# Patient Record
Sex: Male | Born: 1965 | Race: White | Hispanic: No | Smoking: Never smoker
Health system: Southern US, Community
[De-identification: ages and names within clinical notes are randomized; demographics above are authoritative.]

## PROBLEM LIST (undated history)

## (undated) DIAGNOSIS — R112 Nausea with vomiting, unspecified: Secondary | ICD-10-CM

## (undated) DIAGNOSIS — J40 Bronchitis, not specified as acute or chronic: Secondary | ICD-10-CM

## (undated) DIAGNOSIS — G971 Other reaction to spinal and lumbar puncture: Secondary | ICD-10-CM

## (undated) DIAGNOSIS — R079 Chest pain, unspecified: Secondary | ICD-10-CM

## (undated) DIAGNOSIS — M1990 Unspecified osteoarthritis, unspecified site: Secondary | ICD-10-CM

## (undated) DIAGNOSIS — I10 Essential (primary) hypertension: Secondary | ICD-10-CM

## (undated) DIAGNOSIS — M009 Pyogenic arthritis, unspecified: Secondary | ICD-10-CM

## (undated) DIAGNOSIS — G709 Myoneural disorder, unspecified: Secondary | ICD-10-CM

## (undated) DIAGNOSIS — T4145XA Adverse effect of unspecified anesthetic, initial encounter: Secondary | ICD-10-CM

## (undated) DIAGNOSIS — F419 Anxiety disorder, unspecified: Secondary | ICD-10-CM

## (undated) DIAGNOSIS — T7840XA Allergy, unspecified, initial encounter: Secondary | ICD-10-CM

## (undated) DIAGNOSIS — T8859XA Other complications of anesthesia, initial encounter: Secondary | ICD-10-CM

## (undated) DIAGNOSIS — Z9889 Other specified postprocedural states: Secondary | ICD-10-CM

## (undated) HISTORY — DX: Chest pain, unspecified: R07.9

## (undated) HISTORY — DX: Pyogenic arthritis, unspecified: M00.9

## (undated) HISTORY — PX: ULNAR NERVE REPAIR: SHX2594

## (undated) HISTORY — DX: Allergy, unspecified, initial encounter: T78.40XA

## (undated) HISTORY — PX: APPENDECTOMY: SHX54

## (undated) HISTORY — DX: Unspecified osteoarthritis, unspecified site: M19.90

## (undated) HISTORY — PX: SHOULDER SURGERY: SHX246

## (undated) HISTORY — PX: KNEE SURGERY: SHX244

## (undated) HISTORY — PX: CHOLECYSTECTOMY: SHX55

## (undated) NOTE — *Deleted (*Deleted)
CRITICAL VALUE ALERT  Critical Value: Hgb 6.5   Date & Time Notified:  01/20/19 4:35  Provider Notified: Shauna Hugh, MD  Orders Received/Actions taken:

---

## 2007-11-30 ENCOUNTER — Ambulatory Visit (HOSPITAL_BASED_OUTPATIENT_CLINIC_OR_DEPARTMENT_OTHER): Admission: RE | Admit: 2007-11-30 | Discharge: 2007-12-01 | Payer: Self-pay | Admitting: Orthopaedic Surgery

## 2007-12-09 ENCOUNTER — Inpatient Hospital Stay (HOSPITAL_COMMUNITY): Admission: EM | Admit: 2007-12-09 | Discharge: 2007-12-14 | Payer: Self-pay | Admitting: Emergency Medicine

## 2007-12-21 ENCOUNTER — Ambulatory Visit: Payer: Self-pay | Admitting: Internal Medicine

## 2010-07-30 NOTE — Op Note (Signed)
NAMEAMEL, GIANINO                ACCOUNT NO.:  [id number]   MEDICAL RECORD NO.:  [id number]          PATIENT TYPE:  INP   LOCATION:  5007                         FACILITY:  [facility]   PHYSICIAN:  Lubertha Basque. Dalldorf, M.D.DATE OF BIRTH:  [birth year]   DATE OF PROCEDURE:  12/10/2007  DATE OF DISCHARGE:                               OPERATIVE REPORT   PREOPERATIVE DIAGNOSIS:  Left shoulder infection.   POSTOPERATIVE DIAGNOSIS:  Left shoulder infection.   PROCEDURE:  Left shoulder arthroscopic incision and drainage.   ANESTHESIA:  General.   ATTENDING SURGEON:  Lubertha Basque. Jerl Santos, MD   ASSISTANT:  Lindwood Qua, PA   INDICATIONS FOR PROCEDURE:  The patient is a 45 year old man over a week  from a shoulder arthroscopy.  He was seen in the office for a few days  with rash and malaise.  He has been afebrile in the office with rash on  some days and no rash other days.  He has always maintained a good  motion of the shoulder, which never really appeared infected.  He  experienced fever at home yesterday and came to the emergency room.  There was suspicion of an infection and [name] placed a needle from  the posterior aspect of the shoulder and aspirated some material.  This  was sent to the lab and has shown white cells, but no organisms.  It is  presumed that he may have an infection and he is offered an irrigation  and debridement through the scope in hopes of taking care of this  problem quickly.  Informed operative consent was obtained after  discussion of possible complications of reaction to anesthesia and  obviously continued infection.   DESCRIPTION OF PROCEDURE:  The patient was taken to the OR suite where  general anesthesia was applied without complication.  He was positioned  in beach chair position and prepped and draped in normal sterile  fashion.  We then performed arthroscopy through his three old portals.  I found some pus in the subcutaneous position of the  posterior portal,  but the glenohumeral joint and subacromial spaces really seemed free of  infection.  Nevertheless, I irrigated with 6 liters of sterile solution.  We placed drains both glenohumeral and subacromial and these were  sutured in place.  He was admitted for appropriate postop care to  include continued vancomycin, which was started last night.  We would  also get an Infectious Disease consult to help in his management.  We  will also obviously monitor his cultures, which were taken last night at  admission.  I contacted his girlfriend, Leta Jungling, after the case at [phone].  We will also contact his rehabilitation nurse that she is aware,  though she likely is already abreast of the situation.     Lubertha Basque Jerl Santos, M.D.  Electronically Signed    PGD/MEDQ  D:  12/10/2007  T:  12/11/2007  Job:  [id number]

## 2010-07-30 NOTE — Discharge Summary (Signed)
NAMESTEWART, SASAKI                ACCOUNT NO.:  [id number]   MEDICAL RECORD NO.:  [id number]          PATIENT TYPE:  INP   LOCATION:  5007                         FACILITY:  [facility]   PHYSICIAN:  Lubertha Basque. Dalldorf, M.D.DATE OF BIRTH:  Nov 01, 1965   DATE OF ADMISSION:  12/09/2007  DATE OF DISCHARGE:  12/14/2007                               DISCHARGE SUMMARY   ADMITTING DIAGNOSIS:  Left shoulder status post arthroscopy with  infection.   DISCHARGE DIAGNOSIS:  Left shoulder status post arthroscopy with  infection.   OPERATIONS:  Incision and drainage of left shoulder incisions.   BRIEF HISTORY:  Mr. Jason [name] is a 45 year old white male patient who we had  operated on his left shoulder with an acromioplasty.  About a week prior  to his admission to this hospital, he was having increasing discomfort,  redness, pain, and swelling and presented to the emergency room at which  time, there was some pus that was aspirated from his shoulder, and we  have admitted him to the hospital for IV antibiotics and I&D treatment  of his left shoulder wound.   PERTINENT LABORATORY AND X-RAY FINDINGS:  WBCs 12 down from 16.3,  hemoglobin 13, and platelets 200.  Sodium 137, potassium 3.7, BUN 14,  creatinine 1.96, and glucose 116.  He was admitted to the hospital and  the next day taken to the operating room for I&D.  Postoperatively, we  placed him on a variety of p.o. and IM analgesics for pain, IV  vancomycin, and additional appropriate medications for nausea and muscle  spasm were given to the patient.  He had 2 large bore Hemovac drains in  place, were left in for 3 days, changed his dressing, and pulled the  drains on the third day.  Wounds were noted to be benign.  No sign of  infection at that point or irritation.  Dr. Orvan Falconer from Infectious  Disease consulted on the patient and had suggested either p.o. Avelox or  Levaquin on discharge.   His condition on discharge is improved.   FOLLOWUP:   Given 2 prescriptions, one for Avelox 400 mg 30 pills 1 p.o.  daily, continue his Xanax which was 0.25 one p.r.n., and Vicodin #60 one  p.o. q.4-6 h. p.r.n. pain.  Return to our office in 7 days for a  recheck, keep this area clean and dry with new dressings.  Low-sodium,  heart-healthy diet and continue in a sling.  Remain out of work.      Lindwood Qua, P.A.      Lubertha Basque Jerl Santos, M.D.  Electronically Signed   MC/MEDQ  D:  12/14/2007  T:  12/14/2007  Job:  [id number]

## 2010-07-30 NOTE — Op Note (Signed)
NAMETERRELL, OSTRAND                ACCOUNT NO.:  [id number]   MEDICAL RECORD NO.:  [id number]          PATIENT TYPE:  AMB   LOCATION:  DSC                          FACILITY:  [facility]   PHYSICIAN:  Lubertha Basque. Dalldorf, M.D.DATE OF BIRTH:  Aug 17, 1965   DATE OF PROCEDURE:  11/30/2007  DATE OF DISCHARGE:                               OPERATIVE REPORT   PREOPERATIVE DIAGNOSES:  1. Left shoulder impingement.  2. Left shoulder possible labral tear.   POSTOPERATIVE DIAGNOSES:  1. Left shoulder impingement.  2. Left shoulder labral tear.   PROCEDURE:  1. Left shoulder arthroscopic acromioplasty.  2. Left shoulder arthroscopic debridement.   ANESTHESIA:  General and block.   ATTENDING SURGEON:  Lubertha Basque. Jerl Santos, MD   ASSISTANT:  Lindwood Qua, PA   INDICATIONS FOR PROCEDURE:  The patient is a 45 year old man with a  history of an injury to his shoulder several years back.  This responded  to arthroscopic surgery elsewhere.  Unfortunately, he was involved on  the job in a physical encounter with the patient, who twisted his arm.  He has felt terrible pain since that time despite immobilization and a  sling, oral anti-inflammatories, and an injection.  By MRI scan, things  looked relatively benign.  Nevertheless, he persists with terrible pain  which makes it impossible for him to rest or use his arm or work.  He is  offered arthroscopic evaluation and treatment.  Informed operative  consent was obtained after discussion of possible complications of  reaction to anesthesia and infection.   SUMMARY FINDINGS AND PROCEDURE:  Under general anesthesia and a block, a  left shoulder arthroscopy was performed.  Glenohumeral joint showed no  degenerative changes and the rotator cuff appeared benign from below.  The subscapularis appeared benign.  The labral structures anterior-  posterior appeared completely benign and were both probed.  His superior  labrum appeared to have had a repair  performed.  It looked as though he  had one anchor on the anterior aspect of the superior labrum.  This  appeared to be Ethibond and seemed to have the labrum secured well to  the top of the glenoid.  This did not displace down into the joint.  There was some mild fraying and I addressed this with a debridement.  In  the subacromial space, he had some mild inflammation of the rotator cuff  and some partial-thickness tear, I addressed with a thorough  debridement.  I performed a revision acromioplasty removing a slightly  greater portion of bone.   DESCRIPTION OF PROCEDURE:  The patient was taken to the operating suite  where general anesthetic was applied without difficulty.  He was also  given a block in the preanesthesia area.  He was positioned in beach-  chair position and prepped, draped in normal sterile fashion.  After the  administration of IV Kefzol, an arthroscopy of left shoulder was  performed through a total of three portals.  Findings were as noted  above.  Procedure consisted of a thorough evaluation of his superior  labrum with a debridement of some torn  tissues.  This certainly was not  unstable and no further repair was required.  The rotator cuff was  examined quite closely from above and below, and no significant tear was  seen.  A brief debridement was done on the bursal aspect followed by a  slight revision acromioplasty.  The acromioplasty was revised with a bur  in the lateral position followed by transfer of the bur to posterior  position.  The shoulder was thoroughly irrigated followed by anesthesia  records.   DISPOSITION:  The patient was extubated in the operating room and taken  to recovery room in stable addition.  He is to go home on same day and  followup with me in the office next week.  I will contact him by phone  tonight.   ADDENDUM: Tommy had difficulty with pain and nausea and respiration and  subsequently was admitted for overnight observation.   He remained stable  throughout and I discharged him home in the morning.      Lubertha Basque Jerl Santos, M.D.  Electronically Signed     PGD/MEDQ  D:  11/30/2007  T:  12/01/2007  Job:  [id number]

## 2010-12-16 LAB — POCT I-STAT, CHEM 8
BUN: 21
Chloride: 106
Creatinine, Ser: 1.1
Potassium: 4.3
Sodium: 137
TCO2: 24

## 2010-12-16 LAB — BASIC METABOLIC PANEL
Calcium: 9.6
GFR calc non Af Amer: >60
Glucose, Bld: 116 — ABNORMAL HIGH
Potassium: 3.7
Sodium: 137

## 2010-12-16 LAB — VANCOMYCIN, TROUGH: Vancomycin Tr: <5 — ABNORMAL LOW

## 2010-12-16 LAB — BODY FLUID CULTURE: Culture: NO GROWTH

## 2010-12-16 LAB — DIFFERENTIAL
Basophils Absolute: 0.1
Eosinophils Relative: 1
Lymphocytes Relative: 10 — ABNORMAL LOW
Lymphs Abs: 1.6
Neutro Abs: 13.7 — ABNORMAL HIGH

## 2010-12-16 LAB — CBC
HCT: 47.3
MCHC: 33.7
Platelets: 200
Platelets: 224
RDW: 14.7
WBC: 16.3 — ABNORMAL HIGH

## 2010-12-16 LAB — POCT HEMOGLOBIN-HEMACUE: Hemoglobin: 15.3

## 2010-12-16 LAB — HIV ANTIBODY (ROUTINE TESTING W REFLEX): HIV: NONREACTIVE

## 2011-07-14 ENCOUNTER — Other Ambulatory Visit (HOSPITAL_COMMUNITY): Payer: Self-pay | Admitting: Orthopaedic Surgery

## 2011-07-15 ENCOUNTER — Encounter (HOSPITAL_COMMUNITY): Payer: Self-pay | Admitting: Pharmacy Technician

## 2011-07-18 ENCOUNTER — Other Ambulatory Visit (HOSPITAL_COMMUNITY): Payer: Self-pay | Admitting: Orthopaedic Surgery

## 2011-07-21 ENCOUNTER — Inpatient Hospital Stay (HOSPITAL_COMMUNITY): Admission: RE | Admit: 2011-07-21 | Payer: Self-pay | Source: Ambulatory Visit

## 2011-07-23 ENCOUNTER — Encounter (HOSPITAL_COMMUNITY)
Admission: RE | Admit: 2011-07-23 | Discharge: 2011-07-23 | Disposition: A | Discharge status: Home / Self Care | Payer: Self-pay | Source: Ambulatory Visit | Attending: Orthopaedic Surgery | Admitting: Orthopaedic Surgery

## 2011-07-23 ENCOUNTER — Encounter (HOSPITAL_COMMUNITY): Payer: Self-pay

## 2011-07-23 HISTORY — DX: Myoneural disorder, unspecified: G70.9

## 2011-07-23 HISTORY — DX: Other specified postprocedural states: R11.2

## 2011-07-23 HISTORY — DX: Other complications of anesthesia, initial encounter: T88.59XA

## 2011-07-23 HISTORY — DX: Bronchitis, not specified as acute or chronic: J40

## 2011-07-23 HISTORY — DX: Anxiety disorder, unspecified: F41.9

## 2011-07-23 HISTORY — DX: Adverse effect of unspecified anesthetic, initial encounter: T41.45XA

## 2011-07-23 HISTORY — DX: Other specified postprocedural states: Z98.890

## 2011-07-23 LAB — CBC
HCT: 44.6 % (ref 39.0–52.0)
MCH: 30.4 pg (ref 26.0–34.0)
MCV: 89.9 fL (ref 78.0–100.0)
Platelets: 289 10*3/uL (ref 150–400)
RDW: 13.9 % (ref 11.5–15.5)
WBC: 8.7 10*3/uL (ref 4.0–10.5)

## 2011-07-23 NOTE — Progress Notes (Deleted)
Contacted Dr. Carolynne Edouard III office spoke with Britta Mccreedy, reported critical potassium of 6.9.  Notified Revonda Standard of potassium level also requested Revonda Standard to review EKG from 06/29/11.

## 2011-07-23 NOTE — Pre-Procedure Instructions (Signed)
20 Jason [name]  07/23/2011   Your procedure is scheduled on:  Tuesday Jul 29, 2011  Report to [facility] at 1:00PM.  Call this number if you have problems the morning of surgery: [phone]   Remember:   Do not eat food:After Midnight.  May have clear liquids: up to 4 Hours before arrival. ( up to 9:00am)  Clear liquids include soda, tea, black coffee, apple or grape juice, broth.  Take these medicines the morning of surgery with A SIP OF WATER: xanax   Do not wear jewelry, make-up or nail polish.  Do not wear lotions, powders, or perfumes. You may wear deodorant.  Do not shave 48 hours prior to surgery.  Do not bring valuables to the hospital.  Contacts, dentures or bridgework may not be worn into surgery.  Leave suitcase in the car. After surgery it may be brought to your room.  For patients admitted to the hospital, checkout time is 11:00 AM the day of discharge.   Patients discharged the day of surgery will not be allowed to drive home.  Name and phone number of your driver: Tivis Ringer [phone]  Special Instructions: CHG Shower Use Special Wash: 1/2 bottle night before surgery and 1/2 bottle morning of surgery.   Please read over the following fact sheets that you were given: Pain Booklet, Coughing and Deep Breathing, MRSA Information and Surgical Site Infection Prevention

## 2011-07-28 MED ORDER — CLINDAMYCIN PHOSPHATE 600 MG/50ML IV SOLN
600.0000 mg | INTRAVENOUS | Status: AC
  Administered 2011-07-29: 600 mg via INTRAVENOUS
  Filled 2011-07-28 (×3): qty 50

## 2011-07-29 ENCOUNTER — Encounter (HOSPITAL_COMMUNITY)
Admission: RE | Disposition: A | Discharge status: Home / Self Care | Payer: Self-pay | Source: Ambulatory Visit | Attending: Orthopaedic Surgery

## 2011-07-29 ENCOUNTER — Ambulatory Visit (HOSPITAL_COMMUNITY): Payer: Self-pay | Admitting: Anesthesiology

## 2011-07-29 ENCOUNTER — Encounter (HOSPITAL_COMMUNITY): Payer: Self-pay | Admitting: *Deleted

## 2011-07-29 ENCOUNTER — Ambulatory Visit (HOSPITAL_COMMUNITY)
Admission: RE | Admit: 2011-07-29 | Discharge: 2011-07-29 | Disposition: A | Discharge status: Home / Self Care | Payer: Self-pay | Source: Ambulatory Visit | Attending: Orthopaedic Surgery | Admitting: Orthopaedic Surgery

## 2011-07-29 ENCOUNTER — Encounter (HOSPITAL_COMMUNITY): Payer: Self-pay | Admitting: Anesthesiology

## 2011-07-29 DIAGNOSIS — M25519 Pain in unspecified shoulder: Secondary | ICD-10-CM | POA: Insufficient documentation

## 2011-07-29 DIAGNOSIS — M7542 Impingement syndrome of left shoulder: Secondary | ICD-10-CM

## 2011-07-29 DIAGNOSIS — M25539 Pain in unspecified wrist: Secondary | ICD-10-CM | POA: Insufficient documentation

## 2011-07-29 DIAGNOSIS — G8929 Other chronic pain: Secondary | ICD-10-CM | POA: Insufficient documentation

## 2011-07-29 DIAGNOSIS — F411 Generalized anxiety disorder: Secondary | ICD-10-CM | POA: Insufficient documentation

## 2011-07-29 DIAGNOSIS — Z01812 Encounter for preprocedural laboratory examination: Secondary | ICD-10-CM | POA: Insufficient documentation

## 2011-07-29 DIAGNOSIS — Z9181 History of falling: Secondary | ICD-10-CM | POA: Insufficient documentation

## 2011-07-29 HISTORY — PX: SHOULDER ARTHROSCOPY: SHX128

## 2011-07-29 SURGERY — ARTHROSCOPY, SHOULDER
Anesthesia: General | Site: Shoulder | Laterality: Left | Wound class: Clean

## 2011-07-29 MED ORDER — GLYCOPYRROLATE 0.2 MG/ML IJ SOLN
INTRAMUSCULAR | Status: DC | PRN
  Administered 2011-07-29: .7 mg via INTRAVENOUS

## 2011-07-29 MED ORDER — METHOCARBAMOL 500 MG PO TABS: 500.0000 mg | ORAL_TABLET | Freq: Four times a day (QID) | ORAL | Status: AC | PRN

## 2011-07-29 MED ORDER — PHENYLEPHRINE HCL 10 MG/ML IJ SOLN
10.0000 mg | INTRAVENOUS | Status: DC | PRN
  Administered 2011-07-29: 20 ug/min via INTRAVENOUS

## 2011-07-29 MED ORDER — NEOSTIGMINE METHYLSULFATE 1 MG/ML IJ SOLN
INTRAMUSCULAR | Status: DC | PRN
  Administered 2011-07-29: 5 mg via INTRAVENOUS

## 2011-07-29 MED ORDER — LORAZEPAM 2 MG/ML IJ SOLN: 1.0000 mg | Freq: Once | INTRAMUSCULAR | Status: DC | PRN

## 2011-07-29 MED ORDER — ACETAMINOPHEN 10 MG/ML IV SOLN
INTRAVENOUS | Status: AC
  Filled 2011-07-29: qty 100

## 2011-07-29 MED ORDER — HYDROMORPHONE HCL PF 1 MG/ML IJ SOLN
INTRAMUSCULAR | Status: AC
  Filled 2011-07-29: qty 1

## 2011-07-29 MED ORDER — ACETAMINOPHEN 10 MG/ML IV SOLN
INTRAVENOUS | Status: DC | PRN
  Administered 2011-07-29: 1000 mg via INTRAVENOUS

## 2011-07-29 MED ORDER — PROMETHAZINE HCL 25 MG/ML IJ SOLN
6.2500 mg | INTRAMUSCULAR | Status: DC | PRN
  Administered 2011-07-29: 6.25 mg via INTRAVENOUS

## 2011-07-29 MED ORDER — ROCURONIUM BROMIDE 100 MG/10ML IV SOLN
INTRAVENOUS | Status: DC | PRN
  Administered 2011-07-29: 50 mg via INTRAVENOUS

## 2011-07-29 MED ORDER — FENTANYL CITRATE 0.05 MG/ML IJ SOLN
INTRAMUSCULAR | Status: AC
  Filled 2011-07-29: qty 2

## 2011-07-29 MED ORDER — MIDAZOLAM HCL 5 MG/5ML IJ SOLN
INTRAMUSCULAR | Status: DC | PRN
  Administered 2011-07-29: 2 mg via INTRAVENOUS

## 2011-07-29 MED ORDER — PHENYLEPHRINE HCL 10 MG/ML IJ SOLN: 10.0000 mg | INTRAMUSCULAR | Status: DC | PRN

## 2011-07-29 MED ORDER — OXYCODONE-ACETAMINOPHEN 5-325 MG PO TABS: 1.0000 | ORAL_TABLET | ORAL | Status: AC | PRN

## 2011-07-29 MED ORDER — BUPIVACAINE-EPINEPHRINE PF 0.5-1:200000 % IJ SOLN
INTRAMUSCULAR | Status: DC | PRN
  Administered 2011-07-29: 25 mL

## 2011-07-29 MED ORDER — OXYCODONE-ACETAMINOPHEN 5-325 MG PO TABS
ORAL_TABLET | ORAL | Status: AC
  Administered 2011-07-29: 1 via ORAL
  Filled 2011-07-29: qty 2

## 2011-07-29 MED ORDER — PROMETHAZINE HCL 12.5 MG PO TABS: 25.0000 mg | ORAL_TABLET | Freq: Four times a day (QID) | ORAL | Status: DC | PRN

## 2011-07-29 MED ORDER — ALBUTEROL SULFATE HFA 108 (90 BASE) MCG/ACT IN AERS
INHALATION_SPRAY | RESPIRATORY_TRACT | Status: DC | PRN
  Administered 2011-07-29: 4 via RESPIRATORY_TRACT

## 2011-07-29 MED ORDER — FENTANYL CITRATE 0.05 MG/ML IJ SOLN
INTRAMUSCULAR | Status: DC | PRN
  Administered 2011-07-29 (×2): 100 ug via INTRAVENOUS

## 2011-07-29 MED ORDER — LACTATED RINGERS IV SOLN
INTRAVENOUS | Status: DC | PRN
  Administered 2011-07-29: 14:00:00 via INTRAVENOUS

## 2011-07-29 MED ORDER — PROMETHAZINE HCL 25 MG/ML IJ SOLN
INTRAMUSCULAR | Status: AC
  Filled 2011-07-29: qty 1

## 2011-07-29 MED ORDER — FENTANYL CITRATE 0.05 MG/ML IJ SOLN
50.0000 ug | INTRAMUSCULAR | Status: DC | PRN
  Administered 2011-07-29: 100 ug via INTRAVENOUS

## 2011-07-29 MED ORDER — 0.9 % SODIUM CHLORIDE (POUR BTL) OPTIME
TOPICAL | Status: DC | PRN
  Administered 2011-07-29: 1000 mL

## 2011-07-29 MED ORDER — PROMETHAZINE HCL 25 MG/ML IJ SOLN
6.2500 mg | Freq: Once | INTRAMUSCULAR | Status: AC
  Administered 2011-07-29: 6.25 mg via INTRAVENOUS

## 2011-07-29 MED ORDER — HYDROMORPHONE HCL PF 1 MG/ML IJ SOLN
0.2500 mg | INTRAMUSCULAR | Status: DC | PRN
  Administered 2011-07-29 (×4): 0.5 mg via INTRAVENOUS

## 2011-07-29 MED ORDER — PROPOFOL 10 MG/ML IV EMUL
INTRAVENOUS | Status: DC | PRN
  Administered 2011-07-29: 200 mg via INTRAVENOUS

## 2011-07-29 MED ORDER — SODIUM CHLORIDE 0.9 % IR SOLN
Status: DC | PRN
  Administered 2011-07-29: 3000 mL

## 2011-07-29 MED ORDER — MIDAZOLAM HCL 2 MG/2ML IJ SOLN
INTRAMUSCULAR | Status: AC
  Filled 2011-07-29: qty 2

## 2011-07-29 MED ORDER — METHYLPREDNISOLONE ACETATE 40 MG/ML IJ SUSP
INTRAMUSCULAR | Status: DC | PRN
  Administered 2011-07-29: 16:00:00 via INTRA_ARTICULAR

## 2011-07-29 MED ORDER — OXYCODONE-ACETAMINOPHEN 5-325 MG PO TABS
2.0000 | ORAL_TABLET | Freq: Once | ORAL | Status: AC
  Administered 2011-07-29: 1 via ORAL

## 2011-07-29 MED ORDER — LACTATED RINGERS IV SOLN
INTRAVENOUS | Status: DC
  Administered 2011-07-29: 14:00:00 via INTRAVENOUS

## 2011-07-29 MED ORDER — DEXAMETHASONE SODIUM PHOSPHATE 4 MG/ML IJ SOLN
INTRAMUSCULAR | Status: DC | PRN
  Administered 2011-07-29: 4 mg

## 2011-07-29 MED ORDER — PROMETHAZINE HCL 25 MG/ML IJ SOLN
12.5000 mg | Freq: Once | INTRAMUSCULAR | Status: AC
  Administered 2011-07-29: 6.25 mg via INTRAVENOUS

## 2011-07-29 MED ORDER — MIDAZOLAM HCL 2 MG/2ML IJ SOLN
1.0000 mg | INTRAMUSCULAR | Status: DC | PRN
  Administered 2011-07-29: 2 mg via INTRAVENOUS

## 2011-07-29 SURGICAL SUPPLY — 50 items
BLADE CUDA 4.2 (BLADE) ×2 IMPLANT
BLADE SURG 11 STRL SS (BLADE) ×2 IMPLANT
BLADE SURG ROTATE 9660 (MISCELLANEOUS) IMPLANT
BUR VERTEX HOODED 4.5 (BURR) IMPLANT
CANNULA 5.75X7 CRYSTAL CLEAR (CANNULA) IMPLANT
CANNULA SHOULDER 7CM (CANNULA) ×4 IMPLANT
CANNULA TWIST IN 8.25X7CM (CANNULA) IMPLANT
CLOTH BEACON ORANGE TIMEOUT ST (SAFETY) ×2 IMPLANT
COVER SURGICAL LIGHT HANDLE (MISCELLANEOUS) ×2 IMPLANT
DECANTER SPIKE VIAL GLASS SM (MISCELLANEOUS) IMPLANT
DRAPE INCISE IOBAN 66X45 STRL (DRAPES) IMPLANT
DRAPE SHOULDER BEACH CHAIR (DRAPES) ×2 IMPLANT
DRAPE SURG 17X23 STRL (DRAPES) ×2 IMPLANT
DRAPE U-SHAPE 47X51 STRL (DRAPES) ×2 IMPLANT
DRSG PAD ABDOMINAL 8X10 ST (GAUZE/BANDAGES/DRESSINGS) ×2 IMPLANT
DURAPREP 26ML APPLICATOR (WOUND CARE) ×2 IMPLANT
ELECT REM PT RETURN 9FT ADLT (ELECTROSURGICAL)
ELECTRODE REM PT RTRN 9FT ADLT (ELECTROSURGICAL) IMPLANT
GAUZE XEROFORM 1X8 LF (GAUZE/BANDAGES/DRESSINGS) ×2 IMPLANT
GLOVE BIOGEL PI IND STRL 8 (GLOVE) ×1 IMPLANT
GLOVE BIOGEL PI INDICATOR 8 (GLOVE) ×1
GLOVE ORTHO TXT STRL SZ7.5 (GLOVE) ×2 IMPLANT
GOWN PREVENTION PLUS LG XLONG (DISPOSABLE) IMPLANT
GOWN PREVENTION PLUS XLARGE (GOWN DISPOSABLE) ×2 IMPLANT
GOWN STRL NON-REIN LRG LVL3 (GOWN DISPOSABLE) ×4 IMPLANT
KIT BASIN OR (CUSTOM PROCEDURE TRAY) ×2 IMPLANT
KIT ROOM TURNOVER OR (KITS) ×2 IMPLANT
KIT SHOULDER TRACTION (DRAPES) ×2 IMPLANT
MANIFOLD NEPTUNE II (INSTRUMENTS) ×2 IMPLANT
NEEDLE 1/2 CIR CATGUT .05X1.09 (NEEDLE) IMPLANT
NEEDLE HYPO 25GX1X1/2 BEV (NEEDLE) IMPLANT
NEEDLE SCORPION (NEEDLE) IMPLANT
NEEDLE SPNL 18GX3.5 QUINCKE PK (NEEDLE) ×2 IMPLANT
NS IRRIG 1000ML POUR BTL (IV SOLUTION) ×2 IMPLANT
PACK SHOULDER (CUSTOM PROCEDURE TRAY) ×2 IMPLANT
PAD ARMBOARD 7.5X6 YLW CONV (MISCELLANEOUS) ×4 IMPLANT
SET ARTHROSCOPY TUBING (MISCELLANEOUS) ×1
SET ARTHROSCOPY TUBING LN (MISCELLANEOUS) ×1 IMPLANT
SLING ARM FOAM STRAP LRG (SOFTGOODS) ×2 IMPLANT
SPONGE GAUZE 4X4 12PLY (GAUZE/BANDAGES/DRESSINGS) ×2 IMPLANT
SPONGE LAP 4X18 X RAY DECT (DISPOSABLE) ×2 IMPLANT
STAPLER VISISTAT 35W (STAPLE) IMPLANT
STRIP CLOSURE SKIN 1/2X4 (GAUZE/BANDAGES/DRESSINGS) IMPLANT
SUT ETHILON 3 0 PS 1 (SUTURE) IMPLANT
SYR CONTROL 10ML LL (SYRINGE) IMPLANT
TAPE CLOTH SURG 4X10 WHT LF (GAUZE/BANDAGES/DRESSINGS) ×2 IMPLANT
TOWEL OR 17X24 6PK STRL BLUE (TOWEL DISPOSABLE) ×2 IMPLANT
TOWEL OR 17X26 10 PK STRL BLUE (TOWEL DISPOSABLE) ×2 IMPLANT
WAND 90 DEG TURBOVAC W/CORD (SURGICAL WAND) ×2 IMPLANT
WATER STERILE IRR 1000ML POUR (IV SOLUTION) ×2 IMPLANT

## 2011-07-29 NOTE — Op Note (Signed)
Jason [name], Jason [name]                ACCOUNT NO.:  [id number]  MEDICAL RECORD NO.:  [id number]  LOCATION:  [facility]                         FACILITY:  [facility]  PHYSICIAN:  Vanita Panda. Magnus Ivan, M.D.DATE OF BIRTH:  10-29-1965  DATE OF PROCEDURE:  07/29/2011 DATE OF DISCHARGE:  07/29/2011                              OPERATIVE REPORT   PREOPERATIVE DIAGNOSES: 1. Severe left shoulder pain. 2. Severe left wrist pain.  POSTOPERATIVE DIAGNOSES: 1. Severe left shoulder pain. 2. Severe left wrist pain.  PROCEDURES: 1. Left shoulder arthroscopy with minimal debridement. 2. Steroid injection into the left wrist joint.  GRAFT FINDINGS:  Minimal inflamed tissue in the left shoulder.  SURGEON:  Vanita Panda. Magnus Ivan, MD  ANESTHESIA: 1. Regional left shoulder block. 2. General.  BLOOD LOSS:  Minimal.  COMPLICATIONS:  None.  INDICATION:  Jason [name] is a 46 year old gentleman with chronic left shoulder pain and left wrist pain.  He has had multiple falls, injured these.  He has also had multiple surgeries on his left shoulder from original subacromial decompression and labral repair that got infected years ago and then had multiple debridements following this.  He comes then to the office with a very stiff shoulder with severe pain and limited mobility and limited use of the shoulder as well as the wrist. X-rays did not show any significant findings, being uninsured, he could not afford MRI or other things he has had is with general as well as pain control, he wished to go with an arthroscopic debridement.  I understand given multiple infections in the past, certainly he develop scar tissue, but also noted that this could be his pain syndrome and I explained this to him as well and he understood what the findings may be.  PROCEDURE DESCRIPTION:  After informed consent was obtained, appropriate left shoulder and left wrist were marked.  He was brought to the operating room after a  shoulder block was obtained by Anesthesia.  He was then placed supine on the operating table and general anesthesia was obtained.  Once that was obtained, a time-out was called to identify correct right wrist and correct left shoulder.  I then prepped the wrist with Betadine and alcohol prior to injection of 2 mL of 0.25% plain Marcaine with a 1 mL of Depo-Medrol.  His left shoulder was prepped and draped as well, and he was placed in a beach-chair position with appropriate positioning of the head and neck and padding of the operative right arm.  His left arm was placed in in-line skeletal traction with 10 pounds of traction, neutral rotation and 45 degrees of forward flexion.  I then made a posterior arthroscopy portal through previous incision, entered the glenohumeral joint easily.  There was no effusion encountered at all.  On review inside of the glenohumeral joint, I found minimal degenerative fraying of the superior labrum, but the sutures were intact from his previous labral repair and there was minimal inflamed tissue around the rotator cuff and that was definitely intact.  I made an anterior portal in the rotator interval and used a soft tissue ablation wand, I carried out a minimal debridement of inflamed tissue in the glenohumeral joint.  Next, I entered the subacromial space of the posterior portal and made a separate lateral portal.  I found minimal tendinosis of the rotator cuff and adequate subacromial space from previous surgeries.  The rotator cuff itself was intact.  There was minimal changes at the [facility] joint.  Using a soft tissue ablation wand, I carried out a minimal debridement in the subacromial space.  I then removed all instrumentation and closed the 3 portal sites plate, placed well-padded sterile dressing, and then placed him in a sling.  He was awakened, extubated, and taken to the recovery room in stable condition.  All final counts were correct.  There were  no complications noted.  Of note, prior to surgery, I did move the shoulder out completely and obtained full motion with no evidence of adhesions on clinical exam with him asleep and there was an adequate shoulder block. Postoperatively, we will have to discussed in length his pain control and rehabilitating his shoulder and go on from there.     Vanita Panda. Magnus Ivan, M.D.     CYB/MEDQ  D:  07/29/2011  T:  07/29/2011  Job:  [id number]

## 2011-07-29 NOTE — Brief Op Note (Signed)
07/29/2011  4:09 PM  PATIENT:  [place] C[place]  46 y.o. male  PRE-OPERATIVE DIAGNOSIS:  Severe left shoulder impingement and pain, left wrist pain  POST-OPERATIVE DIAGNOSIS:  Severe left shoulder pain, left wrist pain  PROCEDURE:  Procedure(s) (LRB): ARTHROSCOPY SHOULDER (Left)  SURGEON:  Surgeon(s) and Role:    * Kathryne Hitch, MD - Primary  PHYSICIAN ASSISTANT:   ASSISTANTS: none   ANESTHESIA:   regional and general  EBL:     BLOOD ADMINISTERED:none  DRAINS: none   LOCAL MEDICATIONS USED:  NONE  SPECIMEN:  No Specimen  DISPOSITION OF SPECIMEN:  N/A  COUNTS:  YES  TOURNIQUET:  * No tourniquets in log *  DICTATION: .Other Dictation: Dictation Number [id number]  PLAN OF CARE: Discharge to home after PACU  PATIENT DISPOSITION:  PACU - hemodynamically stable.   Delay start of Pharmacological VTE agent (>24hrs) due to surgical blood loss or risk of bleeding: not applicable

## 2011-07-29 NOTE — Anesthesia Procedure Notes (Addendum)
Anesthesia Regional Block:  Interscalene brachial plexus block  Pre-Anesthetic Checklist: ,, timeout performed, Correct Patient, Correct Site, Correct Laterality, Correct Procedure, Correct Position, site marked, Risks and benefits discussed,  Surgical consent,  Pre-op evaluation,  At surgeon's request and post-op pain management  Laterality: Left  Prep: chloraprep       Needles:   Needle Type: Stimulator Needle - 40      Needle Gauge: 22 and 22 G    Additional Needles:  Procedures: nerve stimulator Interscalene brachial plexus block  Nerve Stimulator or Paresthesia:  Response: 0.48 mA,   Additional Responses:   Narrative:  Start time: 07/29/2011 2:27 PM End time: 07/29/2011 2:40 PM Injection made incrementally with aspirations every 5 mL. Anesthesiologist: Dr Gypsy Balsam  Additional Notes: 1427-1440 L ISB POP CHG prep, sterile tech Heavy IV sedation #22 stim needle w/stim down to .48ma Multiple neg asp Marc .5% w/1:200000 epi total 25cc+ Decsdron 4mg  No compl-tolerated well Dr Gypsy Balsam   Procedure Name: Intubation Date/Time: 07/29/2011 3:00 PM Performed by: Ellin Goodie Pre-anesthesia Checklist: Patient identified, Emergency Drugs available, Suction available, Patient being monitored and Timeout performed Patient Re-evaluated:Patient Re-evaluated prior to inductionOxygen Delivery Method: Circle system utilized Preoxygenation: Pre-oxygenation with 100% oxygen Intubation Type: IV induction Ventilation: Mask ventilation without difficulty Laryngoscope Size: Mac and 4 Grade View: Grade II Tube type: Oral Number of attempts: 3 Airway Equipment and Method: Stylet and Video-laryngoscopy Placement Confirmation: ETT inserted through vocal cords under direct vision,  positive ETCO2 and breath sounds checked- equal and bilateral Secured at: 23 cm Tube secured with: Tape Dental Injury: Teeth and Oropharynx as per pre-operative assessment  Difficulty Due To: Difficulty was  anticipated Future Recommendations: Recommend- induction with short-acting agent, and alternative techniques readily available Comments: Attempted DL with MAC 4 blade x 2 Grade III view.  Esophageal intubation noted.  Glidescope available in room.  Grade I view with glidescope, passed easily through cords.  + ETCO2, BBSE.  Breathing treatment initiated.  Carlynn Herald, CRNA

## 2011-07-29 NOTE — Anesthesia Preprocedure Evaluation (Addendum)
Anesthesia Evaluation  Patient identified by MRN, date of birth, ID band Patient awake    Reviewed: Allergy & Precautions, H&P , NPO status , Patient's Chart, lab work & pertinent test results, reviewed documented beta blocker date and time   History of Anesthesia Complications (+) PONV  Airway  TM Distance: >3 FB Neck ROM: Full    Dental  (+) Teeth Intact   Pulmonary  breath sounds clear to auscultation  Pulmonary exam normal       Cardiovascular Exercise Tolerance: Good Rhythm:Regular Rate:Normal     Neuro/Psych  Neuromuscular disease    GI/Hepatic Pt nauseated preop before medications-states secondary to no po intake today   Endo/Other    Renal/GU      Musculoskeletal   Abdominal (+)  Abdomen: soft. Bowel sounds: normal.  Peds  Hematology   Anesthesia Other Findings   Reproductive/Obstetrics                        Anesthesia Physical Anesthesia Plan  ASA: II  Anesthesia Plan: General   Post-op Pain Management:    Induction: Intravenous  Airway Management Planned: Oral ETT  Additional Equipment:   Intra-op Plan:   Post-operative Plan: Extubation in OR  Informed Consent: I have reviewed the patients History and Physical, chart, labs and discussed the procedure including the risks, benefits and alternatives for the proposed anesthesia with the patient or authorized representative who has indicated his/her understanding and acceptance.     Plan Discussed with: CRNA and Surgeon  Anesthesia Plan Comments:         Anesthesia Quick Evaluation

## 2011-07-29 NOTE — Transfer of Care (Signed)
Immediate Anesthesia Transfer of Care Note  Patient: Jason [name]  Procedure(s) Performed: Procedure(s) (LRB): ARTHROSCOPY SHOULDER (Left)  Patient Location: PACU  Anesthesia Type: General  Level of Consciousness: awake and sedated  Airway & Oxygen Therapy: Patient Spontanous Breathing and Patient connected to face mask oxygen  Post-op Assessment: Report given to PACU RN, Post -op Vital signs reviewed and stable and Patient moving all extremities  Post vital signs: Reviewed and stable  Complications: No apparent anesthesia complications

## 2011-07-29 NOTE — H&P (Signed)
Jason [name] is an 46 y.o. male.   Chief Complaint:   1) severe left shoulder pain, 2) severe left wrist pain HPI:   46 yo male with chronic pain in his left shoulder and left wrist.  The left wrist has been injured before, and the left shoulder has had multiple surgeries remotely.  He has complications of infection involving his shoulder in the past.  With time, it has stiffened up quite a bit and has worsening pain.  His wrist has gotten worse as well.  He understands fully the risks and benefits of surgery.  Past Medical History  Diagnosis Date  . Complication of anesthesia   . PONV (postoperative nausea and vomiting)   . Bronchitis   . Neuromuscular disorder     carpal tunnel bilateral, ulner nerve surgery  . Anxiety     Past Surgical History  Procedure Date  . Knee surgery     7 knee surgeries on right, and 4 knee surgeries left  . Shoulder surgery     3 surgeries on right, 2 surgeries on left  . Ulnar nerve repair   . Appendectomy   . Cholecystectomy     History reviewed. No pertinent family history. Social History:  reports that he has never smoked. He does not have any smokeless tobacco history on file. He reports that he does not drink alcohol or use illicit drugs.  Allergies:  Allergies  Allergen Reactions  . Ivp Dye (Iodinated Diagnostic Agents) Anaphylaxis  . Reglan (Metoclopramide) Anaphylaxis  . Penicillins Rash  . Vancomycin Rash  . Zofran (Ondansetron Hcl) Rash    Medications Prior to Admission  Medication Sig Dispense Refill  . acetaminophen (TYLENOL) 500 MG tablet Take 500 mg by mouth every 6 (six) hours as needed.      . ALPRAZolam (XANAX) 1 MG tablet Take 1 mg by mouth 3 (three) times daily as needed. anxiety      . zolpidem (AMBIEN) 10 MG tablet Take 10 mg by mouth at bedtime as needed. sleep        No results found for this or any previous visit (from the past 48 hour(s)). No results found.  Review of Systems  All other systems reviewed and are  negative.    Blood pressure 153/99, pulse 95, temperature 98.4 F (36.9 C), temperature source Oral, resp. rate 20, SpO2 96.00%. Physical Exam  Constitutional: He is oriented to person, place, and time. He appears well-developed and well-nourished.  HENT:  Head: Normocephalic and atraumatic.  Eyes: EOM are normal. Pupils are equal, round, and reactive to light.  Neck: Normal range of motion. Neck supple.  Cardiovascular: Normal rate and regular rhythm.   Respiratory: Effort normal and breath sounds normal.  GI: Soft. Bowel sounds are normal.  Musculoskeletal:       Left shoulder: He exhibits decreased range of motion and tenderness.       Left wrist: He exhibits decreased range of motion and tenderness.  Neurological: He is alert and oriented to person, place, and time.  Skin: Skin is warm and dry.  Psychiatric: He has a normal mood and affect.     Assessment/Plan To the OR today for a left shoulder arthroscopy with debridement and a steroid injection into his left wrist.  Sandor Arboleda Y 07/29/2011, 2:15 PM

## 2011-07-29 NOTE — Progress Notes (Signed)
Per Delorise Shiner RN, pt wants to go home and his friend is here and in agreement.

## 2011-07-30 ENCOUNTER — Encounter (HOSPITAL_COMMUNITY): Payer: Self-pay | Admitting: Orthopaedic Surgery

## 2011-07-30 NOTE — Anesthesia Postprocedure Evaluation (Signed)
  Anesthesia Post-op Note  Patient: Jason [name]  Procedure(s) Performed: Procedure(s) (LRB): ARTHROSCOPY SHOULDER (Left)  Patient discharged prior to post-operative visit.  No apparent anesthetic complications.

## 2011-10-21 ENCOUNTER — Other Ambulatory Visit: Payer: Self-pay | Admitting: Orthopedic Surgery

## 2011-10-22 MED ORDER — SODIUM CHLORIDE 0.9 % IV SOLN
6.0000 mg/kg | Freq: Once | INTRAVENOUS | Status: DC
  Filled 2011-10-22: qty 12.71

## 2011-10-23 ENCOUNTER — Encounter (HOSPITAL_COMMUNITY): Payer: Self-pay | Admitting: *Deleted

## 2011-10-23 ENCOUNTER — Encounter (HOSPITAL_COMMUNITY): Payer: Self-pay | Admitting: Pharmacy Technician

## 2011-10-23 MED ORDER — CHLORHEXIDINE GLUCONATE 4 % EX LIQD: 60.0000 mL | Freq: Once | CUTANEOUS | Status: DC

## 2011-10-23 MED ORDER — DEXTROSE-NACL 5-0.45 % IV SOLN: INTRAVENOUS | Status: DC

## 2011-10-23 NOTE — H&P (Signed)
Jason [name] is an 46 y.o. male.   Chief Complaint: Right knee pain HPI: Patient presents today with chief complaint of right medial knee pain, which has been worsening over the past 2-3 weeks.  At this time the patient is having difficulty sleeping due to the pain.  Patient has had a total of 7 operations since his work related injury on 01/25/2010 with last surgery 02/21/2011.  The pain is worsened by walking, during which he gets a grinding sensation.  Patient reports some swelling of his knee at the end of the day.  Patient takes ibuprofen occasionally for the pain but states he does not gain full relief.  Patient denies buckling or locking.  He was given a cortisone shot on 09/11/11 that provided him with a few hours of good pain relief.  Past Medical History  Diagnosis Date  . Complication of anesthesia   . PONV (postoperative nausea and vomiting)   . Bronchitis   . Neuromuscular disorder     carpal tunnel bilateral, ulner nerve surgery  . Anxiety   . Spinal headache     "long time ago"    Past Surgical History  Procedure Date  . Knee surgery     7 knee surgeries on right, and 4 knee surgeries left  . Shoulder surgery     3 surgeries on right, 2 surgeries on left  . Ulnar nerve repair   . Appendectomy   . Cholecystectomy   . Shoulder arthroscopy 07/29/2011    Procedure: ARTHROSCOPY SHOULDER;  Surgeon: Kathryne Hitch, MD;  Location: [facility] OR;  Service: Orthopedics;  Laterality: Left;  Left shoulder arthroscopy with minimal debridement, left wrist steroid injection    History reviewed. No pertinent family history. Social History:  reports that he has never smoked. He does not have any smokeless tobacco history on file. He reports that he does not drink alcohol or use illicit drugs.  Allergies:  Allergies  Allergen Reactions  . Ivp Dye (Iodinated Diagnostic Agents) Anaphylaxis  . Reglan (Metoclopramide) Anaphylaxis  . Codeine Hives  . Lidocaine Other (See Comments)   Burnt skin  Maybe?  . Penicillins Rash  . Zofran (Ondansetron Hcl) Rash    No prescriptions prior to admission    No results found for this or any previous visit (from the past 48 hour(s)). No results found.  Review of Systems  Constitutional: Negative.   HENT: Negative.   Eyes: Negative.   Respiratory: Negative.   Cardiovascular: Negative.   Gastrointestinal: Negative.   Musculoskeletal: Positive for joint pain.  Skin: Negative.   Neurological: Negative.   Endo/Heme/Allergies: Negative.   Psychiatric/Behavioral: Negative.     Height 5\' 10"  (1.778 m), weight 99.791 kg (220 lb). Physical Exam  Constitutional: He is oriented to person, place, and time. He appears well-developed and well-nourished. No distress.  HENT:  Head: Normocephalic.  Eyes: Pupils are equal, round, and reactive to light.  Neck: Neck supple.  Cardiovascular: Normal rate and regular rhythm.  Exam reveals no gallop and no friction rub.   No murmur heard. Respiratory: Breath sounds normal.  GI: Bowel sounds are normal.  Musculoskeletal: He exhibits tenderness. He exhibits no edema.  Neurological: He is alert and oriented to person, place, and time.  Skin: Skin is dry. He is not diaphoretic.  Psychiatric: He has a normal mood and affect.     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  The last one was done in December of 2012.  His opinion is that he is having the same problem he had back in December, which is possible, especially since he has recurrent grinding and catching.  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  Jason [name] M. 10/23/2011, 5:48 PM

## 2011-10-24 ENCOUNTER — Encounter (HOSPITAL_COMMUNITY): Payer: Self-pay | Admitting: Anesthesiology

## 2011-10-24 ENCOUNTER — Ambulatory Visit (HOSPITAL_COMMUNITY): Payer: Self-pay | Admitting: Anesthesiology

## 2011-10-24 ENCOUNTER — Encounter (HOSPITAL_COMMUNITY)
Admission: RE | Disposition: A | Discharge status: Home / Self Care | Payer: Self-pay | Source: Ambulatory Visit | Attending: Orthopedic Surgery

## 2011-10-24 ENCOUNTER — Ambulatory Visit (HOSPITAL_COMMUNITY)
Admission: RE | Admit: 2011-10-24 | Discharge: 2011-10-24 | Disposition: A | Discharge status: Home / Self Care | Payer: Self-pay | Source: Ambulatory Visit | Attending: Orthopedic Surgery | Admitting: Orthopedic Surgery

## 2011-10-24 ENCOUNTER — Encounter (HOSPITAL_COMMUNITY): Payer: Self-pay | Admitting: *Deleted

## 2011-10-24 DIAGNOSIS — F411 Generalized anxiety disorder: Secondary | ICD-10-CM | POA: Insufficient documentation

## 2011-10-24 DIAGNOSIS — M2240 Chondromalacia patellae, unspecified knee: Secondary | ICD-10-CM | POA: Insufficient documentation

## 2011-10-24 DIAGNOSIS — S83206A Unspecified tear of unspecified meniscus, current injury, right knee, initial encounter: Secondary | ICD-10-CM

## 2011-10-24 HISTORY — DX: Other reaction to spinal and lumbar puncture: G97.1

## 2011-10-24 HISTORY — PX: KNEE ARTHROSCOPY: SHX127

## 2011-10-24 LAB — CBC
Hemoglobin: 15.1 g/dL (ref 13.0–17.0)
Platelets: 252 10*3/uL (ref 150–400)
RBC: 4.97 MIL/uL (ref 4.22–5.81)
WBC: 7.8 10*3/uL (ref 4.0–10.5)

## 2011-10-24 SURGERY — ARTHROSCOPY, KNEE
Anesthesia: General | Site: Knee | Laterality: Right | Wound class: Clean

## 2011-10-24 MED ORDER — ONDANSETRON HCL 4 MG/2ML IJ SOLN
4.0000 mg | Freq: Three times a day (TID) | INTRAMUSCULAR | Status: AC | PRN
  Administered 2011-10-24: 4 mg via INTRAVENOUS

## 2011-10-24 MED ORDER — PROPOFOL 10 MG/ML IV EMUL
INTRAVENOUS | Status: DC | PRN
  Administered 2011-10-24: 200 mg via INTRAVENOUS

## 2011-10-24 MED ORDER — ONDANSETRON HCL 4 MG/2ML IJ SOLN
INTRAMUSCULAR | Status: AC
  Administered 2011-10-24: 4 mg via INTRAVENOUS
  Filled 2011-10-24: qty 2

## 2011-10-24 MED ORDER — SUCCINYLCHOLINE CHLORIDE 20 MG/ML IJ SOLN
INTRAMUSCULAR | Status: DC | PRN
  Administered 2011-10-24: 100 mg via INTRAVENOUS

## 2011-10-24 MED ORDER — SODIUM CHLORIDE 0.9 % IV SOLN
6.0000 mg/kg | Freq: Once | INTRAVENOUS | Status: AC
  Administered 2011-10-24: 635.5 mg via INTRAVENOUS
  Filled 2011-10-24: qty 12.71

## 2011-10-24 MED ORDER — LIDOCAINE HCL (CARDIAC) 20 MG/ML IV SOLN
INTRAVENOUS | Status: DC | PRN
  Administered 2011-10-24: 75 mg via INTRAVENOUS

## 2011-10-24 MED ORDER — SCOPOLAMINE 1 MG/3DAYS TD PT72
1.0000 | MEDICATED_PATCH | TRANSDERMAL | Status: DC
  Administered 2011-10-24: 1.5 mg via TRANSDERMAL

## 2011-10-24 MED ORDER — MIDAZOLAM HCL 5 MG/5ML IJ SOLN
INTRAMUSCULAR | Status: DC | PRN
  Administered 2011-10-24: 150 mg via INTRAVENOUS

## 2011-10-24 MED ORDER — ACETAMINOPHEN 10 MG/ML IV SOLN
1000.0000 mg | Freq: Once | INTRAVENOUS | Status: AC
  Administered 2011-10-24: 1000 mg via INTRAVENOUS

## 2011-10-24 MED ORDER — PROMETHAZINE HCL 25 MG/ML IJ SOLN
INTRAMUSCULAR | Status: AC
  Administered 2011-10-24: 6.25 mg via INTRAVENOUS
  Filled 2011-10-24: qty 1

## 2011-10-24 MED ORDER — KETOROLAC TROMETHAMINE 30 MG/ML IJ SOLN
30.0000 mg | Freq: Once | INTRAMUSCULAR | Status: AC
  Administered 2011-10-24: 30 mg via INTRAVENOUS

## 2011-10-24 MED ORDER — HYDROMORPHONE HCL PF 1 MG/ML IJ SOLN
INTRAMUSCULAR | Status: AC
  Administered 2011-10-24: 0.5 mg via INTRAVENOUS
  Filled 2011-10-24: qty 1

## 2011-10-24 MED ORDER — SODIUM CHLORIDE 0.9 % IR SOLN
Status: DC | PRN
  Administered 2011-10-24: 6000 mL

## 2011-10-24 MED ORDER — LACTATED RINGERS IV SOLN
INTRAVENOUS | Status: DC
  Administered 2011-10-24: 09:00:00 via INTRAVENOUS

## 2011-10-24 MED ORDER — PROMETHAZINE HCL 25 MG/ML IJ SOLN
6.2500 mg | INTRAMUSCULAR | Status: AC | PRN
  Administered 2011-10-24 (×2): 6.25 mg via INTRAVENOUS

## 2011-10-24 MED ORDER — MIDAZOLAM HCL 2 MG/2ML IJ SOLN
INTRAMUSCULAR | Status: AC
  Filled 2011-10-24: qty 2

## 2011-10-24 MED ORDER — MIDAZOLAM HCL 2 MG/2ML IJ SOLN
1.0000 mg | INTRAMUSCULAR | Status: DC | PRN
  Administered 2011-10-24: 2 mg via INTRAVENOUS

## 2011-10-24 MED ORDER — MUPIROCIN 2 % EX OINT
TOPICAL_OINTMENT | CUTANEOUS | Status: AC
  Administered 2011-10-24: 1 via NASAL
  Filled 2011-10-24: qty 22

## 2011-10-24 MED ORDER — MEPERIDINE HCL 25 MG/ML IJ SOLN: 6.2500 mg | INTRAMUSCULAR | Status: DC | PRN

## 2011-10-24 MED ORDER — HYDROCODONE-ACETAMINOPHEN 5-325 MG PO TABS: 1.0000 | ORAL_TABLET | Freq: Four times a day (QID) | ORAL | Status: AC | PRN

## 2011-10-24 MED ORDER — DOXYCYCLINE HYCLATE 100 MG PO TABS: 100.0000 mg | ORAL_TABLET | Freq: Two times a day (BID) | ORAL | Status: AC

## 2011-10-24 MED ORDER — LACTATED RINGERS IV SOLN
INTRAVENOUS | Status: DC | PRN
  Administered 2011-10-24 (×2): via INTRAVENOUS

## 2011-10-24 MED ORDER — KETOROLAC TROMETHAMINE 30 MG/ML IJ SOLN
INTRAMUSCULAR | Status: AC
  Administered 2011-10-24: 30 mg via INTRAVENOUS
  Filled 2011-10-24: qty 1

## 2011-10-24 MED ORDER — PROMETHAZINE HCL 25 MG/ML IJ SOLN
12.5000 mg | Freq: Once | INTRAMUSCULAR | Status: AC
  Administered 2011-10-24: 12.5 mg via INTRAVENOUS

## 2011-10-24 MED ORDER — HYDROMORPHONE HCL PF 1 MG/ML IJ SOLN
0.2500 mg | INTRAMUSCULAR | Status: DC | PRN
  Administered 2011-10-24 (×4): 0.5 mg via INTRAVENOUS

## 2011-10-24 MED ORDER — MIDAZOLAM HCL 2 MG/2ML IJ SOLN: 0.5000 mg | Freq: Once | INTRAMUSCULAR | Status: DC | PRN

## 2011-10-24 MED ORDER — ACETAMINOPHEN 10 MG/ML IV SOLN
INTRAVENOUS | Status: AC
  Administered 2011-10-24: 1000 mg via INTRAVENOUS
  Filled 2011-10-24: qty 100

## 2011-10-24 MED ORDER — MUPIROCIN 2 % EX OINT
TOPICAL_OINTMENT | Freq: Once | CUTANEOUS | Status: AC
  Administered 2011-10-24: 1 via NASAL

## 2011-10-24 MED ORDER — ONDANSETRON HCL 4 MG/2ML IJ SOLN
INTRAMUSCULAR | Status: DC | PRN
  Administered 2011-10-24: 4 mg via INTRAVENOUS

## 2011-10-24 SURGICAL SUPPLY — 27 items
BANDAGE ELASTIC 6 VELCRO ST LF (GAUZE/BANDAGES/DRESSINGS) ×2 IMPLANT
BLADE CUDA 5.5 (BLADE) IMPLANT
BLADE CUTTER GATOR 3.5 (BLADE) ×2 IMPLANT
BLADE GREAT WHITE 4.2 (BLADE) IMPLANT
BUR OVAL 6.0 (BURR) IMPLANT
CLOTH BEACON ORANGE TIMEOUT ST (SAFETY) ×2 IMPLANT
DRAPE ARTHROSCOPY W/POUCH 114 (DRAPES) ×2 IMPLANT
DURAPREP 26ML APPLICATOR (WOUND CARE) ×2 IMPLANT
GAUZE XEROFORM 1X8 LF (GAUZE/BANDAGES/DRESSINGS) ×2 IMPLANT
GLOVE BIO SURGEON STRL SZ7 (GLOVE) ×2 IMPLANT
GLOVE BIO SURGEON STRL SZ7.5 (GLOVE) ×2 IMPLANT
GLOVE BIOGEL PI IND STRL 7.0 (GLOVE) ×1 IMPLANT
GLOVE BIOGEL PI IND STRL 8 (GLOVE) ×1 IMPLANT
GLOVE BIOGEL PI INDICATOR 7.0 (GLOVE) ×1
GLOVE BIOGEL PI INDICATOR 8 (GLOVE) ×1
GOWN STRL NON-REIN LRG LVL3 (GOWN DISPOSABLE) ×6 IMPLANT
KIT BASIN OR (CUSTOM PROCEDURE TRAY) ×2 IMPLANT
KIT ROOM TURNOVER OR (KITS) ×2 IMPLANT
MANIFOLD NEPTUNE II (INSTRUMENTS) ×2 IMPLANT
PACK ARTHROSCOPY DSU (CUSTOM PROCEDURE TRAY) ×2 IMPLANT
PAD ARMBOARD 7.5X6 YLW CONV (MISCELLANEOUS) ×4 IMPLANT
SET ARTHROSCOPY TUBING (MISCELLANEOUS) ×1
SET ARTHROSCOPY TUBING LN (MISCELLANEOUS) ×1 IMPLANT
SPONGE GAUZE 4X4 12PLY (GAUZE/BANDAGES/DRESSINGS) ×2 IMPLANT
SPONGE LAP 4X18 X RAY DECT (DISPOSABLE) ×2 IMPLANT
TOWEL OR 17X24 6PK STRL BLUE (TOWEL DISPOSABLE) ×4 IMPLANT
WATER STERILE IRR 1000ML POUR (IV SOLUTION) ×2 IMPLANT

## 2011-10-24 NOTE — Anesthesia Preprocedure Evaluation (Signed)
Anesthesia Evaluation  Patient identified by MRN, date of birth, ID band Patient awake    Reviewed: Allergy & Precautions, H&P , NPO status , Patient's Chart, lab work & pertinent test results  History of Anesthesia Complications (+) PONV  Airway Mallampati: II TM Distance: >3 FB Neck ROM: Full    Dental No notable dental hx. (+) Teeth Intact and Dental Advisory Given   Pulmonary neg pulmonary ROS,  breath sounds clear to auscultation  Pulmonary exam normal       Cardiovascular negative cardio ROS  Rhythm:Regular Rate:Normal     Neuro/Psych Anxiety negative neurological ROS     GI/Hepatic negative GI ROS, Neg liver ROS,   Endo/Other  Morbid obesity  Renal/GU negative Renal ROS     Musculoskeletal   Abdominal (+) + obese,   Peds  Hematology   Anesthesia Other Findings   Reproductive/Obstetrics                           Anesthesia Physical Anesthesia Plan  ASA: I  Anesthesia Plan: General   Post-op Pain Management:    Induction: Intravenous  Airway Management Planned: Oral ETT  Additional Equipment:   Intra-op Plan:   Post-operative Plan: Extubation in OR  Informed Consent: I have reviewed the patients History and Physical, chart, labs and discussed the procedure including the risks, benefits and alternatives for the proposed anesthesia with the patient or authorized representative who has indicated his/her understanding and acceptance.   Dental advisory given  Plan Discussed with: CRNA and Surgeon  Anesthesia Plan Comments: (Plan routine monitors, GETA)        Anesthesia Quick Evaluation

## 2011-10-24 NOTE — Transfer of Care (Signed)
Immediate Anesthesia Transfer of Care Note  Patient: Jason [name]  Procedure(s) Performed: Procedure(s) (LRB): ARTHROSCOPY KNEE (Right)  Patient Location: PACU  Anesthesia Type: General  Level of Consciousness: sedated  Airway & Oxygen Therapy: Patient Spontanous Breathing and Patient connected to nasal cannula oxygen  Post-op Assessment: Report given to PACU RN and Post -op Vital signs reviewed and stable  Post vital signs: Reviewed and stable  Complications: No apparent anesthesia complications

## 2011-10-24 NOTE — Anesthesia Procedure Notes (Signed)
Procedure Name: Intubation Date/Time: 10/24/2011 10:13 AM Performed by: Gwenyth Allegra Pre-anesthesia Checklist: Emergency Drugs available, Timeout performed, Patient identified, Suction available and Patient being monitored Patient Re-evaluated:Patient Re-evaluated prior to inductionOxygen Delivery Method: Circle system utilized Preoxygenation: Pre-oxygenation with 100% oxygen Intubation Type: IV induction Laryngoscope Size: Mac and 3 Grade View: Grade II Tube size: 8.0 mm Airway Equipment and Method: Stylet and Patient positioned with wedge pillow Placement Confirmation: ETT inserted through vocal cords under direct vision,  breath sounds checked- equal and bilateral and positive ETCO2 Secured at: 22 cm Tube secured with: Tape Dental Injury: Teeth and Oropharynx as per pre-operative assessment

## 2011-10-24 NOTE — Interval H&P Note (Signed)
History and Physical Interval Note:  10/24/2011 9:42 AM  Jason [name]  has presented today for surgery, with the diagnosis of RIGHT KNEE MEDIAL MENISCAL TEAR VS CHONDROMALACIA  The various methods of treatment have been discussed with the patient and family. After consideration of risks, benefits and other options for treatment, the patient has consented to  Procedure(s) (LRB): ARTHROSCOPY KNEE (Right) as a surgical intervention .  The patient's history has been reviewed, patient examined, no change in status, stable for surgery.  I have reviewed the patient's chart and labs.  Questions were answered to the patient's satisfaction.     Nestor Lewandowsky

## 2011-10-24 NOTE — Op Note (Signed)
Pre-Op Dx: Chondromalacia patella right knee  Postop Dx: Same   Procedure: Debridement chondromalacia, medial facet right knee grade 3 with flap tear, medial tibial plateau grade 2-3.  Surgeon: Feliberto Gottron. Turner Daniels M.D.  Assist: Shirl Harris PA-C  Anes: General LMA  EBL: Minimal  Fluids: 800 cc   Indications: Patient reports catching popping and pain in his right knee which is undergone 3 prior arthroscopies 2 by another physician and one by me. His last knee scope was year ago initially he did well but he has had recurrence of catching popping and pain in his right knee in the peripatellar region. Pt has failed conservative treatment with anti-inflammatory medicines, physical therapy, and modified activites but did get good temporarily from an intra-articular cortisone injection. Pain has recurred and patient desires elective arthroscopic evaluation and treatment of knee. Risks and benefits of surgery have been discussed and questions answered.  Procedure: Patient identified by arm band and taken to the operating room at the day surgery Center. The appropriate anesthetic monitors were attached, and General LMA anesthesia was induced without difficulty. Lateral post was applied to the table and the lower extremity was prepped and draped in usual sterile fashion from the ankle to the midthigh. Time out procedure was performed. We began the operation by making standard inferior lateral and inferior medial peripatellar portals with a #11 blade allowing introduction of the arthroscope through the inferior lateral portal and the out flow to the inferior medial portal. Pump pressure was set at 100 mmHg and diagnostic arthroscopy  revealed grade 3 chondromalacia medial facet of the patella this is really back to a stable margin with a 3.5 mm Gator sucker shaver. The trochlea also had some chondromalacia unchanged from his prior arthroscopy there is a small tear the anterior horn of the medial meniscus was  debrided as well as some incidental debridement of the medial tibial plateau grade 2 chondromalacia. The anterior cruciate ligament and PCL are intact. The lateral compartment was pristine. The posterior horns of both menisci were thoroughly probed. The knee was irrigated out normal saline solution. A dressing of xerofoam 4 x 4 dressing sponges, web roll and an Ace wrap was applied. The patient was awakened extubated and taken to the recovery without difficulty.    Signed: Nestor Lewandowsky, MD

## 2011-10-24 NOTE — Anesthesia Postprocedure Evaluation (Signed)
  Anesthesia Post-op Note  Patient: Jason [name]  Procedure(s) Performed: Procedure(s) (LRB): ARTHROSCOPY KNEE (Right)  Patient Location: PACU  Anesthesia Type: General  Level of Consciousness: awake, alert  and oriented  Airway and Oxygen Therapy: Patient Spontanous Breathing  Post-op Pain: mild  Post-op Assessment: Post-op Vital signs reviewed, Patient's Cardiovascular Status Stable, Respiratory Function Stable, Patent Airway, No signs of Nausea or vomiting and Pain level controlled  Post-op Vital Signs: Reviewed and stable  Complications: No apparent anesthesia complications

## 2011-10-27 ENCOUNTER — Encounter (HOSPITAL_COMMUNITY): Payer: Self-pay | Admitting: Orthopedic Surgery

## 2011-11-25 ENCOUNTER — Encounter (HOSPITAL_COMMUNITY): Payer: Self-pay | Admitting: General Practice

## 2011-11-25 ENCOUNTER — Other Ambulatory Visit: Payer: Self-pay | Admitting: Orthopedic Surgery

## 2011-11-25 ENCOUNTER — Inpatient Hospital Stay (HOSPITAL_COMMUNITY)
Admission: AD | Admit: 2011-11-25 | Discharge: 2011-12-01 | DRG: 486 | Disposition: A | Discharge status: Home / Self Care | Payer: Self-pay | Source: Ambulatory Visit | Attending: Orthopedic Surgery | Admitting: Orthopedic Surgery

## 2011-11-25 DIAGNOSIS — Y998 Other external cause status: Secondary | ICD-10-CM

## 2011-11-25 DIAGNOSIS — L0293 Carbuncle, unspecified: Secondary | ICD-10-CM | POA: Diagnosis present

## 2011-11-25 DIAGNOSIS — M1990 Unspecified osteoarthritis, unspecified site: Secondary | ICD-10-CM | POA: Diagnosis present

## 2011-11-25 DIAGNOSIS — Y92009 Unspecified place in unspecified non-institutional (private) residence as the place of occurrence of the external cause: Secondary | ICD-10-CM

## 2011-11-25 DIAGNOSIS — L03119 Cellulitis of unspecified part of limb: Secondary | ICD-10-CM | POA: Diagnosis not present

## 2011-11-25 DIAGNOSIS — X58XXXA Exposure to other specified factors, initial encounter: Secondary | ICD-10-CM | POA: Diagnosis present

## 2011-11-25 DIAGNOSIS — M009 Pyogenic arthritis, unspecified: Principal | ICD-10-CM | POA: Diagnosis present

## 2011-11-25 DIAGNOSIS — F411 Generalized anxiety disorder: Secondary | ICD-10-CM | POA: Diagnosis present

## 2011-11-25 DIAGNOSIS — L02419 Cutaneous abscess of limb, unspecified: Secondary | ICD-10-CM | POA: Diagnosis not present

## 2011-11-25 DIAGNOSIS — S83289A Other tear of lateral meniscus, current injury, unspecified knee, initial encounter: Secondary | ICD-10-CM | POA: Diagnosis present

## 2011-11-25 DIAGNOSIS — L0292 Furuncle, unspecified: Secondary | ICD-10-CM | POA: Diagnosis not present

## 2011-11-25 DIAGNOSIS — Z880 Allergy status to penicillin: Secondary | ICD-10-CM

## 2011-11-25 LAB — CBC
Hemoglobin: 14.4 g/dL (ref 13.0–17.0)
MCHC: 34.2 g/dL (ref 30.0–36.0)
RBC: 4.73 MIL/uL (ref 4.22–5.81)
WBC: 10.7 10*3/uL — ABNORMAL HIGH (ref 4.0–10.5)

## 2011-11-25 LAB — PROTIME-INR
INR: 1.05 (ref 0.00–1.49)
Prothrombin Time: 13.9 seconds (ref 11.6–15.2)

## 2011-11-25 LAB — BASIC METABOLIC PANEL
BUN: 14 mg/dL (ref 6–23)
Chloride: 103 mEq/L (ref 96–112)
GFR calc Af Amer: >90 mL/min (ref >90)
GFR calc non Af Amer: >90 mL/min (ref >90)
Potassium: 3.8 mEq/L (ref 3.5–5.1)
Sodium: 138 mEq/L (ref 135–145)

## 2011-11-25 LAB — URINALYSIS, ROUTINE W REFLEX MICROSCOPIC
Bilirubin Urine: NEGATIVE
Leukocytes, UA: NEGATIVE
Nitrite: NEGATIVE
Specific Gravity, Urine: 1.019 (ref 1.005–1.030)
Urobilinogen, UA: 0.2 mg/dL (ref 0.0–1.0)

## 2011-11-25 MED ORDER — DIPHENHYDRAMINE HCL 12.5 MG/5ML PO ELIX
12.5000 mg | ORAL_SOLUTION | Freq: Four times a day (QID) | ORAL | Status: DC | PRN
  Administered 2011-11-25: 25 mg via ORAL
  Filled 2011-11-25: qty 5

## 2011-11-25 MED ORDER — VANCOMYCIN HCL IN DEXTROSE 1-5 GM/200ML-% IV SOLN
1000.0000 mg | Freq: Once | INTRAVENOUS | Status: AC
  Administered 2011-11-25: 1000 mg via INTRAVENOUS
  Filled 2011-11-25: qty 200

## 2011-11-25 MED ORDER — OXYCODONE HCL 5 MG PO TABS
5.0000 mg | ORAL_TABLET | ORAL | Status: DC | PRN
  Administered 2011-11-25: 5 mg via ORAL
  Filled 2011-11-25: qty 1

## 2011-11-25 MED ORDER — OXYCODONE HCL 5 MG PO TABS
10.0000 mg | ORAL_TABLET | ORAL | Status: DC | PRN
  Administered 2011-11-25: 5 mg via ORAL
  Administered 2011-11-26 – 2011-11-28 (×4): 10 mg via ORAL
  Filled 2011-11-25 (×4): qty 2
  Filled 2011-11-25: qty 1
  Filled 2011-11-25: qty 2

## 2011-11-25 MED ORDER — METHOCARBAMOL 500 MG PO TABS
500.0000 mg | ORAL_TABLET | Freq: Four times a day (QID) | ORAL | Status: DC | PRN
  Administered 2011-11-25 – 2011-12-01 (×6): 500 mg via ORAL
  Filled 2011-11-25 (×7): qty 1

## 2011-11-25 MED ORDER — KCL IN DEXTROSE-NACL 20-5-0.45 MEQ/L-%-% IV SOLN
INTRAVENOUS | Status: DC
  Administered 2011-11-25 – 2011-11-27 (×2): 20 mL/h via INTRAVENOUS
  Administered 2011-11-30: 1000 mL via INTRAVENOUS
  Administered 2011-11-30: 20 mL/h via INTRAVENOUS
  Filled 2011-11-25 (×5): qty 1000

## 2011-11-25 MED ORDER — SODIUM CHLORIDE 0.9 % IJ SOLN: 10.0000 mL | INTRAMUSCULAR | Status: DC | PRN

## 2011-11-25 MED ORDER — ACETAMINOPHEN 650 MG RE SUPP: 650.0000 mg | Freq: Four times a day (QID) | RECTAL | Status: DC | PRN

## 2011-11-25 MED ORDER — ONDANSETRON HCL 4 MG/2ML IJ SOLN
4.0000 mg | Freq: Four times a day (QID) | INTRAMUSCULAR | Status: DC | PRN
  Administered 2011-11-25 – 2011-11-28 (×7): 4 mg via INTRAVENOUS
  Filled 2011-11-25 (×7): qty 2

## 2011-11-25 MED ORDER — HYDROMORPHONE HCL PF 1 MG/ML IJ SOLN
1.0000 mg | INTRAMUSCULAR | Status: DC | PRN
  Administered 2011-11-25 – 2011-11-27 (×17): 2 mg via INTRAVENOUS
  Administered 2011-11-27 (×4): 1 mg via INTRAVENOUS
  Administered 2011-11-27 – 2011-11-28 (×6): 2 mg via INTRAVENOUS
  Filled 2011-11-25 (×5): qty 2
  Filled 2011-11-25: qty 1
  Filled 2011-11-25 (×2): qty 2
  Filled 2011-11-25: qty 1
  Filled 2011-11-25 (×4): qty 2
  Filled 2011-11-25: qty 1
  Filled 2011-11-25 (×13): qty 2

## 2011-11-25 MED ORDER — LORATADINE 10 MG PO TABS
10.0000 mg | ORAL_TABLET | Freq: Every day | ORAL | Status: DC | PRN
  Filled 2011-11-25: qty 1

## 2011-11-25 MED ORDER — ACETAMINOPHEN 325 MG PO TABS
650.0000 mg | ORAL_TABLET | Freq: Four times a day (QID) | ORAL | Status: DC | PRN
  Administered 2011-11-26 – 2011-11-27 (×4): 650 mg via ORAL
  Filled 2011-11-25 (×5): qty 2

## 2011-11-25 MED ORDER — ZOLPIDEM TARTRATE 10 MG PO TABS
10.0000 mg | ORAL_TABLET | Freq: Every day | ORAL | Status: DC
  Administered 2011-11-25 – 2011-11-30 (×6): 10 mg via ORAL
  Filled 2011-11-25 (×6): qty 1

## 2011-11-25 MED ORDER — VANCOMYCIN HCL IN DEXTROSE 1-5 GM/200ML-% IV SOLN
1000.0000 mg | Freq: Three times a day (TID) | INTRAVENOUS | Status: DC
  Administered 2011-11-25 – 2011-12-01 (×18): 1000 mg via INTRAVENOUS
  Filled 2011-11-25 (×21): qty 200

## 2011-11-25 MED ORDER — DIPHENHYDRAMINE HCL 50 MG/ML IJ SOLN
12.5000 mg | Freq: Four times a day (QID) | INTRAMUSCULAR | Status: DC | PRN
  Administered 2011-11-26 – 2011-11-27 (×2): 25 mg via INTRAVENOUS
  Filled 2011-11-25 (×2): qty 1

## 2011-11-25 MED ORDER — HYDROMORPHONE HCL PF 1 MG/ML IJ SOLN
1.0000 mg | INTRAMUSCULAR | Status: DC | PRN
  Filled 2011-11-25: qty 1

## 2011-11-25 MED ORDER — ALPRAZOLAM 0.5 MG PO TABS
3.0000 mg | ORAL_TABLET | Freq: Three times a day (TID) | ORAL | Status: DC | PRN
  Administered 2011-11-25: 3 mg via ORAL
  Administered 2011-11-26: 1 mg via ORAL
  Filled 2011-11-25 (×2): qty 6

## 2011-11-25 NOTE — Progress Notes (Signed)
Chief complaint:.  Swollen painful right knee for the last 2-3 days with fevers up to 102.6.  History of present illness: Patient had arthroscopic removal of chondromalacia patella with flap tears and debridement of degenerative tearing of medial meniscus 10/24/2011.  He did well until about 9 days ago when he slipped and fell and the knee popped.  He developed and not with a bruise along the posterior medial aspect of the knee.  He also had the onset of pain.  3 days ago he developed swelling in the knee and over the weekend had fevers up to 102.6.  Previously has had a MRSA infection in the knee prior to when I met him in March of 2012.  He also had an infection in the shoulder after surgery in 2009.  At his last surgery.  We did prophylax him with vancomycin.  And he initially did well.  He has had 3 or 4 arthroscopic procedures in the right knee, the first few were done in [place] with Dr. Marlyne Beards under Worker's Compensation.  His comp case is now settled.  Review of systems: Denies any shortness of breath, chest pains, or systemic illness he is able to ambulate on his own.  Past medical history: Unchanged from last surgery.  PHYSICAL EXAM: Well-developed, well-nourished.  Awake, alert, and oriented x3.  Extraocular motion is intact.  No use of accessory respiratory muscles for breathing.   Cardiovascular exam reveals a regular rhythm.  Skin is intact without cuts, scrapes, or abrasions. 46 year old man seated on the examination table in no obvious distress.  The right knee has a 2+ effusion.  Range of motion is from 15-90.  It is warm to touch.  After discussing options with the patient.  The right knee was sterilely prepped with alcohol, anesthetized with ethyl chloride and I aspirated out 20 cc of purulent appearing material.  A Band-Aid was applied.  He tolerated the procedure well.  He is neurovascular intact and can move his feet up and down without any trouble at all.  He does ambulate  with a limp.  He has no positive lymph nodes in the groin.  Assessment: Probable infected right knee with date of surgery being date 12/05/11, was doing well until he slipped and fell 9 days ago landing on his knee with a pop and a medial bruise.  No real fluid aspirated today.  Plan: He will be admitted to the hospital for IV vancomycin, we'll await the results of the Gram stain, cell count and cultures of the right knee.  If this is indeed infected.  He's a candidate for arthroscopic washout of the knee and placement of deep drains.  Risks and benefits of the surgery discussed at length with the patient.

## 2011-11-25 NOTE — Progress Notes (Signed)
ANTIBIOTIC CONSULT NOTE - INITIAL  Pharmacy Consult for Vancomycin Indication: suspected knee infection  Allergies  Allergen Reactions  . Ivp Dye (Iodinated Diagnostic Agents) Anaphylaxis  . Reglan (Metoclopramide) Anaphylaxis  . Codeine Hives  . Lidocaine Other (See Comments)    Burnt skin  Maybe?  . Penicillins Rash  . Zofran (Ondansetron Hcl) Rash    Patient Measurements: Height: 5\' 10"  (177.8 cm) Weight: 215 lb (97.523 kg) IBW/kg (Calculated) : 73   Vital Signs: Temp: 98.5 F (36.9 C) (09/10 1157) BP: 135/87 mmHg (09/10 1157) Pulse Rate: 107  (09/10 1157) Intake/Output from previous day:   Intake/Output from this shift:    Labs:  Basename 11/25/11 1145  WBC 10.7*  HGB 14.4  PLT 258  LABCREA --  CREATININE 0.92   Estimated Creatinine Clearance: 121.3 ml/min (by C-G formula based on Cr of 0.92). No results found for this basename: VANCOTROUGH:2,VANCOPEAK:2,VANCORANDOM:2,GENTTROUGH:2,GENTPEAK:2,GENTRANDOM:2,TOBRATROUGH:2,TOBRAPEAK:2,TOBRARND:2,AMIKACINPEAK:2,AMIKACINTROU:2,AMIKACIN:2, in the last 72 hours   Microbiology: No results found for this or any previous visit (from the past 720 hour(s)).  Medical History: Past Medical History  Diagnosis Date  . Complication of anesthesia   . PONV (postoperative nausea and vomiting)   . Bronchitis   . Neuromuscular disorder     carpal tunnel bilateral, ulner nerve surgery  . Anxiety   . Spinal headache     "long time ago"   Assessment: 45 YOM with previous hx of MRSA knee infection (March 2012), s/p right knee arthroscopy on 8/9, fell 9 days ago and developed swelling and fever. Pt. Is admitted for possible washout. Pharmacy is consulted to start vancomycin. Scr 0.92, est. crcl > 100  Goal of Therapy:  Vancomycin trough level 15-20 mcg/ml  Plan:  - Vancomycin 1g IV Q 8hrs - f/u renal function and cultures if available - Vancomycin trough at steady state.  Bayard Hugger, PharmD, BCPS  Clinical Pharmacist    Pager: [phone]  11/25/2011,1:35 PM  FYI: Pt. With documented allergy to codeine (hives), and zofran (rash). Oxycodone and zofran was ordered for the patient. I spoke with him. He said he tolerated both oxycodone and zofran before. Orders were verified, zofran is removed from drug allergy list.

## 2011-11-25 NOTE — Progress Notes (Signed)
Peripherally Inserted Central Catheter/Midline Placement  The IV Nurse has discussed with the patient and/or persons authorized to consent for the patient, the purpose of this procedure and the potential benefits and risks involved with this procedure.  The benefits include less needle sticks, lab draws from the catheter and patient may be discharged home with the catheter.  Risks include, but not limited to, infection, bleeding, blood clot (thrombus formation), and puncture of an artery; nerve damage and irregular heat beat.  Alternatives to this procedure were also discussed.  PICC/Midline Placement Documentation        Jason [name] 11/25/2011, 6:19 PM

## 2011-11-25 NOTE — H&P (Signed)
Jason [name] is an 46 y.o. male.   Chief Complaint: swollen painful knee HPI: Patient had arthroscopic removal of chondromalacia patella with flap tears and debridement of degenerative tearing of medial meniscus 10/24/2011.  He did well until about 9 days ago when he slipped and fell and the knee popped.  He developed and not with a bruise along the posterior medial aspect of the knee.  He also had the onset of pain.  3 days ago he developed swelling in the knee and over the weekend had fevers up to 102.6.  Previously has had a MRSA infection in the knee prior to when I met him in March of 2012.  He also had an infection in the shoulder after surgery in 2009.  At his last surgery.  We did prophylax him with vancomycin.  And he initially did well.  He has had 3 or 4 arthroscopic procedures in the right knee, the first few were done in [place] with Dr. Marlyne Beards under Worker's Compensation.  His comp case is now settled.  Past Medical History  Diagnosis Date  . Complication of anesthesia   . PONV (postoperative nausea and vomiting)   . Bronchitis   . Neuromuscular disorder     carpal tunnel bilateral, ulner nerve surgery  . Anxiety   . Spinal headache     "long time ago"    Past Surgical History  Procedure Date  . Knee surgery     7 knee surgeries on right, and 4 knee surgeries left  . Shoulder surgery     3 surgeries on right, 2 surgeries on left  . Ulnar nerve repair   . Appendectomy   . Cholecystectomy   . Shoulder arthroscopy 07/29/2011    Procedure: ARTHROSCOPY SHOULDER;  Surgeon: Kathryne Hitch, MD;  Location: [facility] OR;  Service: Orthopedics;  Laterality: Left;  Left shoulder arthroscopy with minimal debridement, left wrist steroid injection  . Knee arthroscopy 10/24/2011    Procedure: ARTHROSCOPY KNEE;  Surgeon: Nestor Lewandowsky, MD;  Location: [facility] OR;  Service: Orthopedics;  Laterality: Right;    No family history on file. Social History:  reports that he has never smoked. He  has never used smokeless tobacco. He reports that he does not drink alcohol or use illicit drugs.  Allergies:  Allergies  Allergen Reactions  . Ivp Dye (Iodinated Diagnostic Agents) Anaphylaxis  . Reglan (Metoclopramide) Anaphylaxis  . Codeine Hives  . Lidocaine Other (See Comments)    Burnt skin  Maybe?  . Penicillins Rash  . Zofran (Ondansetron Hcl) Rash    Medications Prior to Admission  Medication Sig Dispense Refill  . ALPRAZolam (XANAX) 1 MG tablet Take 0.5-1.5 mg by mouth 3 (three) times daily as needed. For anxiety      . Multiple Vitamin (MULTIVITAMIN WITH MINERALS) TABS Take 2 tablets by mouth daily.      Marland Kitchen zolpidem (AMBIEN) 10 MG tablet Take 10 mg by mouth at bedtime as needed. To help sleep        Results for orders placed during the hospital encounter of 11/25/11 (from the past 48 hour(s))  CBC     Status: Abnormal   Collection Time   11/25/11 11:45 AM      Component Value Range Comment   WBC 10.7 (*) 4.0 - 10.5 K/uL    RBC 4.73  4.22 - 5.81 MIL/uL    Hemoglobin 14.4  13.0 - 17.0 g/dL    HCT 16.1  09.6 - 04.5 %  MCV 89.0  78.0 - 100.0 fL    MCH 30.4  26.0 - 34.0 pg    MCHC 34.2  30.0 - 36.0 g/dL    RDW 40.9  81.1 - 91.4 %    Platelets 258  150 - 400 K/uL   PROTIME-INR     Status: Normal   Collection Time   11/25/11 11:45 AM      Component Value Range Comment   Prothrombin Time 13.9  11.6 - 15.2 seconds    INR 1.05  0.00 - 1.49    No results found.  Review of Systems  Constitutional: Positive for fever and malaise/fatigue.  HENT: Negative.   Eyes: Negative.   Respiratory: Negative.   Cardiovascular: Negative.   Gastrointestinal: Negative.   Genitourinary: Negative.   Musculoskeletal: Positive for joint pain.  Skin: Negative.   Neurological: Negative.   Endo/Heme/Allergies: Negative.   Psychiatric/Behavioral: Negative.     There were no vitals taken for this visit. Physical Exam  Constitutional: He is oriented to person, place, and time. He  appears well-developed and well-nourished.  Eyes: Pupils are equal, round, and reactive to light.  Cardiovascular: Normal heart sounds.   Respiratory: Breath sounds normal.  Neurological: He is alert and oriented to person, place, and time.    The right knee has a 2+ effusion.  Range of motion is from 15-90.  It is warm to touch.  After discussing options with the patient.  The right knee was sterilely prepped with alcohol, anesthetized with ethyl chloride and I aspirated out 20 cc of purulent appearing material.  A Band-Aid was applied.  He tolerated the procedure well.  He is neurovascular intact and can move his feet up and down without any trouble at all.  He does ambulate with a limp.  He has no positive lymph nodes in the groin.  Assessment/Plan Assessment: Probable infected right knee with date of surgery being date 12/05/11, was doing well until he slipped and fell 9 days ago landing on his knee with a pop and a medial bruise.  No real fluid aspirated today.  Plan: He will be admitted to the hospital for IV vancomycin, we'll await the results of the Gram stain, cell count and cultures of the right knee.  If this is indeed infected.  He's a candidate for arthroscopic washout of the knee and placement of deep drains.  Risks and benefits of the surgery discussed at length with the patient.   Geraldyn Shain M. 11/25/2011, 1:09 PM

## 2011-11-26 ENCOUNTER — Inpatient Hospital Stay (HOSPITAL_COMMUNITY): Admission: RE | Admit: 2011-11-26 | Payer: Self-pay | Source: Ambulatory Visit | Admitting: Orthopedic Surgery

## 2011-11-26 ENCOUNTER — Inpatient Hospital Stay (HOSPITAL_COMMUNITY): Payer: Self-pay

## 2011-11-26 ENCOUNTER — Encounter (HOSPITAL_COMMUNITY): Payer: Self-pay | Admitting: Certified Registered"

## 2011-11-26 ENCOUNTER — Encounter (HOSPITAL_COMMUNITY)
Admission: AD | Disposition: A | Discharge status: Home / Self Care | Payer: Self-pay | Source: Ambulatory Visit | Attending: Orthopedic Surgery

## 2011-11-26 ENCOUNTER — Inpatient Hospital Stay (HOSPITAL_COMMUNITY): Payer: MEDICAID | Admitting: Certified Registered"

## 2011-11-26 HISTORY — PX: IRRIGATION AND DEBRIDEMENT KNEE: SHX5185

## 2011-11-26 LAB — C-REACTIVE PROTEIN: CRP: 6.8 mg/dL — ABNORMAL HIGH (ref <0.60)

## 2011-11-26 LAB — GRAM STAIN

## 2011-11-26 SURGERY — IRRIGATION AND DEBRIDEMENT KNEE
Anesthesia: General | Site: Knee | Laterality: Right | Wound class: Dirty or Infected

## 2011-11-26 MED ORDER — LACTATED RINGERS IV SOLN
INTRAVENOUS | Status: DC | PRN
  Administered 2011-11-26: 14:00:00 via INTRAVENOUS

## 2011-11-26 MED ORDER — PROPOFOL 10 MG/ML IV BOLUS
INTRAVENOUS | Status: DC | PRN
  Administered 2011-11-26: 200 mg via INTRAVENOUS

## 2011-11-26 MED ORDER — HYDROMORPHONE HCL PF 1 MG/ML IJ SOLN: 0.2500 mg | INTRAMUSCULAR | Status: DC | PRN

## 2011-11-26 MED ORDER — PROMETHAZINE HCL 25 MG/ML IJ SOLN
25.0000 mg | INTRAMUSCULAR | Status: AC
  Administered 2011-11-26: 25 mg via INTRAVENOUS

## 2011-11-26 MED ORDER — ALPRAZOLAM 0.5 MG PO TABS: 1.0000 mg | ORAL_TABLET | Freq: Three times a day (TID) | ORAL | Status: DC | PRN

## 2011-11-26 MED ORDER — ALPRAZOLAM 0.5 MG PO TABS
1.0000 mg | ORAL_TABLET | Freq: Three times a day (TID) | ORAL | Status: DC | PRN
  Administered 2011-11-27 – 2011-12-01 (×10): 1 mg via ORAL
  Filled 2011-11-26 (×4): qty 2
  Filled 2011-11-26: qty 1
  Filled 2011-11-26 (×5): qty 2
  Filled 2011-11-26: qty 1

## 2011-11-26 MED ORDER — SUFENTANIL CITRATE 50 MCG/ML IV SOLN
INTRAVENOUS | Status: DC | PRN
  Administered 2011-11-26: 20 ug via INTRAVENOUS

## 2011-11-26 MED ORDER — DROPERIDOL 2.5 MG/ML IJ SOLN: 0.6250 mg | INTRAMUSCULAR | Status: DC | PRN

## 2011-11-26 MED ORDER — SODIUM CHLORIDE 0.9 % IR SOLN
Status: DC | PRN
  Administered 2011-11-26: 6000 mL

## 2011-11-26 SURGICAL SUPPLY — 41 items
BANDAGE ELASTIC 6 VELCRO ST LF (GAUZE/BANDAGES/DRESSINGS) ×2 IMPLANT
CLOTH BEACON ORANGE TIMEOUT ST (SAFETY) ×2 IMPLANT
CONT SPEC 4OZ CLIKSEAL STRL BL (MISCELLANEOUS) ×4 IMPLANT
COVER SURGICAL LIGHT HANDLE (MISCELLANEOUS) ×2 IMPLANT
CUFF TOURNIQUET SINGLE 34IN LL (TOURNIQUET CUFF) IMPLANT
DRAPE U-SHAPE 47X51 STRL (DRAPES) ×2 IMPLANT
DRSG ADAPTIC 3X8 NADH LF (GAUZE/BANDAGES/DRESSINGS) ×2 IMPLANT
DRSG PAD ABDOMINAL 8X10 ST (GAUZE/BANDAGES/DRESSINGS) ×2 IMPLANT
DURAPREP 26ML APPLICATOR (WOUND CARE) ×4 IMPLANT
ELECT REM PT RETURN 9FT ADLT (ELECTROSURGICAL) ×2
ELECTRODE REM PT RTRN 9FT ADLT (ELECTROSURGICAL) ×1 IMPLANT
GLOVE BIO SURGEON STRL SZ7 (GLOVE) ×2 IMPLANT
GLOVE BIO SURGEON STRL SZ7.5 (GLOVE) ×2 IMPLANT
GLOVE BIOGEL PI IND STRL 8 (GLOVE) ×1 IMPLANT
GLOVE BIOGEL PI INDICATOR 8 (GLOVE) ×1
GOWN PREVENTION PLUS XLARGE (GOWN DISPOSABLE) ×4 IMPLANT
GOWN STRL NON-REIN LRG LVL3 (GOWN DISPOSABLE) ×4 IMPLANT
HANDPIECE INTERPULSE COAX TIP (DISPOSABLE) ×1
KIT BASIN OR (CUSTOM PROCEDURE TRAY) ×2 IMPLANT
KIT ROOM TURNOVER OR (KITS) ×2 IMPLANT
MANIFOLD NEPTUNE II (INSTRUMENTS) ×2 IMPLANT
NS IRRIG 1000ML POUR BTL (IV SOLUTION) ×2 IMPLANT
PACK ARTHROSCOPY DSU (CUSTOM PROCEDURE TRAY) ×2 IMPLANT
PAD ARMBOARD 7.5X6 YLW CONV (MISCELLANEOUS) ×4 IMPLANT
PAD CAST 4YDX4 CTTN HI CHSV (CAST SUPPLIES) ×1 IMPLANT
PADDING CAST COTTON 4X4 STRL (CAST SUPPLIES) ×1
PADDING CAST COTTON 6X4 STRL (CAST SUPPLIES) ×2 IMPLANT
SET HNDPC FAN SPRY TIP SCT (DISPOSABLE) ×1 IMPLANT
SPONGE GAUZE 4X4 12PLY (GAUZE/BANDAGES/DRESSINGS) ×2 IMPLANT
STAPLER VISISTAT 35W (STAPLE) ×2 IMPLANT
SUT VIC AB 0 CT1 27 (SUTURE) ×1
SUT VIC AB 0 CT1 27XBRD ANBCTR (SUTURE) ×1 IMPLANT
SUT VIC AB 1 CTX 36 (SUTURE) ×1
SUT VIC AB 1 CTX36XBRD ANBCTR (SUTURE) ×1 IMPLANT
SUT VIC AB 2-0 CT1 27 (SUTURE) ×1
SUT VIC AB 2-0 CT1 TAPERPNT 27 (SUTURE) ×1 IMPLANT
SYR 20CC LL (SYRINGE) ×4 IMPLANT
TOWEL OR 17X24 6PK STRL BLUE (TOWEL DISPOSABLE) ×2 IMPLANT
TOWEL OR 17X26 10 PK STRL BLUE (TOWEL DISPOSABLE) ×2 IMPLANT
TUBE ANAEROBIC SPECIMEN COL (MISCELLANEOUS) ×2 IMPLANT
WATER STERILE IRR 1000ML POUR (IV SOLUTION) ×6 IMPLANT

## 2011-11-26 NOTE — Transfer of Care (Signed)
Immediate Anesthesia Transfer of Care Note  Patient: Jason [name]  Procedure(s) Performed: Procedure(s) (LRB) with comments: IRRIGATION AND DEBRIDEMENT KNEE (Right)  Patient Location: PACU  Anesthesia Type: General  Level of Consciousness: sedated and patient cooperative  Airway & Oxygen Therapy: Patient Spontanous Breathing and Patient connected to nasal cannula oxygen  Post-op Assessment: Report given to PACU RN, Post -op Vital signs reviewed and stable and Patient moving all extremities X 4  Post vital signs: Reviewed and stable  Complications: No apparent anesthesia complications

## 2011-11-26 NOTE — Anesthesia Postprocedure Evaluation (Signed)
Anesthesia Post Note  Patient: Jason [name]  Procedure(s) Performed: Procedure(s) (LRB): IRRIGATION AND DEBRIDEMENT KNEE (Right)  Anesthesia type: general  Patient location: PACU  Post pain: Pain level controlled  Post assessment: Patient's Cardiovascular Status Stable  Last Vitals:  Filed Vitals:   11/26/11 1545  BP: 160/81  Pulse: 118  Temp: 37.8 C  Resp: 25    Post vital signs: Reviewed and stable  Level of consciousness: sedated  Complications: No apparent anesthesia complications

## 2011-11-26 NOTE — Anesthesia Procedure Notes (Signed)
Procedure Name: LMA Insertion Date/Time: 11/26/2011 2:44 PM Performed by: Patrick North Pre-anesthesia Checklist: Patient identified, Emergency Drugs available, Suction available and Patient being monitored Patient Re-evaluated:Patient Re-evaluated prior to inductionOxygen Delivery Method: Circle system utilized Preoxygenation: Pre-oxygenation with 100% oxygen Intubation Type: IV induction LMA: LMA with gastric port inserted LMA Size: 5.0 Number of attempts: 1 Placement Confirmation: positive ETCO2 and breath sounds checked- equal and bilateral Tube secured with: Tape Dental Injury: Teeth and Oropharynx as per pre-operative assessment

## 2011-11-26 NOTE — Progress Notes (Signed)
PATIENT ID: EILERT OCHELTREE  MRN: [id number]  DOB/AGE:  06-11-1965 / 46 y.o.         PROGRESS NOTE Subjective: Patient is alert, oriented, no Nausea, no Vomiting, yes passing gas, no Bowel Movement. NPO today. Denies SOB, Chest or Calf Pain. Using Incentive Spirometer, PAS in place. Patient reports pain as moderate  .    Objective: Vital signs in last 24 hours: Filed Vitals:   11/25/11 1157 11/25/11 1454 11/25/11 2200 11/26/11 0350  BP: 135/87 134/72 141/84 132/76  Pulse: 107 106 104 121  Temp: 98.5 F (36.9 C) 98.9 F (37.2 C) 98 F (36.7 C) 100.6 F (38.1 C)  Resp: 20 20 18 18   Height:      Weight:      SpO2: 98% 97% 97% 96%      Intake/Output from previous day: I/O last 3 completed shifts: In: 387 [P.O.:320; I.V.:67] Out: 1900 [Urine:1900]   Intake/Output this shift:     LABORATORY DATA:  Basename 11/25/11 1145  WBC 10.7*  HGB 14.4  HCT 42.1  PLT 258  NA 138  K 3.8  CL 103  CO2 22  BUN 14  CREATININE 0.92  GLUCOSE 112*  GLUCAP --  INR 1.05  CALCIUM 9.4    Examination: Neurologically intact ABD soft Neurovascular intact Sensation intact distally Intact pulses distally}  Assessment:       ADDITIONAL DIAGNOSIS:  none  Plan: Arthroscopic washout of knee today.  Continue IV vanc.  Awaiting culture results. DVT Prophylaxis:  SCDx72hrs DISCHARGE PLAN: Home DISCHARGE NEEDS: HHRN and IV Antibiotics     Paizleigh Wilds M. 11/26/2011, 8:01 AM

## 2011-11-26 NOTE — Op Note (Signed)
Pre-Op Dx: Pyarthrosis of right knee   Postop Dx: Same, with lateral meniscal tear posterior lateral corner   Procedure: Arthroscopic irrigation and debridement of pyarthrosis right knee, partial lateral meniscectomy arthroscopic.  Surgeon: Feliberto Gottron. Turner Daniels M.D.  Assist: Shirl Harris PA-C  Anes: General LMA  EBL: Minimal  Fluids: 800 cc   Indications: Status post arthroscopic rim chondromalacia and meniscectomy 5 weeks ago. Did well for one month. 7 or 8 days ago he fell onto the right knee with a popping and some bruising medially. A few days later he developed an effusion with increasing pain and fever so 102.6. He was seen in the office yesterday where we aspirated 20 cc of yellow pus. 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 He is taken for urgent irrigation and debridement of pyarthrosis right knee.. Risks and benefits of surgery have been discussed and questions answered.  Procedure: Patient identified by arm band and taken to the operating room at the day surgery Center. The appropriate anesthetic monitors were attached, and General LMA anesthesia was induced without difficulty. Lateral post was applied to the table and the lower extremity was prepped and draped in usual sterile fashion from the ankle to the midthigh. Time out procedure was performed. Using a 22 by one half needle a 20 cc syringe we then aspirated another 20 cc of apricot colored pus from the knee and sent this for Gram stain cell count culture and crystals. We began the operation by making standard inferior lateral and inferior medial peripatellar portals with a #11 blade allowing introduction of the arthroscope through the inferior lateral portal and the out flow to the inferior medial portal. Pump pressure was set at 100 mmHg and diagnostic arthroscopy  revealed moderately inflamed synovium the of the knee with no actual  loculations. Along the lateral gutter the synovium was somewhat cauliflower in appearance and this is debrider back to normal-appearing synovium. In the posterior lateral corner of the knee we found a small tear the lateral meniscus and debrided this back to a stable margin moving maybe 2 or 3% of the fibers. All compartments were thoroughly irrigated with normal saline solution for a total of 6 L. At the end of the irrigation all synovial fluid or joint fluid had a clear appearance. We then placed 2 large bore Hemovac drains in the knee using the scope portals and September suction with a Hemovac. A dressing of 4 x 4 dressing sponges, web roll and an Ace wrap was applied. The patient was awakened extubated and taken to the recovery without difficulty.    Signed: Nestor Lewandowsky, MD

## 2011-11-26 NOTE — H&P (View-Only) (Signed)
PATIENT ID: Jason [name]  MRN: [id number]  DOB/AGE:  11/29/1965 / 45 y.o.         PROGRESS NOTE Subjective: Patient is alert, oriented, no Nausea, no Vomiting, yes passing gas, no Bowel Movement. NPO today. Denies SOB, Chest or Calf Pain. Using Incentive Spirometer, PAS in place. Patient reports pain as moderate  .    Objective: Vital signs in last 24 hours: Filed Vitals:   11/25/11 1157 11/25/11 1454 11/25/11 2200 11/26/11 0350  BP: 135/87 134/72 141/84 132/76  Pulse: 107 106 104 121  Temp: 98.5 F (36.9 C) 98.9 F (37.2 C) 98 F (36.7 C) 100.6 F (38.1 C)  Resp: 20 20 18 18  Height:      Weight:      SpO2: 98% 97% 97% 96%      Intake/Output from previous day: I/O last 3 completed shifts: In: 387 [P.O.:320; I.V.:67] Out: 1900 [Urine:1900]   Intake/Output this shift:     LABORATORY DATA:  Basename 11/25/11 1145  WBC 10.7*  HGB 14.4  HCT 42.1  PLT 258  NA 138  K 3.8  CL 103  CO2 22  BUN 14  CREATININE 0.92  GLUCOSE 112*  GLUCAP --  INR 1.05  CALCIUM 9.4    Examination: Neurologically intact ABD soft Neurovascular intact Sensation intact distally Intact pulses distally}  Assessment:       ADDITIONAL DIAGNOSIS:  none  Plan: Arthroscopic washout of knee today.  Continue IV vanc.  Awaiting culture results. DVT Prophylaxis:  SCDx72hrs DISCHARGE PLAN: Home DISCHARGE NEEDS: HHRN and IV Antibiotics     Raena Pau M. 11/26/2011, 8:01 AM    

## 2011-11-26 NOTE — Interval H&P Note (Signed)
History and Physical Interval Note:  11/26/2011 2:00 PM  Jason [name]  has presented today for surgery, with the diagnosis of INFECTED RIGHT KNEE  The various methods of treatment have been discussed with the patient and family. After consideration of risks, benefits and other options for treatment, the patient has consented to  Procedure(s) (LRB) with comments: IRRIGATION AND DEBRIDEMENT KNEE (Right) as a surgical intervention .  The patient's history has been reviewed, patient examined, no change in status, stable for surgery.  I have reviewed the patient's chart and labs.  Questions were answered to the patient's satisfaction.     Nestor Lewandowsky

## 2011-11-26 NOTE — Anesthesia Preprocedure Evaluation (Addendum)
Anesthesia Evaluation  Patient identified by MRN, date of birth, ID band Patient awake    Reviewed: Allergy & Precautions, H&P , NPO status , Patient's Chart, lab work & pertinent test results  History of Anesthesia Complications (+) PONV and POST - OP SPINAL HEADACHE  Airway Mallampati: II TM Distance: >3 FB Neck ROM: Full    Dental  (+) Teeth Intact and Dental Advisory Given   Pulmonary neg pulmonary ROS,  breath sounds clear to auscultation  Pulmonary exam normal       Cardiovascular negative cardio ROS  Rhythm:Regular     Neuro/Psych Anxiety    GI/Hepatic negative GI ROS, Neg liver ROS,   Endo/Other  negative endocrine ROS  Renal/GU negative Renal ROS     Musculoskeletal   Abdominal   Peds  Hematology   Anesthesia Other Findings   Reproductive/Obstetrics                          Anesthesia Physical Anesthesia Plan  ASA: II  Anesthesia Plan: General   Post-op Pain Management:    Induction: Intravenous  Airway Management Planned: LMA  Additional Equipment:   Intra-op Plan:   Post-operative Plan: Extubation in OR  Informed Consent: I have reviewed the patients History and Physical, chart, labs and discussed the procedure including the risks, benefits and alternatives for the proposed anesthesia with the patient or authorized representative who has indicated his/her understanding and acceptance.   Dental advisory given  Plan Discussed with: CRNA, Anesthesiologist and Surgeon  Anesthesia Plan Comments:        Anesthesia Quick Evaluation

## 2011-11-26 NOTE — Progress Notes (Signed)
Utilization review completed. Shawnn Bouillon, RN, BSN. 

## 2011-11-27 ENCOUNTER — Encounter (HOSPITAL_COMMUNITY): Payer: Self-pay | Admitting: Orthopedic Surgery

## 2011-11-27 NOTE — Progress Notes (Addendum)
Patient ID: Jason [name], male   DOB: 17-Apr-1965, 46 y.o.   MRN: [id number] PATIENT ID: Jason [name]  MRN: [id number]  DOB/AGE:  [birth year] / 46 y.o.  1 Day Post-Op Procedure(s) (LRB): IRRIGATION AND DEBRIDEMENT KNEE (Right)    PROGRESS NOTE Subjective:   Patient is alert, oriented, no Nausea, no Vomiting, yes passing gas, no Bowel Movement. Taking PO well. Denies SOB, Chest or Calf Pain. Using Incentive Spirometer, PAS in place. Ambulate weight bearing as tolerated with knee immobilizer, Patient reports pain as 8 on 0-10 scale  The patient had sensation of a skipped beat and his heart last night, EKG set sinus tachycardia with no other changes. He's not had any cardiac issues in the past but does report the generally has a rapid heart rate. He denies any shortness of breath or chest pain.   Objective: Vital signs in last 24 hours: Temp:  [98.4 F (36.9 C)-101.8 F (38.8 C)] 100.1 F (37.8 C) (09/12 0614) Pulse Rate:  [111-123] 120  (09/12 0614) Resp:  [12-25] 18  (09/12 0614) BP: (120-160)/(58-93) 137/76 mmHg (09/12 0614) SpO2:  [94 %-98 %] 97 % (09/12 0614)    Intake/Output from previous day: I/O last 3 completed shifts: In: 950 [P.O.:250; I.V.:700] Out: 2150 [Urine:2150]   Intake/Output this shift:     LABORATORY DATA:  Basename 11/25/11 1145  WBC 10.7*  HGB 14.4  HCT 42.1  PLT 258  NA 138  K 3.8  CL 103  CO2 22  BUN 14  CREATININE 0.92  GLUCOSE 112*  GLUCAP --  INR 1.05  CALCIUM 9.4   Gram stain showed abundant white cells polys and monos with no organisms. All cultures remained negative at 48 hours from the office and 24 hour from surgery yesterday.   Examination: Neurologically intact ABD soft Neurovascular intact Sensation intact distally Intact pulses distally Dorsiflexion/Plantar flexion intact Incision: no drainage No cellulitis present Compartment soft} Drain tubes and right knee have separated plasma and has been scant drainage over the last  shift. 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 Sinus tachycardia  Plan:  Weight Bearing as Tolerated (WBAT), with knee immobilizer, if he has little drainage tomorrow we will remove the drains, we will then watch him for 24 hours if the effusion has not reaccumulated he should be able to be discharged home on IV antibiotics at that time.  DVT Prophylaxis:  SCDs  DISCHARGE PLAN: Home, the patient has no health insurance but he does have some family help. He will need at least 4 weeks of IV vancomycin and and oral antibiotics for another month after that.  DISCHARGE NEEDS: Walker and IV Antibiotics, vancomycin for at least 4 weeks by vein and then oral doxycycline.     Nestor Lewandowsky 11/27/2011, 8:47 AM

## 2011-11-27 NOTE — Progress Notes (Signed)
At 0300, patient complained of feeling as if his heart had skipped a few beats, and occasional jolts of nerve pain running down his left arm, which he claims he has had for a week prior to surgery.  Patient has had tachycardia throughout this hospital admission, and claims he has a slightly elevated heart rate normally.  Denies shortness of breath, BP is stable.   EKG performed showing sinus tachycardia. [name], PA aware.  No further orders at this time.  No further complaints from patient this shift.

## 2011-11-27 NOTE — Progress Notes (Signed)
ANTIBIOTIC CONSULT NOTE - FOLLOW UP  Pharmacy Consult for vancomycin Indication: suspected knee infection  Allergies  Allergen Reactions  . Ivp Dye (Iodinated Diagnostic Agents) Anaphylaxis  . Reglan (Metoclopramide) Anaphylaxis  . Codeine Hives  . Lidocaine Other (See Comments)    Pt not sure if this is an actual allergy - might have been a one time incident  . Penicillins Rash    Patient Measurements: Height: 5\' 10"  (177.8 cm) Weight: 215 lb (97.523 kg) IBW/kg (Calculated) : 73   Vital Signs: Temp: 99.2 F (37.3 C) (09/12 1533) Temp src: Oral (09/12 1533) BP: 140/73 mmHg (09/12 1407) Pulse Rate: 125  (09/12 1407) Intake/Output from previous day: 09/11 0701 - 09/12 0700 In: 950 [P.O.:250; I.V.:700] Out: 1450 [Urine:1450] Intake/Output from this shift:    Labs:  Basename 11/25/11 1145  WBC 10.7*  HGB 14.4  PLT 258  LABCREA --  CREATININE 0.92   Estimated Creatinine Clearance: 121.3 ml/min (by C-G formula based on Cr of 0.92).  Basename 11/27/11 1040  VANCOTROUGH 14.0  VANCOPEAK --  VANCORANDOM --  GENTTROUGH --  GENTPEAK --  GENTRANDOM --  TOBRATROUGH --  TOBRAPEAK --  TOBRARND --  AMIKACINPEAK --  AMIKACINTROU --  AMIKACIN --      Assessment: 46 yo male with R knee infection on vancomycin and trough today (14) is at goal.  9/11 knee fluid- ngtd  Goal of Therapy:  Vancomycin trough level 10-15 mcg/ml  Plan:  -No vancomycin changes -Noted for 4 weeks of IV therapy  Harland German, Pharm D 11/27/2011 3:57 PM

## 2011-11-28 ENCOUNTER — Inpatient Hospital Stay (HOSPITAL_COMMUNITY): Payer: MEDICAID

## 2011-11-28 MED ORDER — HYDROMORPHONE HCL 2 MG PO TABS
4.0000 mg | ORAL_TABLET | ORAL | Status: DC | PRN
  Administered 2011-11-28 – 2011-11-29 (×8): 4 mg via ORAL
  Administered 2011-11-30: 2 mg via ORAL
  Administered 2011-11-30 (×3): 4 mg via ORAL
  Administered 2011-11-30: 2 mg via ORAL
  Administered 2011-12-01 (×3): 4 mg via ORAL
  Filled 2011-11-28 (×2): qty 2
  Filled 2011-11-28: qty 1
  Filled 2011-11-28 (×5): qty 2
  Filled 2011-11-28: qty 1
  Filled 2011-11-28 (×7): qty 2

## 2011-11-28 MED ORDER — GADOBENATE DIMEGLUMINE 529 MG/ML IV SOLN
20.0000 mL | Freq: Once | INTRAVENOUS | Status: AC | PRN
  Administered 2011-11-28: 20 mL via INTRAVENOUS

## 2011-11-28 NOTE — Progress Notes (Signed)
PATIENT ID: ABEM SHADDIX  MRN: [id number]  DOB/AGE:  January 27, 1966 / 46 y.o.  2 Days Post-Op Procedure(s) (LRB): IRRIGATION AND DEBRIDEMENT KNEE (Right)    PROGRESS NOTE Subjective: Patient is alert, oriented, no Nausea, no Vomiting, yes passing gas, no Bowel Movement. Taking PO well. Denies SOB, Chest or Calf Pain. Using Incentive Spirometer, PAS in place. Patient reports pain as moderate  .    Objective: Vital signs in last 24 hours: Filed Vitals:   11/27/11 1533 11/27/11 2257 11/28/11 0213 11/28/11 0610  BP:  148/86  134/80  Pulse:  121  126  Temp: 99.2 F (37.3 C) 100.8 F (38.2 C) 100.7 F (38.2 C) 99.5 F (37.5 C)  TempSrc: Oral  Oral   Resp:  18  18  Height:      Weight:      SpO2:  92%  90%      Intake/Output from previous day: I/O last 3 completed shifts: In: -  Out: 1505 [Urine:1475; Drains:30]   Intake/Output this shift: Total I/O In: 240 [P.O.:240] Out: -    LABORATORY DATA:  Basename 11/25/11 1145  WBC 10.7*  HGB 14.4  HCT 42.1  PLT 258  NA 138  K 3.8  CL 103  CO2 22  BUN 14  CREATININE 0.92  GLUCOSE 112*  GLUCAP --  INR 1.05  CALCIUM 9.4    Examination: Neurologically intact ABD soft Neurovascular intact Sensation intact distally Intact pulses distally Dorsiflexion/Plantar flexion intact Incision: dressing C/D/I}  Drains pulled - minimal output Cultures show no growth  Assessment:   2 Days Post-Op Procedure(s) (LRB): IRRIGATION AND DEBRIDEMENT KNEE (Right) ADDITIONAL DIAGNOSIS:  none  Plan: DVT Prophylaxis:  SCDx72hrs, ASA 325 mg BID x 2 weeks DISCHARGE PLAN: Home Saturday DISCHARGE NEEDS: HHRN and IV Antibiotics     Boyd Litaker M. 11/28/2011, 10:39 AM

## 2011-11-28 NOTE — Clinical Social Work Psychosocial (Signed)
     Clinical Social Work Department BRIEF PSYCHOSOCIAL ASSESSMENT 11/28/2011  Patient:  Jason [name], Jason [name]     Account Number:  [id number]     Admit date:  11/25/2011  Clinical Social Worker:  Burnard Hawthorne  Date/Time:  11/28/2011 10:00 AM  Referred by:  Physician  Date Referred:  11/27/2011 Referred for  Psychosocial assessment   Other Referral:   Interview type:  Patient Other interview type:    PSYCHOSOCIAL DATA Living Status:  ALONE Admitted from facility:   Level of care:   Primary support name:   Primary support relationship to patient:   Degree of support available:    CURRENT CONCERNS Current Concerns  Other - See comment   Other Concerns:   Needs 4 weeks of IV antibiotics; no money- no insurance    SOCIAL WORK ASSESSMENT / PLAN Met with pateint today to discuss needs. Received call from Jason [name], [place] for Dr. Turner [name]. Their office had received a call from "a family member" expressing concern of patient going home with a PICC line stating that they were afraid he would use PICC for illegal IV usage.  Thus- MD was wanted to determine if short term SNF woudl be feasible for patient. Met with patient to discuss-- he adamantly refuses SNF stating that he is the caregiver for his son and his "ex" will use my abscence as an excuse to take custody of my son.  He relates that he will return home and he states that he and his girlfriend will be able to manage his IV antibiotics with help of HH.  CSW spoke with patient regarding use of any drugs at home-- patient stated adamantly that he does not drink or do drugs and that he is a "parent to his son." There is no indication per medical record of any drug use. Patient was not told of family member's accusations but based on above information-- CSW questions if the drug report has anything to do with custody of the patient's son. Patient remains adamant that he does not do drugs.  Discussed with [name]. Will proceed with dc  plan for home with [facility]- for IV antibiotics. Patient does not have any insurance- CSW will notify financial counselor of need to talk to patient.   Assessment/plan status:  No Further Intervention Required Other assessment/ plan:   Information/referral to community resources:   Refered patient to financial counselor-  Jason [name]    PATIENTS/FAMILYS RESPONSE TO PLAN OF CARE: Patient is alert, oriented and very pleasant. States that he need to return home rather than seek SNF placement. He feels that his girfriend- whom he lives with- will be able to assist with this IV meds.  Patient denied any further needs. CSW signing off.

## 2011-11-29 DIAGNOSIS — M1990 Unspecified osteoarthritis, unspecified site: Secondary | ICD-10-CM

## 2011-11-29 DIAGNOSIS — M009 Pyogenic arthritis, unspecified: Secondary | ICD-10-CM

## 2011-11-29 DIAGNOSIS — L0292 Furuncle, unspecified: Secondary | ICD-10-CM | POA: Diagnosis not present

## 2011-11-29 HISTORY — DX: Unspecified osteoarthritis, unspecified site: M19.90

## 2011-11-29 HISTORY — DX: Pyogenic arthritis, unspecified: M00.9

## 2011-11-29 LAB — C-REACTIVE PROTEIN: CRP: 19.5 mg/dL — ABNORMAL HIGH (ref <0.60)

## 2011-11-29 LAB — SEDIMENTATION RATE: Sed Rate: 36 mm/hr — ABNORMAL HIGH (ref 0–16)

## 2011-11-29 MED ORDER — CIPROFLOXACIN HCL 750 MG PO TABS
750.0000 mg | ORAL_TABLET | Freq: Two times a day (BID) | ORAL | Status: DC
  Administered 2011-11-29 – 2011-12-01 (×5): 750 mg via ORAL
  Filled 2011-11-29 (×7): qty 1

## 2011-11-29 NOTE — Progress Notes (Signed)
Pt. complains of "tightness" in his right leg. States the ACE bandage around his knee "feels tighter today".  Denies any numbness, but does feel slight "tingling" in his right toes. Able to move both feet and all toes upon request with full sensation bilaterally. Notified provider on call and placed order for dopplers of the RLE to r/o DVT. Will continue to monitor.   Pershing Proud, RN 4:50 PM

## 2011-11-29 NOTE — Progress Notes (Signed)
PATIENT ID: Jason [name]  MRN: [id number]  DOB/AGE:  April 28, 1965 / 46 y.o.  3 Days Post-Op Procedure(s) (LRB): IRRIGATION AND DEBRIDEMENT KNEE (Right)    PROGRESS NOTE Subjective: Patient is alert, oriented, no Nausea, no Vomiting, yes passing gas. Taking PO well. Denies SOB, Chest or Calf Pain. Using Incentive Spirometer, PAS in place. Patient reports pain as moderate  .    Objective: Vital signs in last 24 hours: Filed Vitals:   11/28/11 0610 11/28/11 1409 11/28/11 2112 11/29/11 0558  BP: 134/80 149/82 137/92 111/65  Pulse: 126 116 113 116  Temp: 99.5 F (37.5 C) 99.1 F (37.3 C) 100.5 F (38.1 C) 100.8 F (38.2 C)  TempSrc:      Resp: 18 18 20 20   Height:      Weight:      SpO2: 90% 99% 97% 94%      Intake/Output from previous day: I/O last 3 completed shifts: In: 240 [P.O.:240] Out: 1450 [Urine:1450]   Intake/Output this shift:     LABORATORY DATA: No results found for this basename: WBC:2,HGB:2,HCT:2,PLT:2,NA:2,K:2,CL:2,CO2:2,BUN:2,CREATININE:2,GLUCOSE:2,GLUCAP:3,PT:2,INR:2,CALCIUM,2 in the last 72 hours  Examination: Neurologically intact ABD soft Neurovascular intact Sensation intact distally Intact pulses distally Dorsiflexion/Plantar flexion intact Incision: dressing C/D/I}  Assessment:   3 Days Post-Op Procedure(s) (LRB): IRRIGATION AND DEBRIDEMENT KNEE (Right) ADDITIONAL DIAGNOSIS:  none  Plan: WBAT. Dr. Orvan Falconer with I.D. Has been consulted and will evaluate patient today.  He has 3 sores that were documented by nursing yesterday.  Chest x-ray and urine culture were negative.  MRI of right knee showed synovitis, no evidence of osteomyelitis.    DVT Prophylaxis:  SCDx72hrs, ASA 325 mg BID x 2 weeks DISCHARGE PLAN: Home DISCHARGE NEEDS: HHRN and IV Antibiotics     Jason [name] M. 11/29/2011, 8:10 AM

## 2011-11-29 NOTE — Consult Note (Signed)
[facility]  Recent outpatient the doxycycline therapy        Day 5 vancomycin       Reason for Consult: Septic arthritis, new skin lesions and persistent fever    Referring Physician: Dr. Gean Birchwood  Principal Problem:  *Septic arthritis of knee, right Active Problems:  Boils  DJD (degenerative joint disease)      . vancomycin  1,000 mg Intravenous Q8H  . zolpidem  10 mg Oral QHS    Recommendations: 1. Continue vancomycin 2. Start oral ciprofloxacin   Assessment: Staph infection is certainly the most likely culprit given his history and new skin lesions. However it is unclear why he would not respond to doxycycline, arthroscopic washout and vancomycin. Therefore, I will broaden his antibiotic coverage for gram-negative rods. I will follow with you this weekend.    HPI: Jason [name] is a 46 y.o. male with degenerative joint disease who has had multiple orthopedic procedures. He has a history of having had staph infections following shoulder surgery and right knee surgeries. He underwent arthroscopy on August 9. He did well for the first 2 weeks and then began to note some swelling and pain. He recalls taking some old the doxycycline leftover from a prescription that he had last year. He believes he took it for about 10 days but did not notice any improvement. He had fluid aspirated the joint on September 10. Gram stain and culture was negative and he was admitted on September 11 for arthroscopic washout of. Pus was noted in the joint at the time of surgery. Operative Gram stain and cultures were negative. He has had persistent fevers to 101 despite vancomycin therapy. He states that his feeling miserable and continues to be bothered by sweats. He is also developed 3 new skin lesions, one on his right upper arm, posterior neck and left buttock. He states they began as small pimples and an enlarged. He was able to express some pus from the one on his neck.  He has not had any diarrhea.  Review of Systems: Pertinent items are noted in HPI.  Past Medical History  Diagnosis Date  . Complication of anesthesia   . PONV (postoperative nausea and vomiting)   . Bronchitis   . Neuromuscular disorder     carpal tunnel bilateral, ulner nerve surgery  . Anxiety   . Spinal headache     "long time ago"    History  Substance Use Topics  . Smoking status: Never Smoker   . Smokeless tobacco: Never Used  . Alcohol Use: No    History reviewed. No pertinent family history. Allergies  Allergen Reactions  . Ivp Dye (Iodinated Diagnostic Agents) Anaphylaxis  . Reglan (Metoclopramide) Anaphylaxis  . Codeine Hives  . Lidocaine Other (See Comments)    Pt not sure if this is an actual allergy - might have been a one time incident  . Penicillins Rash    OBJECTIVE: Blood pressure 111/65, pulse 116, temperature 100.8 F (38.2 C), temperature source Oral, resp. rate 20, height 5\' 10"  (1.778 m), weight 97.523 kg (215 lb), SpO2 94.00%. General:  he appears uncomfortable and he is sweating profusely. Eyes: He is crusting at the corner. No conjunctival injection or other lesions Skin:  He does have 3 bandaged lesions. Each has some superficial central ulceration surrounded by erythema that matches the shape of the bandage. There is some yellow-brown drainage on the bandages. Lungs:  clear Cor:  regular S1  and S2 no murmurs Abdomen:  soft and nontender Right knee incisions look good  Microbiology: Recent Results (from the past 240 hour(s))  SURGICAL PCR SCREEN     Status: Normal   Collection Time   11/25/11 10:06 PM      Component Value Range Status Comment   MRSA, PCR NEGATIVE  NEGATIVE Final    Staphylococcus aureus NEGATIVE  NEGATIVE Final   BODY FLUID CULTURE     Status: Normal (Preliminary result)   Collection Time   11/26/11  2:56 PM      Component Value Range Status Comment   Specimen Description FLUID SYNOVIAL KNEE RIGHT   Final    Special  Requests PATIENT ON FOLLOWING VANCOMYCIN   Final    Gram Stain     Final    Value: ABUNDANT WBC PRESENT,BOTH PMN AND MONONUCLEAR     NO ORGANISMS SEEN     Performed at [facility]   Culture NO GROWTH 1 DAY   Final    Report Status PENDING   Incomplete   ANAEROBIC CULTURE     Status: Normal (Preliminary result)   Collection Time   11/26/11  2:56 PM      Component Value Range Status Comment   Specimen Description FLUID SYNOVIAL KNEE RIGHT   Final    Special Requests PATIENT ON FOLLOWING VANCOMYCIN   Final    Gram Stain     Final    Value: ABUNDANT WBC PRESENT,BOTH PMN AND MONONUCLEAR     NO ORGANISMS SEEN     Performed at [facility]   Culture     Final    Value: NO ANAEROBES ISOLATED; CULTURE IN PROGRESS FOR 5 DAYS   Report Status PENDING   Incomplete   GRAM STAIN     Status: Normal   Collection Time   11/26/11  2:56 PM      Component Value Range Status Comment   Specimen Description FLUID SYNOVIAL KNEE RIGHT   Final    Special Requests PATIENT ON FOLLOWING VANCOMYCIN   Final    Gram Stain     Final    Value: ABUNDANT WBC PRESENT,BOTH PMN AND MONONUCLEAR     NO ORGANISMS SEEN   Report Status 11/26/2011 FINAL   Final   WOUND CULTURE     Status: Normal (Preliminary result)   Collection Time   11/28/11  7:10 PM      Component Value Range Status Comment   Specimen Description WOUND AXILLA RIGHT   Final    Special Requests Normal   Final    Gram Stain PENDING   Incomplete    Culture NO GROWTH   Final    Report Status PENDING   Incomplete     Cliffton Asters, MD [facility] [phone] pager   [phone] cell 11/29/2011, 10:53 AM

## 2011-11-30 LAB — BODY FLUID CULTURE: Culture: NO GROWTH

## 2011-11-30 LAB — URINE CULTURE: Special Requests: NORMAL

## 2011-11-30 NOTE — Progress Notes (Signed)
11/30/2011 1833 Pt is being followed by [facility] for IV abx. Will need HH orders, IV med dosing and # doses at time of d/c. Tisha Cline RN CCM  Case Mgmt phone [phone] 

## 2011-11-30 NOTE — Progress Notes (Signed)
Pt. states he "doesn't feel normal" after receiving his scheduled dose of vancomycin. MD aware per progress note. Advised pt. MD will assess vanc. treatment tomorrow and reevaluate current antibiotic regimen. Will continue to monitor.

## 2011-11-30 NOTE — Progress Notes (Signed)
ANTIBIOTIC CONSULT NOTE - FOLLOW UP  Pharmacy Consult for vancomycin Indication: suspected knee infection  Allergies  Allergen Reactions  . Ivp Dye (Iodinated Diagnostic Agents) Anaphylaxis  . Reglan (Metoclopramide) Anaphylaxis  . Codeine Hives  . Lidocaine Other (See Comments)    Pt not sure if this is an actual allergy - might have been a one time incident  . Penicillins Rash    Patient Measurements: Height: 5\' 10"  (177.8 cm) Weight: 215 lb (97.523 kg) IBW/kg (Calculated) : 73   Vital Signs: Temp: 98.8 F (37.1 C) (09/15 0616) Temp src: Oral (09/15 0616) BP: 142/60 mmHg (09/15 0616) Pulse Rate: 98  (09/15 0616) Intake/Output from previous day: 09/14 0701 - 09/15 0700 In: 720 [P.O.:720] Out: 2250 [Urine:2250]  Labs: Estimated Creatinine Clearance: 121.3 ml/min (by C-G formula based on Cr of 0.92).  Assessment: 46 yo male with R knee infection on vancomycin. Trough was 14 on 9/12 which is in goal range. ID is following and added ciprofloxacin yesterday for broader coverage due to patient developing new skin lesions.  9/11 knee fluid- ngtd  Goal of Therapy:  Vancomycin trough level 10-15 mcg/ml  Plan:  -Continue vancomycin 1000mg  IV Q8h -Noted for 4 weeks of IV therapy -Will order BMET for tomorrow morning to ensure there has been no drastic change in SCr -May need to draw another trough if suspect accumulation after BMET  Soliyana Mcchristian D. Deovion Batrez, PharmD Clinical Pharmacist Pager: [phone] Phone: [phone] 11/30/2011 1:26 PM

## 2011-11-30 NOTE — Progress Notes (Signed)
PATIENT ID: DE JAWORSKI  MRN: [id number]  DOB/AGE:  1965-05-17 / 46 y.o.  4 Days Post-Op Procedure(s) (LRB): IRRIGATION AND DEBRIDEMENT KNEE (Right)    PROGRESS NOTE Subjective: Patient is alert, oriented, no Nausea, no Vomiting, yes passing gas. Taking PO well. Denies SOB, Chest or Calf Pain. Complains of calf tightness, but reports that it has improved since yesterday. Using [employer]. Patient reports pain as moderate  .    Objective: Vital signs in last 24 hours: Filed Vitals:   11/29/11 0558 11/29/11 1540 11/29/11 2218 11/30/11 0616  BP: 111/65 140/78 138/70 142/60  Pulse: 116 114 100 98  Temp: 100.8 F (38.2 C) 99 F (37.2 C) 99 F (37.2 C) 98.8 F (37.1 C)  TempSrc:   Oral Oral  Resp: 20 18 18 16   Height:      Weight:      SpO2: 94% 95% 95% 97%      Intake/Output from previous day: I/O last 3 completed shifts: In: 720 [P.O.:720] Out: 2850 [Urine:2850]   Intake/Output this shift:     LABORATORY DATA: No results found for this basename: WBC:2,HGB:2,HCT:2,PLT:2,NA:2,K:2,CL:2,CO2:2,BUN:2,CREATININE:2,GLUCOSE:2,GLUCAP:3,PT:2,INR:2,CALCIUM,2 in the last 72 hours  Examination: Neurologically intact ABD soft Neurovascular intact Sensation intact distally Intact pulses distally Dorsiflexion/Plantar flexion intact Incision: dressing C/D/I} - surgical portals benign, trace effusion, dressing  Changed.  Assessment:   4 Days Post-Op Procedure(s) (LRB): IRRIGATION AND DEBRIDEMENT KNEE (Right) ADDITIONAL DIAGNOSIS:  none  Plan: DVT Prophylaxis:  SCDx72hrs, ASA 325 mg BID x 2 weeks Dr. Orvan Falconer with I.D. Was consulted yesterday to manage antibiotics. DISCHARGE PLAN: Home when stable, possibly Monday. DISCHARGE NEEDS: HHRN and IV Antibiotics     Aizlyn Schifano M. 11/30/2011, 10:16 AM

## 2011-11-30 NOTE — Progress Notes (Signed)
Patient ID: Jason [name], male   DOB: August 26, 1965, 46 y.o.   MRN: [id number]    [facility]    Date of Admission:  11/25/2011           Day 6 vancomycin        Day 2 ciprofloxacin Principal Problem:  *Septic arthritis of knee, right Active Problems:  Boils  DJD (degenerative joint disease)      . ciprofloxacin  750 mg Oral BID  . vancomycin  1,000 mg Intravenous Q8H  . zolpidem  10 mg Oral QHS    Subjective: He states that he's feeling 10 times better than yesterday. He states he is having decreased drainage from his 3 skin lesions and does not feel as exhausted and worn out. He tells me though that he does note some flushing right after he receives his vancomycin. He states that he is very worried about going home with a PICC line and IV antibiotics. He states that his done that many times in the past and has had problems with infections related to the PICC line. He is also worried about the cost of home health since he does not have any insurance. He is requesting to go home on oral antibiotics alone.  Objective: Temp:  [98.8 F (37.1 C)-99 F (37.2 C)] 98.8 F (37.1 C) (09/15 0616) Pulse Rate:  [98-100] 98  (09/15 0616) Resp:  [16-18] 16  (09/15 0616) BP: (138-142)/(60-70) 142/60 mmHg (09/15 0616) SpO2:  [95 %-97 %] 97 % (09/15 0616)  General: He looks much better than yesterday. He is alert and comfortable. He continues to have some puffiness around his right eye but this looks better.  Skin: The skin lesions on his right upper arm, posterior neck and left buttock are improving. Lungs: Clear Cor: Regular S1 and S2 no murmurs He has less pain with movement of his knee  Lab Results Lab Results  Component Value Date   WBC 10.7* 11/25/2011   HGB 14.4 11/25/2011   HCT 42.1 11/25/2011   MCV 89.0 11/25/2011   PLT 258 11/25/2011    Lab Results  Component Value Date   CREATININE 0.92 11/25/2011   BUN 14 11/25/2011   NA 138 11/25/2011   K 3.8 11/25/2011    CL 103 11/25/2011   CO2 22 11/25/2011    No results found for this basename: ALT, AST, GGT, ALKPHOS, BILITOT      Microbiology: Recent Results (from the past 240 hour(s))  SURGICAL PCR SCREEN     Status: Normal   Collection Time   11/25/11 10:06 PM      Component Value Range Status Comment   MRSA, PCR NEGATIVE  NEGATIVE Final    Staphylococcus aureus NEGATIVE  NEGATIVE Final   BODY FLUID CULTURE     Status: Normal   Collection Time   11/26/11  2:56 PM      Component Value Range Status Comment   Specimen Description FLUID SYNOVIAL KNEE RIGHT   Final    Special Requests PATIENT ON FOLLOWING VANCOMYCIN   Final    Gram Stain     Final    Value: ABUNDANT WBC PRESENT,BOTH PMN AND MONONUCLEAR     NO ORGANISMS SEEN     Performed at [facility]   Culture NO GROWTH 3 DAYS   Final    Report Status 11/30/2011 FINAL   Final   ANAEROBIC CULTURE     Status: Normal (Preliminary result)   Collection Time  11/26/11  2:56 PM      Component Value Range Status Comment   Specimen Description FLUID SYNOVIAL KNEE RIGHT   Final    Special Requests PATIENT ON FOLLOWING VANCOMYCIN   Final    Gram Stain     Final    Value: ABUNDANT WBC PRESENT,BOTH PMN AND MONONUCLEAR     NO ORGANISMS SEEN     Performed at [facility]   Culture     Final    Value: NO ANAEROBES ISOLATED; CULTURE IN PROGRESS FOR 5 DAYS   Report Status PENDING   Incomplete   GRAM STAIN     Status: Normal   Collection Time   11/26/11  2:56 PM      Component Value Range Status Comment   Specimen Description FLUID SYNOVIAL KNEE RIGHT   Final    Special Requests PATIENT ON FOLLOWING VANCOMYCIN   Final    Gram Stain     Final    Value: ABUNDANT WBC PRESENT,BOTH PMN AND MONONUCLEAR     NO ORGANISMS SEEN   Report Status 11/26/2011 FINAL   Final   WOUND CULTURE     Status: Normal (Preliminary result)   Collection Time   11/28/11  7:10 PM      Component Value Range Status Comment   Specimen Description WOUND AXILLA RIGHT    Final    Special Requests Normal   Final    Gram Stain     Final    Value: NO WBC SEEN     RARE SQUAMOUS EPITHELIAL CELLS PRESENT     NO ORGANISMS SEEN   Culture NO GROWTH 1 DAY   Final    Report Status PENDING   Incomplete   URINE CULTURE     Status: Normal   Collection Time   11/28/11  8:50 PM      Component Value Range Status Comment   Specimen Description URINE, CLEAN CATCH   Final    Special Requests vancomycin Normal   Final    Culture  Setup Time 11/28/2011 21:41   Final    Colony Count NO GROWTH   Final    Culture NO GROWTH   Final    Report Status 11/30/2011 FINAL   Final     Studies/Results: Dg Chest Port 1 View  11/28/2011  *RADIOLOGY REPORT*  Clinical Data: Fever.  Cough.  PORTABLE CHEST - 1 VIEW 11/28/2011 2000 hours:  Comparison: Two-view chest x-ray 11/26/2011.  Findings: Cardiomediastinal silhouette unremarkable.  Lungs clear. Right arm PICC tip has flipped upward, now overlying the junction of the right and left innominate veins.  IMPRESSION:  1.  No acute cardiopulmonary disease. 2.  The right arm PICC tip has flipped upward and is now overlying the expected location of the junction of the innominate veins. This is still a central location.   Original Report Authenticated By: Arnell Sieving, M.D.     Assessment: He looks much better today and has defervesced after addition of ciprofloxacin to his regimen. All cultures remain negative but he had been on doxycycline prior to surgery. He may be a candidate for early conversion to a fully oral antibiotic regimen if he continues to improve rapidly. We should consider changing the IV vancomycin to high-dose oral trimethoprim-sulfamethoxazole for some MRSA coverage and continue oral ciprofloxacin.  Plan: 1. Continue vancomycin and ciprofloxacin for now 2. Dr. Ninetta Lights will followup tomorrow  Cliffton Asters, MD [facility] [phone] pager  [phone] cell 11/30/2011,  4:39 PM

## 2011-12-01 DIAGNOSIS — Z9889 Other specified postprocedural states: Secondary | ICD-10-CM

## 2011-12-01 LAB — ANAEROBIC CULTURE

## 2011-12-01 LAB — WOUND CULTURE
Culture: NO GROWTH
Gram Stain: NONE SEEN
Special Requests: NORMAL

## 2011-12-01 MED ORDER — HYDROCODONE-ACETAMINOPHEN 5-325 MG PO TABS: 1.0000 | ORAL_TABLET | ORAL | Status: DC | PRN

## 2011-12-01 MED ORDER — SULFAMETHOXAZOLE-TRIMETHOPRIM 400-80 MG PO TABS: 1.0000 | ORAL_TABLET | Freq: Two times a day (BID) | ORAL | Status: DC

## 2011-12-01 MED ORDER — CIPROFLOXACIN HCL 750 MG PO TABS: 750.0000 mg | ORAL_TABLET | Freq: Two times a day (BID) | ORAL | Status: DC

## 2011-12-01 NOTE — Progress Notes (Signed)
PATIENT ID: COLIN ELLERS  MRN: [id number]  DOB/AGE:  04/04/1965 / 46 y.o.  5 Days Post-Op Procedure(s) (LRB): IRRIGATION AND DEBRIDEMENT KNEE (Right)    PROGRESS NOTE Subjective: Patient is alert, oriented, no Nausea, no Vomiting, yes passing gas, Taking PO well.  Reports that he feels much better today. Denies SOB, Chest or Calf Pain. Using Incentive Spirometer, PAS in place. Patient reports pain as moderate  .    Objective: Vital signs in last 24 hours: Filed Vitals:   11/30/11 0616 11/30/11 1400 11/30/11 2051 12/01/11 0617  BP: 142/60 135/88 130/85 123/71  Pulse: 98 85 118 92  Temp: 98.8 F (37.1 C) 98.8 F (37.1 C) 100.9 F (38.3 C) 98.7 F (37.1 C)  TempSrc: Oral Oral Oral Oral  Resp: 16 18 18 16   Height:      Weight:      SpO2: 97% 96% 95% 95%      Intake/Output from previous day: I/O last 3 completed shifts: In: 1280 [P.O.:720; I.V.:160; IV Piggyback:400] Out: 2625 [Urine:2625]   Intake/Output this shift:     LABORATORY DATA: No results found for this basename: WBC:2,HGB:2,HCT:2,PLT:2,NA:2,K:2,CL:2,CO2:2,BUN:2,CREATININE:2,GLUCOSE:2,GLUCAP:3,PT:2,INR:2,CALCIUM,2 in the last 72 hours  Examination: Neurologically intact ABD soft Neurovascular intact Sensation intact distally Intact pulses distally Dorsiflexion/Plantar flexion intact Incision: dressing C/D/I}  Assessment:   5 Days Post-Op Procedure(s) (LRB): IRRIGATION AND DEBRIDEMENT KNEE (Right) ADDITIONAL DIAGNOSIS:  none  Plan: DVT Prophylaxis:  SCDx72hrs, ASA 325 mg BID x 2 weeks Continue IV antibiotics per ID.  D/C home when stable per ID. DISCHARGE PLAN: Home DISCHARGE NEEDS: HHRN and IV Antibiotics     Lessie Manigo M. 12/01/2011, 7:36 AM

## 2011-12-01 NOTE — Progress Notes (Signed)
Per MD order, PICC line removed. Cath intact at 44cm. Vaseline pressure gauze to site, pressure held x 5min. No bleeding to site. Pt instructed to keep dressing CDI x 24 hours. Avoid heavy lifting, pushing or pulling x 24 hours,  If bleeding occurs hold pressure, if bleeding does not stop contact MD or go to the ED. Pt does not have any questions. Lindsi Bayliss M 

## 2011-12-01 NOTE — Discharge Summary (Signed)
Patient ID: Jason [name] MRN: [id number] DOB/AGE: November 14, 1965 46 y.o.  Admit date: 11/25/2011 Discharge date: 12/01/2011  Admission Diagnoses:  Principal Problem:  *Septic arthritis of knee, right Active Problems:  DJD (degenerative joint disease)  Boils   Discharge Diagnoses:  Same  Past Medical History  Diagnosis Date  . Complication of anesthesia   . PONV (postoperative nausea and vomiting)   . Bronchitis   . Neuromuscular disorder     carpal tunnel bilateral, ulner nerve surgery  . Anxiety   . Spinal headache     "long time ago"    Surgeries: Procedure(s): IRRIGATION AND DEBRIDEMENT KNEE on 11/25/2011 - 11/26/2011   Consultants:    Discharged Condition: Improved  Hospital Course: Jason [name] is an 46 y.o. male who was admitted 11/25/2011 for operative treatment ofSeptic arthritis of knee, right. Patient has severe unremitting pain that affects sleep, daily activities, and work/hobbies. After pre-op clearance the patient was taken to the operating room on 11/25/2011 - 11/26/2011 and underwent  Procedure(s): IRRIGATION AND DEBRIDEMENT KNEE.    Patient was given perioperative antibiotics: Anti-infectives     Start     Dose/Rate Route Frequency Ordered Stop   12/01/11 0000   ciprofloxacin (CIPRO) 750 MG tablet        750 mg Oral 2 times daily 12/01/11 1258     12/01/11 0000  sulfamethoxazole-trimethoprim (BACTRIM) 400-80 MG per tablet       1 tablet Oral 2 times daily 12/01/11 1259     11/29/11 1200   ciprofloxacin (CIPRO) tablet 750 mg        750 mg Oral 2 times daily 11/29/11 1100     11/25/11 2000   vancomycin (VANCOCIN) IVPB 1000 mg/200 mL premix        1,000 mg 200 mL/hr over 60 Minutes Intravenous Every 8 hours 11/25/11 1335     11/25/11 1200   vancomycin (VANCOCIN) IVPB 1000 mg/200 mL premix        1,000 mg 200 mL/hr over 60 Minutes Intravenous  Once 11/25/11 1149 11/25/11 1444           Patient was given sequential compression devices, early  ambulation, and chemoprophylaxis to prevent DVT.  Patient benefited maximally from hospital stay and there were no complications.    Recent vital signs: Patient Vitals for the past 24 hrs:  BP Temp Temp src Pulse Resp SpO2  12/01/11 0617 123/71 mmHg 98.7 F (37.1 C) Oral 92  16  95 %  11/30/11 2051 130/85 mmHg 100.9 F (38.3 C) Oral 118  18  95 %  11/30/11 1400 135/88 mmHg 98.8 F (37.1 C) Oral 85  18  96 %     Recent laboratory studies: No results found for this basename: WBC:2,HGB:2,HCT:2,PLT:2,NA:2,K:2,CL:2,CO2:2,BUN:2,CREATININE:2,GLUCOSE:2,PT:2,INR:2,CALCIUM,2: in the last 72 hours   Discharge Medications:     Medication List     As of 12/01/2011  1:00 PM    TAKE these medications         ALPRAZolam 1 MG tablet   Commonly known as: XANAX   Take 1 mg by mouth 3 (three) times daily as needed.      AMBIEN 10 MG tablet   Generic drug: zolpidem   Take 10 mg by mouth at bedtime. To help sleep      ciprofloxacin 750 MG tablet   Commonly known as: CIPRO   Take 1 tablet (750 mg total) by mouth 2 (two) times daily.      multivitamin with minerals Tabs  Take 2 tablets by mouth daily. gnc mega men      sulfamethoxazole-trimethoprim 400-80 MG per tablet   Commonly known as: BACTRIM,SEPTRA   Take 1 tablet by mouth 2 (two) times daily.        Diagnostic Studies: Dg Chest 2 View  11/26/2011  *RADIOLOGY REPORT*  Clinical Data: Diaphoresis. Knee infection.  CHEST - 2 VIEW  Comparison: None.  Findings: Right PICC tip projects over the SVC.  Trachea is midline.  Heart size is accentuated by low lung volumes.  Lungs are clear.  No pleural fluid.  IMPRESSION: Low lung volumes.  No acute findings.   Original Report Authenticated By: Reyes Ivan, M.D.    Mr Knee Right W Wo Contrast  11/28/2011  *RADIOLOGY REPORT*  Clinical Data: Knee pain and swelling.  History of recent arthroscopic surgery.  MRI OF THE RIGHT KNEE WITHOUT AND WITH CONTRAST  Technique:  Multiplanar,  multisequence MR imaging was performed both before and after administration of intravenous contrast.  Contrast: 20mL MULTIHANCE GADOBENATE DIMEGLUMINE 529 MG/ML IV SOLN  Comparison: None  Findings: There is a small joint effusion with fairly marked and irregular synovial enhancement.  Findings could be secondary to severe synovitis but cannot exclude septic arthritis.  Joint aspiration is recommended.  The articular cartilage is intact.  Mild tricompartmental degenerative chondrosis.  No osteochondral lesion.  Probable postsurgical changes involving the medial meniscus with partial meniscectomy.  The cruciate and collateral ligaments are intact.  No marrow edema or abnormal marrow enhancement suggest osteomyelitis.  IMPRESSION:  1.  Severe synovitis and small joint effusion.  Cannot exclude septic arthritis. 2.  Tricompartmental degenerative changes. 3.  Probable postoperative changes involving the medial meniscus.   Original Report Authenticated By: P. Loralie Champagne, M.D.    Dg Chest Port 1 View  11/28/2011  *RADIOLOGY REPORT*  Clinical Data: Fever.  Cough.  PORTABLE CHEST - 1 VIEW 11/28/2011 2000 hours:  Comparison: Two-view chest x-ray 11/26/2011.  Findings: Cardiomediastinal silhouette unremarkable.  Lungs clear. Right arm PICC tip has flipped upward, now overlying the junction of the right and left innominate veins.  IMPRESSION:  1.  No acute cardiopulmonary disease. 2.  The right arm PICC tip has flipped upward and is now overlying the expected location of the junction of the innominate veins. This is still a central location.   Original Report Authenticated By: Arnell Sieving, M.D.     Disposition: 01-Home or Self Care      Discharge Orders    Future Orders Please Complete By Expires   Increase activity slowly      Walker       May shower / Bathe      Change dressing (specify)      Comments:   Dressing change as needed.   Call MD for:  temperature >100.4      Call MD for:  severe  uncontrolled pain      Call MD for:  redness, tenderness, or signs of infection (pain, swelling, redness, odor or green/yellow discharge around incision site)      Discharge instructions      Comments:   F/U with [name] in 1 week.         SignedHazle Nordmann. 12/01/2011, 1:00 PM

## 2011-12-01 NOTE — Progress Notes (Signed)
INFECTIOUS DISEASE PROGRESS NOTE  ID: Jason [name] is a 46 y.o. male with   Principal Problem:  *Septic arthritis of knee, right Active Problems:  DJD (degenerative joint disease)  Boils  Subjective: Without complaints  Abtx:  Anti-infectives     Start     Dose/Rate Route Frequency Ordered Stop   11/29/11 1200   ciprofloxacin (CIPRO) tablet 750 mg        750 mg Oral 2 times daily 11/29/11 1100     11/25/11 2000   vancomycin (VANCOCIN) IVPB 1000 mg/200 mL premix        1,000 mg 200 mL/hr over 60 Minutes Intravenous Every 8 hours 11/25/11 1335     11/25/11 1200   vancomycin (VANCOCIN) IVPB 1000 mg/200 mL premix        1,000 mg 200 mL/hr over 60 Minutes Intravenous  Once 11/25/11 1149 11/25/11 1444          Medications:  Scheduled:   . ciprofloxacin  750 mg Oral BID  . vancomycin  1,000 mg Intravenous Q8H  . zolpidem  10 mg Oral QHS    Objective: Vital signs in last 24 hours: Temp:  [98.7 F (37.1 C)-100.9 F (38.3 C)] 98.7 F (37.1 C) (09/16 0617) Pulse Rate:  [85-118] 92  (09/16 0617) Resp:  [16-18] 16  (09/16 0617) BP: (123-135)/(71-88) 123/71 mmHg (09/16 0617) SpO2:  [95 %-96 %] 95 % (09/16 0617)   General appearance: alert, cooperative and no distress Extremities: R shoulder wound- clean. R knee- wound is clean, wam, mod swelling. R calf wound- mild bleeding, no d/c.    Lab Results No results found for this basename: WBC:2,HGB:2,HCT:2,PLATELETS:2,NA:2,K:2,CL:2,CO2:2,BUN:2,CREATININE:2,GLU:2 in the last 72 hours Liver Panel No results found for this basename: PROT:2,ALBUMIN:2,AST:2,ALT:2,ALKPHOS:2,BILITOT:2,BILIDIR:2,IBILI:2 in the last 72 hours Sedimentation Rate  Basename 11/29/11 0520  ESRSEDRATE 36*   C-Reactive Protein  Basename 11/29/11 0520  CRP 19.5*    Microbiology: Recent Results (from the past 240 hour(s))  SURGICAL PCR SCREEN     Status: Normal   Collection Time   11/25/11 10:06 PM      Component Value Range Status Comment   MRSA, PCR NEGATIVE  NEGATIVE Final    Staphylococcus aureus NEGATIVE  NEGATIVE Final   BODY FLUID CULTURE     Status: Normal   Collection Time   11/26/11  2:56 PM      Component Value Range Status Comment   Specimen Description FLUID SYNOVIAL KNEE RIGHT   Final    Special Requests PATIENT ON FOLLOWING VANCOMYCIN   Final    Gram Stain     Final    Value: ABUNDANT WBC PRESENT,BOTH PMN AND MONONUCLEAR     NO ORGANISMS SEEN     Performed at [facility]   Culture NO GROWTH 3 DAYS   Final    Report Status 11/30/2011 FINAL   Final   ANAEROBIC CULTURE     Status: Normal   Collection Time   11/26/11  2:56 PM      Component Value Range Status Comment   Specimen Description FLUID SYNOVIAL KNEE RIGHT   Final    Special Requests PATIENT ON FOLLOWING VANCOMYCIN   Final    Gram Stain     Final    Value: ABUNDANT WBC PRESENT,BOTH PMN AND MONONUCLEAR     NO ORGANISMS SEEN     Performed at [facility]   Culture NO ANAEROBES ISOLATED   Final    Report Status 12/01/2011 FINAL  Final   GRAM STAIN     Status: Normal   Collection Time   11/26/11  2:56 PM      Component Value Range Status Comment   Specimen Description FLUID SYNOVIAL KNEE RIGHT   Final    Special Requests PATIENT ON FOLLOWING VANCOMYCIN   Final    Gram Stain     Final    Value: ABUNDANT WBC PRESENT,BOTH PMN AND MONONUCLEAR     NO ORGANISMS SEEN   Report Status 11/26/2011 FINAL   Final   WOUND CULTURE     Status: Normal   Collection Time   11/28/11  7:10 PM      Component Value Range Status Comment   Specimen Description WOUND AXILLA RIGHT   Final    Special Requests Normal   Final    Gram Stain     Final    Value: NO WBC SEEN     RARE SQUAMOUS EPITHELIAL CELLS PRESENT     NO ORGANISMS SEEN   Culture NO GROWTH 2 DAYS   Final    Report Status 12/01/2011 FINAL   Final   URINE CULTURE     Status: Normal   Collection Time   11/28/11  8:50 PM      Component Value Range Status Comment   Specimen Description URINE,  CLEAN CATCH   Final    Special Requests vancomycin Normal   Final    Culture  Setup Time 11/28/2011 21:41   Final    Colony Count NO GROWTH   Final    Culture NO GROWTH   Final    Report Status 11/30/2011 FINAL   Final     Studies/Results: No results found.   Assessment/Plan: Septic Arthritis Fever  Day 3 cipro  Day 7 vancomycin Cellulitis, skin wounds Would- pull his PIC He does not want to go home with IV anbx Would send him home with bactrim and cipro when afebrile. Routine HIV screening Have him f/u in ID clinic   Jason [name] Infectious Diseases [phone] 12/01/2011, 12:09 PM   LOS: 6 days

## 2011-12-01 NOTE — Progress Notes (Signed)
Right:  No evidence of DVT, superficial thrombosis, or Baker's cyst.  Left:  Negative for DVT in the common femoral vein.  

## 2011-12-01 NOTE — Progress Notes (Signed)
CARE MANAGEMENT NOTE 12/01/2011  Patient:  Jason [name], Jason [name]   Account Number:  [id number]  Date Initiated:  11/30/2011  Documentation initiated by:  [facility]  Subjective/Objective Assessment:   sepsis     Action/Plan:   No HH needs identified. PICC line being removed, patient has roling walker and a cane at home.   Anticipated DC Date:  11/24/2011   Anticipated DC Plan:  HOME/SELF CARE      DC Planning Services  CM consult      Choice offered to / List presented to:             [facility] agency  [facility]   Status of service:  Completed, signed off Medicare Important Message given?   (If response is "NO", the following Medicare IM given date fields will be blank) Date Medicare IM given:   Date Additional Medicare IM given:    Discharge Disposition:  HOME/SELF CARE

## 2011-12-01 NOTE — Progress Notes (Signed)
New order for CSW received over the weekend.  Patient has been assessed last week by CSW and appropriate referrals made. Patient will return home with [facility] and RNCM is aware and is following.  No further CSW needs are identified at this time. CSW will sign off again but will be available if needed   Lupita Leash T. [place] Pugh  [phone]

## 2012-01-07 ENCOUNTER — Encounter: Payer: Self-pay | Admitting: Infectious Diseases

## 2012-01-07 ENCOUNTER — Telehealth: Payer: Self-pay | Admitting: *Deleted

## 2012-01-07 NOTE — Telephone Encounter (Signed)
Called patient to reschedule appt, he no showed today.  He does not have IV antibiotics and he is awaiting his Medicaid, said he would call when that comes through. Wendall Mola CMA

## 2012-04-27 NOTE — Progress Notes (Signed)
This encounter was created in error - please disregard.

## 2014-05-26 HISTORY — PX: ABOVE KNEE LEG AMPUTATION: SUR20

## 2016-05-25 DIAGNOSIS — T50904A Poisoning by unspecified drugs, medicaments and biological substances, undetermined, initial encounter: Secondary | ICD-10-CM | POA: Diagnosis not present

## 2016-05-25 DIAGNOSIS — R000 Tachycardia, unspecified: Secondary | ICD-10-CM | POA: Diagnosis not present

## 2016-05-25 DIAGNOSIS — F332 Major depressive disorder, recurrent severe without psychotic features: Secondary | ICD-10-CM | POA: Diagnosis not present

## 2016-05-25 DIAGNOSIS — F419 Anxiety disorder, unspecified: Secondary | ICD-10-CM | POA: Diagnosis not present

## 2016-05-25 DIAGNOSIS — Z136 Encounter for screening for cardiovascular disorders: Secondary | ICD-10-CM | POA: Diagnosis not present

## 2016-05-25 DIAGNOSIS — G9389 Other specified disorders of brain: Secondary | ICD-10-CM | POA: Diagnosis not present

## 2016-05-25 DIAGNOSIS — R51 Headache: Secondary | ICD-10-CM | POA: Diagnosis not present

## 2016-05-26 DIAGNOSIS — G9389 Other specified disorders of brain: Secondary | ICD-10-CM | POA: Diagnosis not present

## 2016-05-26 DIAGNOSIS — F332 Major depressive disorder, recurrent severe without psychotic features: Secondary | ICD-10-CM | POA: Diagnosis not present

## 2016-05-26 DIAGNOSIS — F419 Anxiety disorder, unspecified: Secondary | ICD-10-CM | POA: Diagnosis not present

## 2016-05-27 DIAGNOSIS — F419 Anxiety disorder, unspecified: Secondary | ICD-10-CM | POA: Diagnosis not present

## 2016-05-27 DIAGNOSIS — F332 Major depressive disorder, recurrent severe without psychotic features: Secondary | ICD-10-CM | POA: Diagnosis not present

## 2016-06-09 DIAGNOSIS — I10 Essential (primary) hypertension: Secondary | ICD-10-CM | POA: Diagnosis not present

## 2016-06-09 DIAGNOSIS — F5101 Primary insomnia: Secondary | ICD-10-CM | POA: Diagnosis not present

## 2016-06-09 DIAGNOSIS — Z89611 Acquired absence of right leg above knee: Secondary | ICD-10-CM | POA: Diagnosis not present

## 2016-07-07 DIAGNOSIS — G8929 Other chronic pain: Secondary | ICD-10-CM | POA: Diagnosis not present

## 2016-07-07 DIAGNOSIS — F5101 Primary insomnia: Secondary | ICD-10-CM | POA: Diagnosis not present

## 2016-07-07 DIAGNOSIS — M79604 Pain in right leg: Secondary | ICD-10-CM | POA: Diagnosis not present

## 2016-07-07 DIAGNOSIS — F419 Anxiety disorder, unspecified: Secondary | ICD-10-CM | POA: Diagnosis not present

## 2016-07-07 DIAGNOSIS — I10 Essential (primary) hypertension: Secondary | ICD-10-CM | POA: Diagnosis not present

## 2016-07-13 ENCOUNTER — Inpatient Hospital Stay (HOSPITAL_COMMUNITY)
Admission: EM | Admit: 2016-07-13 | Discharge: 2016-07-16 | DRG: 287 | Disposition: A | Discharge status: Home / Self Care | Payer: Medicare Other | Attending: Internal Medicine | Admitting: Internal Medicine

## 2016-07-13 ENCOUNTER — Encounter (HOSPITAL_COMMUNITY): Payer: Self-pay

## 2016-07-13 ENCOUNTER — Emergency Department (HOSPITAL_COMMUNITY): Payer: Medicare Other

## 2016-07-13 DIAGNOSIS — I10 Essential (primary) hypertension: Secondary | ICD-10-CM | POA: Diagnosis not present

## 2016-07-13 DIAGNOSIS — Z89611 Acquired absence of right leg above knee: Secondary | ICD-10-CM

## 2016-07-13 DIAGNOSIS — Z91041 Radiographic dye allergy status: Secondary | ICD-10-CM

## 2016-07-13 DIAGNOSIS — F410 Panic disorder [episodic paroxysmal anxiety] without agoraphobia: Secondary | ICD-10-CM | POA: Diagnosis not present

## 2016-07-13 DIAGNOSIS — R079 Chest pain, unspecified: Secondary | ICD-10-CM | POA: Diagnosis present

## 2016-07-13 DIAGNOSIS — Z91048 Other nonmedicinal substance allergy status: Secondary | ICD-10-CM

## 2016-07-13 DIAGNOSIS — Z79899 Other long term (current) drug therapy: Secondary | ICD-10-CM

## 2016-07-13 DIAGNOSIS — F419 Anxiety disorder, unspecified: Secondary | ICD-10-CM

## 2016-07-13 DIAGNOSIS — Z880 Allergy status to penicillin: Secondary | ICD-10-CM

## 2016-07-13 DIAGNOSIS — K219 Gastro-esophageal reflux disease without esophagitis: Secondary | ICD-10-CM | POA: Diagnosis present

## 2016-07-13 DIAGNOSIS — Z881 Allergy status to other antibiotic agents status: Secondary | ICD-10-CM

## 2016-07-13 DIAGNOSIS — Z885 Allergy status to narcotic agent status: Secondary | ICD-10-CM

## 2016-07-13 DIAGNOSIS — R0789 Other chest pain: Principal | ICD-10-CM | POA: Diagnosis present

## 2016-07-13 DIAGNOSIS — Z9049 Acquired absence of other specified parts of digestive tract: Secondary | ICD-10-CM

## 2016-07-13 DIAGNOSIS — Z8249 Family history of ischemic heart disease and other diseases of the circulatory system: Secondary | ICD-10-CM

## 2016-07-13 DIAGNOSIS — E669 Obesity, unspecified: Secondary | ICD-10-CM | POA: Diagnosis not present

## 2016-07-13 DIAGNOSIS — Z765 Malingerer [conscious simulation]: Secondary | ICD-10-CM

## 2016-07-13 DIAGNOSIS — Z888 Allergy status to other drugs, medicaments and biological substances status: Secondary | ICD-10-CM

## 2016-07-13 HISTORY — DX: Essential (primary) hypertension: I10

## 2016-07-13 LAB — I-STAT TROPONIN, ED
TROPONIN I, POC: 0 ng/mL (ref 0.00–0.08)
TROPONIN I, POC: 0 ng/mL (ref 0.00–0.08)

## 2016-07-13 LAB — CBC
HCT: 47.1 % (ref 39.0–52.0)
HEMOGLOBIN: 15.7 g/dL (ref 13.0–17.0)
MCH: 30.3 pg (ref 26.0–34.0)
MCHC: 33.3 g/dL (ref 30.0–36.0)
MCV: 90.9 fL (ref 78.0–100.0)
PLATELETS: 301 10*3/uL (ref 150–400)
RBC: 5.18 MIL/uL (ref 4.22–5.81)
RDW: 14.6 % (ref 11.5–15.5)
WBC: 9.3 10*3/uL (ref 4.0–10.5)

## 2016-07-13 LAB — BASIC METABOLIC PANEL
ANION GAP: 10 (ref 5–15)
BUN: 12 mg/dL (ref 6–20)
CALCIUM: 9 mg/dL (ref 8.9–10.3)
CO2: 22 mmol/L (ref 22–32)
CREATININE: 1.1 mg/dL (ref 0.61–1.24)
Chloride: 105 mmol/L (ref 101–111)
GFR calc non Af Amer: >60 mL/min (ref >60)
Glucose, Bld: 92 mg/dL (ref 65–99)
Potassium: 3.9 mmol/L (ref 3.5–5.1)
SODIUM: 137 mmol/L (ref 135–145)

## 2016-07-13 LAB — D-DIMER, QUANTITATIVE (NOT AT ARMC)

## 2016-07-13 LAB — BRAIN NATRIURETIC PEPTIDE: B NATRIURETIC PEPTIDE 5: 8.3 pg/mL (ref 0.0–100.0)

## 2016-07-13 MED ORDER — SODIUM CHLORIDE 0.9 % IV SOLN
INTRAVENOUS | Status: DC
  Administered 2016-07-13: 20:00:00 via INTRAVENOUS

## 2016-07-13 MED ORDER — LORAZEPAM 2 MG/ML IJ SOLN
1.0000 mg | Freq: Once | INTRAMUSCULAR | Status: DC
  Filled 2016-07-13: qty 1

## 2016-07-13 MED ORDER — MORPHINE SULFATE (PF) 4 MG/ML IV SOLN
4.0000 mg | Freq: Once | INTRAVENOUS | Status: AC
  Administered 2016-07-13: 4 mg via INTRAVENOUS
  Filled 2016-07-13: qty 1

## 2016-07-13 MED ORDER — ONDANSETRON HCL 4 MG/2ML IJ SOLN
4.0000 mg | Freq: Once | INTRAMUSCULAR | Status: AC
  Administered 2016-07-13: 4 mg via INTRAVENOUS
  Filled 2016-07-13: qty 2

## 2016-07-13 MED ORDER — NITROGLYCERIN 2 % TD OINT
1.0000 [in_us] | TOPICAL_OINTMENT | Freq: Four times a day (QID) | TRANSDERMAL | Status: DC
  Administered 2016-07-13: 1 [in_us] via TOPICAL
  Filled 2016-07-13: qty 1

## 2016-07-13 MED ORDER — LORAZEPAM 2 MG/ML IJ SOLN
1.0000 mg | Freq: Once | INTRAMUSCULAR | Status: AC
  Administered 2016-07-13: 1 mg via INTRAVENOUS
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
## 2016-07-13 NOTE — ED Notes (Signed)
Pt called out to report increase in left sided chest and right arm pain, describes as "pressure", radiating through to back.  MD made aware, orders for repeat EKG.
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
## 2016-07-14 ENCOUNTER — Encounter (HOSPITAL_COMMUNITY)
Admission: EM | Disposition: A | Discharge status: Home / Self Care | Payer: Self-pay | Source: Home / Self Care | Attending: Internal Medicine

## 2016-07-14 ENCOUNTER — Encounter (HOSPITAL_COMMUNITY): Payer: Self-pay | Admitting: Family Medicine

## 2016-07-14 DIAGNOSIS — R072 Precordial pain: Secondary | ICD-10-CM | POA: Diagnosis not present

## 2016-07-14 DIAGNOSIS — R079 Chest pain, unspecified: Secondary | ICD-10-CM | POA: Diagnosis present

## 2016-07-14 DIAGNOSIS — I10 Essential (primary) hypertension: Secondary | ICD-10-CM | POA: Diagnosis not present

## 2016-07-14 DIAGNOSIS — Z89611 Acquired absence of right leg above knee: Secondary | ICD-10-CM

## 2016-07-14 HISTORY — DX: Chest pain, unspecified: R07.9

## 2016-07-14 HISTORY — PX: LEFT HEART CATH AND CORONARY ANGIOGRAPHY: CATH118249

## 2016-07-14 LAB — TROPONIN I
Troponin I: <0.03 ng/mL (ref <0.03)
Troponin I: <0.03 ng/mL (ref <0.03)

## 2016-07-14 LAB — RAPID URINE DRUG SCREEN, HOSP PERFORMED
Amphetamines: NOT DETECTED
BARBITURATES: NOT DETECTED
BENZODIAZEPINES: POSITIVE — AB
Cocaine: NOT DETECTED
Opiates: POSITIVE — AB
Tetrahydrocannabinol: NOT DETECTED

## 2016-07-14 LAB — GLUCOSE, CAPILLARY: GLUCOSE-CAPILLARY: 105 mg/dL — AB (ref 65–99)

## 2016-07-14 LAB — PROTIME-INR
INR: 0.99
PROTHROMBIN TIME: 13.1 s (ref 11.4–15.2)

## 2016-07-14 LAB — MRSA PCR SCREENING: MRSA BY PCR: NEGATIVE

## 2016-07-14 SURGERY — LEFT HEART CATH AND CORONARY ANGIOGRAPHY
Anesthesia: LOCAL

## 2016-07-14 MED ORDER — DIPHENHYDRAMINE HCL 50 MG/ML IJ SOLN
INTRAMUSCULAR | Status: DC | PRN
  Administered 2016-07-14: 25 mg via INTRAVENOUS

## 2016-07-14 MED ORDER — SODIUM CHLORIDE 0.9% FLUSH: 3.0000 mL | Freq: Two times a day (BID) | INTRAVENOUS | Status: DC

## 2016-07-14 MED ORDER — SODIUM CHLORIDE 0.9% FLUSH: 3.0000 mL | INTRAVENOUS | Status: DC | PRN

## 2016-07-14 MED ORDER — FENTANYL CITRATE (PF) 100 MCG/2ML IJ SOLN
INTRAMUSCULAR | Status: AC
  Filled 2016-07-14: qty 2

## 2016-07-14 MED ORDER — FENTANYL CITRATE (PF) 100 MCG/2ML IJ SOLN
INTRAMUSCULAR | Status: DC | PRN
  Administered 2016-07-14 (×2): 50 ug via INTRAVENOUS

## 2016-07-14 MED ORDER — ASPIRIN 81 MG PO CHEW: 81.0000 mg | CHEWABLE_TABLET | ORAL | Status: DC

## 2016-07-14 MED ORDER — MIDAZOLAM HCL 2 MG/2ML IJ SOLN
INTRAMUSCULAR | Status: DC | PRN
  Administered 2016-07-14 (×2): 1 mg via INTRAVENOUS

## 2016-07-14 MED ORDER — METHYLPREDNISOLONE SODIUM SUCC 125 MG IJ SOLR
INTRAMUSCULAR | Status: AC
  Filled 2016-07-14: qty 2

## 2016-07-14 MED ORDER — GI COCKTAIL ~~LOC~~
30.0000 mL | Freq: Four times a day (QID) | ORAL | Status: DC | PRN
  Administered 2016-07-14: 30 mL via ORAL
  Filled 2016-07-14: qty 30

## 2016-07-14 MED ORDER — ZOLPIDEM TARTRATE 5 MG PO TABS
5.0000 mg | ORAL_TABLET | Freq: Every evening | ORAL | Status: DC | PRN
  Administered 2016-07-14 – 2016-07-15 (×4): 5 mg via ORAL
  Filled 2016-07-14 (×4): qty 1

## 2016-07-14 MED ORDER — HEPARIN (PORCINE) IN NACL 2-0.9 UNIT/ML-% IJ SOLN
INTRAMUSCULAR | Status: AC
  Filled 2016-07-14: qty 1000

## 2016-07-14 MED ORDER — NITROGLYCERIN IN D5W 200-5 MCG/ML-% IV SOLN
0.0000 ug/min | INTRAVENOUS | Status: DC
Start: 2016-07-14 — End: 2016-07-14
  Administered 2016-07-14: 5 ug/min via INTRAVENOUS
  Filled 2016-07-14: qty 250

## 2016-07-14 MED ORDER — VERAPAMIL HCL 2.5 MG/ML IV SOLN
INTRAVENOUS | Status: AC
  Filled 2016-07-14: qty 2

## 2016-07-14 MED ORDER — ONDANSETRON HCL 4 MG/2ML IJ SOLN: 4.0000 mg | Freq: Four times a day (QID) | INTRAMUSCULAR | Status: DC | PRN

## 2016-07-14 MED ORDER — METHYLPREDNISOLONE SODIUM SUCC 125 MG IJ SOLR
INTRAMUSCULAR | Status: DC | PRN
  Administered 2016-07-14: 125 mg via INTRAVENOUS

## 2016-07-14 MED ORDER — MIDAZOLAM HCL 2 MG/2ML IJ SOLN
INTRAMUSCULAR | Status: AC
  Filled 2016-07-14: qty 2

## 2016-07-14 MED ORDER — PROMETHAZINE HCL 25 MG/ML IJ SOLN
12.5000 mg | Freq: Once | INTRAMUSCULAR | Status: AC
  Administered 2016-07-14: 12.5 mg via INTRAVENOUS
  Filled 2016-07-14: qty 1

## 2016-07-14 MED ORDER — DIPHENHYDRAMINE HCL 25 MG PO CAPS
25.0000 mg | ORAL_CAPSULE | Freq: Four times a day (QID) | ORAL | Status: DC | PRN
  Administered 2016-07-15: 25 mg via ORAL
  Filled 2016-07-14: qty 1

## 2016-07-14 MED ORDER — LIDOCAINE HCL (PF) 1 % IJ SOLN
INTRAMUSCULAR | Status: DC | PRN
  Administered 2016-07-14: 2 mL via SUBCUTANEOUS

## 2016-07-14 MED ORDER — KETOROLAC TROMETHAMINE 30 MG/ML IJ SOLN
30.0000 mg | Freq: Once | INTRAMUSCULAR | Status: AC
  Administered 2016-07-14: 30 mg via INTRAVENOUS
  Filled 2016-07-14: qty 1

## 2016-07-14 MED ORDER — SODIUM CHLORIDE 0.9 % IV SOLN: 250.0000 mL | INTRAVENOUS | Status: DC | PRN

## 2016-07-14 MED ORDER — IBUPROFEN 200 MG PO TABS: 400.0000 mg | ORAL_TABLET | Freq: Four times a day (QID) | ORAL | Status: DC | PRN

## 2016-07-14 MED ORDER — AMITRIPTYLINE HCL 25 MG PO TABS
50.0000 mg | ORAL_TABLET | Freq: Every evening | ORAL | Status: DC | PRN
  Administered 2016-07-14 – 2016-07-15 (×2): 50 mg via ORAL
  Filled 2016-07-14 (×2): qty 2

## 2016-07-14 MED ORDER — GABAPENTIN 800 MG PO TABS: 800.0000 mg | ORAL_TABLET | Freq: Three times a day (TID) | ORAL | Status: DC

## 2016-07-14 MED ORDER — SODIUM CHLORIDE 0.9 % WEIGHT BASED INFUSION: 1.0000 mL/kg/h | INTRAVENOUS | Status: DC

## 2016-07-14 MED ORDER — LORAZEPAM 1 MG PO TABS
1.0000 mg | ORAL_TABLET | Freq: Four times a day (QID) | ORAL | Status: DC | PRN
  Administered 2016-07-15: 1 mg via ORAL
  Filled 2016-07-14: qty 1

## 2016-07-14 MED ORDER — LIDOCAINE HCL (PF) 1 % IJ SOLN
INTRAMUSCULAR | Status: AC
  Filled 2016-07-14: qty 30

## 2016-07-14 MED ORDER — SODIUM CHLORIDE 0.9 % IV SOLN
250.0000 mL | INTRAVENOUS | Status: DC | PRN
Start: 2016-07-14 — End: 2016-07-16

## 2016-07-14 MED ORDER — ONDANSETRON HCL 4 MG/2ML IJ SOLN
4.0000 mg | Freq: Four times a day (QID) | INTRAMUSCULAR | Status: DC | PRN
  Administered 2016-07-14: 4 mg via INTRAVENOUS
  Filled 2016-07-14: qty 2

## 2016-07-14 MED ORDER — HEPARIN (PORCINE) IN NACL 2-0.9 UNIT/ML-% IJ SOLN
INTRAMUSCULAR | Status: DC | PRN
  Administered 2016-07-14: 1000 mL

## 2016-07-14 MED ORDER — GABAPENTIN 400 MG PO CAPS
800.0000 mg | ORAL_CAPSULE | Freq: Two times a day (BID) | ORAL | Status: DC
  Administered 2016-07-14 – 2016-07-16 (×6): 800 mg via ORAL
  Filled 2016-07-14 (×6): qty 2

## 2016-07-14 MED ORDER — SODIUM CHLORIDE 0.9 % WEIGHT BASED INFUSION
3.0000 mL/kg/h | INTRAVENOUS | Status: DC
  Administered 2016-07-14: 3 mL/kg/h via INTRAVENOUS

## 2016-07-14 MED ORDER — ASPIRIN 81 MG PO CHEW
324.0000 mg | CHEWABLE_TABLET | Freq: Once | ORAL | Status: AC
  Administered 2016-07-14: 324 mg via ORAL
  Filled 2016-07-14: qty 4

## 2016-07-14 MED ORDER — SODIUM CHLORIDE 0.9 % IV SOLN
INTRAVENOUS | Status: AC
  Administered 2016-07-14: 19:00:00 via INTRAVENOUS

## 2016-07-14 MED ORDER — VERAPAMIL HCL 2.5 MG/ML IV SOLN
INTRAVENOUS | Status: DC | PRN
  Administered 2016-07-14: 10 mL via INTRA_ARTERIAL

## 2016-07-14 MED ORDER — HEPARIN SODIUM (PORCINE) 1000 UNIT/ML IJ SOLN
INTRAMUSCULAR | Status: DC | PRN
  Administered 2016-07-14: 5000 [IU] via INTRAVENOUS

## 2016-07-14 MED ORDER — LORAZEPAM 0.5 MG PO TABS
0.5000 mg | ORAL_TABLET | Freq: Once | ORAL | Status: AC
Start: 2016-07-14 — End: 2016-07-14
  Administered 2016-07-14: 0.5 mg via ORAL
  Filled 2016-07-14: qty 1

## 2016-07-14 MED ORDER — LORAZEPAM 2 MG/ML IJ SOLN
1.0000 mg | Freq: Once | INTRAMUSCULAR | Status: AC | PRN
  Administered 2016-07-14: 2 mg via INTRAVENOUS
  Filled 2016-07-14: qty 1

## 2016-07-14 MED ORDER — FAMOTIDINE IN NACL 20-0.9 MG/50ML-% IV SOLN
INTRAVENOUS | Status: AC
  Filled 2016-07-14: qty 50

## 2016-07-14 MED ORDER — GABAPENTIN 400 MG PO CAPS
1600.0000 mg | ORAL_CAPSULE | Freq: Every day | ORAL | Status: DC
  Administered 2016-07-14 – 2016-07-15 (×2): 1600 mg via ORAL
  Filled 2016-07-14 (×3): qty 4

## 2016-07-14 MED ORDER — ENOXAPARIN SODIUM 40 MG/0.4ML ~~LOC~~ SOLN
40.0000 mg | Freq: Every day | SUBCUTANEOUS | Status: DC
  Administered 2016-07-14: 40 mg via SUBCUTANEOUS
  Filled 2016-07-14: qty 0.4

## 2016-07-14 MED ORDER — ACETAMINOPHEN 325 MG PO TABS
650.0000 mg | ORAL_TABLET | ORAL | Status: DC | PRN
  Administered 2016-07-14: 650 mg via ORAL
  Filled 2016-07-14: qty 2

## 2016-07-14 MED ORDER — IOPAMIDOL (ISOVUE-370) INJECTION 76%
INTRAVENOUS | Status: DC | PRN
  Administered 2016-07-14: 70 mL via INTRA_ARTERIAL

## 2016-07-14 MED ORDER — IOPAMIDOL (ISOVUE-370) INJECTION 76%
INTRAVENOUS | Status: AC
  Filled 2016-07-14: qty 100

## 2016-07-14 MED ORDER — SODIUM CHLORIDE 0.9% FLUSH
3.0000 mL | Freq: Two times a day (BID) | INTRAVENOUS | Status: DC
  Administered 2016-07-14 – 2016-07-16 (×4): 3 mL via INTRAVENOUS

## 2016-07-14 MED ORDER — ENOXAPARIN SODIUM 40 MG/0.4ML ~~LOC~~ SOLN
40.0000 mg | SUBCUTANEOUS | Status: DC
  Administered 2016-07-15 – 2016-07-16 (×2): 40 mg via SUBCUTANEOUS
  Filled 2016-07-14 (×2): qty 0.4

## 2016-07-14 MED ORDER — MORPHINE SULFATE (PF) 2 MG/ML IV SOLN
1.0000 mg | INTRAVENOUS | Status: DC | PRN
  Administered 2016-07-14 – 2016-07-15 (×7): 2 mg via INTRAVENOUS
  Filled 2016-07-14 (×8): qty 1

## 2016-07-14 MED ORDER — LORAZEPAM 0.5 MG PO TABS
0.5000 mg | ORAL_TABLET | Freq: Four times a day (QID) | ORAL | Status: DC | PRN
  Administered 2016-07-14: 0.5 mg via ORAL
  Filled 2016-07-14: qty 1

## 2016-07-14 MED ORDER — DIPHENHYDRAMINE HCL 50 MG/ML IJ SOLN
INTRAMUSCULAR | Status: AC
  Filled 2016-07-14: qty 1

## 2016-07-14 MED ORDER — ACETAMINOPHEN 325 MG PO TABS
650.0000 mg | ORAL_TABLET | ORAL | Status: DC | PRN
  Administered 2016-07-15: 650 mg via ORAL
  Filled 2016-07-14: qty 2

## 2016-07-14 MED ORDER — FAMOTIDINE IN NACL 20-0.9 MG/50ML-% IV SOLN
INTRAVENOUS | Status: DC | PRN
Start: 2016-07-14 — End: 2016-07-14
  Administered 2016-07-14: 20 mg via INTRAVENOUS

## 2016-07-14 SURGICAL SUPPLY — 10 items
CATH INFINITI 5 FR JL3.5 (CATHETERS) ×2 IMPLANT
CATH INFINITI JR4 5F (CATHETERS) ×2 IMPLANT
DEVICE RAD COMP TR BAND LRG (VASCULAR PRODUCTS) ×2 IMPLANT
GLIDESHEATH SLEND A-KIT 6F 22G (SHEATH) ×2 IMPLANT
GUIDEWIRE INQWIRE 1.5J.035X260 (WIRE) ×1 IMPLANT
INQWIRE 1.5J .035X260CM (WIRE) ×2
KIT HEART LEFT (KITS) ×2 IMPLANT
PACK CARDIAC CATHETERIZATION (CUSTOM PROCEDURE TRAY) ×2 IMPLANT
TRANSDUCER W/STOPCOCK (MISCELLANEOUS) ×2 IMPLANT
TUBING CIL FLEX 10 FLL-RA (TUBING) ×2 IMPLANT

## 2016-07-14 NOTE — H&P (View-Only) (Signed)
Cardiology Consult    Patient ID: Jason [name] MRN: [id number], DOB/AGE: January 18, 1966   Admit date: 07/13/2016 Date of Consult: 07/14/2016  Primary Physician: PROVIDER NOT [place] Reason for Consult: Chest pain Primary Cardiologist: New Requesting Provider: Dr. Loleta Books  History of Present Illness    Jason [name] is a 51 y.o. male who is being seen today for the evaluation of chest pain at the request of Dr. Loleta Books. The patient has a Past medical history significant for hypertension, obesity and right AKA.   The patient developed chest pressure at about 5 pm yesterday that radiates to the back, left jaw and left arm. He has had associated intermittent nausea, cold sweats, no shortness of breath. He has never had this discomfort before. He was given nitroglycerin by EMS which helped. He is currently on a nitroglycerin drip which he feels like it is helping although he still maintains this constant pressure 5-6/10. He has a past medical history of hypertension, although he was able to come off his antihypertensive medication after dietary changes and improvement in his blood pressures. He has no history of diabetes, stroke or MI although he does state that he had a cardiac catheterization at some point years ago in another state that he does not recall. He also thinks that he was diagnosed with some type of cardiac abnormality at the age of 89 although this has not been followed. He has had no recent apnea, palpitations, edema, or dyspnea on exertion. He was adopted but is aware of his natural family history which includes a father that died of MI at age 17, a sister with long QT syndrome and permanent pacemaker, and another sister with an MI at unknown age. The patient has never smoked and only drinks an occasional beer.  BNP 8.3,   d-dimer < 0.27,   troponins 0.00, 0.00,<0.03,  electrolytes and kidney function normal Chest x-ray shows no acute abnormalities   Past Medical History    Past Medical History:  Diagnosis Date  . Anxiety   . Bronchitis   . Complication of anesthesia   . Hypertension   . Neuromuscular disorder ([place])    carpal tunnel bilateral, ulner nerve surgery  . PONV (postoperative nausea and vomiting)   . Spinal headache    "long time ago"    Past Surgical History:  Procedure Laterality Date  . APPENDECTOMY    . CHOLECYSTECTOMY    . IRRIGATION AND DEBRIDEMENT KNEE  11/26/2011   Procedure: IRRIGATION AND DEBRIDEMENT KNEE;  Surgeon: Kerin Salen, MD;  Location: [facility];  Service: Orthopedics;  Laterality: Right;  . KNEE ARTHROSCOPY  10/24/2011   Procedure: ARTHROSCOPY KNEE;  Surgeon: Kerin Salen, MD;  Location: [facility];  Service: Orthopedics;  Laterality: Right;  . KNEE SURGERY     7 knee surgeries on right, and 4 knee surgeries left  . SHOULDER ARTHROSCOPY  07/29/2011   Procedure: ARTHROSCOPY SHOULDER;  Surgeon: Mcarthur Rossetti, MD;  Location: [facility];  Service: Orthopedics;  Laterality: Left;  Left shoulder arthroscopy with minimal debridement, left wrist steroid injection  . SHOULDER SURGERY     3 surgeries on right, 2 surgeries on left  . ULNAR NERVE REPAIR       Allergies  Allergies  Allergen Reactions  . Iodine Rash    Able to eat shellfish. Reports localized reaction at IV site. Able to tolerate with Benadryl.   Clementeen Hoof [Iodinated Diagnostic Agents] Anaphylaxis  . Metrizamide Anaphylaxis  .  Other Other (See Comments)    Under no circumstances will the patient agree to a PICC line  . Reglan [Metoclopramide] Anaphylaxis  . Lidocaine Other (See Comments) and Rash    Pt not sure if this is an actual allergy - might have been a one time incident  . Morphine Swelling and Rash    Local reaction to IV being pushed too fast  . Vancomycin Rash    Has had vancomycin since this reaction and had no reaction at all  . Codeine Hives  . Propoxyphene Hives  . Amoxicillin Rash    Patient denies true reaction  . Penicillins Rash    Has  patient had a PCN reaction causing immediate rash, facial/tongue/throat swelling, SOB or lightheadedness with hypotension: Yes Has patient had a PCN reaction causing severe rash involving mucus membranes or skin necrosis: No Has patient had a PCN reaction that required hospitalization No Has patient had a PCN reaction occurring within the last 10 years: No If all of the above answers are "NO", then may proceed with Cephalosporin use.   . Tape Rash    Inpatient Medications    . enoxaparin (LOVENOX) injection  40 mg Subcutaneous Daily  . gabapentin  800 mg Oral BID WC   And  . gabapentin  1,600 mg Oral QHS    Family History    Family History  Problem Relation Age of Onset  . Heart attack Father   . Heart attack Sister   . Arrhythmia Sister     Long QT syndrome, PPM     Social History    Social History   Social History  . Marital status: Divorced    Spouse name: N/A  . Number of children: N/A  . Years of education: N/A   Occupational History  . Not on file.   Social History Main Topics  . Smoking status: Never Smoker  . Smokeless tobacco: Never Used  . Alcohol use No  . Drug use: No  . Sexual activity: Yes   Other Topics Concern  . Not on file   Social History Narrative  . No narrative on file     Review of Systems    General:  No chills, fever, night sweats or weight changes.  Cardiovascular:  Positive for chest pain as above, no dyspnea on exertion, edema, orthopnea, palpitations, paroxysmal nocturnal dyspnea. Dermatological: No rash, lesions/masses Respiratory: No cough, dyspnea Urologic: No hematuria, dysuria Abdominal:  occasional nausea, no vomiting, diarrhea, bright red blood per rectum, melena, or hematemesis Neurologic:  No visual changes, wkns, changes in mental status. All other systems reviewed and are otherwise negative except as noted above.  Physical Exam   Blood pressure 114/66, pulse 97, temperature 97.8 F (36.6 C), resp. rate 13,  height 5\' 10"  (1.778 m), weight 228 lb 8 oz (103.6 kg), SpO2 95 %.  General: Pleasant, NAD Psych: Normal affect. Neuro: Alert and oriented X 3. Moves all extremities spontaneously. HEENT: Normal  Neck: Supple without bruits or JVD. Lungs:  Resp regular and unlabored, CTA. Heart: RRR no s3, s4, or murmurs. Abdomen: Soft, non-tender, non-distended, BS + x 4.  Extremities: No clubbing, cyanosis or edema. DP/PT/Radials 2+ and equal bilaterally.Right AKA  Labs    Troponin [facility] of Care Test)  Recent Labs  07/13/16 2204  TROPIPOC 0.00    Recent Labs  07/14/16 0324 07/14/16 0630  TROPONINI <0.03 <0.03   Lab Results  Component Value Date   WBC 9.3 07/13/2016   HGB 15.7  07/13/2016   HCT 47.1 07/13/2016   MCV 90.9 07/13/2016   PLT 301 07/13/2016     Recent Labs Lab 07/13/16 1838  NA 137  K 3.9  CL 105  CO2 22  BUN 12  CREATININE 1.10  CALCIUM 9.0  GLUCOSE 92   No results found for: CHOL, HDL, LDLCALC, TRIG Lab Results  Component Value Date   DDIMER <0.27 07/13/2016     Radiology Studies    Dg Chest Port 1 View  Result Date: 07/13/2016 CLINICAL DATA:  Chest pain EXAM: PORTABLE CHEST 1 VIEW COMPARISON:  11/28/2011 FINDINGS: Cardiac shadow is within normal limits. The lungs are hypoinflated with crowding of the vascular markings. No focal infiltrate or sizable effusion is seen. No bony abnormality is noted. IMPRESSION: Poor inspiratory effort.  No acute abnormality seen. Electronically Signed   By: Inez Catalina M.D.   On: 07/13/2016 18:46    EKG & Cardiac Imaging   EKG: Sinus tachycardia, 118 BPM, Left posterior fascicular block, QTC 447, follow-up EKG unchanged  Echocardiogram: None on file  Assessment & Plan    Chest pain -Unclear cardiac history, but no MI or diagnosed CAD -EKG without acute ischemic changes -Troponins negative 3 -D-dimer negative -Chest x-ray was without acute abnormalities -CVD risk factors include family history, obesity,  hypertension which is now well controlled without medication  Hypertension -Currently well controlled on no medications   Signed, Daune Perch, NP-C 07/14/2016, 7:58 AM Pager: [phone]  I have seen and examined the patient along with Daune Perch, NP-C.  I have reviewed the chart, notes and new data.  I agree with NP's note.  Key new complaints: ongoing chest pressure  Key examination changes: no overt CHF or arrhythmia, possible S4 Key new findings / data: normal biomarkers, nonspecific inferior repolarization changes on ECG. QTc 447 ms.  PLAN: NTG responsive chest pain with ongoing symptoms, but without high risk ECG or lab markers. Poor candidate for noninvasive testing (CTA or nuclear scan) due to body habitus. His BMI underestimates his degree of obesity due to AKA and he has a big chest. I am afraid noninvasive tests will be non-diagnostic. Recommend coronary angiography. This procedure has been fully reviewed with the patient and written informed consent has been obtained. Hard to corroborate or further investigate his other reports (he has two sisters with defibrillators for LQT and states that he was recommended to receive on preventively as well; reports cath at age 60 and recommendation for stent because one artery was bigger than the other - coronary stents were not available in the 1980s). Problems with anxiety overlap with his somatic complaints. It's best we settle the issue of CAD early.  Sanda Klein, MD, [place] [phone] 07/14/2016, 8:05 AM
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
## 2016-07-14 NOTE — Progress Notes (Addendum)
Patient admitted from ED with RN. Patient states he continues to have 5/10 chest pain that is not radiating. He is awake and oriented. Nitro IV infusing at 10 mcg. Vital signs WNL, will continue to monitor.

## 2016-07-14 NOTE — Consult Note (Signed)
Cardiology Consult    Patient ID: STEPFON [name] MRN: [id number], DOB/AGE: 03-24-1965   Admit date: 07/13/2016 Date of Consult: 07/14/2016  Primary Physician: PROVIDER NOT [place] Reason for Consult: Chest pain Primary Cardiologist: New Requesting Provider: Dr. Loleta Books  History of Present Illness    Jason [name] is a 51 y.o. male who is being seen today for the evaluation of chest pain at the request of Dr. Loleta Books. The patient has a Past medical history significant for hypertension, obesity and right AKA.   The patient developed chest pressure at about 5 pm yesterday that radiates to the back, left jaw and left arm. He has had associated intermittent nausea, cold sweats, no shortness of breath. He has never had this discomfort before. He was given nitroglycerin by EMS which helped. He is currently on a nitroglycerin drip which he feels like it is helping although he still maintains this constant pressure 5-6/10. He has a past medical history of hypertension, although he was able to come off his antihypertensive medication after dietary changes and improvement in his blood pressures. He has no history of diabetes, stroke or MI although he does state that he had a cardiac catheterization at some point years ago in another state that he does not recall. He also thinks that he was diagnosed with some type of cardiac abnormality at the age of 24 although this has not been followed. He has had no recent apnea, palpitations, edema, or dyspnea on exertion. He was adopted but is aware of his natural family history which includes a father that died of MI at age 14, a sister with long QT syndrome and permanent pacemaker, and another sister with an MI at unknown age. The patient has never smoked and only drinks an occasional beer.  BNP 8.3,   d-dimer < 0.27,   troponins 0.00, 0.00,<0.03,  electrolytes and kidney function normal Chest x-ray shows no acute abnormalities   Past Medical History    Past Medical History:  Diagnosis Date  . Anxiety   . Bronchitis   . Complication of anesthesia   . Hypertension   . Neuromuscular disorder ([place])    carpal tunnel bilateral, ulner nerve surgery  . PONV (postoperative nausea and vomiting)   . Spinal headache    "long time ago"    Past Surgical History:  Procedure Laterality Date  . APPENDECTOMY    . CHOLECYSTECTOMY    . IRRIGATION AND DEBRIDEMENT KNEE  11/26/2011   Procedure: IRRIGATION AND DEBRIDEMENT KNEE;  Surgeon: Kerin Salen, MD;  Location: [place];  Service: Orthopedics;  Laterality: Right;  . KNEE ARTHROSCOPY  10/24/2011   Procedure: ARTHROSCOPY KNEE;  Surgeon: Kerin Salen, MD;  Location: [place];  Service: Orthopedics;  Laterality: Right;  . KNEE SURGERY     7 knee surgeries on right, and 4 knee surgeries left  . SHOULDER ARTHROSCOPY  07/29/2011   Procedure: ARTHROSCOPY SHOULDER;  Surgeon: Mcarthur Rossetti, MD;  Location: [place];  Service: Orthopedics;  Laterality: Left;  Left shoulder arthroscopy with minimal debridement, left wrist steroid injection  . SHOULDER SURGERY     3 surgeries on right, 2 surgeries on left  . ULNAR NERVE REPAIR       Allergies  Allergies  Allergen Reactions  . Iodine Rash    Able to eat shellfish. Reports localized reaction at IV site. Able to tolerate with Benadryl.   Clementeen Hoof [Iodinated Diagnostic Agents] Anaphylaxis  . Metrizamide Anaphylaxis  .  Other Other (See Comments)    Under no circumstances will the patient agree to a PICC line  . Reglan [Metoclopramide] Anaphylaxis  . Lidocaine Other (See Comments) and Rash    Pt not sure if this is an actual allergy - might have been a one time incident  . Morphine Swelling and Rash    Local reaction to IV being pushed too fast  . Vancomycin Rash    Has had vancomycin since this reaction and had no reaction at all  . Codeine Hives  . Propoxyphene Hives  . Amoxicillin Rash    Patient denies true reaction  . Penicillins Rash    Has  patient had a PCN reaction causing immediate rash, facial/tongue/throat swelling, SOB or lightheadedness with hypotension: Yes Has patient had a PCN reaction causing severe rash involving mucus membranes or skin necrosis: No Has patient had a PCN reaction that required hospitalization No Has patient had a PCN reaction occurring within the last 10 years: No If all of the above answers are "NO", then may proceed with Cephalosporin use.   . Tape Rash    Inpatient Medications    . enoxaparin (LOVENOX) injection  40 mg Subcutaneous Daily  . gabapentin  800 mg Oral BID WC   And  . gabapentin  1,600 mg Oral QHS    Family History    Family History  Problem Relation Age of Onset  . Heart attack Father   . Heart attack Sister   . Arrhythmia Sister     Long QT syndrome, PPM     Social History    Social History   Social History  . Marital status: Divorced    Spouse name: N/A  . Number of children: N/A  . Years of education: N/A   Occupational History  . Not on file.   Social History Main Topics  . Smoking status: Never Smoker  . Smokeless tobacco: Never Used  . Alcohol use No  . Drug use: No  . Sexual activity: Yes   Other Topics Concern  . Not on file   Social History Narrative  . No narrative on file     Review of Systems    General:  No chills, fever, night sweats or weight changes.  Cardiovascular:  Positive for chest pain as above, no dyspnea on exertion, edema, orthopnea, palpitations, paroxysmal nocturnal dyspnea. Dermatological: No rash, lesions/masses Respiratory: No cough, dyspnea Urologic: No hematuria, dysuria Abdominal:  occasional nausea, no vomiting, diarrhea, bright red blood per rectum, melena, or hematemesis Neurologic:  No visual changes, wkns, changes in mental status. All other systems reviewed and are otherwise negative except as noted above.  Physical Exam   Blood pressure 114/66, pulse 97, temperature 97.8 F (36.6 C), resp. rate 13,  height 5\' 10"  (1.778 m), weight 228 lb 8 oz (103.6 kg), SpO2 95 %.  General: Pleasant, NAD Psych: Normal affect. Neuro: Alert and oriented X 3. Moves all extremities spontaneously. HEENT: Normal  Neck: Supple without bruits or JVD. Lungs:  Resp regular and unlabored, CTA. Heart: RRR no s3, s4, or murmurs. Abdomen: Soft, non-tender, non-distended, BS + x 4.  Extremities: No clubbing, cyanosis or edema. DP/PT/Radials 2+ and equal bilaterally.Right AKA  Labs    Troponin [facility] Test)  Recent Labs  07/13/16 2204  TROPIPOC 0.00    Recent Labs  07/14/16 0324 07/14/16 0630  TROPONINI <0.03 <0.03   Lab Results  Component Value Date   WBC 9.3 07/13/2016   HGB 15.7  07/13/2016   HCT 47.1 07/13/2016   MCV 90.9 07/13/2016   PLT 301 07/13/2016     Recent Labs Lab 07/13/16 1838  NA 137  K 3.9  CL 105  CO2 22  BUN 12  CREATININE 1.10  CALCIUM 9.0  GLUCOSE 92   No results found for: CHOL, HDL, LDLCALC, TRIG Lab Results  Component Value Date   DDIMER <0.27 07/13/2016     Radiology Studies    Dg Chest Port 1 View  Result Date: 07/13/2016 CLINICAL DATA:  Chest pain EXAM: PORTABLE CHEST 1 VIEW COMPARISON:  11/28/2011 FINDINGS: Cardiac shadow is within normal limits. The lungs are hypoinflated with crowding of the vascular markings. No focal infiltrate or sizable effusion is seen. No bony abnormality is noted. IMPRESSION: Poor inspiratory effort.  No acute abnormality seen. Electronically Signed   By: Inez Catalina M.D.   On: 07/13/2016 18:46    EKG & Cardiac Imaging   EKG: Sinus tachycardia, 118 BPM, Left posterior fascicular block, QTC 447, follow-up EKG unchanged  Echocardiogram: None on file  Assessment & Plan    Chest pain -Unclear cardiac history, but no MI or diagnosed CAD -EKG without acute ischemic changes -Troponins negative 3 -D-dimer negative -Chest x-ray was without acute abnormalities -CVD risk factors include family history, obesity,  hypertension which is now well controlled without medication  Hypertension -Currently well controlled on no medications   Signed, Daune Perch, NP-C 07/14/2016, 7:58 AM Pager: [phone]  I have seen and examined the patient along with Daune Perch, NP-C.  I have reviewed the chart, notes and new data.  I agree with NP's note.  Key new complaints: ongoing chest pressure  Key examination changes: no overt CHF or arrhythmia, possible S4 Key new findings / data: normal biomarkers, nonspecific inferior repolarization changes on ECG. QTc 447 ms.  PLAN: NTG responsive chest pain with ongoing symptoms, but without high risk ECG or lab markers. Poor candidate for noninvasive testing (CTA or nuclear scan) due to body habitus. His BMI underestimates his degree of obesity due to AKA and he has a big chest. I am afraid noninvasive tests will be non-diagnostic. Recommend coronary angiography. This procedure has been fully reviewed with the patient and written informed consent has been obtained. Hard to corroborate or further investigate his other reports (he has two sisters with defibrillators for LQT and states that he was recommended to receive on preventively as well; reports cath at age 50 and recommendation for stent because one artery was bigger than the other - coronary stents were not available in the 1980s). Problems with anxiety overlap with his somatic complaints. It's best we settle the issue of CAD early.  Sanda Klein, MD, [place] [phone] 07/14/2016, 8:05 AM

## 2016-07-14 NOTE — Interval H&P Note (Signed)
Cath Lab Visit (complete for each Cath Lab visit)  Clinical Evaluation Leading to the Procedure:   ACS: Yes.    Non-ACS:    Anginal Classification: CCS [name]  Anti-ischemic medical therapy: Minimal Therapy (1 class of medications)  Non-Invasive Test Results: No non-invasive testing performed  Prior CABG: No previous CABG      History and Physical Interval Note:  07/14/2016 5:16 PM  Jason [name]  has presented today for surgery, with the diagnosis of cp  The various methods of treatment have been discussed with the patient and family. After consideration of risks, benefits and other options for treatment, the patient has consented to  Procedure(s): Left Heart Cath and Coronary Angiography (N/A) as a surgical intervention .  The patient's history has been reviewed, patient examined, no change in status, stable for surgery.  I have reviewed the patient's chart and labs.  Questions were answered to the patient's satisfaction.     Jason [name]

## 2016-07-14 NOTE — Progress Notes (Signed)
PROGRESS NOTE    Jason [name]  WIO:[id number] DOB: 1965-11-17 DOA: 07/13/2016 PCP: PROVIDER NOT IN SYSTEM    Brief Narrative: XERXES AGRUSA is a 51 y.o. 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 Initial ECG showed new LP FB, otherwise sinus tachycardia and troponin was negative. Cardiac enzymes negative. CXR negative.  Cardiology consulted for recommendations and he is scheduled for cath this afternoon.   Assessment & Plan:   Principal Problem:   Chest pain Active Problems:   Essential hypertension   S/P AKA (above knee amputation) unilateral, right (HCC)   Chest pain.  Intermittent , ntg gtt is helping.  Cardiology consulted and recommendations for cardiac cath this afternoon.  Trend troponins.    Hypertension: controlled.      DVT prophylaxis: lovenox.  Code Status:  Full code.  Family Communication: none at bedside.  Disposition Plan: pending cardiac cath.   Consultants:  Cardiology.    Procedures: cath scheduled today.    Antimicrobials: none.    Subjective: Intermittent chest pain.   Objective: Vitals:   07/14/16 0500 07/14/16 0518 07/14/16 0617 07/14/16 0854  BP:  (!) 99/55 114/66 98/62  Pulse: (!) 105 (!) 102 97 97  Resp: 11 13 13  (!) 9  Temp:    98.3 F (36.8 C)  TempSrc:    Oral  SpO2: 93% (!) 80% 95% 92%  Weight:      Height:        Intake/Output Summary (Last 24 hours) at 07/14/16 0943 Last data filed at 07/14/16 0600  Gross per 24 hour  Intake            35.83 ml  Output              200 ml  Net          -164.17 ml   Filed Weights   07/14/16 0111 07/14/16 0436  Weight: 103.6 kg (228 lb 4.8 oz) 103.6 kg (228 lb 8 oz)    Examination:  General exam: Appears calm and comfortable  Respiratory system: Clear to auscultation. Respiratory effort normal. Cardiovascular system: S1 & S2 heard, RRR. No JVD, murmurs, rubs, gallops or clicks. No pedal  edema. Gastrointestinal system: Abdomen is nondistended, soft and nontender. No organomegaly or masses felt. Normal bowel sounds heard. Central nervous system: Alert and oriented. No focal neurological deficits. Extremities: Symmetric 5 x 5 power. Skin: No rashes, lesions or ulcers Psychiatry: Judgement and insight appear normal. Mood & affect appropriate.     Data Reviewed: I have personally reviewed following labs and imaging studies  CBC:  Recent Labs Lab 07/13/16 1838  WBC 9.3  HGB 15.7  HCT 47.1  MCV 90.9  PLT 426   Basic Metabolic Panel:  Recent Labs Lab 07/13/16 1838  NA 137  K 3.9  CL 105  CO2 22  GLUCOSE 92  BUN 12  CREATININE 1.10  CALCIUM 9.0   GFR: Estimated Creatinine Clearance: 99 mL/min (by C-G formula based on SCr of 1.1 mg/dL). Liver Function Tests: No results for input(s): AST, ALT, ALKPHOS, BILITOT, PROT, ALBUMIN in the last 168 hours. No results for input(s): LIPASE, AMYLASE in the last 168 hours. No results for input(s): AMMONIA in the last 168 hours. Coagulation Profile: No results for input(s): INR, PROTIME in the last 168 hours. Cardiac Enzymes:  Recent Labs Lab 07/14/16 0324 07/14/16 0630  TROPONINI <0.03 <0.03   BNP (last 3  results) No results for input(s): PROBNP in the last 8760 hours. HbA1C: No results for input(s): HGBA1C in the last 72 hours. CBG:  Recent Labs Lab 07/14/16 0447  GLUCAP 105*   Lipid Profile: No results for input(s): CHOL, HDL, LDLCALC, TRIG, CHOLHDL, LDLDIRECT in the last 72 hours. Thyroid Function Tests: No results for input(s): TSH, T4TOTAL, FREET4, T3FREE, THYROIDAB in the last 72 hours. Anemia Panel: No results for input(s): VITAMINB12, FOLATE, FERRITIN, TIBC, IRON, RETICCTPCT in the last 72 hours. Sepsis Labs: No results for input(s): PROCALCITON, LATICACIDVEN in the last 168 hours.  Recent Results (from the past 240 hour(s))  MRSA PCR Screening     Status: None   Collection Time: 07/14/16   1:05 AM  Result Value Ref Range Status   MRSA by PCR NEGATIVE NEGATIVE Final    Comment:        The GeneXpert MRSA Assay (FDA approved for NASAL specimens only), is one component of a comprehensive MRSA colonization surveillance program. It is not intended to diagnose MRSA infection nor to guide or monitor treatment for MRSA infections.          Radiology Studies: Dg Chest Port 1 View  Result Date: 07/13/2016 CLINICAL DATA:  Chest pain EXAM: PORTABLE CHEST 1 VIEW COMPARISON:  11/28/2011 FINDINGS: Cardiac shadow is within normal limits. The lungs are hypoinflated with crowding of the vascular markings. No focal infiltrate or sizable effusion is seen. No bony abnormality is noted. IMPRESSION: Poor inspiratory effort.  No acute abnormality seen. Electronically Signed   By: Inez Catalina M.D.   On: 07/13/2016 18:46        Scheduled Meds: . enoxaparin (LOVENOX) injection  40 mg Subcutaneous Daily  . gabapentin  800 mg Oral BID WC   And  . gabapentin  1,600 mg Oral QHS   Continuous Infusions: . nitroGLYCERIN 25 mcg/min (07/14/16 0139)     LOS: 0 days    Time spent: 30 minutes.     Hosie Poisson, MD [facility] Pager [phone]  If 7PM-7AM, please contact night-coverage [email or web address] Password [facility] 07/14/2016, 9:43 AM

## 2016-07-14 NOTE — Progress Notes (Signed)
Pt stated he was having sudden onset sharpe chest pain 10/10, that is radiating to his back and down his left arm. He stated that his left arm felt "tight". IV nitro titrated up from 20 mcg to 25 mcg. Vitals stable at this time. Will continue to monitor.   0215 chest pain "much better" rating 5/10. Pt resting in bed on cell phone. Will continue to monitor.

## 2016-07-14 NOTE — Progress Notes (Signed)
NT called RN to room. Pt stated he felt "nauseated, miserable, and was sweaty." He also stated that he had experienced low blood sugars in the past. CBG=105 at this time. Nausea medication not due at this time, NP Scorr paged. Will continue to monitor.

## 2016-07-15 ENCOUNTER — Observation Stay (HOSPITAL_COMMUNITY): Payer: Medicare Other

## 2016-07-15 ENCOUNTER — Encounter (HOSPITAL_COMMUNITY): Payer: Self-pay | Admitting: Interventional Cardiology

## 2016-07-15 ENCOUNTER — Other Ambulatory Visit: Payer: Self-pay | Admitting: Cardiology

## 2016-07-15 DIAGNOSIS — F419 Anxiety disorder, unspecified: Secondary | ICD-10-CM | POA: Diagnosis not present

## 2016-07-15 DIAGNOSIS — Z79899 Other long term (current) drug therapy: Secondary | ICD-10-CM | POA: Diagnosis not present

## 2016-07-15 DIAGNOSIS — R079 Chest pain, unspecified: Secondary | ICD-10-CM | POA: Diagnosis not present

## 2016-07-15 DIAGNOSIS — Z89611 Acquired absence of right leg above knee: Secondary | ICD-10-CM | POA: Diagnosis not present

## 2016-07-15 DIAGNOSIS — E669 Obesity, unspecified: Secondary | ICD-10-CM | POA: Diagnosis present

## 2016-07-15 DIAGNOSIS — Z888 Allergy status to other drugs, medicaments and biological substances status: Secondary | ICD-10-CM | POA: Diagnosis not present

## 2016-07-15 DIAGNOSIS — Z880 Allergy status to penicillin: Secondary | ICD-10-CM | POA: Diagnosis not present

## 2016-07-15 DIAGNOSIS — Z9049 Acquired absence of other specified parts of digestive tract: Secondary | ICD-10-CM | POA: Diagnosis not present

## 2016-07-15 DIAGNOSIS — Z91041 Radiographic dye allergy status: Secondary | ICD-10-CM | POA: Diagnosis not present

## 2016-07-15 DIAGNOSIS — Z765 Malingerer [conscious simulation]: Secondary | ICD-10-CM | POA: Diagnosis not present

## 2016-07-15 DIAGNOSIS — Z885 Allergy status to narcotic agent status: Secondary | ICD-10-CM | POA: Diagnosis not present

## 2016-07-15 DIAGNOSIS — I10 Essential (primary) hypertension: Secondary | ICD-10-CM | POA: Diagnosis not present

## 2016-07-15 DIAGNOSIS — I5022 Chronic systolic (congestive) heart failure: Secondary | ICD-10-CM

## 2016-07-15 DIAGNOSIS — Z91048 Other nonmedicinal substance allergy status: Secondary | ICD-10-CM | POA: Diagnosis not present

## 2016-07-15 DIAGNOSIS — R0789 Other chest pain: Secondary | ICD-10-CM | POA: Diagnosis present

## 2016-07-15 DIAGNOSIS — F410 Panic disorder [episodic paroxysmal anxiety] without agoraphobia: Secondary | ICD-10-CM | POA: Diagnosis present

## 2016-07-15 DIAGNOSIS — Z881 Allergy status to other antibiotic agents status: Secondary | ICD-10-CM | POA: Diagnosis not present

## 2016-07-15 DIAGNOSIS — K219 Gastro-esophageal reflux disease without esophagitis: Secondary | ICD-10-CM | POA: Diagnosis present

## 2016-07-15 DIAGNOSIS — Z8249 Family history of ischemic heart disease and other diseases of the circulatory system: Secondary | ICD-10-CM | POA: Diagnosis not present

## 2016-07-15 MED ORDER — HYDROMORPHONE HCL 1 MG/ML IJ SOLN
1.0000 mg | INTRAMUSCULAR | Status: DC | PRN
  Administered 2016-07-15 – 2016-07-16 (×4): 1 mg via INTRAVENOUS
  Filled 2016-07-15 (×4): qty 1

## 2016-07-15 MED ORDER — HYDROMORPHONE HCL 1 MG/ML IJ SOLN
1.0000 mg | INTRAMUSCULAR | Status: DC | PRN
  Administered 2016-07-15: 1 mg via INTRAVENOUS
  Filled 2016-07-15: qty 1

## 2016-07-15 MED ORDER — HYDROMORPHONE HCL 1 MG/ML IJ SOLN
0.5000 mg | INTRAMUSCULAR | Status: DC | PRN
Start: 2016-07-15 — End: 2016-07-15
  Administered 2016-07-15: 0.5 mg via INTRAVENOUS
  Filled 2016-07-15: qty 1

## 2016-07-15 MED ORDER — SERTRALINE HCL 50 MG PO TABS
25.0000 mg | ORAL_TABLET | Freq: Every day | ORAL | Status: DC
  Filled 2016-07-15: qty 1

## 2016-07-15 MED ORDER — IBUPROFEN 600 MG PO TABS
600.0000 mg | ORAL_TABLET | Freq: Four times a day (QID) | ORAL | Status: DC | PRN
  Administered 2016-07-15: 600 mg via ORAL
  Filled 2016-07-15: qty 1

## 2016-07-15 MED ORDER — ALPRAZOLAM 0.25 MG PO TABS: 0.2500 mg | ORAL_TABLET | Freq: Every day | ORAL | Status: DC | PRN

## 2016-07-15 NOTE — Progress Notes (Addendum)
   Pt with normal coronary arteries, EF 50% on cath. Chest pain is non cardiac and we will sign off. Will plan for follow up of LV function with outpatient echo and cardiology follow up in 3 months.  Daune Perch, AGNP-C 07/15/2016  8:53 AM Pager: [phone]

## 2016-07-15 NOTE — Care Management Note (Signed)
Case Management Note  Patient Details  Name: Jason [name] MRN: [id number] Date of Birth: Jun 11, 1965  Subjective/Objective:     Chest pain, HTN              Action/Plan: Discharge Planning: NCM spoke to pt. Has a 49 year old son in the home. Pt states he still drives but without car at this time. No problems with getting meds. Has four arm crutches, wheelchair and RW at home. Pt states he would like to have a electric wheelchair. Explained he would have to go through the PCP.  PCP Ardelle Anton MD   Expected Discharge Date:                  Expected Discharge Plan:  Home/Self Care  In-House Referral:  NA  Discharge planning Services  CM Consult  Post Acute Care Choice:  NA Choice offered to:  NA  DME Arranged:  N/A DME Agency:  NA  HH Arranged:  NA HH Agency:  NA  Status of Service:  In process, will continue to follow  If discussed at Long Length of Stay Meetings, dates discussed:    Additional Comments:  Erenest Rasher, RN 07/15/2016, 10:34 AM

## 2016-07-15 NOTE — Progress Notes (Signed)
PROGRESS NOTE    Jason [name]  FTD:[id number] DOB: 1966-02-12 DOA: 07/13/2016 PCP: Ardelle Anton, MD    Brief Narrative: Jason [name] is a 51 y.o. 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 Initial ECG showed new LP FB, otherwise sinus tachycardia and troponin was negative. Cardiac enzymes negative. CXR negative.  Cardiology consulted for recommendations and he is scheduled for cath this afternoon.   Assessment & Plan:   Principal Problem:   Chest pain Active Problems:   Essential hypertension   S/P AKA (above knee amputation) unilateral, right (HCC)   Chest pain.  Atypical  ACS ruled out. Cardiac cath shows normal coronaries and EF of 50%, rest of the work up for outpatient.  Intermittent chest pain today, pt reports now that morphine,  ativan and ibuprophen is not helping. He is restless reports pain of 8 to 9/10, sharp substernal. Ordered xanax, sertraline and a low dose of dilaudid.      Hypertension: controlled.      DVT prophylaxis: lovenox.  Code Status:  Full code.  Family Communication: none at bedside.  Disposition Plan: home when chest pain improves.    Consultants:  Cardiology.    Procedures: cath scheduled today.    Antimicrobials: none.    Subjective: Intermittent chest pain.   Objective: Vitals:   07/14/16 2348 07/15/16 0532 07/15/16 0550 07/15/16 0754  BP: 139/68 (!) 154/89  (!) 146/77  Pulse: (!) 119  (!) 119 (!) 119  Resp:    20  Temp: 98.8 F (37.1 C)  98.3 F (36.8 C) 98.4 F (36.9 C)  TempSrc: Oral  Oral Oral  SpO2: 94%  94% 95%  Weight:   111.3 kg (245 lb 6.4 oz)   Height:        Intake/Output Summary (Last 24 hours) at 07/15/16 1057 Last data filed at 07/15/16 0555  Gross per 24 hour  Intake            88.25 ml  Output             1450 ml  Net         -1361.75 ml   Filed Weights   07/14/16 0111 07/14/16 0436 07/15/16 0550  Weight:  103.6 kg (228 lb 4.8 oz) 103.6 kg (228 lb 8 oz) 111.3 kg (245 lb 6.4 oz)    Examination:  General exam: Appears anxious and restless.  Respiratory system: Clear to auscultation. Respiratory effort normal. Cardiovascular system: S1 & S2 heard, RRR. No JVD, murmurs, rubs, gallops or clicks. No pedal edema. Gastrointestinal system: Abdomen is nondistended, soft and nontender. No organomegaly or masses felt. Normal bowel sounds heard. Central nervous system: Alert and oriented. No focal neurological deficits. Extremities: Symmetric 5 x 5 power. Skin: No rashes, lesions or ulcers     Data Reviewed: I have personally reviewed following labs and imaging studies  CBC:  Recent Labs Lab 07/13/16 1838  WBC 9.3  HGB 15.7  HCT 47.1  MCV 90.9  PLT 062   Basic Metabolic Panel:  Recent Labs Lab 07/13/16 1838  NA 137  K 3.9  CL 105  CO2 22  GLUCOSE 92  BUN 12  CREATININE 1.10  CALCIUM 9.0   GFR: Estimated Creatinine Clearance: 102.6 mL/min (by C-G formula based on SCr of 1.1 mg/dL). Liver Function Tests: No results for input(s): AST, ALT, ALKPHOS, BILITOT, PROT, ALBUMIN in the last 168 hours. No results for input(s):  LIPASE, AMYLASE in the last 168 hours. No results for input(s): AMMONIA in the last 168 hours. Coagulation Profile:  Recent Labs Lab 07/14/16 1032  INR 0.99   Cardiac Enzymes:  Recent Labs Lab 07/14/16 0324 07/14/16 0630  TROPONINI <0.03 <0.03   BNP (last 3 results) No results for input(s): PROBNP in the last 8760 hours. HbA1C: No results for input(s): HGBA1C in the last 72 hours. CBG:  Recent Labs Lab 07/14/16 0447  GLUCAP 105*   Lipid Profile: No results for input(s): CHOL, HDL, LDLCALC, TRIG, CHOLHDL, LDLDIRECT in the last 72 hours. Thyroid Function Tests: No results for input(s): TSH, T4TOTAL, FREET4, T3FREE, THYROIDAB in the last 72 hours. Anemia Panel: No results for input(s): VITAMINB12, FOLATE, FERRITIN, TIBC, IRON, RETICCTPCT in the  last 72 hours. Sepsis Labs: No results for input(s): PROCALCITON, LATICACIDVEN in the last 168 hours.  Recent Results (from the past 240 hour(s))  MRSA PCR Screening     Status: None   Collection Time: 07/14/16  1:05 AM  Result Value Ref Range Status   MRSA by PCR NEGATIVE NEGATIVE Final    Comment:        The GeneXpert MRSA Assay (FDA approved for NASAL specimens only), is one component of a comprehensive MRSA colonization surveillance program. It is not intended to diagnose MRSA infection nor to guide or monitor treatment for MRSA infections.          Radiology Studies: Dg Chest Port 1 View  Result Date: 07/15/2016 CLINICAL DATA:  Left chest pain tonight. EXAM: PORTABLE CHEST 1 VIEW COMPARISON:  07/13/2016 FINDINGS: A single AP portable view of the chest demonstrates no focal airspace consolidation or alveolar edema. The lungs are grossly clear. There is no large effusion or pneumothorax. Cardiac and mediastinal contours appear unremarkable. IMPRESSION: No active disease. Electronically Signed   By: Andreas Newport M.D.   On: 07/15/2016 06:00   Dg Chest Port 1 View  Result Date: 07/13/2016 CLINICAL DATA:  Chest pain EXAM: PORTABLE CHEST 1 VIEW COMPARISON:  11/28/2011 FINDINGS: Cardiac shadow is within normal limits. The lungs are hypoinflated with crowding of the vascular markings. No focal infiltrate or sizable effusion is seen. No bony abnormality is noted. IMPRESSION: Poor inspiratory effort.  No acute abnormality seen. Electronically Signed   By: Inez Catalina M.D.   On: 07/13/2016 18:46        Scheduled Meds: . enoxaparin (LOVENOX) injection  40 mg Subcutaneous Q24H  . gabapentin  800 mg Oral BID WC   And  . gabapentin  1,600 mg Oral QHS  . sodium chloride flush  3 mL Intravenous Q12H   Continuous Infusions: . sodium chloride       LOS: 0 days    Time spent: 30 minutes.     Hosie Poisson, MD [facility] Pager [phone]  If 7PM-7AM, please  contact night-coverage [email or web address] Password [email or web address] 07/15/2016, 10:57 AM

## 2016-07-15 NOTE — Plan of Care (Signed)
Problem: Health Behavior/Discharge Planning: Goal: Ability to manage health-related needs will improve Outcome: Not Progressing Patient having constant complaints of discomfort throughout evening. RN consulted with Cardiology. Due to no significant results from cardiac cath, EKG ordered but discomfort most likely from anxiety. Patient continuously getting IVP Morphine, PO Ativan, IV Toradol (Benadryl for reaction), plus Elavil and Ambien for sleep. Patient continuously worrying about HR ranging from 100-120's. [facility] covering paged multiple times throughout evening with continued complaints. Patient consulted with a "friend" who is a PA and he stated she recommends "IV Ativan instead of PO". Patient also discussed with RN that he doesn't think he's getting enough Morphine and usually Dilaudid works better for him. Midlevel discussed with RN potential for psychiatric consult.   Problem: Pain Managment: Goal: General experience of comfort will improve Outcome: Not Progressing Pain not relieved throughout shift, patient has constant complaints.

## 2016-07-15 NOTE — Care Management Obs Status (Signed)
[place] NOTIFICATION   Patient Details  Name: Jason [name] MRN: [id number] Date of Birth: January 17, 1966   Medicare Observation Status Notification Given:  Yes    Erenest Rasher, RN 07/15/2016, 10:32 AM

## 2016-07-16 DIAGNOSIS — R079 Chest pain, unspecified: Secondary | ICD-10-CM

## 2016-07-16 LAB — COMPREHENSIVE METABOLIC PANEL
ALBUMIN: 3.4 g/dL — AB (ref 3.5–5.0)
ALT: 42 U/L (ref 17–63)
ANION GAP: 10 (ref 5–15)
AST: 34 U/L (ref 15–41)
Alkaline Phosphatase: 90 U/L (ref 38–126)
BILIRUBIN TOTAL: 0.3 mg/dL (ref 0.3–1.2)
BUN: 14 mg/dL (ref 6–20)
CO2: 24 mmol/L (ref 22–32)
Calcium: 8.7 mg/dL — ABNORMAL LOW (ref 8.9–10.3)
Chloride: 104 mmol/L (ref 101–111)
Creatinine, Ser: 1.06 mg/dL (ref 0.61–1.24)
GFR calc Af Amer: >60 mL/min (ref >60)
GFR calc non Af Amer: >60 mL/min (ref >60)
GLUCOSE: 123 mg/dL — AB (ref 65–99)
POTASSIUM: 3.9 mmol/L (ref 3.5–5.1)
Sodium: 138 mmol/L (ref 135–145)
TOTAL PROTEIN: 6.3 g/dL — AB (ref 6.5–8.1)

## 2016-07-16 LAB — CBC
HCT: 43.4 % (ref 39.0–52.0)
Hemoglobin: 13.9 g/dL (ref 13.0–17.0)
MCH: 29.6 pg (ref 26.0–34.0)
MCHC: 32 g/dL (ref 30.0–36.0)
MCV: 92.5 fL (ref 78.0–100.0)
Platelets: 227 10*3/uL (ref 150–400)
RBC: 4.69 MIL/uL (ref 4.22–5.81)
RDW: 14.7 % (ref 11.5–15.5)
WBC: 9.5 10*3/uL (ref 4.0–10.5)

## 2016-07-16 LAB — LIPASE, BLOOD: LIPASE: 17 U/L (ref 11–51)

## 2016-07-16 MED ORDER — PANTOPRAZOLE SODIUM 40 MG PO TBEC
40.0000 mg | DELAYED_RELEASE_TABLET | Freq: Every day | ORAL | Status: DC
  Administered 2016-07-16: 40 mg via ORAL
  Filled 2016-07-16: qty 1

## 2016-07-16 MED ORDER — OXYCODONE HCL 5 MG PO TABS: 5.0000 mg | ORAL_TABLET | Freq: Four times a day (QID) | ORAL | 0 refills | Status: DC | PRN

## 2016-07-16 MED ORDER — PANTOPRAZOLE SODIUM 40 MG PO TBEC: 40.0000 mg | DELAYED_RELEASE_TABLET | Freq: Every day | ORAL | 0 refills | Status: DC

## 2016-07-16 MED ORDER — GI COCKTAIL ~~LOC~~
30.0000 mL | Freq: Once | ORAL | Status: AC
  Administered 2016-07-16: 30 mL via ORAL
  Filled 2016-07-16: qty 30

## 2016-07-16 NOTE — Discharge Summary (Signed)
[facility]  Physician Discharge Summary   Patient ID: Jason [name] MRN: [id number] DOB/AGE: Oct 26, 1965 51 y.o.  Admit date: 07/13/2016 Discharge date: 07/16/2016  PCP: Ardelle Anton, MD  DISCHARGE DIAGNOSES:  Principal Problem:   Chest pain Active Problems:   Essential hypertension   S/P AKA (above knee amputation) unilateral, right (HCC)   RECOMMENDATIONS FOR OUTPATIENT FOLLOW UP: 1. Patient to follow-up with his primary care provider within one week   DISCHARGE CONDITION: fair  Diet recommendation: As before  [facility] Weights   07/14/16 0111 07/14/16 0436 07/15/16 0550  Weight: 103.6 kg (228 lb 4.8 oz) 103.6 kg (228 lb 8 oz) 111.3 kg (245 lb 6.4 oz)    INITIAL HISTORY: 50 y.o.malewith a past medical history significant for HTN and right AKA due to prosthesis infectionwho presents with chest pain.  Consultations:  Cardiology  Procedures: Cardiac catheterization Conclusion    Normal coronary arteries.  Low normal LV function. EF estimated to be 50%.  Continuous chest discomfort during the procedure.     HOSPITAL COURSE:   Chest pain Presentation was atypical. EKG with nonspecific changes. Troponins are normal. Seen by cardiology and patient underwent cardiac catheterization which showed clean coronaries. Repeat EKG showed stable findings. D-dimer was normal. Patient noted to be without any discomfort or distress but complaining of significant pain. Previous records reviewed. There is some previous history of drug-seeking behavior. Patient also mentions acid reflux. He will be given a prescription for PPI. He was asked to follow-up with his primary care provider for further management.  Other medical conditions have been stable. He may continue his home medication regimen.  Stable for discharge.  PERTINENT LABS:  The results of significant diagnostics from this hospitalization (including imaging, microbiology, ancillary and laboratory) are  listed below for reference.    Microbiology: Recent Results (from the past 240 hour(s))  MRSA PCR Screening     Status: None   Collection Time: 07/14/16  1:05 AM  Result Value Ref Range Status   MRSA by PCR NEGATIVE NEGATIVE Final    Comment:        The GeneXpert MRSA Assay (FDA approved for NASAL specimens only), is one component of a comprehensive MRSA colonization surveillance program. It is not intended to diagnose MRSA infection nor to guide or monitor treatment for MRSA infections.      Labs: Basic Metabolic Panel:  Recent Labs Lab 07/13/16 1838 07/16/16 1055  NA 137 138  K 3.9 3.9  CL 105 104  CO2 22 24  GLUCOSE 92 123*  BUN 12 14  CREATININE 1.10 1.06  CALCIUM 9.0 8.7*   Liver Function Tests:  Recent Labs Lab 07/16/16 1055  AST 34  ALT 42  ALKPHOS 90  BILITOT 0.3  PROT 6.3*  ALBUMIN 3.4*    Recent Labs Lab 07/16/16 1055  LIPASE 17   CBC:  Recent Labs Lab 07/13/16 1838 07/16/16 1055  WBC 9.3 9.5  HGB 15.7 13.9  HCT 47.1 43.4  MCV 90.9 92.5  PLT 301 227   Cardiac Enzymes:  Recent Labs Lab 07/14/16 0324 07/14/16 0630  TROPONINI <0.03 <0.03   BNP: BNP (last 3 results)  Recent Labs  07/13/16 1838  BNP 8.3    CBG:  Recent Labs Lab 07/14/16 0447  GLUCAP 105*     IMAGING STUDIES Dg Chest Port 1 View  Result Date: 07/15/2016 CLINICAL DATA:  Left chest pain tonight. EXAM: PORTABLE CHEST 1 VIEW COMPARISON:  07/13/2016 FINDINGS: A single AP portable view of the  chest demonstrates no focal airspace consolidation or alveolar edema. The lungs are grossly clear. There is no large effusion or pneumothorax. Cardiac and mediastinal contours appear unremarkable. IMPRESSION: No active disease. Electronically Signed   By: Andreas Newport M.D.   On: 07/15/2016 06:00   Dg Chest Port 1 View  Result Date: 07/13/2016 CLINICAL DATA:  Chest pain EXAM: PORTABLE CHEST 1 VIEW COMPARISON:  11/28/2011 FINDINGS: Cardiac shadow is within normal  limits. The lungs are hypoinflated with crowding of the vascular markings. No focal infiltrate or sizable effusion is seen. No bony abnormality is noted. IMPRESSION: Poor inspiratory effort.  No acute abnormality seen. Electronically Signed   By: Inez Catalina M.D.   On: 07/13/2016 18:46    DISCHARGE EXAMINATION: Vitals:   07/15/16 1127 07/15/16 2037 07/16/16 0018 07/16/16 0400  BP: (!) 158/81 (!) 159/90 (!) 149/92 (!) 133/91  Pulse: (!) 115 95 100 96  Resp: 20 19 18 18   Temp: 98.2 F (36.8 C) 97.9 F (36.6 C) 98.7 F (37.1 C) 98.4 F (36.9 C)  TempSrc: Oral  Oral Oral  SpO2: 96% 97% 96% 99%  Weight:      Height:       General appearance: alert, cooperative, appears stated age and no distress Resp: clear to auscultation bilaterally Cardio: regular rate and rhythm, S1, S2 normal, no murmur, click, rub or gallop GI: soft, non-tender; bowel sounds normal; no masses,  no organomegaly   DISPOSITION: Home  Discharge Instructions    Call MD for:  difficulty breathing, headache or visual disturbances    Complete by:  As directed    Call MD for:  extreme fatigue    Complete by:  As directed    Call MD for:  persistant dizziness or light-headedness    Complete by:  As directed    Call MD for:  persistant nausea and vomiting    Complete by:  As directed    Call MD for:  temperature >100.4    Complete by:  As directed    Discharge instructions    Complete by:  As directed    Please be sure to follow up with your primary care provider for further management of your symptoms. Take medications as prescribed.  You were cared for by a hospitalist during your hospital stay. If you have any questions about your discharge medications or the care you received while you were in the hospital after you are discharged, you can call the unit and asked to speak with the hospitalist on call if the hospitalist that took care of you is not available. Once you are discharged, your primary care physician  will handle any further medical issues. Please note that NO REFILLS for any discharge medications will be authorized once you are discharged, as it is imperative that you return to your primary care physician (or establish a relationship with a primary care physician if you do not have one) for your aftercare needs so that they can reassess your need for medications and monitor your lab values. If you do not have a primary care physician, you can call [phone] for a physician referral.   Increase activity slowly    Complete by:  As directed       ALLERGIES:  Allergies  Allergen Reactions  . Iodine Rash    Able to eat shellfish. Reports localized reaction at IV site. Able to tolerate with Benadryl.   Clementeen Hoof [Iodinated Diagnostic Agents] Anaphylaxis  . Metrizamide Anaphylaxis  . Other  Other (See Comments)    Under no circumstances will the patient agree to a PICC line  . Reglan [Metoclopramide] Anaphylaxis  . Toradol [Ketorolac Tromethamine] Itching    Patient having systemic itching after receiving IV dose  . Lidocaine Other (See Comments) and Rash    Pt not sure if this is an actual allergy - might have been a one time incident  . Morphine Swelling and Rash    Local reaction to IV being pushed too fast  . Vancomycin Rash    Has had vancomycin since this reaction and had no reaction at all  . Codeine Hives  . Propoxyphene Hives  . Amoxicillin Rash    Patient denies true reaction  . Penicillins Rash    Has patient had a PCN reaction causing immediate rash, facial/tongue/throat swelling, SOB or lightheadedness with hypotension: Yes Has patient had a PCN reaction causing severe rash involving mucus membranes or skin necrosis: No Has patient had a PCN reaction that required hospitalization No Has patient had a PCN reaction occurring within the last 10 years: No If all of the above answers are "NO", then may proceed with Cephalosporin use.   . Tape Rash     Discharge Medication  List as of 07/16/2016 12:41 PM    START taking these medications   Details  oxyCODONE (OXY IR/ROXICODONE) 5 MG immediate release tablet Take 1 tablet (5 mg total) by mouth every 6 (six) hours as needed for severe pain., Starting Wed 07/16/2016, Print    pantoprazole (PROTONIX) 40 MG tablet Take 1 tablet (40 mg total) by mouth daily at 12 noon., Starting Wed 07/16/2016, Print      CONTINUE these medications which have NOT CHANGED   Details  ALPRAZolam (XANAX) 1 MG tablet Take 1 mg by mouth 3 (three) times daily as needed (anxiety or agitation). , Historical Med    amitriptyline (ELAVIL) 50 MG tablet Take 50 mg by mouth at bedtime as needed for sleep., Starting Fri 06/20/2016, Historical Med    docusate sodium (COLACE) 100 MG capsule Take 100 mg by mouth 2 (two) times daily as needed for constipation., Starting Sat 12/16/2014, Historical Med    EPINEPHrine 0.3 mg/0.3 mL IJ SOAJ injection Inject 0.3 mg into the muscle once as needed for anaphylaxis., Starting Wed 05/28/2016, Historical Med    gabapentin (NEURONTIN) 800 MG tablet Take 800-1,600 mg by mouth See admin instructions. 800 mg in the morning then 800 mg at noon(time) and 1,600 mg at bedtime, Starting Wed 05/28/2016, Historical Med    Multiple Vitamin (MULTIVITAMIN WITH MINERALS) TABS Take 2 tablets by mouth daily. , Historical Med    ondansetron (ZOFRAN-ODT) 4 MG disintegrating tablet Take 4 mg by mouth every 8 (eight) hours as needed for nausea. , Starting Sun 01/06/2016, Historical Med    zolpidem (AMBIEN CR) 12.5 MG CR tablet Take 12.5 mg by mouth at bedtime as needed for sleep. , Starting Mon 07/07/2016, Until Tue 07/07/2017, Historical Med      STOP taking these medications     HYDROcodone-acetaminophen (NORCO) 5-325 MG per tablet          Follow-up Information    Sanda Klein, MD Follow up on 10/31/2016.   Specialty:  Cardiology Why:  at 10:00 for cardiology follow up.  Contact information: [address] [place] [address] [phone]        [place] Office Follow up on 10/21/2016.   Specialty:  Cardiology Why:  Echocardiogram at 10:30.  Please arrive at 10:00.  Contact information: [address], [place], [place]. Schedule an appointment as soon as possible for a visit in 1 week(s).   Specialty:  Family Medicine Contact information: [address] [place] [address] [phone]           TOTAL DISCHARGE TIME: 47 minutes  [facility] Pager [phone]  07/16/2016, 1:47 PM

## 2016-07-16 NOTE — Discharge Instructions (Signed)

## 2016-08-07 DIAGNOSIS — F419 Anxiety disorder, unspecified: Secondary | ICD-10-CM | POA: Diagnosis not present

## 2016-08-07 DIAGNOSIS — R262 Difficulty in walking, not elsewhere classified: Secondary | ICD-10-CM | POA: Diagnosis not present

## 2016-08-07 DIAGNOSIS — I10 Essential (primary) hypertension: Secondary | ICD-10-CM | POA: Diagnosis not present

## 2016-08-07 DIAGNOSIS — F4310 Post-traumatic stress disorder, unspecified: Secondary | ICD-10-CM | POA: Diagnosis not present

## 2016-08-07 DIAGNOSIS — F5101 Primary insomnia: Secondary | ICD-10-CM | POA: Diagnosis not present

## 2016-08-07 DIAGNOSIS — Z89611 Acquired absence of right leg above knee: Secondary | ICD-10-CM | POA: Diagnosis not present

## 2016-08-08 DIAGNOSIS — R079 Chest pain, unspecified: Secondary | ICD-10-CM | POA: Diagnosis not present

## 2016-08-09 DIAGNOSIS — R110 Nausea: Secondary | ICD-10-CM | POA: Diagnosis not present

## 2016-08-09 DIAGNOSIS — R072 Precordial pain: Secondary | ICD-10-CM | POA: Diagnosis present

## 2016-08-09 DIAGNOSIS — I358 Other nonrheumatic aortic valve disorders: Secondary | ICD-10-CM | POA: Diagnosis not present

## 2016-08-09 DIAGNOSIS — R918 Other nonspecific abnormal finding of lung field: Secondary | ICD-10-CM | POA: Diagnosis not present

## 2016-08-09 DIAGNOSIS — E669 Obesity, unspecified: Secondary | ICD-10-CM | POA: Diagnosis present

## 2016-08-09 DIAGNOSIS — E119 Type 2 diabetes mellitus without complications: Secondary | ICD-10-CM | POA: Diagnosis not present

## 2016-08-09 DIAGNOSIS — Z89611 Acquired absence of right leg above knee: Secondary | ICD-10-CM | POA: Diagnosis not present

## 2016-08-09 DIAGNOSIS — R0789 Other chest pain: Secondary | ICD-10-CM | POA: Diagnosis not present

## 2016-08-09 DIAGNOSIS — I10 Essential (primary) hypertension: Secondary | ICD-10-CM | POA: Diagnosis present

## 2016-08-09 DIAGNOSIS — Z6831 Body mass index (BMI) 31.0-31.9, adult: Secondary | ICD-10-CM | POA: Diagnosis not present

## 2016-08-09 DIAGNOSIS — R4182 Altered mental status, unspecified: Secondary | ICD-10-CM | POA: Diagnosis not present

## 2016-08-09 DIAGNOSIS — I319 Disease of pericardium, unspecified: Secondary | ICD-10-CM | POA: Diagnosis present

## 2016-08-09 DIAGNOSIS — K760 Fatty (change of) liver, not elsewhere classified: Secondary | ICD-10-CM | POA: Diagnosis not present

## 2016-08-09 DIAGNOSIS — R079 Chest pain, unspecified: Secondary | ICD-10-CM | POA: Diagnosis not present

## 2016-08-09 DIAGNOSIS — I517 Cardiomegaly: Secondary | ICD-10-CM | POA: Diagnosis not present

## 2016-08-09 DIAGNOSIS — K219 Gastro-esophageal reflux disease without esophagitis: Secondary | ICD-10-CM | POA: Diagnosis present

## 2016-08-09 DIAGNOSIS — R0602 Shortness of breath: Secondary | ICD-10-CM | POA: Diagnosis not present

## 2016-08-09 DIAGNOSIS — F419 Anxiety disorder, unspecified: Secondary | ICD-10-CM | POA: Diagnosis present

## 2016-08-09 DIAGNOSIS — M549 Dorsalgia, unspecified: Secondary | ICD-10-CM | POA: Diagnosis not present

## 2016-08-09 DIAGNOSIS — R0989 Other specified symptoms and signs involving the circulatory and respiratory systems: Secondary | ICD-10-CM | POA: Diagnosis not present

## 2016-08-09 DIAGNOSIS — Z79899 Other long term (current) drug therapy: Secondary | ICD-10-CM | POA: Diagnosis not present

## 2016-08-09 DIAGNOSIS — R002 Palpitations: Secondary | ICD-10-CM | POA: Diagnosis not present

## 2016-08-21 DIAGNOSIS — R918 Other nonspecific abnormal finding of lung field: Secondary | ICD-10-CM | POA: Diagnosis not present

## 2016-08-21 DIAGNOSIS — R509 Fever, unspecified: Secondary | ICD-10-CM | POA: Diagnosis not present

## 2016-09-10 DIAGNOSIS — I10 Essential (primary) hypertension: Secondary | ICD-10-CM | POA: Diagnosis not present

## 2016-09-10 DIAGNOSIS — G546 Phantom limb syndrome with pain: Secondary | ICD-10-CM | POA: Diagnosis not present

## 2016-09-10 DIAGNOSIS — G8929 Other chronic pain: Secondary | ICD-10-CM | POA: Diagnosis not present

## 2016-09-10 DIAGNOSIS — M79604 Pain in right leg: Secondary | ICD-10-CM | POA: Diagnosis not present

## 2016-09-10 DIAGNOSIS — M79606 Pain in leg, unspecified: Secondary | ICD-10-CM | POA: Diagnosis not present

## 2016-09-10 DIAGNOSIS — Z89611 Acquired absence of right leg above knee: Secondary | ICD-10-CM | POA: Diagnosis not present

## 2016-09-22 DIAGNOSIS — L03311 Cellulitis of abdominal wall: Secondary | ICD-10-CM | POA: Diagnosis not present

## 2016-09-22 DIAGNOSIS — M5430 Sciatica, unspecified side: Secondary | ICD-10-CM | POA: Diagnosis not present

## 2016-09-22 DIAGNOSIS — Z89611 Acquired absence of right leg above knee: Secondary | ICD-10-CM | POA: Diagnosis not present

## 2016-09-22 DIAGNOSIS — M47896 Other spondylosis, lumbar region: Secondary | ICD-10-CM | POA: Diagnosis not present

## 2016-09-22 DIAGNOSIS — G2581 Restless legs syndrome: Secondary | ICD-10-CM | POA: Diagnosis not present

## 2016-09-22 DIAGNOSIS — L02415 Cutaneous abscess of right lower limb: Secondary | ICD-10-CM | POA: Diagnosis not present

## 2016-09-22 DIAGNOSIS — F6810 Factitious disorder, unspecified: Secondary | ICD-10-CM | POA: Diagnosis not present

## 2016-09-22 DIAGNOSIS — L02211 Cutaneous abscess of abdominal wall: Secondary | ICD-10-CM | POA: Diagnosis not present

## 2016-09-22 DIAGNOSIS — K760 Fatty (change of) liver, not elsewhere classified: Secondary | ICD-10-CM | POA: Diagnosis not present

## 2016-09-22 DIAGNOSIS — M5441 Lumbago with sciatica, right side: Secondary | ICD-10-CM | POA: Diagnosis not present

## 2016-09-22 DIAGNOSIS — K5730 Diverticulosis of large intestine without perforation or abscess without bleeding: Secondary | ICD-10-CM | POA: Diagnosis not present

## 2016-09-22 DIAGNOSIS — R509 Fever, unspecified: Secondary | ICD-10-CM | POA: Diagnosis not present

## 2016-09-22 DIAGNOSIS — K439 Ventral hernia without obstruction or gangrene: Secondary | ICD-10-CM | POA: Diagnosis not present

## 2016-09-22 DIAGNOSIS — G546 Phantom limb syndrome with pain: Secondary | ICD-10-CM | POA: Diagnosis not present

## 2016-09-23 DIAGNOSIS — I10 Essential (primary) hypertension: Secondary | ICD-10-CM | POA: Diagnosis present

## 2016-09-23 DIAGNOSIS — E559 Vitamin D deficiency, unspecified: Secondary | ICD-10-CM | POA: Diagnosis present

## 2016-09-23 DIAGNOSIS — R918 Other nonspecific abnormal finding of lung field: Secondary | ICD-10-CM | POA: Diagnosis not present

## 2016-09-23 DIAGNOSIS — B999 Unspecified infectious disease: Secondary | ICD-10-CM | POA: Diagnosis not present

## 2016-09-23 DIAGNOSIS — G546 Phantom limb syndrome with pain: Secondary | ICD-10-CM | POA: Diagnosis present

## 2016-09-23 DIAGNOSIS — G8929 Other chronic pain: Secondary | ICD-10-CM | POA: Diagnosis not present

## 2016-09-23 DIAGNOSIS — T8789 Other complications of amputation stump: Secondary | ICD-10-CM | POA: Diagnosis not present

## 2016-09-23 DIAGNOSIS — Z9049 Acquired absence of other specified parts of digestive tract: Secondary | ICD-10-CM | POA: Diagnosis not present

## 2016-09-23 DIAGNOSIS — L02415 Cutaneous abscess of right lower limb: Secondary | ICD-10-CM | POA: Diagnosis not present

## 2016-09-23 DIAGNOSIS — Z881 Allergy status to other antibiotic agents status: Secondary | ICD-10-CM | POA: Diagnosis not present

## 2016-09-23 DIAGNOSIS — R188 Other ascites: Secondary | ICD-10-CM | POA: Diagnosis not present

## 2016-09-23 DIAGNOSIS — M79609 Pain in unspecified limb: Secondary | ICD-10-CM | POA: Diagnosis not present

## 2016-09-23 DIAGNOSIS — M79604 Pain in right leg: Secondary | ICD-10-CM | POA: Diagnosis not present

## 2016-09-23 DIAGNOSIS — Z89611 Acquired absence of right leg above knee: Secondary | ICD-10-CM | POA: Diagnosis not present

## 2016-09-23 DIAGNOSIS — R600 Localized edema: Secondary | ICD-10-CM | POA: Diagnosis not present

## 2016-09-23 DIAGNOSIS — T879 Unspecified complications of amputation stump: Secondary | ICD-10-CM | POA: Diagnosis not present

## 2016-09-23 DIAGNOSIS — K760 Fatty (change of) liver, not elsewhere classified: Secondary | ICD-10-CM | POA: Diagnosis not present

## 2016-09-23 DIAGNOSIS — M25561 Pain in right knee: Secondary | ICD-10-CM | POA: Diagnosis not present

## 2016-09-23 DIAGNOSIS — F419 Anxiety disorder, unspecified: Secondary | ICD-10-CM | POA: Diagnosis not present

## 2016-09-23 DIAGNOSIS — R5081 Fever presenting with conditions classified elsewhere: Secondary | ICD-10-CM | POA: Diagnosis not present

## 2016-09-23 DIAGNOSIS — F6810 Factitious disorder, unspecified: Secondary | ICD-10-CM | POA: Diagnosis present

## 2016-09-23 DIAGNOSIS — M25461 Effusion, right knee: Secondary | ICD-10-CM | POA: Diagnosis not present

## 2016-09-23 DIAGNOSIS — M5441 Lumbago with sciatica, right side: Secondary | ICD-10-CM | POA: Diagnosis not present

## 2016-09-23 DIAGNOSIS — F5101 Primary insomnia: Secondary | ICD-10-CM | POA: Diagnosis not present

## 2016-09-23 DIAGNOSIS — L02212 Cutaneous abscess of back [any part, except buttock]: Secondary | ICD-10-CM | POA: Diagnosis not present

## 2016-09-23 DIAGNOSIS — L02211 Cutaneous abscess of abdominal wall: Secondary | ICD-10-CM | POA: Diagnosis present

## 2016-09-23 DIAGNOSIS — G4453 Primary thunderclap headache: Secondary | ICD-10-CM | POA: Diagnosis not present

## 2016-09-23 DIAGNOSIS — K5730 Diverticulosis of large intestine without perforation or abscess without bleeding: Secondary | ICD-10-CM | POA: Diagnosis not present

## 2016-09-23 DIAGNOSIS — B954 Other streptococcus as the cause of diseases classified elsewhere: Secondary | ICD-10-CM | POA: Diagnosis present

## 2016-09-23 DIAGNOSIS — G2581 Restless legs syndrome: Secondary | ICD-10-CM | POA: Diagnosis present

## 2016-09-23 DIAGNOSIS — M25551 Pain in right hip: Secondary | ICD-10-CM | POA: Diagnosis not present

## 2016-09-23 DIAGNOSIS — L03311 Cellulitis of abdominal wall: Secondary | ICD-10-CM | POA: Diagnosis present

## 2016-09-23 DIAGNOSIS — K589 Irritable bowel syndrome without diarrhea: Secondary | ICD-10-CM | POA: Diagnosis present

## 2016-09-23 DIAGNOSIS — R509 Fever, unspecified: Secondary | ICD-10-CM | POA: Diagnosis present

## 2016-09-23 DIAGNOSIS — T814XXA Infection following a procedure, initial encounter: Secondary | ICD-10-CM | POA: Diagnosis not present

## 2016-09-23 DIAGNOSIS — K439 Ventral hernia without obstruction or gangrene: Secondary | ICD-10-CM | POA: Diagnosis not present

## 2016-10-21 ENCOUNTER — Other Ambulatory Visit (HOSPITAL_COMMUNITY): Payer: Medicare Other

## 2016-10-31 ENCOUNTER — Ambulatory Visit: Payer: Medicare Other | Admitting: Cardiovascular Disease

## 2016-11-10 ENCOUNTER — Encounter (HOSPITAL_COMMUNITY): Payer: Self-pay | Admitting: Radiology

## 2016-11-11 ENCOUNTER — Telehealth (HOSPITAL_COMMUNITY): Payer: Self-pay | Admitting: Cardiology

## 2016-11-11 DIAGNOSIS — G8929 Other chronic pain: Secondary | ICD-10-CM | POA: Diagnosis not present

## 2016-11-11 DIAGNOSIS — I10 Essential (primary) hypertension: Secondary | ICD-10-CM | POA: Diagnosis not present

## 2016-11-11 DIAGNOSIS — F4310 Post-traumatic stress disorder, unspecified: Secondary | ICD-10-CM | POA: Diagnosis not present

## 2016-11-11 DIAGNOSIS — Z89611 Acquired absence of right leg above knee: Secondary | ICD-10-CM | POA: Diagnosis not present

## 2016-11-11 DIAGNOSIS — M79604 Pain in right leg: Secondary | ICD-10-CM | POA: Diagnosis not present

## 2016-11-11 DIAGNOSIS — F5101 Primary insomnia: Secondary | ICD-10-CM | POA: Diagnosis not present

## 2016-11-11 NOTE — Telephone Encounter (Signed)
NOS 10/21/16 11/05/16 unable to leave message. Mailbox is full. EVD  11/06/16 called pt and VM was full..RG  11/10/16 unable to leave mess. VM starts then fast rings. Will mail letter EVD. sent inbasket to [facility]  11/11/16 Called pt and spoke with him and he voiced that he did not want to have the test done , he meant to call and let us know.Marland KitchenMarland KitchenRG

## 2016-12-01 DIAGNOSIS — J9811 Atelectasis: Secondary | ICD-10-CM | POA: Diagnosis not present

## 2016-12-01 DIAGNOSIS — M479 Spondylosis, unspecified: Secondary | ICD-10-CM | POA: Diagnosis not present

## 2016-12-01 DIAGNOSIS — N179 Acute kidney failure, unspecified: Secondary | ICD-10-CM | POA: Diagnosis not present

## 2016-12-01 DIAGNOSIS — I10 Essential (primary) hypertension: Secondary | ICD-10-CM | POA: Diagnosis present

## 2016-12-01 DIAGNOSIS — Z136 Encounter for screening for cardiovascular disorders: Secondary | ICD-10-CM | POA: Diagnosis not present

## 2016-12-01 DIAGNOSIS — J9601 Acute respiratory failure with hypoxia: Secondary | ICD-10-CM | POA: Diagnosis not present

## 2016-12-01 DIAGNOSIS — I517 Cardiomegaly: Secondary | ICD-10-CM | POA: Diagnosis not present

## 2016-12-01 DIAGNOSIS — R1084 Generalized abdominal pain: Secondary | ICD-10-CM | POA: Diagnosis not present

## 2016-12-01 DIAGNOSIS — Q670 Congenital facial asymmetry: Secondary | ICD-10-CM | POA: Diagnosis not present

## 2016-12-01 DIAGNOSIS — R9431 Abnormal electrocardiogram [ECG] [EKG]: Secondary | ICD-10-CM | POA: Diagnosis not present

## 2016-12-01 DIAGNOSIS — R2990 Unspecified symptoms and signs involving the nervous system: Secondary | ICD-10-CM | POA: Diagnosis not present

## 2016-12-01 DIAGNOSIS — E785 Hyperlipidemia, unspecified: Secondary | ICD-10-CM | POA: Diagnosis present

## 2016-12-01 DIAGNOSIS — G4700 Insomnia, unspecified: Secondary | ICD-10-CM | POA: Diagnosis not present

## 2016-12-01 DIAGNOSIS — H538 Other visual disturbances: Secondary | ICD-10-CM | POA: Diagnosis not present

## 2016-12-01 DIAGNOSIS — I2510 Atherosclerotic heart disease of native coronary artery without angina pectoris: Secondary | ICD-10-CM | POA: Diagnosis present

## 2016-12-01 DIAGNOSIS — Z9911 Dependence on respirator [ventilator] status: Secondary | ICD-10-CM | POA: Diagnosis not present

## 2016-12-01 DIAGNOSIS — M8580 Other specified disorders of bone density and structure, unspecified site: Secondary | ICD-10-CM | POA: Diagnosis not present

## 2016-12-01 DIAGNOSIS — R55 Syncope and collapse: Secondary | ICD-10-CM | POA: Diagnosis not present

## 2016-12-01 DIAGNOSIS — R269 Unspecified abnormalities of gait and mobility: Secondary | ICD-10-CM | POA: Diagnosis not present

## 2016-12-01 DIAGNOSIS — Z955 Presence of coronary angioplasty implant and graft: Secondary | ICD-10-CM | POA: Diagnosis not present

## 2016-12-01 DIAGNOSIS — M6289 Other specified disorders of muscle: Secondary | ICD-10-CM | POA: Diagnosis not present

## 2016-12-01 DIAGNOSIS — R0989 Other specified symptoms and signs involving the circulatory and respiratory systems: Secondary | ICD-10-CM | POA: Diagnosis not present

## 2016-12-01 DIAGNOSIS — I69923 Fluency disorder following unspecified cerebrovascular disease: Secondary | ICD-10-CM | POA: Diagnosis not present

## 2016-12-01 DIAGNOSIS — R531 Weakness: Secondary | ICD-10-CM | POA: Diagnosis not present

## 2016-12-01 DIAGNOSIS — T888XXA Other specified complications of surgical and medical care, not elsewhere classified, initial encounter: Secondary | ICD-10-CM | POA: Diagnosis not present

## 2016-12-01 DIAGNOSIS — Z9049 Acquired absence of other specified parts of digestive tract: Secondary | ICD-10-CM | POA: Diagnosis not present

## 2016-12-01 DIAGNOSIS — Z430 Encounter for attention to tracheostomy: Secondary | ICD-10-CM | POA: Diagnosis not present

## 2016-12-01 DIAGNOSIS — R0602 Shortness of breath: Secondary | ICD-10-CM | POA: Diagnosis not present

## 2016-12-01 DIAGNOSIS — R4789 Other speech disturbances: Secondary | ICD-10-CM | POA: Diagnosis not present

## 2016-12-01 DIAGNOSIS — I69954 Hemiplegia and hemiparesis following unspecified cerebrovascular disease affecting left non-dominant side: Secondary | ICD-10-CM | POA: Diagnosis not present

## 2016-12-01 DIAGNOSIS — N189 Chronic kidney disease, unspecified: Secondary | ICD-10-CM | POA: Diagnosis not present

## 2016-12-01 DIAGNOSIS — R931 Abnormal findings on diagnostic imaging of heart and coronary circulation: Secondary | ICD-10-CM | POA: Diagnosis not present

## 2016-12-01 DIAGNOSIS — G546 Phantom limb syndrome with pain: Secondary | ICD-10-CM | POA: Diagnosis not present

## 2016-12-01 DIAGNOSIS — R29707 NIHSS score 7: Secondary | ICD-10-CM | POA: Diagnosis present

## 2016-12-01 DIAGNOSIS — Z89611 Acquired absence of right leg above knee: Secondary | ICD-10-CM | POA: Diagnosis not present

## 2016-12-01 DIAGNOSIS — R918 Other nonspecific abnormal finding of lung field: Secondary | ICD-10-CM | POA: Diagnosis not present

## 2016-12-01 DIAGNOSIS — M1611 Unilateral primary osteoarthritis, right hip: Secondary | ICD-10-CM | POA: Diagnosis not present

## 2016-12-01 DIAGNOSIS — R404 Transient alteration of awareness: Secondary | ICD-10-CM | POA: Diagnosis not present

## 2016-12-01 DIAGNOSIS — F419 Anxiety disorder, unspecified: Secondary | ICD-10-CM | POA: Diagnosis present

## 2016-12-01 DIAGNOSIS — T45615A Adverse effect of thrombolytic drugs, initial encounter: Secondary | ICD-10-CM | POA: Diagnosis not present

## 2016-12-01 DIAGNOSIS — R200 Anesthesia of skin: Secondary | ICD-10-CM | POA: Diagnosis not present

## 2016-12-01 DIAGNOSIS — I639 Cerebral infarction, unspecified: Secondary | ICD-10-CM | POA: Diagnosis present

## 2016-12-01 DIAGNOSIS — R51 Headache: Secondary | ICD-10-CM | POA: Diagnosis not present

## 2016-12-01 DIAGNOSIS — F5101 Primary insomnia: Secondary | ICD-10-CM | POA: Diagnosis not present

## 2016-12-01 DIAGNOSIS — I7789 Other specified disorders of arteries and arterioles: Secondary | ICD-10-CM | POA: Diagnosis not present

## 2016-12-01 DIAGNOSIS — Z9282 Status post administration of tPA (rtPA) in a different facility within the last 24 hours prior to admission to current facility: Secondary | ICD-10-CM | POA: Diagnosis not present

## 2016-12-01 DIAGNOSIS — Z91041 Radiographic dye allergy status: Secondary | ICD-10-CM | POA: Diagnosis not present

## 2016-12-17 DIAGNOSIS — M79604 Pain in right leg: Secondary | ICD-10-CM | POA: Diagnosis not present

## 2016-12-17 DIAGNOSIS — I10 Essential (primary) hypertension: Secondary | ICD-10-CM | POA: Diagnosis not present

## 2016-12-17 DIAGNOSIS — R262 Difficulty in walking, not elsewhere classified: Secondary | ICD-10-CM | POA: Diagnosis not present

## 2016-12-17 DIAGNOSIS — F419 Anxiety disorder, unspecified: Secondary | ICD-10-CM | POA: Diagnosis not present

## 2016-12-17 DIAGNOSIS — Z89611 Acquired absence of right leg above knee: Secondary | ICD-10-CM | POA: Diagnosis not present

## 2016-12-17 DIAGNOSIS — G8929 Other chronic pain: Secondary | ICD-10-CM | POA: Diagnosis not present

## 2016-12-29 DIAGNOSIS — D225 Melanocytic nevi of trunk: Secondary | ICD-10-CM | POA: Diagnosis not present

## 2016-12-29 DIAGNOSIS — D485 Neoplasm of uncertain behavior of skin: Secondary | ICD-10-CM | POA: Diagnosis not present

## 2016-12-29 DIAGNOSIS — D1801 Hemangioma of skin and subcutaneous tissue: Secondary | ICD-10-CM | POA: Diagnosis not present

## 2016-12-31 DIAGNOSIS — D225 Melanocytic nevi of trunk: Secondary | ICD-10-CM | POA: Diagnosis not present

## 2017-01-23 DIAGNOSIS — S6992XA Unspecified injury of left wrist, hand and finger(s), initial encounter: Secondary | ICD-10-CM | POA: Diagnosis not present

## 2017-01-23 DIAGNOSIS — M25532 Pain in left wrist: Secondary | ICD-10-CM | POA: Diagnosis not present

## 2017-01-23 DIAGNOSIS — M19041 Primary osteoarthritis, right hand: Secondary | ICD-10-CM | POA: Diagnosis not present

## 2017-01-23 DIAGNOSIS — Z79899 Other long term (current) drug therapy: Secondary | ICD-10-CM | POA: Diagnosis not present

## 2017-01-23 DIAGNOSIS — M1812 Unilateral primary osteoarthritis of first carpometacarpal joint, left hand: Secondary | ICD-10-CM | POA: Diagnosis not present

## 2017-01-23 DIAGNOSIS — M19032 Primary osteoarthritis, left wrist: Secondary | ICD-10-CM | POA: Diagnosis not present

## 2017-01-23 DIAGNOSIS — W1839XA Other fall on same level, initial encounter: Secondary | ICD-10-CM | POA: Diagnosis not present

## 2017-01-23 DIAGNOSIS — Y999 Unspecified external cause status: Secondary | ICD-10-CM | POA: Diagnosis not present

## 2017-01-23 DIAGNOSIS — Z880 Allergy status to penicillin: Secondary | ICD-10-CM | POA: Diagnosis not present

## 2017-01-23 DIAGNOSIS — M25432 Effusion, left wrist: Secondary | ICD-10-CM | POA: Diagnosis not present

## 2017-01-23 DIAGNOSIS — S60222A Contusion of left hand, initial encounter: Secondary | ICD-10-CM | POA: Diagnosis not present

## 2017-01-23 DIAGNOSIS — W19XXXA Unspecified fall, initial encounter: Secondary | ICD-10-CM | POA: Diagnosis not present

## 2017-01-23 DIAGNOSIS — S5012XA Contusion of left forearm, initial encounter: Secondary | ICD-10-CM | POA: Diagnosis not present

## 2017-01-23 DIAGNOSIS — I10 Essential (primary) hypertension: Secondary | ICD-10-CM | POA: Diagnosis not present

## 2017-01-23 DIAGNOSIS — Z885 Allergy status to narcotic agent status: Secondary | ICD-10-CM | POA: Diagnosis not present

## 2017-01-23 DIAGNOSIS — Y9301 Activity, walking, marching and hiking: Secondary | ICD-10-CM | POA: Diagnosis not present

## 2017-01-23 DIAGNOSIS — Z888 Allergy status to other drugs, medicaments and biological substances status: Secondary | ICD-10-CM | POA: Diagnosis not present

## 2017-01-23 DIAGNOSIS — R2232 Localized swelling, mass and lump, left upper limb: Secondary | ICD-10-CM | POA: Diagnosis not present

## 2017-01-23 DIAGNOSIS — M1990 Unspecified osteoarthritis, unspecified site: Secondary | ICD-10-CM | POA: Diagnosis not present

## 2017-01-30 DIAGNOSIS — M25532 Pain in left wrist: Secondary | ICD-10-CM | POA: Diagnosis not present

## 2017-01-30 DIAGNOSIS — M79642 Pain in left hand: Secondary | ICD-10-CM | POA: Diagnosis not present

## 2017-01-30 DIAGNOSIS — M25512 Pain in left shoulder: Secondary | ICD-10-CM | POA: Diagnosis not present

## 2017-01-30 DIAGNOSIS — S62309A Unspecified fracture of unspecified metacarpal bone, initial encounter for closed fracture: Secondary | ICD-10-CM | POA: Diagnosis not present

## 2017-01-30 DIAGNOSIS — I10 Essential (primary) hypertension: Secondary | ICD-10-CM | POA: Diagnosis not present

## 2017-02-19 DIAGNOSIS — Z79899 Other long term (current) drug therapy: Secondary | ICD-10-CM | POA: Diagnosis not present

## 2017-02-19 DIAGNOSIS — F419 Anxiety disorder, unspecified: Secondary | ICD-10-CM | POA: Diagnosis not present

## 2017-02-19 DIAGNOSIS — I10 Essential (primary) hypertension: Secondary | ICD-10-CM | POA: Diagnosis not present

## 2017-02-19 DIAGNOSIS — G546 Phantom limb syndrome with pain: Secondary | ICD-10-CM | POA: Diagnosis not present

## 2017-02-19 DIAGNOSIS — F4310 Post-traumatic stress disorder, unspecified: Secondary | ICD-10-CM | POA: Diagnosis not present

## 2017-02-25 DIAGNOSIS — M25512 Pain in left shoulder: Secondary | ICD-10-CM | POA: Diagnosis not present

## 2017-02-25 DIAGNOSIS — S4292XD Fracture of left shoulder girdle, part unspecified, subsequent encounter for fracture with routine healing: Secondary | ICD-10-CM | POA: Diagnosis not present

## 2017-02-25 DIAGNOSIS — M75102 Unspecified rotator cuff tear or rupture of left shoulder, not specified as traumatic: Secondary | ICD-10-CM | POA: Diagnosis not present

## 2017-02-25 DIAGNOSIS — M898X8 Other specified disorders of bone, other site: Secondary | ICD-10-CM | POA: Diagnosis not present

## 2017-02-25 DIAGNOSIS — Z006 Encounter for examination for normal comparison and control in clinical research program: Secondary | ICD-10-CM | POA: Diagnosis not present

## 2017-02-25 DIAGNOSIS — M19012 Primary osteoarthritis, left shoulder: Secondary | ICD-10-CM | POA: Diagnosis not present

## 2017-02-25 DIAGNOSIS — I10 Essential (primary) hypertension: Secondary | ICD-10-CM | POA: Diagnosis not present

## 2017-02-25 DIAGNOSIS — M19032 Primary osteoarthritis, left wrist: Secondary | ICD-10-CM | POA: Diagnosis not present

## 2017-02-25 DIAGNOSIS — M75122 Complete rotator cuff tear or rupture of left shoulder, not specified as traumatic: Secondary | ICD-10-CM | POA: Diagnosis not present

## 2017-02-25 DIAGNOSIS — Z9889 Other specified postprocedural states: Secondary | ICD-10-CM | POA: Diagnosis not present

## 2017-03-05 DIAGNOSIS — M75122 Complete rotator cuff tear or rupture of left shoulder, not specified as traumatic: Secondary | ICD-10-CM | POA: Diagnosis not present

## 2017-03-24 DIAGNOSIS — I10 Essential (primary) hypertension: Secondary | ICD-10-CM | POA: Diagnosis not present

## 2017-03-24 DIAGNOSIS — F439 Reaction to severe stress, unspecified: Secondary | ICD-10-CM | POA: Diagnosis not present

## 2017-03-24 DIAGNOSIS — Z885 Allergy status to narcotic agent status: Secondary | ICD-10-CM | POA: Diagnosis not present

## 2017-03-24 DIAGNOSIS — Z89611 Acquired absence of right leg above knee: Secondary | ICD-10-CM | POA: Diagnosis not present

## 2017-03-24 DIAGNOSIS — J9801 Acute bronchospasm: Secondary | ICD-10-CM | POA: Diagnosis not present

## 2017-03-24 DIAGNOSIS — R000 Tachycardia, unspecified: Secondary | ICD-10-CM | POA: Diagnosis not present

## 2017-03-24 DIAGNOSIS — Z880 Allergy status to penicillin: Secondary | ICD-10-CM | POA: Diagnosis not present

## 2017-03-24 DIAGNOSIS — R0600 Dyspnea, unspecified: Secondary | ICD-10-CM | POA: Diagnosis not present

## 2017-03-24 DIAGNOSIS — Z79899 Other long term (current) drug therapy: Secondary | ICD-10-CM | POA: Diagnosis not present

## 2017-03-24 DIAGNOSIS — R079 Chest pain, unspecified: Secondary | ICD-10-CM | POA: Diagnosis not present

## 2017-03-24 DIAGNOSIS — Z888 Allergy status to other drugs, medicaments and biological substances status: Secondary | ICD-10-CM | POA: Diagnosis not present

## 2017-03-24 DIAGNOSIS — M1990 Unspecified osteoarthritis, unspecified site: Secondary | ICD-10-CM | POA: Diagnosis not present

## 2017-03-24 DIAGNOSIS — Z049 Encounter for examination and observation for unspecified reason: Secondary | ICD-10-CM | POA: Diagnosis not present

## 2017-03-24 DIAGNOSIS — R0602 Shortness of breath: Secondary | ICD-10-CM | POA: Diagnosis not present

## 2017-03-27 ENCOUNTER — Emergency Department (HOSPITAL_COMMUNITY): Payer: Medicare Other

## 2017-03-27 ENCOUNTER — Other Ambulatory Visit: Payer: Self-pay

## 2017-03-27 ENCOUNTER — Emergency Department (HOSPITAL_COMMUNITY)
Admission: EM | Admit: 2017-03-27 | Discharge: 2017-03-28 | Disposition: A | Discharge status: Home / Self Care | Payer: Medicare Other | Attending: Emergency Medicine | Admitting: Emergency Medicine

## 2017-03-27 ENCOUNTER — Encounter (HOSPITAL_COMMUNITY): Payer: Self-pay | Admitting: *Deleted

## 2017-03-27 DIAGNOSIS — R002 Palpitations: Secondary | ICD-10-CM | POA: Diagnosis not present

## 2017-03-27 DIAGNOSIS — R0789 Other chest pain: Secondary | ICD-10-CM | POA: Insufficient documentation

## 2017-03-27 DIAGNOSIS — I10 Essential (primary) hypertension: Secondary | ICD-10-CM | POA: Insufficient documentation

## 2017-03-27 DIAGNOSIS — S299XXA Unspecified injury of thorax, initial encounter: Secondary | ICD-10-CM | POA: Diagnosis not present

## 2017-03-27 DIAGNOSIS — S279XXA Injury of unspecified intrathoracic organ, initial encounter: Secondary | ICD-10-CM | POA: Diagnosis not present

## 2017-03-27 DIAGNOSIS — R079 Chest pain, unspecified: Secondary | ICD-10-CM

## 2017-03-27 DIAGNOSIS — Z79899 Other long term (current) drug therapy: Secondary | ICD-10-CM | POA: Diagnosis not present

## 2017-03-27 LAB — CBC
HCT: 44.7 % (ref 39.0–52.0)
Hemoglobin: 14.7 g/dL (ref 13.0–17.0)
MCH: 29.2 pg (ref 26.0–34.0)
MCHC: 32.9 g/dL (ref 30.0–36.0)
MCV: 88.9 fL (ref 78.0–100.0)
Platelets: 265 10*3/uL (ref 150–400)
RBC: 5.03 MIL/uL (ref 4.22–5.81)
RDW: 15.5 % (ref 11.5–15.5)
WBC: 8.9 10*3/uL (ref 4.0–10.5)

## 2017-03-27 LAB — BASIC METABOLIC PANEL
Anion gap: 10 (ref 5–15)
BUN: 20 mg/dL (ref 6–20)
CALCIUM: 8.8 mg/dL — AB (ref 8.9–10.3)
CHLORIDE: 107 mmol/L (ref 101–111)
CO2: 20 mmol/L — AB (ref 22–32)
Creatinine, Ser: 0.85 mg/dL (ref 0.61–1.24)
GFR calc non Af Amer: >60 mL/min (ref >60)
GLUCOSE: 87 mg/dL (ref 65–99)
Potassium: 4 mmol/L (ref 3.5–5.1)
Sodium: 137 mmol/L (ref 135–145)

## 2017-03-27 LAB — I-STAT TROPONIN, ED: Troponin i, poc: 0 ng/mL (ref 0.00–0.08)

## 2017-03-27 MED ORDER — FENTANYL CITRATE (PF) 100 MCG/2ML IJ SOLN
50.0000 ug | INTRAMUSCULAR | Status: DC | PRN
  Administered 2017-03-27: 50 ug via NASAL
  Filled 2017-03-27: qty 2

## 2017-03-27 MED ORDER — ONDANSETRON 4 MG PO TBDP
4.0000 mg | ORAL_TABLET | Freq: Once | ORAL | Status: AC
  Administered 2017-03-27: 4 mg via ORAL
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
## 2017-03-27 NOTE — ED Triage Notes (Signed)
Spoke with Wells Guiles AD who agreed pt is able to go to waiting area until a room is available.

## 2017-03-27 NOTE — ED Notes (Signed)
Pt son to triage station stating his dad has been here 5 hrs, and no doctor has seen him. Informed son that his dad has been here 4 hrs and he is waiting like the rest of the ppl in a waiting area to see a doctor.

## 2017-03-27 NOTE — ED Notes (Signed)
Rounding on patient - says his CP is getting worse, radiating to left side of neck. Repeat EKG pending.

## 2017-03-28 DIAGNOSIS — R0789 Other chest pain: Secondary | ICD-10-CM | POA: Diagnosis not present

## 2017-03-28 LAB — I-STAT TROPONIN, ED: TROPONIN I, POC: 0.01 ng/mL (ref 0.00–0.08)

## 2017-03-28 MED ORDER — PROMETHAZINE HCL 25 MG/ML IJ SOLN
25.0000 mg | Freq: Once | INTRAMUSCULAR | Status: AC
  Administered 2017-03-28: 25 mg via INTRAMUSCULAR
  Filled 2017-03-28: qty 1

## 2017-03-28 MED ORDER — HYDROCODONE-ACETAMINOPHEN 5-325 MG PO TABS
2.0000 | ORAL_TABLET | Freq: Once | ORAL | Status: AC
  Administered 2017-03-28: 2 via ORAL
  Filled 2017-03-28: qty 2

## 2017-03-28 NOTE — Discharge Instructions (Signed)
Continue your medications as previously prescribed.  Follow-up with your primary doctor this week for a recheck.

## 2017-03-28 NOTE — ED Provider Notes (Signed)
[facility]ER[facility]ENC[facility] DEPARTMENT Provider Note   CSN: [id number] Arrival date & time: 03/27/17  1633     History   Chief Complaint Chief Complaint  Patient presents with  . Chest Pain  . Palpitations    HPI Jason [name] is a 52 y.o. male.  Patient is a 52 year old male with past medical history of right AKA secondary to infection from prior orthopedic surgery.  He presents today for evaluation of chest pain.  He describes pressure in his left upper chest radiating into his neck and arm that has been ongoing since this morning.  He denies any fevers or chills.  He denies any cough.  He does report feeling nauseated and short of breath.  He has no prior cardiac history and had a normal cardiac cath in April 2018.  Also of note is that the patient was seen at [facility] 2 days ago and had extensive workup.  He had negative troponins and a negative VQ scan.   The history is provided by the patient.  Chest Pain   This is a recurrent problem. Episode onset: This morning. The problem occurs constantly. The problem has been gradually worsening. The pain is moderate. The quality of the pain is described as pressure-like. The pain radiates to the left neck and left shoulder. Associated symptoms include nausea, palpitations and shortness of breath. He has tried nothing for the symptoms. The treatment provided no relief. There are no known risk factors.  Palpitations   Associated symptoms include chest pain, nausea and shortness of breath.    Past Medical History:  Diagnosis Date  . Anxiety   . Bronchitis   . Complication of anesthesia   . Hypertension   . Neuromuscular disorder ([facility])    carpal tunnel bilateral, ulner nerve surgery  . PONV (postoperative nausea and vomiting)   . Spinal headache    "long time ago"    Patient Active Problem List   Diagnosis Date Noted  . Chest pain 07/14/2016  . Essential hypertension 07/14/2016  . S/P AKA (above knee  amputation) unilateral, right ([facility]) 07/14/2016  . DJD (degenerative joint disease) 11/29/2011  . Septic arthritis of knee, right ([facility]) 11/29/2011  . Boils 11/29/2011  . Shoulder impingement syndrome, left 07/29/2011    Past Surgical History:  Procedure Laterality Date  . APPENDECTOMY    . CHOLECYSTECTOMY    . IRRIGATION AND DEBRIDEMENT KNEE  11/26/2011   Procedure: IRRIGATION AND DEBRIDEMENT KNEE;  Surgeon: Kerin Salen, MD;  Location: [facility];  Service: Orthopedics;  Laterality: Right;  . KNEE ARTHROSCOPY  10/24/2011   Procedure: ARTHROSCOPY KNEE;  Surgeon: Kerin Salen, MD;  Location: [facility];  Service: Orthopedics;  Laterality: Right;  . KNEE SURGERY     7 knee surgeries on right, and 4 knee surgeries left  . LEFT HEART CATH AND CORONARY ANGIOGRAPHY N/A 07/14/2016   Procedure: Left Heart Cath and Coronary Angiography;  Surgeon: Belva Crome, MD;  Location: [facility];  Service: Cardiovascular;  Laterality: N/A;  . SHOULDER ARTHROSCOPY  07/29/2011   Procedure: ARTHROSCOPY SHOULDER;  Surgeon: Mcarthur Rossetti, MD;  Location: [facility];  Service: Orthopedics;  Laterality: Left;  Left shoulder arthroscopy with minimal debridement, left wrist steroid injection  . SHOULDER SURGERY     3 surgeries on right, 2 surgeries on left  . ULNAR NERVE REPAIR         Home Medications    Prior to Admission medications   Medication  Sig Start Date End Date Taking? Authorizing Provider  acetaminophen (TYLENOL) 325 MG tablet Take 325-650 mg by mouth every 6 (six) hours as needed (for pain).    Yes [provider]  ALPRAZolam Duanne Moron) 1 MG tablet Take 1 mg by mouth 3 (three) times daily as needed (anxiety or agitation).    Yes [provider]  EPINEPHrine 0.3 mg/0.3 mL IJ SOAJ injection Inject 0.3 mg into the muscle once as needed for anaphylaxis. 05/28/16  Yes [provider]  gabapentin (NEURONTIN) 800 MG tablet Take 800-1,600 mg by mouth See admin instructions. 800 mg  in the morning then 800 mg at noon(time) then 1,600 mg at bedtime 05/28/16  Yes [provider]  ibuprofen (ADVIL,MOTRIN) 200 MG tablet Take 200-400 mg by mouth every 6 (six) hours as needed (for pain).   Yes [provider]  ondansetron (ZOFRAN-ODT) 4 MG disintegrating tablet Take 4 mg by mouth every 8 (eight) hours as needed for nausea.  01/06/16  Yes [provider]  oxyCODONE (ROXICODONE) 15 MG immediate release tablet Take 15 mg by mouth every 6 (six) hours as needed for pain.  03/27/17  Yes [provider]  zolpidem (AMBIEN CR) 12.5 MG CR tablet Take 12.5 mg by mouth at bedtime.  07/07/16 07/07/17 Yes [provider]  zolpidem (AMBIEN) 10 MG tablet Take 10 mg by mouth at bedtime. 03/18/17  Yes [provider]  oxyCODONE (OXY IR/ROXICODONE) 5 MG immediate release tablet Take 1 tablet (5 mg total) by mouth every 6 (six) hours as needed for severe pain. Patient not taking: Reported on 03/27/2017 07/16/16   Bonnielee Haff, MD  pantoprazole (PROTONIX) 40 MG tablet Take 1 tablet (40 mg total) by mouth daily at 12 noon. Patient not taking: Reported on 03/27/2017 07/16/16   Bonnielee Haff, MD    Family History Family History  Problem Relation Age of Onset  . Heart attack Father   . Heart attack Sister   . Arrhythmia Sister        Long QT syndrome, PPM    Social History Social History   Tobacco Use  . Smoking status: Never Smoker  . Smokeless tobacco: Never Used  Substance Use Topics  . Alcohol use: No  . Drug use: No     Allergies   Iodine; Ivp dye [iodinated diagnostic agents]; Metrizamide; Other; Reglan [metoclopramide]; Shellfish-derived products; Toradol [ketorolac tromethamine]; Lidocaine; Morphine; Vancomycin; Codeine; Methadone; Propoxyphene; Amoxicillin; Penicillins; and Tape   Review of Systems Review of Systems  Respiratory: Positive for shortness of breath.   Cardiovascular: Positive for chest pain and palpitations.    Gastrointestinal: Positive for nausea.  All other systems reviewed and are negative.    Physical Exam Updated Vital Signs BP (!) 133/104 (BP Location: Right Arm)   Pulse 85   Temp 97.9 F (36.6 C) (Oral)   Resp 16   Ht 5\' 10"  (1.778 m)   Wt 95.3 kg (210 lb)   SpO2 97%   BMI 30.13 kg/m   Physical Exam  Constitutional: He is oriented to person, place, and time. He appears well-developed and well-nourished. No distress.  HENT:  Head: Normocephalic and atraumatic.  Mouth/Throat: Oropharynx is clear and moist.  Neck: Normal range of motion. Neck supple.  Cardiovascular: Normal rate and regular rhythm. Exam reveals no friction rub.  No murmur heard. Pulmonary/Chest: Effort normal and breath sounds normal. No respiratory distress. He has no wheezes. He has no rales.  Abdominal: Soft. Bowel sounds are normal. He exhibits no  distension. There is no tenderness.  Musculoskeletal: Normal range of motion. He exhibits no edema.  Neurological: He is alert and oriented to person, place, and time. Coordination normal.  Skin: Skin is warm and dry. He is not diaphoretic.  Nursing note and vitals reviewed.    ED Treatments / Results  Labs (all labs ordered are listed, but only abnormal results are displayed) Labs Reviewed  BASIC METABOLIC PANEL - Abnormal; Notable for the following components:      Result Value   CO2 20 (*)    Calcium 8.8 (*)    All other components within normal limits  CBC  I-STAT TROPONIN, ED  I-STAT TROPONIN, ED    EKG  EKG Interpretation  Date/Time:  Friday March 27 2017 22:25:49 EST Ventricular Rate:  85 PR Interval:  152 QRS Duration: 96 QT Interval:  360 QTC Calculation: 428 R Axis:   4 Text Interpretation:  Normal sinus rhythm Normal ECG Confirmed by Veryl Speak [phone]) on 03/28/2017 12:56:44 AM       Radiology Dg Chest 2 View  Result Date: 03/27/2017 CLINICAL DATA:  Chest pressure for 2 hours, worsening. Nausea and weakness. EXAM: CHEST   2 VIEW COMPARISON:  07/15/2016 FINDINGS: Low lung volumes are present, causing crowding of the pulmonary vasculature. Accounting for this, the lungs appear grossly clear aside from some mild subsegmental atelectasis at the left lung base. Heart size is exaggerated by the low lung volumes but overall thought to be within normal limits. No pleural effusion. IMPRESSION: 1. Low lung volumes. Otherwise no significant abnormality is observed. Electronically Signed   By: Van Clines M.D.   On: 03/27/2017 17:39    Procedures Procedures (including critical care time)  Medications Ordered in ED Medications  fentaNYL (SUBLIMAZE) injection 50 mcg (50 mcg Nasal Given 03/27/17 1704)  promethazine (PHENERGAN) injection 25 mg (not administered)  HYDROcodone-acetaminophen (NORCO/VICODIN) 5-325 MG per tablet 2 tablet (not administered)  ondansetron (ZOFRAN-ODT) disintegrating tablet 4 mg (4 mg Oral Given 03/27/17 1704)     Initial Impression / Assessment and Plan / ED Course  I have reviewed the triage vital signs and the nursing notes.  Pertinent labs & imaging results that were available during my care of the patient were reviewed by me and considered in my medical decision making (see chart for details).  This patient is a 52 year old male presenting with complaints of chest discomfort.  He reports pressure in the left upper chest with radiation to the left arm and jaw.  There is nothing in his workup today that would suggest a cardiac etiology.  His troponin x2 is negative and EKG is unchanged.  The remainder of the workup is also unremarkable.  He was seen at [facility] 2 days ago and had a negative VQ scan, thus I feel ruling out PE.  At this point I feel as though he is appropriate for discharge, to follow-up with his primary doctor.  Final Clinical Impressions(s) / ED Diagnoses   Final diagnoses:  None    ED Discharge Orders    None       Veryl Speak, MD 03/28/17 [phone]

## 2017-04-06 DIAGNOSIS — M79604 Pain in right leg: Secondary | ICD-10-CM | POA: Diagnosis not present

## 2017-04-06 DIAGNOSIS — M25519 Pain in unspecified shoulder: Secondary | ICD-10-CM | POA: Diagnosis not present

## 2017-04-06 DIAGNOSIS — T8789 Other complications of amputation stump: Secondary | ICD-10-CM | POA: Diagnosis not present

## 2017-04-06 DIAGNOSIS — I10 Essential (primary) hypertension: Secondary | ICD-10-CM | POA: Diagnosis not present

## 2017-04-06 DIAGNOSIS — G8929 Other chronic pain: Secondary | ICD-10-CM | POA: Diagnosis not present

## 2017-04-06 DIAGNOSIS — Z79899 Other long term (current) drug therapy: Secondary | ICD-10-CM | POA: Diagnosis not present

## 2017-04-06 DIAGNOSIS — G546 Phantom limb syndrome with pain: Secondary | ICD-10-CM | POA: Diagnosis not present

## 2017-04-06 DIAGNOSIS — M79609 Pain in unspecified limb: Secondary | ICD-10-CM | POA: Diagnosis not present

## 2017-04-08 DIAGNOSIS — Z885 Allergy status to narcotic agent status: Secondary | ICD-10-CM | POA: Diagnosis not present

## 2017-04-08 DIAGNOSIS — M25712 Osteophyte, left shoulder: Secondary | ICD-10-CM | POA: Diagnosis not present

## 2017-04-08 DIAGNOSIS — Z91041 Radiographic dye allergy status: Secondary | ICD-10-CM | POA: Diagnosis not present

## 2017-04-08 DIAGNOSIS — I110 Hypertensive heart disease with heart failure: Secondary | ICD-10-CM | POA: Diagnosis not present

## 2017-04-08 DIAGNOSIS — Z79899 Other long term (current) drug therapy: Secondary | ICD-10-CM | POA: Diagnosis not present

## 2017-04-08 DIAGNOSIS — G2581 Restless legs syndrome: Secondary | ICD-10-CM | POA: Diagnosis not present

## 2017-04-08 DIAGNOSIS — S46112A Strain of muscle, fascia and tendon of long head of biceps, left arm, initial encounter: Secondary | ICD-10-CM | POA: Diagnosis not present

## 2017-04-08 DIAGNOSIS — M7542 Impingement syndrome of left shoulder: Secondary | ICD-10-CM | POA: Diagnosis not present

## 2017-04-08 DIAGNOSIS — Z7982 Long term (current) use of aspirin: Secondary | ICD-10-CM | POA: Diagnosis not present

## 2017-04-08 DIAGNOSIS — Z89611 Acquired absence of right leg above knee: Secondary | ICD-10-CM | POA: Diagnosis not present

## 2017-04-08 DIAGNOSIS — G8918 Other acute postprocedural pain: Secondary | ICD-10-CM | POA: Diagnosis not present

## 2017-04-08 DIAGNOSIS — K219 Gastro-esophageal reflux disease without esophagitis: Secondary | ICD-10-CM | POA: Diagnosis not present

## 2017-04-08 DIAGNOSIS — Z9189 Other specified personal risk factors, not elsewhere classified: Secondary | ICD-10-CM | POA: Diagnosis not present

## 2017-04-08 DIAGNOSIS — M25812 Other specified joint disorders, left shoulder: Secondary | ICD-10-CM | POA: Diagnosis not present

## 2017-04-08 DIAGNOSIS — M19012 Primary osteoarthritis, left shoulder: Secondary | ICD-10-CM | POA: Diagnosis not present

## 2017-04-08 DIAGNOSIS — Z91048 Other nonmedicinal substance allergy status: Secondary | ICD-10-CM | POA: Diagnosis not present

## 2017-04-08 DIAGNOSIS — F4310 Post-traumatic stress disorder, unspecified: Secondary | ICD-10-CM | POA: Diagnosis not present

## 2017-04-08 DIAGNOSIS — Z880 Allergy status to penicillin: Secondary | ICD-10-CM | POA: Diagnosis not present

## 2017-04-08 DIAGNOSIS — Z888 Allergy status to other drugs, medicaments and biological substances status: Secondary | ICD-10-CM | POA: Diagnosis not present

## 2017-04-08 DIAGNOSIS — Z79891 Long term (current) use of opiate analgesic: Secondary | ICD-10-CM | POA: Diagnosis not present

## 2017-04-08 DIAGNOSIS — E785 Hyperlipidemia, unspecified: Secondary | ICD-10-CM | POA: Diagnosis not present

## 2017-04-08 DIAGNOSIS — D649 Anemia, unspecified: Secondary | ICD-10-CM | POA: Diagnosis not present

## 2017-04-08 DIAGNOSIS — I509 Heart failure, unspecified: Secondary | ICD-10-CM | POA: Diagnosis not present

## 2017-04-20 DIAGNOSIS — Z89611 Acquired absence of right leg above knee: Secondary | ICD-10-CM | POA: Diagnosis not present

## 2017-04-20 DIAGNOSIS — M19072 Primary osteoarthritis, left ankle and foot: Secondary | ICD-10-CM | POA: Diagnosis not present

## 2017-04-20 DIAGNOSIS — S93402A Sprain of unspecified ligament of left ankle, initial encounter: Secondary | ICD-10-CM | POA: Diagnosis not present

## 2017-04-20 DIAGNOSIS — M7752 Other enthesopathy of left foot: Secondary | ICD-10-CM | POA: Diagnosis not present

## 2017-04-20 DIAGNOSIS — Y999 Unspecified external cause status: Secondary | ICD-10-CM | POA: Diagnosis not present

## 2017-04-20 DIAGNOSIS — M25572 Pain in left ankle and joints of left foot: Secondary | ICD-10-CM | POA: Diagnosis not present

## 2017-04-20 DIAGNOSIS — W108XXA Fall (on) (from) other stairs and steps, initial encounter: Secondary | ICD-10-CM | POA: Diagnosis not present

## 2017-05-06 ENCOUNTER — Ambulatory Visit (INDEPENDENT_AMBULATORY_CARE_PROVIDER_SITE_OTHER): Payer: Medicare Other | Admitting: Family Medicine

## 2017-05-06 ENCOUNTER — Encounter: Payer: Self-pay | Admitting: Family Medicine

## 2017-05-06 VITALS — BP 123/82 | HR 83 | Temp 98.2°F | Ht 70.0 in | Wt 222.0 lb

## 2017-05-06 DIAGNOSIS — Z89611 Acquired absence of right leg above knee: Secondary | ICD-10-CM

## 2017-05-06 DIAGNOSIS — G8929 Other chronic pain: Secondary | ICD-10-CM

## 2017-05-06 DIAGNOSIS — K219 Gastro-esophageal reflux disease without esophagitis: Secondary | ICD-10-CM

## 2017-05-06 DIAGNOSIS — G546 Phantom limb syndrome with pain: Secondary | ICD-10-CM | POA: Diagnosis not present

## 2017-05-06 DIAGNOSIS — M6258 Muscle wasting and atrophy, not elsewhere classified, other site: Secondary | ICD-10-CM | POA: Diagnosis not present

## 2017-05-06 DIAGNOSIS — M7542 Impingement syndrome of left shoulder: Secondary | ICD-10-CM | POA: Diagnosis not present

## 2017-05-06 DIAGNOSIS — I10 Essential (primary) hypertension: Secondary | ICD-10-CM

## 2017-05-06 DIAGNOSIS — F1120 Opioid dependence, uncomplicated: Secondary | ICD-10-CM | POA: Insufficient documentation

## 2017-05-06 DIAGNOSIS — K591 Functional diarrhea: Secondary | ICD-10-CM | POA: Diagnosis not present

## 2017-05-06 DIAGNOSIS — F5101 Primary insomnia: Secondary | ICD-10-CM | POA: Insufficient documentation

## 2017-05-06 MED ORDER — OXYCODONE HCL 15 MG PO TABS: 15.0000 mg | ORAL_TABLET | Freq: Four times a day (QID) | ORAL | 0 refills | Status: DC | PRN

## 2017-05-06 MED ORDER — ZOLPIDEM TARTRATE 10 MG PO TABS: 10.0000 mg | ORAL_TABLET | Freq: Every day | ORAL | 5 refills | Status: DC

## 2017-05-06 MED ORDER — ZOLPIDEM TARTRATE 3.5 MG SL SUBL: SUBLINGUAL_TABLET | SUBLINGUAL | 2 refills | Status: DC

## 2017-05-06 MED ORDER — DIPHENOXYLATE-ATROPINE 2.5-0.025 MG PO TABS: ORAL_TABLET | ORAL | 2 refills | Status: DC

## 2017-05-06 MED ORDER — PANTOPRAZOLE SODIUM 40 MG PO TBEC: 40.0000 mg | DELAYED_RELEASE_TABLET | Freq: Every day | ORAL | 5 refills | Status: DC

## 2017-05-06 MED ORDER — ALPRAZOLAM 1 MG PO TABS: 1.0000 mg | ORAL_TABLET | Freq: Three times a day (TID) | ORAL | 0 refills | Status: DC | PRN

## 2017-05-06 MED ORDER — GABAPENTIN 800 MG PO TABS: ORAL_TABLET | ORAL | 5 refills | Status: DC

## 2017-05-06 NOTE — Progress Notes (Signed)
Subjective:  Patient ID: Jason [name], male    DOB: 1966-02-10  Age: 52 y.o. MRN: [id number]  CC: New Patient (Initial Visit) (pt here today to establish care)   HPI [name] presents for phantom limb pain.  He is trying to learn to walk again but his right above-the-knee amputation requires a prosthesis.  Unfortunately he is lost some muscle mass since the original fitting of the prosthesis and now it is loose and jobs up and down when he tries to walk and causes intense pain at the very end of the femur.  He needs a new socket that is customized to the current size of his legs so that he can begin walking more appropriately.  He still has a great deal of pain.  That is treated with twice daily oxycodone.  He needs referral to pain clinic.  He is not planning to go back to his previous provider.  He has recently moved to this area.  He is trying to establish with someone closer to home.  However he realizes with his combination medicines that he will need a pain clinic to work with him.  Specifically he is currently taking alprazolam based on anxiety caused by his disability and pain.  He denies symptoms of depression.  He has been maintained on the Xanax for a extended time of several years apparently.  He lost his leg after a work-related injury.  He had to have knee surgery.  After the surgery the knee became infected.  The original injury was in 2011.  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  Patient relates that he was told that his kidneys could no longer tolerate the treatment for the MRSA so infection would have to be treated with amputation or he could end up on dialysis.  Patient deals with insomnia routinely.  He has been taking Ambien 10 mg to help him sleep.  Unfortunately he wakes during the night so he was told he could take 12-1/2 member milligrams of Ambien CR to help keep him  asleep.  Additionally for his phantom pain he takes gabapentin see med list below.  He had a gallbladder surgery about 15 years ago that led to chronic recurring diarrhea/dumping syndrome.  He uses periodic PR and Lomotil to relieve the loose stools.  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  He is aware of the potential for overdose that goes with mixing benzodiazepines and opiates.  He has been under a pain contract with a previous provider and has been counseled about that before as well.  Additionally he is treated for high blood pressure he believes the agent is atenolol.  He needs that refilled.  Blood pressure has been doing pretty well.  He does not check it regularly at home.  History Bazil has a past medical history of Allergy, Anxiety, Arthritis, Bronchitis, Chest pain (11/01/5629), Complication of anesthesia, DJD (degenerative joint disease) (11/29/2011), Hypertension, Neuromuscular disorder ([place]), PONV (postoperative nausea and vomiting), Septic arthritis of knee, right ([place]) (11/29/2011), and Spinal headache.   He has a past surgical history that includes Knee surgery; Shoulder surgery; Ulnar nerve repair; Appendectomy; Cholecystectomy; Shoulder arthroscopy (07/29/2011); Knee arthroscopy (10/24/2011); Irrigation and debridement knee (11/26/2011); LEFT HEART CATH AND CORONARY ANGIOGRAPHY (N/A, 07/14/2016); and Above knee leg amputaton (Right, 05/26/2014).   His family history includes Arrhythmia in his sister; COPD in his father; Diabetes  in his father; Drug abuse in his brother; Heart attack in his father and sister; Hypertension in his father and mother.He reports that  has never smoked. he has never used smokeless tobacco. He reports that he does not drink alcohol or use drugs.  Current Outpatient Medications on File Prior to Visit  Medication Sig Dispense Refill  . acetaminophen (TYLENOL) 325 MG tablet Take 325-650 mg by mouth every 6 (six) hours as needed (for pain).     Marland Kitchen  EPINEPHrine 0.3 mg/0.3 mL IJ SOAJ injection Inject 0.3 mg into the muscle once as needed for anaphylaxis.    Marland Kitchen ibuprofen (ADVIL,MOTRIN) 200 MG tablet Take 200-400 mg by mouth every 6 (six) hours as needed (for pain).    . ondansetron (ZOFRAN-ODT) 4 MG disintegrating tablet Take 4 mg by mouth every 8 (eight) hours as needed for nausea.      No current facility-administered medications on file prior to visit.     ROS Review of Systems  Constitutional: Negative for activity change, appetite change, chills, diaphoresis, fatigue, fever and unexpected weight change.  HENT: Negative for congestion, ear pain, hearing loss, postnasal drip, rhinorrhea, sore throat, tinnitus and trouble swallowing.   Eyes: Negative for photophobia, pain, discharge and redness.  Respiratory: Negative for apnea, cough, choking, chest tightness, shortness of breath, wheezing and stridor.   Cardiovascular: Negative for chest pain, palpitations and leg swelling.  Gastrointestinal: Negative for abdominal distention, abdominal pain, blood in stool, constipation, diarrhea, nausea and vomiting.       History of reflux is stable as long as he takes his pantoprazole.  Endocrine: Negative for cold intolerance, heat intolerance, polydipsia, polyphagia and polyuria.  Genitourinary: Negative for difficulty urinating, dysuria, enuresis, flank pain, frequency, genital sores, hematuria and urgency.  Musculoskeletal: Positive for arthralgias (Patient recently had left shoulder surgery as well for an injury.  Surgery was approximately 1 month ago.  He is still having postop discomfort in the shoulder.  He is still under the care of his orthopedist.), back pain, gait problem (Related to right AKA) and myalgias. Negative for joint swelling.  Skin: Negative for color change, rash and wound.  Allergic/Immunologic: Negative for immunocompromised state.  Neurological: Negative for dizziness, tremors, seizures, syncope, facial asymmetry, speech  difficulty, weakness, light-headedness, numbness and headaches.  Hematological: Does not bruise/bleed easily.  Psychiatric/Behavioral: Positive for agitation and sleep disturbance. Negative for behavioral problems, confusion, decreased concentration, dysphoric mood, hallucinations and suicidal ideas. The patient is nervous/anxious. The patient is not hyperactive.     Objective:  BP 123/82   Pulse 83   Temp 98.2 F (36.8 C) (Oral)   Ht 5\' 10"  (1.778 m)   Wt 222 lb (100.7 kg)   BMI 31.85 kg/m   Physical Exam  Constitutional: He is oriented to person, place, and time. He appears well-developed and well-nourished. No distress.  HENT:  Head: Atraumatic.  Eyes: Conjunctivae and EOM are normal. Pupils are equal, round, and reactive to light. Right eye exhibits no discharge. Left eye exhibits no discharge. No scleral icterus.  Neck: Normal range of motion. Neck supple. No JVD present. No thyromegaly present.  Cardiovascular: Normal rate, regular rhythm and normal heart sounds. Exam reveals no gallop and no friction rub.  No murmur heard. Pulmonary/Chest: Effort normal and breath sounds normal.  Abdominal: Soft. Bowel sounds are normal. He exhibits no distension. There is no tenderness.  Musculoskeletal: Normal range of motion. He exhibits no edema or tenderness.  Right above-the-knee amputation is noted.  Stump shows signs  of muscle atrophy in that there is noted a smaller muscle mass on the right as compared to the left thigh.  Lymphadenopathy:    He has no cervical adenopathy.  Neurological: He is alert and oriented to person, place, and time. He has normal reflexes. No cranial nerve deficit. He exhibits normal muscle tone. Coordination normal.  Skin: Skin is warm and dry.  Psychiatric: He has a normal mood and affect. His behavior is normal. Thought content normal.    Assessment & Plan:   Reginal was seen today for new patient (initial visit).  Diagnoses and all orders for this  visit:  Phantom limb pain ([place]) -     Ambulatory referral to Pain Clinic -     gabapentin (NEURONTIN) 800 MG tablet; 800 mg in the morning then 800 mg at noon(time) then 1,600 mg at bedtime -     oxyCODONE (ROXICODONE) 15 MG immediate release tablet; Take 1 tablet (15 mg total) by mouth every 6 (six) hours as needed for pain.  Other chronic pain -     Ambulatory referral to Pain Clinic -     gabapentin (NEURONTIN) 800 MG tablet; 800 mg in the morning then 800 mg at noon(time) then 1,600 mg at bedtime -     oxyCODONE (ROXICODONE) 15 MG immediate release tablet; Take 1 tablet (15 mg total) by mouth every 6 (six) hours as needed for pain.  Muscle atrophy of lower extremity -     DME Other see comment  Functional diarrhea -     diphenoxylate-atropine (LOMOTIL) 2.5-0.025 MG tablet; Take 1-2 QID as needed for loose stools  Primary insomnia -     zolpidem (AMBIEN) 10 MG tablet; Take 1 tablet (10 mg total) by mouth at bedtime. -     ALPRAZolam (XANAX) 1 MG tablet; Take 1 tablet (1 mg total) by mouth 3 (three) times daily as needed (anxiety or agitation). -     Zolpidem Tartrate 3.5 MG SUBL; Take once during the night if he will wake from sleep 4 or more hours before your normal awakening time  Uncomplicated opioid dependence ([place])  Essential hypertension  Shoulder impingement syndrome, left -     gabapentin (NEURONTIN) 800 MG tablet; 800 mg in the morning then 800 mg at noon(time) then 1,600 mg at bedtime -     oxyCODONE (ROXICODONE) 15 MG immediate release tablet; Take 1 tablet (15 mg total) by mouth every 6 (six) hours as needed for pain.  S/P AKA (above knee amputation) unilateral, right ([place]) -     DME Other see comment -     oxyCODONE (ROXICODONE) 15 MG immediate release tablet; Take 1 tablet (15 mg total) by mouth every 6 (six) hours as needed for pain.  Gastroesophageal reflux disease without esophagitis -     pantoprazole (PROTONIX) 40 MG tablet; Take 1 tablet (40 mg total) by  mouth daily at 12 noon.   I have discontinued [name]. Wesch's zolpidem and oxyCODONE. I have also changed his gabapentin, zolpidem, ALPRAZolam, and oxyCODONE. Additionally, I am having him start on diphenoxylate-atropine and Zolpidem Tartrate. Lastly, I am having him maintain his EPINEPHrine, ondansetron, acetaminophen, ibuprofen, and pantoprazole.  Meds ordered this encounter  Medications  . gabapentin (NEURONTIN) 800 MG tablet    Sig: 800 mg in the morning then 800 mg at noon(time) then 1,600 mg at bedtime    Dispense:  120 tablet    Refill:  5  . pantoprazole (PROTONIX) 40 MG tablet  Sig: Take 1 tablet (40 mg total) by mouth daily at 12 noon.    Dispense:  30 tablet    Refill:  5  . zolpidem (AMBIEN) 10 MG tablet    Sig: Take 1 tablet (10 mg total) by mouth at bedtime.    Dispense:  30 tablet    Refill:  5  . diphenoxylate-atropine (LOMOTIL) 2.5-0.025 MG tablet    Sig: Take 1-2 QID as needed for loose stools    Dispense:  30 tablet    Refill:  2  . ALPRAZolam (XANAX) 1 MG tablet    Sig: Take 1 tablet (1 mg total) by mouth 3 (three) times daily as needed (anxiety or agitation).    Dispense:  90 tablet    Refill:  0  . oxyCODONE (ROXICODONE) 15 MG immediate release tablet    Sig: Take 1 tablet (15 mg total) by mouth every 6 (six) hours as needed for pain.    Dispense:  120 tablet    Refill:  0  . Zolpidem Tartrate 3.5 MG SUBL    Sig: Take once during the night if he will wake from sleep 4 or more hours before your normal awakening time    Dispense:  30 tablet    Refill:  2   Previous records were found and reviewed for his infectious disease encounters that led to his amputation.  And records will be obtained from [place] regarding his pain contract and amputation history.  Chest x-ray of 03/27/2017 reviewed as well.  This showed decreased lung volumes and atelectasis.  Patient was not completely aware of his blood pressure medication decided that he thinks he takes  atenolol.  He will contact the office with details so that that can be refilled for him.  Follow-up: Return in about 1 month (around 06/03/2017), or if symptoms worsen or fail to improve.  Claretta Fraise, M.D.

## 2017-05-15 ENCOUNTER — Telehealth: Payer: Self-pay | Admitting: Family Medicine

## 2017-05-15 ENCOUNTER — Other Ambulatory Visit: Payer: Self-pay | Admitting: Family Medicine

## 2017-05-15 MED ORDER — ZOLPIDEM TARTRATE 5 MG/ACT PO SOLN: ORAL | 2 refills | Status: DC

## 2017-05-15 NOTE — Telephone Encounter (Signed)
Please tell the patient needs open missed is not exactly what I had thought it was when I mentioned it to him last week.  What I gave him to go under his tongue is the one that will help him get back to sleep when he wakes up during the night.  Zolpimist is meant to be taken at bedtime to help promote sleep.  It is only used once at least 7 hours before time to awaken.  If he would like to try that instead of his zolpidem, Ambien, tablets I will be happy to call that in for him.  It is just a little different from what I had thought it was when I spoke to him last week.

## 2017-05-15 NOTE — Telephone Encounter (Signed)
Zolpidem Tartrate 5 MG/[facility] has some concerns. Please them back.

## 2017-05-15 NOTE — Telephone Encounter (Signed)
Pt advised of MD feedback and pt states he would like to try the mist.

## 2017-05-15 NOTE — Telephone Encounter (Signed)
I sent in the requested prescription 

## 2017-05-16 ENCOUNTER — Other Ambulatory Visit: Payer: Self-pay | Admitting: Family Medicine

## 2017-05-18 ENCOUNTER — Telehealth: Payer: Self-pay | Admitting: Family Medicine

## 2017-05-18 ENCOUNTER — Other Ambulatory Visit: Payer: Self-pay | Admitting: *Deleted

## 2017-05-18 ENCOUNTER — Other Ambulatory Visit: Payer: Self-pay | Admitting: Family Medicine

## 2017-05-18 MED ORDER — ZOLPIDEM TARTRATE 5 MG/ACT PO SOLN: ORAL | 2 refills | Status: DC

## 2017-05-18 NOTE — Telephone Encounter (Signed)
Spoke with pharmacist at [facility] where the Ambien mist prescription was sent.  They are unable to get this medication.  I also spoke with the patient who had contacted [facility] and they have the medication.  We need to [facility] so [facility] can dispense.  Please sign the attach pended prescription for Ambien Mist.

## 2017-05-18 NOTE — Telephone Encounter (Signed)
Please cdisregard the one month only notice. Please refill as is. Thanks, WS

## 2017-05-18 NOTE — Telephone Encounter (Signed)
Authorize 30 days only. Then contact the patient letting them know that they will need an appointment before any further prescriptions can be sent in. 

## 2017-05-18 NOTE — Telephone Encounter (Signed)
Patient aware.

## 2017-05-19 ENCOUNTER — Telehealth: Payer: Self-pay | Admitting: *Deleted

## 2017-05-19 ENCOUNTER — Other Ambulatory Visit: Payer: Self-pay | Admitting: Family Medicine

## 2017-05-19 MED ORDER — ZOLPIDEM TARTRATE ER 12.5 MG PO TBCR: 12.5000 mg | EXTENDED_RELEASE_TABLET | Freq: Every evening | ORAL | 2 refills | Status: DC | PRN

## 2017-05-19 NOTE — Telephone Encounter (Signed)
Can you please send in rx for oral zolpidem electronically to pharmacy

## 2017-05-19 NOTE — Telephone Encounter (Signed)
He will need to go back  To the oral zolpidem.

## 2017-05-19 NOTE — Telephone Encounter (Signed)
[facility]ax received [facility] Zolpimist 5mg  oral spray Not covered by insurance Please advise or send alternative

## 2017-05-20 ENCOUNTER — Telehealth: Payer: Self-pay | Admitting: *Deleted

## 2017-05-20 ENCOUNTER — Telehealth: Payer: Self-pay | Admitting: Family Medicine

## 2017-05-20 ENCOUNTER — Other Ambulatory Visit: Payer: Self-pay | Admitting: Family Medicine

## 2017-05-20 MED ORDER — ZOLPIDEM TARTRATE ER 12.5 MG PO TBCR: 12.5000 mg | EXTENDED_RELEASE_TABLET | Freq: Every evening | ORAL | 2 refills | Status: DC | PRN

## 2017-05-20 NOTE — Telephone Encounter (Signed)
I sent in the requested prescription 

## 2017-05-20 NOTE — Telephone Encounter (Signed)
[facility]ax received [facility] requested Zolpidem tart ED 12.5 mg tab not covered by ins Please advise, plain Zolpidem maybe covered

## 2017-05-20 NOTE — Telephone Encounter (Signed)
Please appeal for prior authorization since the zolpidem does not keep him asleep but the zolpidem CR does.

## 2017-05-21 ENCOUNTER — Telehealth: Payer: Self-pay | Admitting: Family Medicine

## 2017-05-21 NOTE — Telephone Encounter (Signed)
Is this ok to do? Please advise and route to [facility]

## 2017-05-22 NOTE — Telephone Encounter (Signed)
Patient aware that letter up front to be picked up

## 2017-05-22 NOTE — Telephone Encounter (Signed)
Please  write and I will sign. Thanks, WS 

## 2017-06-02 DIAGNOSIS — G546 Phantom limb syndrome with pain: Secondary | ICD-10-CM | POA: Diagnosis not present

## 2017-06-02 DIAGNOSIS — Z79899 Other long term (current) drug therapy: Secondary | ICD-10-CM | POA: Diagnosis not present

## 2017-06-05 ENCOUNTER — Other Ambulatory Visit: Payer: Self-pay | Admitting: Family Medicine

## 2017-06-05 DIAGNOSIS — F5101 Primary insomnia: Secondary | ICD-10-CM

## 2017-06-05 NOTE — Telephone Encounter (Signed)
Forward to pcp/[facility]

## 2017-06-12 ENCOUNTER — Telehealth: Payer: Self-pay | Admitting: Family Medicine

## 2017-06-12 ENCOUNTER — Other Ambulatory Visit: Payer: Self-pay | Admitting: Family Medicine

## 2017-06-12 MED ORDER — ZOLPIDEM TARTRATE 10 MG PO TABS: 10.0000 mg | ORAL_TABLET | Freq: Every evening | ORAL | 0 refills | Status: DC | PRN

## 2017-06-12 NOTE — Telephone Encounter (Signed)
Please contact the patient.He has usd both in the past. I can do one or the other, but not both. See which he prefers.

## 2017-06-12 NOTE — Telephone Encounter (Signed)
Please review and advise and route to [facility]

## 2017-06-12 NOTE — Telephone Encounter (Signed)
Left detailed message on patient's voice mail.

## 2017-06-12 NOTE — Telephone Encounter (Signed)
I sent in a scrip for the immediate release 10 mg. He can pick it up as soon as April 6 at [place] [facility]. WS

## 2017-06-12 NOTE — Telephone Encounter (Signed)
Patient is requesting the ambien 10mg 

## 2017-06-16 ENCOUNTER — Ambulatory Visit: Payer: Medicare Other | Admitting: Family Medicine

## 2017-06-17 ENCOUNTER — Encounter: Payer: Self-pay | Admitting: Family Medicine

## 2017-06-26 ENCOUNTER — Encounter: Payer: Self-pay | Admitting: Family Medicine

## 2017-06-26 ENCOUNTER — Ambulatory Visit (INDEPENDENT_AMBULATORY_CARE_PROVIDER_SITE_OTHER): Payer: Medicare Other | Admitting: Family Medicine

## 2017-06-26 VITALS — BP 118/76 | HR 92 | Ht 70.0 in | Wt 220.5 lb

## 2017-06-26 DIAGNOSIS — G546 Phantom limb syndrome with pain: Secondary | ICD-10-CM

## 2017-06-26 DIAGNOSIS — F11282 Opioid dependence with opioid-induced sleep disorder: Secondary | ICD-10-CM | POA: Diagnosis not present

## 2017-06-26 DIAGNOSIS — G4701 Insomnia due to medical condition: Secondary | ICD-10-CM | POA: Diagnosis not present

## 2017-06-26 DIAGNOSIS — G8929 Other chronic pain: Secondary | ICD-10-CM | POA: Diagnosis not present

## 2017-06-26 DIAGNOSIS — Z89611 Acquired absence of right leg above knee: Secondary | ICD-10-CM

## 2017-06-26 DIAGNOSIS — K219 Gastro-esophageal reflux disease without esophagitis: Secondary | ICD-10-CM | POA: Diagnosis not present

## 2017-06-26 MED ORDER — ZOLPIDEM TARTRATE ER 12.5 MG PO TBCR: EXTENDED_RELEASE_TABLET | ORAL | 5 refills | Status: DC

## 2017-06-26 MED ORDER — ZOLPIDEM TARTRATE 10 MG PO TABS: 10.0000 mg | ORAL_TABLET | Freq: Every evening | ORAL | 0 refills | Status: DC | PRN

## 2017-06-26 MED ORDER — PANTOPRAZOLE SODIUM 40 MG PO TBEC: 40.0000 mg | DELAYED_RELEASE_TABLET | Freq: Two times a day (BID) | ORAL | 5 refills | Status: DC

## 2017-06-26 NOTE — Progress Notes (Signed)
Subjective:  Patient ID: Jason [name], male    DOB: Apr 06, 1965  Age: 52 y.o. MRN: [id number]  CC: Follow-up (pt here today to discuss physical therapy at home since he can't drive and also having insomnia)   HPI Jason [name] presents for problems with transportation.  He cannot drive due to his amputation.  He continues to have phantom limb pain.  He has trouble getting transportation just to get to his doctor's appointments for this office and for his pain office.  Driving to physical therapy he is not feasible for him.  He would like to have home health to come in to work with him for strengthening the amputated leg so that he can work on getting his prosthetic device fitted properly.  Patient continues to have trouble with sleep.  He can get to sleep with the standard Ambien but he awakens during the night.  On the other hand with the controlled release Ambien he cannot get to sleep but he stays asleep once he gets to sleep after several hours a day awake.  He has been intolerant of trazodone and mirtazapine to help him stay asleep.  His insurance would not cover the sublingual rapid acting zolpidem such as ZolpiMist for nighttime use when he awakens.  Therefore he is requesting that I consider giving him Ambien CR 12.5 in addition to his Ambien 10 mg daily which she has used in the past.  I got a call from his pharmacist later in the day stating that this is why he had a parting of the ways with his previous medical practice.  Depression screen [facility] 2/9 06/26/2017 05/18/2017  Decreased Interest 0 0  Down, Depressed, Hopeless 1 0  PHQ - 2 Score 1 0    History Jason [name] has a past medical history of Allergy, Anxiety, Arthritis, Bronchitis, Chest pain (2/50/5397), Complication of anesthesia, DJD (degenerative joint disease) (11/29/2011), Hypertension, Neuromuscular disorder ([place]), PONV (postoperative nausea and vomiting), Septic arthritis of knee, right ([place]) (11/29/2011), and Spinal headache.   He  has a past surgical history that includes Knee surgery; Shoulder surgery; Ulnar nerve repair; Appendectomy; Cholecystectomy; Shoulder arthroscopy (07/29/2011); Knee arthroscopy (10/24/2011); Irrigation and debridement knee (11/26/2011); LEFT HEART CATH AND CORONARY ANGIOGRAPHY (N/A, 07/14/2016); and Above knee leg amputaton (Right, 05/26/2014).   His family history includes Arrhythmia in his sister; COPD in his father; Diabetes in his father; Drug abuse in his brother; Heart attack in his father and sister; Hypertension in his father and mother.He reports that he has never smoked. He has never used smokeless tobacco. He reports that he does not drink alcohol or use drugs.    ROS Review of Systems  Objective:  BP 118/76   Pulse 92   Ht 5\' 10"  (1.778 m)   Wt 220 lb 8 oz (100 kg)   BMI 31.64 kg/m   BP Readings from Last 3 Encounters:  06/26/17 118/76  05/06/17 123/82  03/28/17 112/68    Wt Readings from Last 3 Encounters:  06/26/17 220 lb 8 oz (100 kg)  05/06/17 222 lb (100.7 kg)  03/27/17 210 lb (95.3 kg)     Physical Exam    Assessment & Plan:   Jason [name] was seen today for follow-up.  Diagnoses and all orders for this visit:  Insomnia due to medical condition  Gastroesophageal reflux disease without esophagitis -     pantoprazole (PROTONIX) 40 MG tablet; Take 1 tablet (40 mg total) by mouth 2 (two) times daily. -  Ambulatory referral to [place] [facility])  Other chronic pain  S/P AKA (above knee amputation) unilateral, right (HCC)  Opioid dependence with opioid-induced sleep disorder ([place])  Other orders -     zolpidem (AMBIEN) 10 MG tablet; Take 1 tablet (10 mg total) by mouth at bedtime as needed for sleep. -     zolpidem (AMBIEN CR) 12.5 MG CR tablet; Take 1 by mouth QHS for early awakening.       I have changed Jason [name]'s pantoprazole. I am also having him start on zolpidem. Additionally, I am having him maintain his EPINEPHrine,  ondansetron, acetaminophen, ibuprofen, gabapentin, diphenoxylate-atropine, oxyCODONE, ALPRAZolam, and zolpidem.  Allergies as of 06/26/2017      Reactions   Iodine Rash   Reports localized reaction at IV site. Able to tolerate with Benadryl.    Ivp Dye [iodinated Diagnostic Agents] Anaphylaxis   Can be pre-treated with Benadryl   Metrizamide Anaphylaxis   Other Other (See Comments)   Under no circumstances will the patient agree to a PICC line   Reglan [metoclopramide] Anaphylaxis   Shellfish-derived Products Anaphylaxis   Toradol [ketorolac Tromethamine] Itching   Patient having systemic itching after receiving IV dose   Lidocaine Other (See Comments), Rash   Pt not sure if this is an actual allergy - might have been a one time incident   Morphine Swelling, Rash   Local reaction to IV being pushed too fast   Vancomycin Rash   Has had vancomycin since this reaction and had no reaction at all   Codeine Hives   Methadone Hives   Propoxyphene Hives   Amoxicillin Rash   Patient denies true reaction   Penicillins Rash   Has patient had a PCN reaction causing immediate rash, facial/tongue/throat swelling, SOB or lightheadedness with hypotension: Yes Has patient had a PCN reaction causing severe rash involving mucus membranes or skin necrosis: No Has patient had a PCN reaction that required hospitalization No Has patient had a PCN reaction occurring within the last 10 years: No If all of the above answers are "NO", then may proceed with Cephalosporin use.   Tape Rash      Medication List        Accurate as of 06/26/17 11:59 PM. Always use your most recent med list.          acetaminophen 325 MG tablet Commonly known as:  TYLENOL Take 325-650 mg by mouth every 6 (six) hours as needed (for pain).   ALPRAZolam 1 MG tablet Commonly known as:  XANAX TAKE (1) TABLET BY MOUTH THREE TIMES DAILY AS NEEDED FOR ANXIETY OR AGITATION.   diphenoxylate-atropine 2.5-0.025 MG  tablet Commonly known as:  LOMOTIL Take 1-2 QID as needed for loose stools   EPINEPHrine 0.3 mg/0.3 mL Soaj injection Commonly known as:  EPI-PEN Inject 0.3 mg into the muscle once as needed for anaphylaxis.   gabapentin 800 MG tablet Commonly known as:  NEURONTIN 800 mg in the morning then 800 mg at noon(time) then 1,600 mg at bedtime   ibuprofen 200 MG tablet Commonly known as:  ADVIL,MOTRIN Take 200-400 mg by mouth every 6 (six) hours as needed (for pain).   ondansetron 4 MG disintegrating tablet Commonly known as:  ZOFRAN-ODT Take 4 mg by mouth every 8 (eight) hours as needed for nausea.   oxyCODONE 15 MG immediate release tablet Commonly known as:  ROXICODONE Take 1 tablet (15 mg total) by mouth every 6 (six) hours as needed  for pain.   pantoprazole 40 MG tablet Commonly known as:  PROTONIX Take 1 tablet (40 mg total) by mouth 2 (two) times daily.   zolpidem 10 MG tablet Commonly known as:  AMBIEN Take 1 tablet (10 mg total) by mouth at bedtime as needed for sleep.   zolpidem 12.5 MG CR tablet Commonly known as:  AMBIEN CR Take 1 by mouth QHS for early awakening.      I reviewed the patient's PMP aware profile.  He does take oxycodone.  He is under the care of a nurse practitioner in [facility] for this currently.  I gave him a bridging supply due to leaving Dr. Stark Jock.  I will be continuing to give the benzodiazepines for now.  I will be monitoring his usage closely and using PMP aware plus drug testing for that.  I asked the pharmacist to truncate his prescription at 3 months and he will need to see me at 3 months for refills.  This is in spite of the potential for refills being 6 months for those medications.  However he is using a fairly large dose when added up between the 2 types of zolpidem plus the Xanax.  Patient certainly has reason for his symptoms with the obvious amputation and phantom limb pain.  I have seen local neurologists use a similar combination of  zolpidem and other patients.  Patient has by his history tolerated this well in the past.  I will look at Dr. Darcella Cheshire records as soon as we can get those made available.  I would like to see what his concern was if more than just the obvious high-dose.  Patient is not driving so the concern regarding sleep driving should not be an issue.  Patient has been made aware of the risk of overdose and understands the responsibility that goes with the use of benzodiazepine and opiate containing medications particularly if exceeding the prescribed dose of either.  In particular since his dose of zolpidem has been liberalized he must not exceed the prescribed amount of either that or Xanax at any time and he must immediately report to me any increase in his dose of oxycodone.  Follow-up: Return in about 3 months (around 09/25/2017).  Claretta Fraise, M.D.

## 2017-06-28 ENCOUNTER — Encounter: Payer: Self-pay | Admitting: Family Medicine

## 2017-07-07 DIAGNOSIS — T8789 Other complications of amputation stump: Secondary | ICD-10-CM | POA: Diagnosis not present

## 2017-07-07 DIAGNOSIS — Z79899 Other long term (current) drug therapy: Secondary | ICD-10-CM | POA: Diagnosis not present

## 2017-07-07 DIAGNOSIS — G546 Phantom limb syndrome with pain: Secondary | ICD-10-CM | POA: Diagnosis not present

## 2017-07-07 DIAGNOSIS — M79606 Pain in leg, unspecified: Secondary | ICD-10-CM | POA: Diagnosis not present

## 2017-07-30 ENCOUNTER — Other Ambulatory Visit: Payer: Self-pay | Admitting: Family Medicine

## 2017-07-30 ENCOUNTER — Telehealth: Payer: Self-pay | Admitting: Pediatrics

## 2017-07-30 DIAGNOSIS — F5101 Primary insomnia: Secondary | ICD-10-CM

## 2017-07-30 NOTE — Telephone Encounter (Signed)
Aware that refill sent to pharmacy

## 2017-07-30 NOTE — Telephone Encounter (Signed)
Dr. Livia Snellen FYI--pt regularly fills alprazolam Rx 2-4 days early for the past 12 months per [place]. I sent in 10 days (#30 tabs) starting 30 days from his previous Rx fill date (4/22).   Last Rxs for #90 tabs filled 06/08/17, 2/25, 1/28, 12/31, 12/3, 11/5, 10/9, 9/11, 8/15, 7/19, 6/20, 08/08/16

## 2017-07-30 NOTE — Telephone Encounter (Signed)
Dr. Livia Snellen FYI--pt regularly fills alprazolam Rx 2-4 days early for the past 12 months per [place]. I sent in 10 days (#30 tabs) starting 30 days from his previous Rx fill date (4/22).   Last Rxs for #90 tabs filled 3/25, 2/25, 1/28, 12/31, 12/3, 11/5, 10/9, 9/11, 8/15, 7/19, 6/20, 08/08/16

## 2017-08-11 ENCOUNTER — Other Ambulatory Visit: Payer: Self-pay | Admitting: Pediatrics

## 2017-08-11 DIAGNOSIS — G546 Phantom limb syndrome with pain: Secondary | ICD-10-CM | POA: Diagnosis not present

## 2017-08-11 DIAGNOSIS — Z79899 Other long term (current) drug therapy: Secondary | ICD-10-CM | POA: Diagnosis not present

## 2017-08-11 DIAGNOSIS — F5101 Primary insomnia: Secondary | ICD-10-CM

## 2017-08-11 DIAGNOSIS — M79606 Pain in leg, unspecified: Secondary | ICD-10-CM | POA: Diagnosis not present

## 2017-08-11 DIAGNOSIS — T8789 Other complications of amputation stump: Secondary | ICD-10-CM | POA: Diagnosis not present

## 2017-08-11 NOTE — Telephone Encounter (Signed)
Last seen 06/26/17

## 2017-08-13 ENCOUNTER — Other Ambulatory Visit: Payer: Self-pay | Admitting: Family Medicine

## 2017-08-13 DIAGNOSIS — F5101 Primary insomnia: Secondary | ICD-10-CM

## 2017-08-14 ENCOUNTER — Ambulatory Visit (INDEPENDENT_AMBULATORY_CARE_PROVIDER_SITE_OTHER): Payer: Medicare Other | Admitting: Family Medicine

## 2017-08-14 VITALS — BP 126/80 | HR 95 | Temp 97.1°F | Ht 70.0 in | Wt 216.0 lb

## 2017-08-14 DIAGNOSIS — K591 Functional diarrhea: Secondary | ICD-10-CM | POA: Diagnosis not present

## 2017-08-14 DIAGNOSIS — R1032 Left lower quadrant pain: Secondary | ICD-10-CM

## 2017-08-14 DIAGNOSIS — F5101 Primary insomnia: Secondary | ICD-10-CM

## 2017-08-14 DIAGNOSIS — Z89611 Acquired absence of right leg above knee: Secondary | ICD-10-CM

## 2017-08-14 DIAGNOSIS — G546 Phantom limb syndrome with pain: Secondary | ICD-10-CM

## 2017-08-14 LAB — URINALYSIS
Bilirubin, UA: NEGATIVE
Glucose, UA: NEGATIVE
Ketones, UA: NEGATIVE
Leukocytes, UA: NEGATIVE
NITRITE UA: NEGATIVE
PH UA: 5.5 (ref 5.0–7.5)
Urobilinogen, Ur: 0.2 mg/dL (ref 0.2–1.0)

## 2017-08-14 MED ORDER — METRONIDAZOLE 500 MG PO TABS: 500.0000 mg | ORAL_TABLET | Freq: Three times a day (TID) | ORAL | 0 refills | Status: DC

## 2017-08-14 MED ORDER — CIPROFLOXACIN HCL 500 MG PO TABS: 500.0000 mg | ORAL_TABLET | Freq: Two times a day (BID) | ORAL | 0 refills | Status: DC

## 2017-08-14 MED ORDER — CHOLESTYRAMINE 4 GM/DOSE PO POWD: 4.0000 g | Freq: Two times a day (BID) | ORAL | 11 refills | Status: DC

## 2017-08-14 MED ORDER — ALPRAZOLAM 1 MG PO TABS: ORAL_TABLET | ORAL | 4 refills | Status: DC

## 2017-08-14 NOTE — Progress Notes (Addendum)
Subjective:  Patient ID: Jason [name], male    DOB: 1965/09/22  Age: 52 y.o. MRN: [id number]  CC: Abdominal Pain (pt here today c/o stomach pain having loose stools after every meal and it is bright yellow)  HPI Jason [name] presents for abdominal pain getting worse. Diffuse, but worse at left lower  Quadrant. Pt. Has chronic diarrhea, but notes increase recently in number and volume of stool. Denies blood in stool.  Also needs new prosthesis for RLE.due to socket limiting ROM. Losing suction, causing pain and instability.  Depression screen [facility] 2/9 06/26/2017 05/18/2017  Decreased Interest 0 0  Down, Depressed, Hopeless 1 0  PHQ - 2 Score 1 0    History Jason [name] has a past medical history of Allergy, Anxiety, Arthritis, Bronchitis, Chest pain (05/12/3333), Complication of anesthesia, DJD (degenerative joint disease) (11/29/2011), Hypertension, Neuromuscular disorder ([place]), PONV (postoperative nausea and vomiting), Septic arthritis of knee, right ([place]) (11/29/2011), and Spinal headache.   He has a past surgical history that includes Knee surgery; Shoulder surgery; Ulnar nerve repair; Appendectomy; Cholecystectomy; Shoulder arthroscopy (07/29/2011); Knee arthroscopy (10/24/2011); Irrigation and debridement knee (11/26/2011); LEFT HEART CATH AND CORONARY ANGIOGRAPHY (N/A, 07/14/2016); and Above knee leg amputaton (Right, 05/26/2014).   His family history includes Arrhythmia in his sister; COPD in his father; Diabetes in his father; Drug abuse in his brother; Heart attack in his father and sister; Hypertension in his father and mother.He reports that he has never smoked. He has never used smokeless tobacco. He reports that he does not drink alcohol or use drugs.    ROS Review of Systems  Constitutional: Negative.   HENT: Negative.   Eyes: Negative for visual disturbance.  Respiratory: Negative for cough and shortness of breath.   Cardiovascular: Negative for chest pain and leg swelling.    Gastrointestinal: Positive for abdominal pain and diarrhea. Negative for nausea and vomiting.  Genitourinary: Negative for difficulty urinating.  Musculoskeletal: Negative for arthralgias and myalgias.  Skin: Negative for rash.  Neurological: Negative for headaches.  Psychiatric/Behavioral: Negative for sleep disturbance.    Objective:  BP 126/80   Pulse 95   Temp (!) 97.1 F (36.2 C) (Oral)   Ht 5' 10"  (1.778 m)   Wt 216 lb (98 kg)   BMI 30.99 kg/m   BP Readings from Last 3 Encounters:  08/14/17 126/80  06/26/17 118/76  05/06/17 123/82    Wt Readings from Last 3 Encounters:  08/14/17 216 lb (98 kg)  06/26/17 220 lb 8 oz (100 kg)  05/06/17 222 lb (100.7 kg)     Physical Exam  Constitutional: He appears well-developed and well-nourished.  HENT:  Head: Normocephalic and atraumatic.  Right Ear: Tympanic membrane and external ear normal. No decreased hearing is noted.  Left Ear: Tympanic membrane and external ear normal. No decreased hearing is noted.  Mouth/Throat: No oropharyngeal exudate or posterior oropharyngeal erythema.  Eyes: Pupils are equal, round, and reactive to light.  Neck: Normal range of motion. Neck supple.  Cardiovascular: Normal rate and regular rhythm.  No murmur heard. Pulmonary/Chest: Breath sounds normal. No respiratory distress.  Abdominal: Soft. Normal appearance and bowel sounds are normal. He exhibits no distension, no ascites and no mass. There is tenderness in the left lower quadrant.  Skin: Skin is warm and dry.  Vitals reviewed.     Assessment & Plan:   Jason [name] was seen today for abdominal pain.  Diagnoses and all orders for this visit:  Functional diarrhea  Phantom limb pain ([place])  Left lower quadrant pain -     Lipase -     CBC with Differential/Platelet -     CMP14+EGFR -     Urinalysis  Primary insomnia -     ALPRAZolam (XANAX) 1 MG tablet; TAKE (1) TABLET BY MOUTH THREE TIMES DAILY AS NEEDED FOR ANXIETY OR  AGITATION.  S/P AKA (above knee amputation) unilateral, right (HCC)  Other orders -     metroNIDAZOLE (FLAGYL) 500 MG tablet; Take 1 tablet (500 mg total) by mouth 3 (three) times daily. -     ciprofloxacin (CIPRO) 500 MG tablet; Take 1 tablet (500 mg total) by mouth 2 (two) times daily. -     cholestyramine (QUESTRAN) 4 GM/DOSE powder; Take 1 packet (4 g total) by mouth 2 (two) times daily with a meal.       I am having Jason [name] start on metroNIDAZOLE, ciprofloxacin, and cholestyramine. I am also having him maintain his EPINEPHrine, ondansetron, acetaminophen, ibuprofen, gabapentin, diphenoxylate-atropine, oxyCODONE, pantoprazole, zolpidem, zolpidem, XTAMPZA ER, and ALPRAZolam.  Allergies as of 08/14/2017      Reactions   Iodine Rash   Reports localized reaction at IV site. Able to tolerate with Benadryl.    Ivp Dye [iodinated Diagnostic Agents] Anaphylaxis   Can be pre-treated with Benadryl   Metrizamide Anaphylaxis   Other Other (See Comments)   Under no circumstances will the patient agree to a PICC line   Reglan [metoclopramide] Anaphylaxis   Shellfish-derived Products Anaphylaxis   Toradol [ketorolac Tromethamine] Itching   Patient having systemic itching after receiving IV dose   Lidocaine Other (See Comments), Rash   Pt not sure if this is an actual allergy - might have been a one time incident   Morphine Swelling, Rash   Local reaction to IV being pushed too fast   Vancomycin Rash   Has had vancomycin since this reaction and had no reaction at all   Codeine Hives   Methadone Hives   Propoxyphene Hives   Amoxicillin Rash   Patient denies true reaction   Penicillins Rash   Has patient had a PCN reaction causing immediate rash, facial/tongue/throat swelling, SOB or lightheadedness with hypotension: Yes Has patient had a PCN reaction causing severe rash involving mucus membranes or skin necrosis: No Has patient had a PCN reaction that required hospitalization  No Has patient had a PCN reaction occurring within the last 10 years: No If all of the above answers are "NO", then may proceed with Cephalosporin use.   Tape Rash      Medication List        Accurate as of 08/14/17 11:59 PM. Always use your most recent med list.          acetaminophen 325 MG tablet Commonly known as:  TYLENOL Take 325-650 mg by mouth every 6 (six) hours as needed (for pain).   ALPRAZolam 1 MG tablet Commonly known as:  XANAX TAKE (1) TABLET BY MOUTH THREE TIMES DAILY AS NEEDED FOR ANXIETY OR AGITATION.   cholestyramine 4 GM/DOSE powder Commonly known as:  QUESTRAN Take 1 packet (4 g total) by mouth 2 (two) times daily with a meal.   ciprofloxacin 500 MG tablet Commonly known as:  CIPRO Take 1 tablet (500 mg total) by mouth 2 (two) times daily.   diphenoxylate-atropine 2.5-0.025 MG tablet Commonly known as:  LOMOTIL Take 1-2 QID as needed for loose stools   EPINEPHrine 0.3 mg/0.3 mL Soaj injection Commonly known as:  EPI-PEN Inject 0.3 mg  into the muscle once as needed for anaphylaxis.   gabapentin 800 MG tablet Commonly known as:  NEURONTIN 800 mg in the morning then 800 mg at noon(time) then 1,600 mg at bedtime   ibuprofen 200 MG tablet Commonly known as:  ADVIL,MOTRIN Take 200-400 mg by mouth every 6 (six) hours as needed (for pain).   metroNIDAZOLE 500 MG tablet Commonly known as:  FLAGYL Take 1 tablet (500 mg total) by mouth 3 (three) times daily.   ondansetron 4 MG disintegrating tablet Commonly known as:  ZOFRAN-ODT Take 4 mg by mouth every 8 (eight) hours as needed for nausea.   oxyCODONE 15 MG immediate release tablet Commonly known as:  ROXICODONE Take 1 tablet (15 mg total) by mouth every 6 (six) hours as needed for pain.   pantoprazole 40 MG tablet Commonly known as:  PROTONIX Take 1 tablet (40 mg total) by mouth 2 (two) times daily.   XTAMPZA ER 13.5 MG C12a Generic drug:  oxyCODONE ER   zolpidem 10 MG tablet Commonly  known as:  AMBIEN Take 1 tablet (10 mg total) by mouth at bedtime as needed for sleep.   zolpidem 12.5 MG CR tablet Commonly known as:  AMBIEN CR Take 1 by mouth QHS for early awakening.        Follow-up: Return if symptoms worsen or fail to improve.  Claretta Fraise, M.D.

## 2017-08-14 NOTE — Telephone Encounter (Signed)
Last seen 06/26/17  Dr Stacks 

## 2017-08-15 DIAGNOSIS — Z9089 Acquired absence of other organs: Secondary | ICD-10-CM | POA: Diagnosis not present

## 2017-08-15 DIAGNOSIS — R1032 Left lower quadrant pain: Secondary | ICD-10-CM | POA: Diagnosis not present

## 2017-08-15 DIAGNOSIS — Z885 Allergy status to narcotic agent status: Secondary | ICD-10-CM | POA: Diagnosis not present

## 2017-08-15 DIAGNOSIS — Z888 Allergy status to other drugs, medicaments and biological substances status: Secondary | ICD-10-CM | POA: Diagnosis not present

## 2017-08-15 DIAGNOSIS — I10 Essential (primary) hypertension: Secondary | ICD-10-CM | POA: Diagnosis not present

## 2017-08-15 DIAGNOSIS — Z91048 Other nonmedicinal substance allergy status: Secondary | ICD-10-CM | POA: Diagnosis not present

## 2017-08-15 DIAGNOSIS — Z880 Allergy status to penicillin: Secondary | ICD-10-CM | POA: Diagnosis not present

## 2017-08-15 DIAGNOSIS — Z79899 Other long term (current) drug therapy: Secondary | ICD-10-CM | POA: Diagnosis not present

## 2017-08-15 DIAGNOSIS — K660 Peritoneal adhesions (postprocedural) (postinfection): Secondary | ICD-10-CM | POA: Diagnosis not present

## 2017-08-15 DIAGNOSIS — K5900 Constipation, unspecified: Secondary | ICD-10-CM | POA: Diagnosis not present

## 2017-08-15 DIAGNOSIS — K4020 Bilateral inguinal hernia, without obstruction or gangrene, not specified as recurrent: Secondary | ICD-10-CM | POA: Diagnosis not present

## 2017-08-15 LAB — CBC WITH DIFFERENTIAL/PLATELET
BASOS ABS: 0 10*3/uL (ref 0.0–0.2)
Basos: 0 %
EOS (ABSOLUTE): 0.1 10*3/uL (ref 0.0–0.4)
Eos: 2 %
Hematocrit: 48.6 % (ref 37.5–51.0)
Hemoglobin: 16 g/dL (ref 13.0–17.7)
Immature Grans (Abs): 0 10*3/uL (ref 0.0–0.1)
Immature Granulocytes: 0 %
LYMPHS ABS: 1.4 10*3/uL (ref 0.7–3.1)
Lymphs: 20 %
MCH: 30.1 pg (ref 26.6–33.0)
MCHC: 32.9 g/dL (ref 31.5–35.7)
MCV: 92 fL (ref 79–97)
MONOS ABS: 0.5 10*3/uL (ref 0.1–0.9)
Monocytes: 7 %
Neutrophils Absolute: 5 10*3/uL (ref 1.4–7.0)
Neutrophils: 71 %
Platelets: 303 10*3/uL (ref 150–450)
RBC: 5.31 x10E6/uL (ref 4.14–5.80)
RDW: 14.2 % (ref 12.3–15.4)
WBC: 7 10*3/uL (ref 3.4–10.8)

## 2017-08-15 LAB — CMP14+EGFR
ALK PHOS: 106 IU/L (ref 39–117)
ALT: 17 IU/L (ref 0–44)
AST: 18 IU/L (ref 0–40)
Albumin/Globulin Ratio: 1.9 (ref 1.2–2.2)
Albumin: 4.9 g/dL (ref 3.5–5.5)
BUN/Creatinine Ratio: 14 (ref 9–20)
BUN: 15 mg/dL (ref 6–24)
Bilirubin Total: 0.4 mg/dL (ref 0.0–1.2)
CO2: 22 mmol/L (ref 20–29)
CREATININE: 1.06 mg/dL (ref 0.76–1.27)
Calcium: 9.6 mg/dL (ref 8.7–10.2)
Chloride: 105 mmol/L (ref 96–106)
GFR calc Af Amer: 95 mL/min/{1.73_m2} (ref >59)
GFR calc non Af Amer: 82 mL/min/{1.73_m2} (ref >59)
GLUCOSE: 109 mg/dL — AB (ref 65–99)
Globulin, Total: 2.6 g/dL (ref 1.5–4.5)
Potassium: 4.7 mmol/L (ref 3.5–5.2)
Sodium: 146 mmol/L — ABNORMAL HIGH (ref 134–144)
Total Protein: 7.5 g/dL (ref 6.0–8.5)

## 2017-08-15 LAB — LIPASE: LIPASE: 22 U/L (ref 13–78)

## 2017-08-18 DIAGNOSIS — K922 Gastrointestinal hemorrhage, unspecified: Secondary | ICD-10-CM | POA: Diagnosis not present

## 2017-08-18 DIAGNOSIS — J9811 Atelectasis: Secondary | ICD-10-CM | POA: Diagnosis not present

## 2017-08-18 DIAGNOSIS — R0602 Shortness of breath: Secondary | ICD-10-CM | POA: Diagnosis not present

## 2017-08-18 DIAGNOSIS — R1030 Lower abdominal pain, unspecified: Secondary | ICD-10-CM | POA: Diagnosis not present

## 2017-08-18 DIAGNOSIS — R1111 Vomiting without nausea: Secondary | ICD-10-CM | POA: Diagnosis not present

## 2017-08-18 DIAGNOSIS — R197 Diarrhea, unspecified: Secondary | ICD-10-CM | POA: Diagnosis not present

## 2017-08-18 DIAGNOSIS — K529 Noninfective gastroenteritis and colitis, unspecified: Secondary | ICD-10-CM | POA: Diagnosis not present

## 2017-08-18 DIAGNOSIS — R509 Fever, unspecified: Secondary | ICD-10-CM | POA: Diagnosis not present

## 2017-08-18 DIAGNOSIS — R1084 Generalized abdominal pain: Secondary | ICD-10-CM | POA: Diagnosis not present

## 2017-08-18 DIAGNOSIS — Z049 Encounter for examination and observation for unspecified reason: Secondary | ICD-10-CM | POA: Diagnosis not present

## 2017-08-18 DIAGNOSIS — R1032 Left lower quadrant pain: Secondary | ICD-10-CM | POA: Diagnosis not present

## 2017-08-18 DIAGNOSIS — R4182 Altered mental status, unspecified: Secondary | ICD-10-CM | POA: Diagnosis not present

## 2017-08-18 DIAGNOSIS — I10 Essential (primary) hypertension: Secondary | ICD-10-CM | POA: Diagnosis not present

## 2017-08-18 DIAGNOSIS — K219 Gastro-esophageal reflux disease without esophagitis: Secondary | ICD-10-CM | POA: Diagnosis not present

## 2017-08-18 DIAGNOSIS — Z89611 Acquired absence of right leg above knee: Secondary | ICD-10-CM | POA: Diagnosis not present

## 2017-08-18 DIAGNOSIS — R000 Tachycardia, unspecified: Secondary | ICD-10-CM | POA: Diagnosis not present

## 2017-08-18 DIAGNOSIS — K921 Melena: Secondary | ICD-10-CM | POA: Diagnosis not present

## 2017-08-18 DIAGNOSIS — E860 Dehydration: Secondary | ICD-10-CM | POA: Diagnosis not present

## 2017-08-18 DIAGNOSIS — R109 Unspecified abdominal pain: Secondary | ICD-10-CM | POA: Diagnosis not present

## 2017-08-18 DIAGNOSIS — Z9049 Acquired absence of other specified parts of digestive tract: Secondary | ICD-10-CM | POA: Diagnosis not present

## 2017-08-18 DIAGNOSIS — R112 Nausea with vomiting, unspecified: Secondary | ICD-10-CM | POA: Diagnosis not present

## 2017-08-19 DIAGNOSIS — R509 Fever, unspecified: Secondary | ICD-10-CM | POA: Diagnosis not present

## 2017-08-19 DIAGNOSIS — I10 Essential (primary) hypertension: Secondary | ICD-10-CM | POA: Diagnosis not present

## 2017-08-19 DIAGNOSIS — R1032 Left lower quadrant pain: Secondary | ICD-10-CM | POA: Diagnosis not present

## 2017-08-19 DIAGNOSIS — E860 Dehydration: Secondary | ICD-10-CM | POA: Diagnosis not present

## 2017-08-19 DIAGNOSIS — R112 Nausea with vomiting, unspecified: Secondary | ICD-10-CM | POA: Diagnosis not present

## 2017-08-19 DIAGNOSIS — R197 Diarrhea, unspecified: Secondary | ICD-10-CM | POA: Diagnosis not present

## 2017-08-19 DIAGNOSIS — J9811 Atelectasis: Secondary | ICD-10-CM | POA: Diagnosis not present

## 2017-08-19 DIAGNOSIS — R1084 Generalized abdominal pain: Secondary | ICD-10-CM | POA: Diagnosis not present

## 2017-08-20 ENCOUNTER — Encounter: Payer: Self-pay | Admitting: Family Medicine

## 2017-08-20 DIAGNOSIS — R1032 Left lower quadrant pain: Secondary | ICD-10-CM | POA: Diagnosis not present

## 2017-08-20 DIAGNOSIS — E860 Dehydration: Secondary | ICD-10-CM | POA: Diagnosis not present

## 2017-08-21 DIAGNOSIS — Z885 Allergy status to narcotic agent status: Secondary | ICD-10-CM | POA: Diagnosis not present

## 2017-08-21 DIAGNOSIS — Z880 Allergy status to penicillin: Secondary | ICD-10-CM | POA: Diagnosis not present

## 2017-08-21 DIAGNOSIS — G8929 Other chronic pain: Secondary | ICD-10-CM | POA: Diagnosis not present

## 2017-08-21 DIAGNOSIS — R1032 Left lower quadrant pain: Secondary | ICD-10-CM | POA: Diagnosis not present

## 2017-08-21 DIAGNOSIS — R112 Nausea with vomiting, unspecified: Secondary | ICD-10-CM | POA: Diagnosis not present

## 2017-08-21 DIAGNOSIS — E669 Obesity, unspecified: Secondary | ICD-10-CM | POA: Diagnosis present

## 2017-08-21 DIAGNOSIS — K219 Gastro-esophageal reflux disease without esophagitis: Secondary | ICD-10-CM | POA: Diagnosis present

## 2017-08-21 DIAGNOSIS — R1033 Periumbilical pain: Secondary | ICD-10-CM | POA: Diagnosis not present

## 2017-08-21 DIAGNOSIS — Z8261 Family history of arthritis: Secondary | ICD-10-CM | POA: Diagnosis not present

## 2017-08-21 DIAGNOSIS — Z9049 Acquired absence of other specified parts of digestive tract: Secondary | ICD-10-CM | POA: Diagnosis not present

## 2017-08-21 DIAGNOSIS — Z888 Allergy status to other drugs, medicaments and biological substances status: Secondary | ICD-10-CM | POA: Diagnosis not present

## 2017-08-21 DIAGNOSIS — I10 Essential (primary) hypertension: Secondary | ICD-10-CM | POA: Diagnosis present

## 2017-08-21 DIAGNOSIS — Z6830 Body mass index (BMI) 30.0-30.9, adult: Secondary | ICD-10-CM | POA: Diagnosis not present

## 2017-08-21 DIAGNOSIS — F419 Anxiety disorder, unspecified: Secondary | ICD-10-CM | POA: Diagnosis present

## 2017-08-21 DIAGNOSIS — Z79899 Other long term (current) drug therapy: Secondary | ICD-10-CM | POA: Diagnosis not present

## 2017-08-21 DIAGNOSIS — Z833 Family history of diabetes mellitus: Secondary | ICD-10-CM | POA: Diagnosis not present

## 2017-08-21 DIAGNOSIS — R748 Abnormal levels of other serum enzymes: Secondary | ICD-10-CM | POA: Diagnosis present

## 2017-08-21 DIAGNOSIS — E860 Dehydration: Secondary | ICD-10-CM | POA: Diagnosis present

## 2017-08-21 DIAGNOSIS — R109 Unspecified abdominal pain: Secondary | ICD-10-CM | POA: Diagnosis not present

## 2017-08-21 DIAGNOSIS — Z8249 Family history of ischemic heart disease and other diseases of the circulatory system: Secondary | ICD-10-CM | POA: Diagnosis not present

## 2017-08-21 DIAGNOSIS — Z89611 Acquired absence of right leg above knee: Secondary | ICD-10-CM | POA: Diagnosis not present

## 2017-08-21 DIAGNOSIS — K5900 Constipation, unspecified: Secondary | ICD-10-CM | POA: Diagnosis not present

## 2017-08-21 DIAGNOSIS — Z9889 Other specified postprocedural states: Secondary | ICD-10-CM | POA: Diagnosis not present

## 2017-08-21 DIAGNOSIS — R000 Tachycardia, unspecified: Secondary | ICD-10-CM | POA: Diagnosis present

## 2017-08-21 DIAGNOSIS — Z881 Allergy status to other antibiotic agents status: Secondary | ICD-10-CM | POA: Diagnosis not present

## 2017-08-21 DIAGNOSIS — K529 Noninfective gastroenteritis and colitis, unspecified: Secondary | ICD-10-CM | POA: Diagnosis present

## 2017-09-09 ENCOUNTER — Telehealth: Payer: Self-pay | Admitting: Family Medicine

## 2017-09-09 DIAGNOSIS — T8789 Other complications of amputation stump: Secondary | ICD-10-CM | POA: Diagnosis not present

## 2017-09-09 DIAGNOSIS — Z79899 Other long term (current) drug therapy: Secondary | ICD-10-CM | POA: Diagnosis not present

## 2017-09-09 DIAGNOSIS — G546 Phantom limb syndrome with pain: Secondary | ICD-10-CM | POA: Diagnosis not present

## 2017-09-09 DIAGNOSIS — M79606 Pain in leg, unspecified: Secondary | ICD-10-CM | POA: Diagnosis not present

## 2017-09-09 NOTE — Telephone Encounter (Signed)
Please refer as pt requests 

## 2017-09-10 ENCOUNTER — Other Ambulatory Visit: Payer: Self-pay

## 2017-09-10 DIAGNOSIS — M6258 Muscle wasting and atrophy, not elsewhere classified, other site: Secondary | ICD-10-CM

## 2017-09-10 NOTE — Telephone Encounter (Signed)
Referral done

## 2017-09-15 ENCOUNTER — Other Ambulatory Visit: Payer: Self-pay | Admitting: Family Medicine

## 2017-09-16 ENCOUNTER — Telehealth: Payer: Self-pay | Admitting: Family Medicine

## 2017-09-16 NOTE — Telephone Encounter (Signed)
Advise on refill. Back up provider's will not refill.

## 2017-09-16 NOTE — Telephone Encounter (Signed)
Aware PCP out of office.  Last chronic health visit was 06-12-2017 and lab work on 08-14-2017. Please advise on Ambein refill.

## 2017-09-18 MED ORDER — ZOLPIDEM TARTRATE ER 12.5 MG PO TBCR: EXTENDED_RELEASE_TABLET | ORAL | 1 refills | Status: DC

## 2017-09-28 ENCOUNTER — Other Ambulatory Visit: Payer: Self-pay | Admitting: Family Medicine

## 2017-09-29 ENCOUNTER — Other Ambulatory Visit: Payer: Self-pay | Admitting: Family Medicine

## 2017-09-29 ENCOUNTER — Telehealth: Payer: Self-pay | Admitting: Family Medicine

## 2017-09-29 MED ORDER — ZOLPIDEM TARTRATE ER 12.5 MG PO TBCR: EXTENDED_RELEASE_TABLET | ORAL | 4 refills | Status: DC

## 2017-09-29 MED ORDER — ZOLPIDEM TARTRATE 10 MG PO TABS: 10.0000 mg | ORAL_TABLET | Freq: Every evening | ORAL | 4 refills | Status: DC | PRN

## 2017-09-29 NOTE — Telephone Encounter (Signed)
Pharmacy called stating that they need RF for plain Ambien, They state that he takes both and they already have an order for the CR. Please review and advise

## 2017-09-29 NOTE — Telephone Encounter (Signed)
Pt should take one of eack at bedtime. WS

## 2017-10-09 DIAGNOSIS — G546 Phantom limb syndrome with pain: Secondary | ICD-10-CM | POA: Diagnosis not present

## 2017-10-09 DIAGNOSIS — G629 Polyneuropathy, unspecified: Secondary | ICD-10-CM | POA: Diagnosis not present

## 2017-10-09 DIAGNOSIS — M79606 Pain in leg, unspecified: Secondary | ICD-10-CM | POA: Diagnosis not present

## 2017-10-09 DIAGNOSIS — Z79899 Other long term (current) drug therapy: Secondary | ICD-10-CM | POA: Diagnosis not present

## 2017-10-09 DIAGNOSIS — M5410 Radiculopathy, site unspecified: Secondary | ICD-10-CM | POA: Diagnosis not present

## 2017-10-09 DIAGNOSIS — T8789 Other complications of amputation stump: Secondary | ICD-10-CM | POA: Diagnosis not present

## 2017-10-26 ENCOUNTER — Ambulatory Visit: Payer: Medicare Other | Admitting: Family Medicine

## 2017-11-06 ENCOUNTER — Encounter

## 2017-11-10 ENCOUNTER — Ambulatory Visit: Payer: Medicare Other | Admitting: Physical Therapy

## 2017-11-10 DIAGNOSIS — T8789 Other complications of amputation stump: Secondary | ICD-10-CM | POA: Diagnosis not present

## 2017-11-10 DIAGNOSIS — Z79899 Other long term (current) drug therapy: Secondary | ICD-10-CM | POA: Diagnosis not present

## 2017-11-10 DIAGNOSIS — R229 Localized swelling, mass and lump, unspecified: Secondary | ICD-10-CM | POA: Diagnosis not present

## 2017-11-10 DIAGNOSIS — M79606 Pain in leg, unspecified: Secondary | ICD-10-CM | POA: Diagnosis not present

## 2017-11-10 DIAGNOSIS — G546 Phantom limb syndrome with pain: Secondary | ICD-10-CM | POA: Diagnosis not present

## 2017-11-11 ENCOUNTER — Ambulatory Visit: Payer: Medicare Other | Attending: Family Medicine | Admitting: Physical Therapy

## 2017-11-18 ENCOUNTER — Ambulatory Visit (INDEPENDENT_AMBULATORY_CARE_PROVIDER_SITE_OTHER): Payer: Medicare Other | Admitting: Family Medicine

## 2017-11-18 ENCOUNTER — Encounter: Payer: Self-pay | Admitting: Family Medicine

## 2017-11-18 VITALS — BP 132/90 | HR 94 | Temp 97.8°F | Ht 70.0 in | Wt 216.0 lb

## 2017-11-18 DIAGNOSIS — T8789 Other complications of amputation stump: Secondary | ICD-10-CM | POA: Diagnosis not present

## 2017-11-18 DIAGNOSIS — M79609 Pain in unspecified limb: Secondary | ICD-10-CM

## 2017-11-18 DIAGNOSIS — Z89611 Acquired absence of right leg above knee: Secondary | ICD-10-CM

## 2017-11-18 DIAGNOSIS — G546 Phantom limb syndrome with pain: Secondary | ICD-10-CM

## 2017-11-18 NOTE — Progress Notes (Signed)
Subjective:  Patient ID: Jason [name], male    DOB: [birth year]  Age: 52 y.o. MRN: [id number]  CC: Leg Pain (left)   HPI Jason [name] presents for pain at the site of his right AKA.  Patient has very worried that he is going to have to go back for revision of his stump.  Every time he goes back for revision needs of having to start over with regard to his getting a prosthesis for it.  He wants a second opinion from a new orthopedist.  Currently the tip of the stump is quite tender.  He is using a compression stocking for the stump to bring down some swelling.  He has not had any fever chills or sweats and denies any erythema of the area.  Depression screen [facility] 2/9 11/18/2017 06/26/2017 05/18/2017  Decreased Interest 1 0 0  Down, Depressed, Hopeless 0 1 0  PHQ - 2 Score 1 1 0    History Jason [name] has a past medical history of Allergy, Anxiety, Arthritis, Bronchitis, Chest pain (0/93/2355), Complication of anesthesia, DJD (degenerative joint disease) (11/29/2011), Hypertension, Neuromuscular disorder ([place]), PONV (postoperative nausea and vomiting), Septic arthritis of knee, right ([place]) (11/29/2011), and Spinal headache.   He has a past surgical history that includes Knee surgery; Shoulder surgery; Ulnar nerve repair; Appendectomy; Cholecystectomy; Shoulder arthroscopy (07/29/2011); Knee arthroscopy (10/24/2011); Irrigation and debridement knee (11/26/2011); LEFT HEART CATH AND CORONARY ANGIOGRAPHY (N/A, 07/14/2016); and Above knee leg amputaton (Right, 05/26/2014).   His family history includes Arrhythmia in his sister; COPD in his father; Diabetes in his father; Drug abuse in his brother; Heart attack in his father and sister; Hypertension in his father and mother.He reports that he has never smoked. He has never used smokeless tobacco. He reports that he does not drink alcohol or use drugs.    ROS Review of Systems  Constitutional: Negative for fever.  Respiratory: Negative for shortness of breath.    Cardiovascular: Negative for chest pain.  Musculoskeletal: Positive for gait problem. Negative for arthralgias.  Skin: Negative for rash.    Objective:  BP 132/90   Pulse 94   Temp 97.8 F (36.6 C) (Oral)   Ht 5\' 10"  (1.778 m)   Wt 216 lb (98 kg)   BMI 30.99 kg/m   BP Readings from Last 3 Encounters:  11/18/17 132/90  08/14/17 126/80  06/26/17 118/76    Wt Readings from Last 3 Encounters:  11/18/17 216 lb (98 kg)  08/14/17 216 lb (98 kg)  06/26/17 220 lb 8 oz (100 kg)     Physical Exam  Constitutional: He is oriented to person, place, and time. He appears well-developed and well-nourished.  HENT:  Head: Normocephalic and atraumatic.  Right Ear: External ear normal.  Left Ear: External ear normal.  Mouth/Throat: No oropharyngeal exudate or posterior oropharyngeal erythema.  Eyes: Pupils are equal, round, and reactive to light.  Neck: Normal range of motion. Neck supple.  Cardiovascular: Normal rate and regular rhythm.  No murmur heard. Pulmonary/Chest: Breath sounds normal. No respiratory distress.  Musculoskeletal:  Patient has a right AKA stump that appears to be well-healed there is a tender point where the fat pad beneath the transected bone can be palpated.  It is not erythematous or fluctuant.  Neurological: He is alert and oriented to person, place, and time.  Vitals reviewed.     Assessment & Plan:   Trasean was seen today for leg pain.  Diagnoses and all orders for this visit:  S/P AKA (above knee amputation) unilateral, right ([place]) -     Ambulatory referral to Orthopedic Surgery  Phantom pain ([place])  Amputation stump pain ([place])       I have discontinued Jason [name]. Jason [name]'s ibuprofen, diphenoxylate-atropine, metroNIDAZOLE, ciprofloxacin, and cholestyramine. I am also having him maintain his EPINEPHrine, ondansetron, acetaminophen, gabapentin, oxyCODONE, pantoprazole, XTAMPZA ER, ALPRAZolam, zolpidem, and zolpidem.  Allergies as of 11/18/2017       Reactions   Iodine Rash   Reports localized reaction at IV site. Able to tolerate with Benadryl.    Ivp Dye [iodinated Diagnostic Agents] Anaphylaxis   Can be pre-treated with Benadryl   Metrizamide Anaphylaxis   Other Other (See Comments)   Under no circumstances will the patient agree to a PICC line   Reglan [metoclopramide] Anaphylaxis   Shellfish-derived Products Anaphylaxis   Toradol [ketorolac Tromethamine] Itching   Patient having systemic itching after receiving IV dose   Lidocaine Other (See Comments), Rash   Pt not sure if this is an actual allergy - might have been a one time incident   Morphine Swelling, Rash   Local reaction to IV being pushed too fast   Vancomycin Rash   Has had vancomycin since this reaction and had no reaction at all   Codeine Hives   Methadone Hives   Propoxyphene Hives   Amoxicillin Rash   Patient denies true reaction   Penicillins Rash   Has patient had a PCN reaction causing immediate rash, facial/tongue/throat swelling, SOB or lightheadedness with hypotension: Yes Has patient had a PCN reaction causing severe rash involving mucus membranes or skin necrosis: No Has patient had a PCN reaction that required hospitalization No Has patient had a PCN reaction occurring within the last 10 years: No If all of the above answers are "NO", then may proceed with Cephalosporin use.   Tape Rash      Medication List        Accurate as of 11/18/17  5:42 PM. Always use your most recent med list.          acetaminophen 325 MG tablet Commonly known as:  TYLENOL Take 325-650 mg by mouth every 6 (six) hours as needed (for pain).   ALPRAZolam 1 MG tablet Commonly known as:  XANAX TAKE (1) TABLET BY MOUTH THREE TIMES DAILY AS NEEDED FOR ANXIETY OR AGITATION.   EPINEPHrine 0.3 mg/0.3 mL Soaj injection Commonly known as:  EPI-PEN Inject 0.3 mg into the muscle once as needed for anaphylaxis.   gabapentin 800 MG tablet Commonly known as:  NEURONTIN 800  mg in the morning then 800 mg at noon(time) then 1,600 mg at bedtime   ondansetron 4 MG disintegrating tablet Commonly known as:  ZOFRAN-ODT Take 4 mg by mouth every 8 (eight) hours as needed for nausea.   oxyCODONE 15 MG immediate release tablet Commonly known as:  ROXICODONE Take 1 tablet (15 mg total) by mouth every 6 (six) hours as needed for pain.   pantoprazole 40 MG tablet Commonly known as:  PROTONIX Take 1 tablet (40 mg total) by mouth 2 (two) times daily.   XTAMPZA ER 13.5 MG C12a Generic drug:  oxyCODONE ER   zolpidem 10 MG tablet Commonly known as:  AMBIEN Take 1 tablet (10 mg total) by mouth at bedtime as needed for sleep.   zolpidem 12.5 MG CR tablet Commonly known as:  AMBIEN CR Take 1 by mouth QHS for early awakening.        Follow-up: Return if symptoms  worsen or fail to improve.  Claretta Fraise, M.D.

## 2017-11-25 ENCOUNTER — Other Ambulatory Visit: Payer: Self-pay | Admitting: Family Medicine

## 2017-11-25 DIAGNOSIS — Z89611 Acquired absence of right leg above knee: Secondary | ICD-10-CM | POA: Diagnosis not present

## 2017-11-25 DIAGNOSIS — M79609 Pain in unspecified limb: Secondary | ICD-10-CM | POA: Diagnosis not present

## 2017-11-25 DIAGNOSIS — T8789 Other complications of amputation stump: Secondary | ICD-10-CM | POA: Diagnosis not present

## 2017-11-25 DIAGNOSIS — M79604 Pain in right leg: Secondary | ICD-10-CM | POA: Diagnosis not present

## 2017-11-26 NOTE — Telephone Encounter (Signed)
Last seen 11/18/17

## 2017-11-30 ENCOUNTER — Telehealth: Payer: Self-pay | Admitting: Family Medicine

## 2017-11-30 NOTE — Telephone Encounter (Signed)
Order written and placed on provider desk for signature

## 2017-12-01 NOTE — Telephone Encounter (Signed)
Faxed to [employer]

## 2017-12-02 ENCOUNTER — Encounter: Payer: Self-pay | Admitting: Family Medicine

## 2017-12-02 ENCOUNTER — Ambulatory Visit (INDEPENDENT_AMBULATORY_CARE_PROVIDER_SITE_OTHER): Payer: Medicare Other | Admitting: Family Medicine

## 2017-12-02 VITALS — BP 117/76 | HR 105 | Temp 99.4°F | Ht 70.0 in | Wt 215.0 lb

## 2017-12-02 DIAGNOSIS — Z89611 Acquired absence of right leg above knee: Secondary | ICD-10-CM

## 2017-12-02 DIAGNOSIS — J329 Chronic sinusitis, unspecified: Secondary | ICD-10-CM | POA: Diagnosis not present

## 2017-12-02 DIAGNOSIS — F11282 Opioid dependence with opioid-induced sleep disorder: Secondary | ICD-10-CM | POA: Diagnosis not present

## 2017-12-02 DIAGNOSIS — J40 Bronchitis, not specified as acute or chronic: Secondary | ICD-10-CM

## 2017-12-02 MED ORDER — BENZONATATE 200 MG PO CAPS: 200.0000 mg | ORAL_CAPSULE | Freq: Three times a day (TID) | ORAL | 0 refills | Status: DC | PRN

## 2017-12-02 MED ORDER — AZITHROMYCIN 250 MG PO TABS: ORAL_TABLET | ORAL | 0 refills | Status: DC

## 2017-12-02 MED ORDER — ONDANSETRON 4 MG PO TBDP: ORAL_TABLET | ORAL | 1 refills | Status: DC

## 2017-12-02 NOTE — Progress Notes (Signed)
Subjective:  Patient ID: Jason [name], male    DOB: 1966-01-26  Age: 52 y.o. MRN: [id number]  CC: Cough (pt here today c/o cough and also needs hand written RX for new Prosthesis)   HPI Jason [name] presents for cough congestion and fever for one week. Worsening. Nausea as well. No vomiting or diarrhea. Denies dyspnea. Pt. Trying to lose weight, but his  RLE prosthesis got too loose after 10 lb. Now trying to get supplies for the prosthesis so it will fit better. Following low carb diet except drinking sodas in the AM. Asks about phentermine.  Depression screen [facility] 2/9 11/18/2017 06/26/2017 05/18/2017  Decreased Interest 1 0 0  Down, Depressed, Hopeless 0 1 0  PHQ - 2 Score 1 1 0    History Jason [name] has a past medical history of Allergy, Anxiety, Arthritis, Bronchitis, Chest pain (2/77/4128), Complication of anesthesia, DJD (degenerative joint disease) (11/29/2011), Hypertension, Neuromuscular disorder ([place]), PONV (postoperative nausea and vomiting), Septic arthritis of knee, right ([place]) (11/29/2011), and Spinal headache.   He has a past surgical history that includes Knee surgery; Shoulder surgery; Ulnar nerve repair; Appendectomy; Cholecystectomy; Shoulder arthroscopy (07/29/2011); Knee arthroscopy (10/24/2011); Irrigation and debridement knee (11/26/2011); LEFT HEART CATH AND CORONARY ANGIOGRAPHY (N/A, 07/14/2016); and Above knee leg amputaton (Right, 05/26/2014).   His family history includes Arrhythmia in his sister; COPD in his father; Diabetes in his father; Drug abuse in his brother; Heart attack in his father and sister; Hypertension in his father and mother.He reports that he has never smoked. He has never used smokeless tobacco. He reports that he does not drink alcohol or use drugs.    ROS Review of Systems  Constitutional: Negative for activity change, appetite change, chills and fever.  HENT: Positive for congestion, postnasal drip, rhinorrhea and sinus pressure. Negative for ear  discharge, ear pain, hearing loss, nosebleeds, sneezing and trouble swallowing.   Respiratory: Negative for chest tightness and shortness of breath.   Cardiovascular: Negative for chest pain and palpitations.  Musculoskeletal: Positive for arthralgias and myalgias (at right leg stump).  Skin: Negative for rash.    Objective:  BP 117/76   Pulse (!) 105   Temp 99.4 F (37.4 C) (Oral)   Ht 5\' 10"  (1.778 m)   Wt 215 lb (97.5 kg)   BMI 30.85 kg/m   BP Readings from Last 3 Encounters:  12/02/17 117/76  11/18/17 132/90  08/14/17 126/80    Wt Readings from Last 3 Encounters:  12/02/17 215 lb (97.5 kg)  11/18/17 216 lb (98 kg)  08/14/17 216 lb (98 kg)     Physical Exam  Constitutional: He is oriented to person, place, and time. He appears well-developed and well-nourished.  HENT:  Head: Normocephalic and atraumatic.  Right Ear: External ear normal.  Left Ear: External ear normal.  Nose: Right sinus exhibits maxillary sinus tenderness.  Mouth/Throat: Oropharyngeal exudate present. No posterior oropharyngeal erythema.  Eyes: Pupils are equal, round, and reactive to light.  Neck: Normal range of motion. Neck supple.  Cardiovascular: Normal rate and regular rhythm.  No murmur heard. Pulmonary/Chest: Breath sounds normal. No respiratory distress.  Musculoskeletal: Normal range of motion. He exhibits tenderness (at distal stump, RLE, above knee amputation site. No erythema or edema. Skin intact. ).  Neurological: He is alert and oriented to person, place, and time.  Vitals reviewed.     Assessment & Plan:   Jason [name] was seen today for cough.  Diagnoses and all orders for this visit:  Sinobronchitis  S/P AKA (above knee amputation) unilateral, right (HCC)  Opioid dependence with opioid-induced sleep disorder ([place])  Other orders -     azithromycin (ZITHROMAX Z-PAK) 250 MG tablet; Take two right away Then one a day for the next 4 days. -     benzonatate (TESSALON) 200 MG  capsule; Take 1 capsule (200 mg total) by mouth 3 (three) times daily as needed for cough. -     ondansetron (ZOFRAN-ODT) 4 MG disintegrating tablet; DISSOLVE (1) TABLET BY MOUTH EVERY 8 HOURS AS NEEDED FOR UP TO 7 DAYS.       I have discontinued Jason [name]. [name]'s gabapentin and XTAMPZA ER. I am also having him start on azithromycin and benzonatate. Additionally, I am having him maintain his EPINEPHrine, acetaminophen, oxyCODONE, pantoprazole, ALPRAZolam, zolpidem, zolpidem, morphine, and ondansetron.  Allergies as of 12/02/2017      Reactions   Iodine Rash   Reports localized reaction at IV site. Able to tolerate with Benadryl.    Ivp Dye [iodinated Diagnostic Agents] Anaphylaxis   Can be pre-treated with Benadryl   Metrizamide Anaphylaxis   Other Other (See Comments)   Under no circumstances will the patient agree to a PICC line   Reglan [metoclopramide] Anaphylaxis   Shellfish-derived Products Anaphylaxis   Toradol [ketorolac Tromethamine] Itching   Patient having systemic itching after receiving IV dose   Lidocaine Other (See Comments), Rash   Pt not sure if this is an actual allergy - might have been a one time incident   Morphine Swelling, Rash   Local reaction to IV being pushed too fast   Vancomycin Rash   Has had vancomycin since this reaction and had no reaction at all   Codeine Hives   Methadone Hives   Propoxyphene Hives   Amoxicillin Rash   Patient denies true reaction   Penicillins Rash   Has patient had a PCN reaction causing immediate rash, facial/tongue/throat swelling, SOB or lightheadedness with hypotension: Yes Has patient had a PCN reaction causing severe rash involving mucus membranes or skin necrosis: No Has patient had a PCN reaction that required hospitalization No Has patient had a PCN reaction occurring within the last 10 years: No If all of the above answers are "NO", then may proceed with Cephalosporin use.   Tape Rash      Medication List         Accurate as of 12/02/17 11:59 PM. Always use your most recent med list.          acetaminophen 325 MG tablet Commonly known as:  TYLENOL Take 325-650 mg by mouth every 6 (six) hours as needed (for pain).   ALPRAZolam 1 MG tablet Commonly known as:  XANAX TAKE (1) TABLET BY MOUTH THREE TIMES DAILY AS NEEDED FOR ANXIETY OR AGITATION.   azithromycin 250 MG tablet Commonly known as:  ZITHROMAX Take two right away Then one a day for the next 4 days.   benzonatate 200 MG capsule Commonly known as:  TESSALON Take 1 capsule (200 mg total) by mouth 3 (three) times daily as needed for cough.   EPINEPHrine 0.3 mg/0.3 mL Soaj injection Commonly known as:  EPI-PEN Inject 0.3 mg into the muscle once as needed for anaphylaxis.   morphine 15 MG 12 hr tablet Commonly known as:  MS CONTIN   ondansetron 4 MG disintegrating tablet Commonly known as:  ZOFRAN-ODT DISSOLVE (1) TABLET BY MOUTH EVERY 8 HOURS AS NEEDED FOR UP TO 7 DAYS.   oxyCODONE 15 MG  immediate release tablet Commonly known as:  ROXICODONE Take 1 tablet (15 mg total) by mouth every 6 (six) hours as needed for pain.   pantoprazole 40 MG tablet Commonly known as:  PROTONIX Take 1 tablet (40 mg total) by mouth 2 (two) times daily.   zolpidem 10 MG tablet Commonly known as:  AMBIEN Take 1 tablet (10 mg total) by mouth at bedtime as needed for sleep.   zolpidem 12.5 MG CR tablet Commonly known as:  AMBIEN CR Take 1 by mouth QHS for early awakening.      Hand written scrip for prosthetic supplies was given to the patient.   Follow-up: Return in about 3 months (around 03/03/2018), or if symptoms worsen or fail to improve.  Claretta Fraise, M.D.

## 2017-12-07 DIAGNOSIS — M79609 Pain in unspecified limb: Secondary | ICD-10-CM | POA: Diagnosis not present

## 2017-12-07 DIAGNOSIS — R079 Chest pain, unspecified: Secondary | ICD-10-CM | POA: Diagnosis not present

## 2017-12-07 DIAGNOSIS — Z4789 Encounter for other orthopedic aftercare: Secondary | ICD-10-CM | POA: Diagnosis not present

## 2017-12-07 DIAGNOSIS — R0789 Other chest pain: Secondary | ICD-10-CM | POA: Diagnosis not present

## 2017-12-07 DIAGNOSIS — Z89611 Acquired absence of right leg above knee: Secondary | ICD-10-CM | POA: Diagnosis not present

## 2017-12-07 DIAGNOSIS — R61 Generalized hyperhidrosis: Secondary | ICD-10-CM | POA: Diagnosis not present

## 2017-12-07 DIAGNOSIS — T8789 Other complications of amputation stump: Secondary | ICD-10-CM | POA: Diagnosis not present

## 2017-12-07 DIAGNOSIS — R0602 Shortness of breath: Secondary | ICD-10-CM | POA: Diagnosis not present

## 2017-12-07 DIAGNOSIS — R110 Nausea: Secondary | ICD-10-CM | POA: Diagnosis not present

## 2017-12-11 DIAGNOSIS — T8789 Other complications of amputation stump: Secondary | ICD-10-CM | POA: Diagnosis not present

## 2017-12-11 DIAGNOSIS — G546 Phantom limb syndrome with pain: Secondary | ICD-10-CM | POA: Diagnosis not present

## 2017-12-11 DIAGNOSIS — Z79899 Other long term (current) drug therapy: Secondary | ICD-10-CM | POA: Diagnosis not present

## 2017-12-11 DIAGNOSIS — M79606 Pain in leg, unspecified: Secondary | ICD-10-CM | POA: Diagnosis not present

## 2017-12-11 DIAGNOSIS — Z6832 Body mass index (BMI) 32.0-32.9, adult: Secondary | ICD-10-CM | POA: Diagnosis not present

## 2017-12-14 DIAGNOSIS — M25561 Pain in right knee: Secondary | ICD-10-CM | POA: Diagnosis not present

## 2017-12-14 DIAGNOSIS — S83191A Other subluxation of right knee, initial encounter: Secondary | ICD-10-CM | POA: Diagnosis not present

## 2017-12-14 DIAGNOSIS — Z9989 Dependence on other enabling machines and devices: Secondary | ICD-10-CM | POA: Diagnosis not present

## 2017-12-14 DIAGNOSIS — Z89611 Acquired absence of right leg above knee: Secondary | ICD-10-CM | POA: Diagnosis not present

## 2017-12-14 DIAGNOSIS — T8789 Other complications of amputation stump: Secondary | ICD-10-CM | POA: Diagnosis not present

## 2017-12-14 DIAGNOSIS — I10 Essential (primary) hypertension: Secondary | ICD-10-CM | POA: Diagnosis not present

## 2017-12-14 DIAGNOSIS — G8929 Other chronic pain: Secondary | ICD-10-CM | POA: Diagnosis not present

## 2017-12-14 DIAGNOSIS — Z01818 Encounter for other preprocedural examination: Secondary | ICD-10-CM | POA: Diagnosis not present

## 2017-12-14 DIAGNOSIS — F419 Anxiety disorder, unspecified: Secondary | ICD-10-CM | POA: Diagnosis not present

## 2017-12-14 DIAGNOSIS — X58XXXA Exposure to other specified factors, initial encounter: Secondary | ICD-10-CM | POA: Diagnosis not present

## 2017-12-17 DIAGNOSIS — Z89611 Acquired absence of right leg above knee: Secondary | ICD-10-CM | POA: Diagnosis not present

## 2017-12-17 DIAGNOSIS — G8929 Other chronic pain: Secondary | ICD-10-CM | POA: Diagnosis not present

## 2017-12-17 DIAGNOSIS — Z79899 Other long term (current) drug therapy: Secondary | ICD-10-CM | POA: Diagnosis not present

## 2017-12-17 DIAGNOSIS — T8789 Other complications of amputation stump: Secondary | ICD-10-CM | POA: Diagnosis not present

## 2017-12-17 DIAGNOSIS — G8918 Other acute postprocedural pain: Secondary | ICD-10-CM | POA: Diagnosis not present

## 2017-12-17 DIAGNOSIS — I10 Essential (primary) hypertension: Secondary | ICD-10-CM | POA: Diagnosis not present

## 2017-12-17 DIAGNOSIS — F419 Anxiety disorder, unspecified: Secondary | ICD-10-CM | POA: Diagnosis not present

## 2017-12-17 DIAGNOSIS — R278 Other lack of coordination: Secondary | ICD-10-CM | POA: Diagnosis not present

## 2017-12-17 DIAGNOSIS — Z23 Encounter for immunization: Secondary | ICD-10-CM | POA: Diagnosis not present

## 2017-12-17 DIAGNOSIS — M79609 Pain in unspecified limb: Secondary | ICD-10-CM | POA: Diagnosis not present

## 2017-12-18 DIAGNOSIS — G8918 Other acute postprocedural pain: Secondary | ICD-10-CM | POA: Diagnosis not present

## 2017-12-18 DIAGNOSIS — Z79899 Other long term (current) drug therapy: Secondary | ICD-10-CM | POA: Diagnosis not present

## 2017-12-18 DIAGNOSIS — F419 Anxiety disorder, unspecified: Secondary | ICD-10-CM | POA: Diagnosis not present

## 2017-12-18 DIAGNOSIS — I10 Essential (primary) hypertension: Secondary | ICD-10-CM | POA: Diagnosis not present

## 2017-12-18 DIAGNOSIS — G8929 Other chronic pain: Secondary | ICD-10-CM | POA: Diagnosis not present

## 2017-12-18 DIAGNOSIS — T8789 Other complications of amputation stump: Secondary | ICD-10-CM | POA: Diagnosis not present

## 2017-12-19 DIAGNOSIS — Z79899 Other long term (current) drug therapy: Secondary | ICD-10-CM | POA: Diagnosis not present

## 2017-12-19 DIAGNOSIS — F419 Anxiety disorder, unspecified: Secondary | ICD-10-CM | POA: Diagnosis not present

## 2017-12-19 DIAGNOSIS — T8789 Other complications of amputation stump: Secondary | ICD-10-CM | POA: Diagnosis not present

## 2017-12-19 DIAGNOSIS — G8929 Other chronic pain: Secondary | ICD-10-CM | POA: Diagnosis not present

## 2017-12-19 DIAGNOSIS — G8918 Other acute postprocedural pain: Secondary | ICD-10-CM | POA: Diagnosis not present

## 2017-12-19 DIAGNOSIS — I10 Essential (primary) hypertension: Secondary | ICD-10-CM | POA: Diagnosis not present

## 2017-12-22 DIAGNOSIS — R000 Tachycardia, unspecified: Secondary | ICD-10-CM | POA: Diagnosis not present

## 2017-12-22 DIAGNOSIS — R079 Chest pain, unspecified: Secondary | ICD-10-CM | POA: Diagnosis not present

## 2017-12-22 DIAGNOSIS — M79603 Pain in arm, unspecified: Secondary | ICD-10-CM | POA: Diagnosis not present

## 2017-12-22 DIAGNOSIS — Z049 Encounter for examination and observation for unspecified reason: Secondary | ICD-10-CM | POA: Diagnosis not present

## 2017-12-23 DIAGNOSIS — Z885 Allergy status to narcotic agent status: Secondary | ICD-10-CM | POA: Diagnosis not present

## 2017-12-23 DIAGNOSIS — Z91041 Radiographic dye allergy status: Secondary | ICD-10-CM | POA: Diagnosis not present

## 2017-12-23 DIAGNOSIS — K224 Dyskinesia of esophagus: Secondary | ICD-10-CM | POA: Diagnosis not present

## 2017-12-23 DIAGNOSIS — R0789 Other chest pain: Secondary | ICD-10-CM | POA: Diagnosis not present

## 2017-12-23 DIAGNOSIS — I10 Essential (primary) hypertension: Secondary | ICD-10-CM | POA: Diagnosis not present

## 2017-12-23 DIAGNOSIS — Z9109 Other allergy status, other than to drugs and biological substances: Secondary | ICD-10-CM | POA: Diagnosis not present

## 2017-12-23 DIAGNOSIS — R918 Other nonspecific abnormal finding of lung field: Secondary | ICD-10-CM | POA: Diagnosis not present

## 2017-12-23 DIAGNOSIS — Z89511 Acquired absence of right leg below knee: Secondary | ICD-10-CM | POA: Diagnosis not present

## 2017-12-23 DIAGNOSIS — R079 Chest pain, unspecified: Secondary | ICD-10-CM | POA: Diagnosis not present

## 2017-12-23 DIAGNOSIS — Z888 Allergy status to other drugs, medicaments and biological substances status: Secondary | ICD-10-CM | POA: Diagnosis not present

## 2017-12-23 DIAGNOSIS — R0600 Dyspnea, unspecified: Secondary | ICD-10-CM | POA: Diagnosis not present

## 2017-12-23 DIAGNOSIS — Z79899 Other long term (current) drug therapy: Secondary | ICD-10-CM | POA: Diagnosis not present

## 2017-12-23 DIAGNOSIS — Z87892 Personal history of anaphylaxis: Secondary | ICD-10-CM | POA: Diagnosis not present

## 2017-12-23 DIAGNOSIS — Z880 Allergy status to penicillin: Secondary | ICD-10-CM | POA: Diagnosis not present

## 2017-12-25 ENCOUNTER — Ambulatory Visit (INDEPENDENT_AMBULATORY_CARE_PROVIDER_SITE_OTHER): Payer: Medicare Other | Admitting: Family Medicine

## 2017-12-25 ENCOUNTER — Encounter: Payer: Self-pay | Admitting: Family Medicine

## 2017-12-25 VITALS — BP 136/94 | HR 100 | Temp 98.8°F | Ht 70.0 in | Wt 225.0 lb

## 2017-12-25 DIAGNOSIS — F5101 Primary insomnia: Secondary | ICD-10-CM | POA: Diagnosis not present

## 2017-12-25 DIAGNOSIS — F11282 Opioid dependence with opioid-induced sleep disorder: Secondary | ICD-10-CM | POA: Diagnosis not present

## 2017-12-25 DIAGNOSIS — F1320 Sedative, hypnotic or anxiolytic dependence, uncomplicated: Secondary | ICD-10-CM

## 2017-12-25 DIAGNOSIS — F411 Generalized anxiety disorder: Secondary | ICD-10-CM

## 2017-12-25 DIAGNOSIS — Z89611 Acquired absence of right leg above knee: Secondary | ICD-10-CM | POA: Diagnosis not present

## 2017-12-25 MED ORDER — ZOLPIDEM TARTRATE ER 12.5 MG PO TBCR: 25.0000 mg | EXTENDED_RELEASE_TABLET | Freq: Every day | ORAL | 2 refills | Status: DC

## 2017-12-25 MED ORDER — ALPRAZOLAM 1 MG PO TABS: ORAL_TABLET | ORAL | 2 refills | Status: DC

## 2017-12-25 MED ORDER — ONDANSETRON 4 MG PO TBDP: ORAL_TABLET | ORAL | 1 refills | Status: DC

## 2017-12-25 NOTE — Progress Notes (Signed)
Subjective:  Patient ID: Jason [name], male    DOB: 08/31/1965  Age: 52 y.o. MRN: [id number]  CC: Medical Management of Chronic Issues and Anxiety (refill xanax)   HPI Jason [name] presents for refills of his Xanax.  He has chronic anxiety stemming from his amputation and phantom pain.  He is been trying to walk with a prosthesis and unfortunately he had to have a revision of his stump a few days ago.  The stitches are still in place in fact.  The reason for that apparently is that the muscle was not staying in place over the bone.  He is hoping now that once this heals he can get back on his feet and walk with a new prosthesis fitted to the revised stump.  He also has chronic problems with sleep.  He has been maintained on a very high dose of Ambien.  This was confirmed through pharmacy and the PMP aware program.  Today he would like to switch from a combination of immediate dose plus extended release Ambien to using simply to extended release Ambien.  He does have a supply of the immediate release at home and will have to continue current regimen until he exhausted that supply of Ambien.  Depression screen [facility] 2/9 12/25/2017 11/18/2017 06/26/2017  Decreased Interest 0 1 0  Down, Depressed, Hopeless 1 0 1  PHQ - 2 Score 1 1 1     History Tel has a past medical history of Allergy, Anxiety, Arthritis, Bronchitis, Chest pain (2/70/3500), Complication of anesthesia, DJD (degenerative joint disease) (11/29/2011), Hypertension, Neuromuscular disorder ([place]), PONV (postoperative nausea and vomiting), Septic arthritis of knee, right ([place]) (11/29/2011), and Spinal headache.   He has a past surgical history that includes Knee surgery; Shoulder surgery; Ulnar nerve repair; Appendectomy; Cholecystectomy; Shoulder arthroscopy (07/29/2011); Knee arthroscopy (10/24/2011); Irrigation and debridement knee (11/26/2011); LEFT HEART CATH AND CORONARY ANGIOGRAPHY (N/A, 07/14/2016); and Above knee leg amputaton (Right,  05/26/2014).   His family history includes Arrhythmia in his sister; COPD in his father; Diabetes in his father; Drug abuse in his brother; Heart attack in his father and sister; Hypertension in his father and mother.He reports that he has never smoked. He has never used smokeless tobacco. He reports that he does not drink alcohol or use drugs.    ROS Review of Systems  Constitutional: Negative for fever.  Respiratory: Negative for shortness of breath.   Cardiovascular: Negative for chest pain.  Musculoskeletal: Positive for arthralgias and myalgias.  Skin: Negative for rash.  Psychiatric/Behavioral: Positive for sleep disturbance. Negative for self-injury and suicidal ideas. The patient is nervous/anxious.     Objective:  BP (!) 136/94   Pulse 100   Temp 98.8 F (37.1 C) (Oral)   Ht 5\' 10"  (1.778 m)   Wt 225 lb (102.1 kg)   BMI 32.28 kg/m   BP Readings from Last 3 Encounters:  12/25/17 (!) 136/94  12/02/17 117/76  11/18/17 132/90    Wt Readings from Last 3 Encounters:  12/25/17 225 lb (102.1 kg)  12/02/17 215 lb (97.5 kg)  11/18/17 216 lb (98 kg)     Physical Exam  Constitutional: He is oriented to person, place, and time. He appears well-developed and well-nourished. No distress.  HENT:  Head: Normocephalic and atraumatic.  Right Ear: External ear normal.  Left Ear: External ear normal.  Nose: Nose normal.  Mouth/Throat: Oropharynx is clear and moist. No oropharyngeal exudate or posterior oropharyngeal erythema.  Eyes: Pupils are equal, round, and  reactive to light. Conjunctivae and EOM are normal.  Neck: Normal range of motion. Neck supple.  Cardiovascular: Normal rate, regular rhythm and normal heart sounds.  No murmur heard. Pulmonary/Chest: Effort normal and breath sounds normal. No respiratory distress. He has no wheezes. He has no rales.  Abdominal: Soft. There is no tenderness.  Musculoskeletal: Normal range of motion. He exhibits deformity (Patient has  right AKA with fresh incisional scar and sutures in place).  Suture line intact at the right AKA stump.  Surgery performed about a week ago.  No signs of infection including redness swelling and purulent drainage  Neurological: He is alert and oriented to person, place, and time. He has normal reflexes.  Skin: Skin is warm and dry.  Psychiatric: He has a normal mood and affect. His behavior is normal. Judgment and thought content normal.  Vitals reviewed.     Assessment & Plan:   Jason [name] was seen today for medical management of chronic issues and anxiety.  Diagnoses and all orders for this visit:  Primary insomnia -     ALPRAZolam (XANAX) 1 MG tablet; TAKE (1) TABLET BY MOUTH THREE TIMES DAILY AS NEEDED FOR ANXIETY OR AGITATION.  Opioid dependence with opioid-induced sleep disorder (HCC)  S/P AKA (above knee amputation) unilateral, right (HCC)  Benzodiazepine dependence, continuous (HCC)  GAD (generalized anxiety disorder)  Other orders -     ondansetron (ZOFRAN-ODT) 4 MG disintegrating tablet; DISSOLVE (1) TABLET BY MOUTH EVERY 8 HOURS AS NEEDED FOR UP TO 7 DAYS. -     zolpidem (AMBIEN CR) 12.5 MG CR tablet; Take 2 tablets (25 mg total) by mouth at bedtime. Take 1 by mouth QHS for early awakening.       I have discontinued Jason [name]. Jason [name]'s zolpidem. I have also changed his zolpidem. Additionally, I am having him maintain his EPINEPHrine, acetaminophen, oxyCODONE, pantoprazole, morphine, azithromycin, benzonatate, HYDROmorphone, ALPRAZolam, and ondansetron.  Allergies as of 12/25/2017      Reactions   Iodine Rash   Reports localized reaction at IV site. Able to tolerate with Benadryl.    Ivp Dye [iodinated Diagnostic Agents] Anaphylaxis   Can be pre-treated with Benadryl   Metrizamide Anaphylaxis   Other Other (See Comments)   Under no circumstances will the patient agree to a PICC line   Reglan [metoclopramide] Anaphylaxis   Shellfish-derived Products Anaphylaxis    Toradol [ketorolac Tromethamine] Itching   Patient having systemic itching after receiving IV dose   Lidocaine Other (See Comments), Rash   Pt not sure if this is an actual allergy - might have been a one time incident   Morphine Swelling, Rash   Local reaction to IV being pushed too fast   Vancomycin Rash   Has had vancomycin since this reaction and had no reaction at all   Codeine Hives   Methadone Hives   Propoxyphene Hives   Amoxicillin Rash   Patient denies true reaction   Penicillins Rash   Has patient had a PCN reaction causing immediate rash, facial/tongue/throat swelling, SOB or lightheadedness with hypotension: Yes Has patient had a PCN reaction causing severe rash involving mucus membranes or skin necrosis: No Has patient had a PCN reaction that required hospitalization No Has patient had a PCN reaction occurring within the last 10 years: No If all of the above answers are "NO", then may proceed with Cephalosporin use.   Tape Rash      Medication List        Accurate as of 12/25/17  2:16 PM. Always use your most recent med list.          acetaminophen 325 MG tablet Commonly known as:  TYLENOL Take 325-650 mg by mouth every 6 (six) hours as needed (for pain).   ALPRAZolam 1 MG tablet Commonly known as:  XANAX TAKE (1) TABLET BY MOUTH THREE TIMES DAILY AS NEEDED FOR ANXIETY OR AGITATION.   azithromycin 250 MG tablet Commonly known as:  ZITHROMAX Take two right away Then one a day for the next 4 days.   benzonatate 200 MG capsule Commonly known as:  TESSALON Take 1 capsule (200 mg total) by mouth 3 (three) times daily as needed for cough.   EPINEPHrine 0.3 mg/0.3 mL Soaj injection Commonly known as:  EPI-PEN Inject 0.3 mg into the muscle once as needed for anaphylaxis.   HYDROmorphone 4 MG tablet Commonly known as:  DILAUDID Take 4 mg by mouth 4 (four) times daily as needed.   morphine 15 MG 12 hr tablet Commonly known as:  MS CONTIN   ondansetron 4 MG  disintegrating tablet Commonly known as:  ZOFRAN-ODT DISSOLVE (1) TABLET BY MOUTH EVERY 8 HOURS AS NEEDED FOR UP TO 7 DAYS.   oxyCODONE 15 MG immediate release tablet Commonly known as:  ROXICODONE Take 1 tablet (15 mg total) by mouth every 6 (six) hours as needed for pain.   pantoprazole 40 MG tablet Commonly known as:  PROTONIX Take 1 tablet (40 mg total) by mouth 2 (two) times daily.   zolpidem 12.5 MG CR tablet Commonly known as:  AMBIEN CR Take 2 tablets (25 mg total) by mouth at bedtime. Take 1 by mouth QHS for early awakening.        Follow-up: Return in about 3 months (around 03/27/2018).  Claretta Fraise, M.D.

## 2017-12-29 DIAGNOSIS — R509 Fever, unspecified: Secondary | ICD-10-CM | POA: Diagnosis not present

## 2017-12-29 DIAGNOSIS — M79604 Pain in right leg: Secondary | ICD-10-CM | POA: Diagnosis not present

## 2017-12-29 DIAGNOSIS — Z049 Encounter for examination and observation for unspecified reason: Secondary | ICD-10-CM | POA: Diagnosis not present

## 2017-12-29 DIAGNOSIS — R000 Tachycardia, unspecified: Secondary | ICD-10-CM | POA: Diagnosis not present

## 2017-12-30 DIAGNOSIS — T8140XA Infection following a procedure, unspecified, initial encounter: Secondary | ICD-10-CM | POA: Diagnosis not present

## 2017-12-30 DIAGNOSIS — M79604 Pain in right leg: Secondary | ICD-10-CM | POA: Diagnosis not present

## 2017-12-30 DIAGNOSIS — T8743 Infection of amputation stump, right lower extremity: Secondary | ICD-10-CM | POA: Diagnosis not present

## 2017-12-30 DIAGNOSIS — I10 Essential (primary) hypertension: Secondary | ICD-10-CM | POA: Diagnosis not present

## 2017-12-30 DIAGNOSIS — Z89611 Acquired absence of right leg above knee: Secondary | ICD-10-CM | POA: Diagnosis not present

## 2017-12-30 DIAGNOSIS — Y838 Other surgical procedures as the cause of abnormal reaction of the patient, or of later complication, without mention of misadventure at the time of the procedure: Secondary | ICD-10-CM | POA: Diagnosis not present

## 2017-12-30 DIAGNOSIS — T8789 Other complications of amputation stump: Secondary | ICD-10-CM | POA: Diagnosis not present

## 2017-12-30 DIAGNOSIS — R000 Tachycardia, unspecified: Secondary | ICD-10-CM | POA: Diagnosis not present

## 2017-12-30 DIAGNOSIS — G8929 Other chronic pain: Secondary | ICD-10-CM | POA: Diagnosis not present

## 2017-12-30 DIAGNOSIS — Z049 Encounter for examination and observation for unspecified reason: Secondary | ICD-10-CM | POA: Diagnosis not present

## 2017-12-30 DIAGNOSIS — M7989 Other specified soft tissue disorders: Secondary | ICD-10-CM | POA: Diagnosis not present

## 2017-12-30 DIAGNOSIS — L03115 Cellulitis of right lower limb: Secondary | ICD-10-CM | POA: Diagnosis not present

## 2017-12-30 DIAGNOSIS — R509 Fever, unspecified: Secondary | ICD-10-CM | POA: Diagnosis not present

## 2017-12-30 DIAGNOSIS — Z79899 Other long term (current) drug therapy: Secondary | ICD-10-CM | POA: Diagnosis not present

## 2017-12-30 DIAGNOSIS — A419 Sepsis, unspecified organism: Secondary | ICD-10-CM | POA: Diagnosis not present

## 2017-12-31 DIAGNOSIS — M79604 Pain in right leg: Secondary | ICD-10-CM | POA: Diagnosis not present

## 2017-12-31 DIAGNOSIS — I10 Essential (primary) hypertension: Secondary | ICD-10-CM | POA: Diagnosis not present

## 2017-12-31 DIAGNOSIS — L03115 Cellulitis of right lower limb: Secondary | ICD-10-CM | POA: Diagnosis not present

## 2017-12-31 DIAGNOSIS — Z79899 Other long term (current) drug therapy: Secondary | ICD-10-CM | POA: Diagnosis not present

## 2017-12-31 DIAGNOSIS — G8929 Other chronic pain: Secondary | ICD-10-CM | POA: Diagnosis not present

## 2017-12-31 DIAGNOSIS — T8743 Infection of amputation stump, right lower extremity: Secondary | ICD-10-CM | POA: Diagnosis not present

## 2017-12-31 DIAGNOSIS — T8789 Other complications of amputation stump: Secondary | ICD-10-CM | POA: Diagnosis not present

## 2017-12-31 DIAGNOSIS — R079 Chest pain, unspecified: Secondary | ICD-10-CM | POA: Diagnosis not present

## 2018-01-01 DIAGNOSIS — S81801A Unspecified open wound, right lower leg, initial encounter: Secondary | ICD-10-CM | POA: Diagnosis not present

## 2018-01-01 DIAGNOSIS — G8918 Other acute postprocedural pain: Secondary | ICD-10-CM | POA: Diagnosis not present

## 2018-01-01 DIAGNOSIS — T8743 Infection of amputation stump, right lower extremity: Secondary | ICD-10-CM | POA: Diagnosis not present

## 2018-01-01 DIAGNOSIS — L03115 Cellulitis of right lower limb: Secondary | ICD-10-CM | POA: Diagnosis not present

## 2018-01-01 DIAGNOSIS — Z79899 Other long term (current) drug therapy: Secondary | ICD-10-CM | POA: Diagnosis not present

## 2018-01-01 DIAGNOSIS — Z79891 Long term (current) use of opiate analgesic: Secondary | ICD-10-CM | POA: Diagnosis not present

## 2018-01-01 DIAGNOSIS — G8929 Other chronic pain: Secondary | ICD-10-CM | POA: Diagnosis not present

## 2018-01-01 DIAGNOSIS — T8131XA Disruption of external operation (surgical) wound, not elsewhere classified, initial encounter: Secondary | ICD-10-CM | POA: Diagnosis not present

## 2018-01-01 DIAGNOSIS — Z89611 Acquired absence of right leg above knee: Secondary | ICD-10-CM | POA: Diagnosis not present

## 2018-01-02 DIAGNOSIS — I2510 Atherosclerotic heart disease of native coronary artery without angina pectoris: Secondary | ICD-10-CM | POA: Diagnosis not present

## 2018-01-02 DIAGNOSIS — Z79891 Long term (current) use of opiate analgesic: Secondary | ICD-10-CM | POA: Diagnosis not present

## 2018-01-02 DIAGNOSIS — G8918 Other acute postprocedural pain: Secondary | ICD-10-CM | POA: Diagnosis not present

## 2018-01-02 DIAGNOSIS — L03115 Cellulitis of right lower limb: Secondary | ICD-10-CM | POA: Diagnosis not present

## 2018-01-02 DIAGNOSIS — G8929 Other chronic pain: Secondary | ICD-10-CM | POA: Diagnosis not present

## 2018-01-02 DIAGNOSIS — Z89611 Acquired absence of right leg above knee: Secondary | ICD-10-CM | POA: Diagnosis not present

## 2018-01-02 DIAGNOSIS — I10 Essential (primary) hypertension: Secondary | ICD-10-CM | POA: Diagnosis not present

## 2018-01-02 DIAGNOSIS — Z79899 Other long term (current) drug therapy: Secondary | ICD-10-CM | POA: Diagnosis not present

## 2018-01-03 DIAGNOSIS — I10 Essential (primary) hypertension: Secondary | ICD-10-CM | POA: Diagnosis not present

## 2018-01-03 DIAGNOSIS — Z89611 Acquired absence of right leg above knee: Secondary | ICD-10-CM | POA: Diagnosis not present

## 2018-01-03 DIAGNOSIS — Z955 Presence of coronary angioplasty implant and graft: Secondary | ICD-10-CM | POA: Diagnosis not present

## 2018-01-03 DIAGNOSIS — I2510 Atherosclerotic heart disease of native coronary artery without angina pectoris: Secondary | ICD-10-CM | POA: Diagnosis not present

## 2018-01-03 DIAGNOSIS — L03115 Cellulitis of right lower limb: Secondary | ICD-10-CM | POA: Diagnosis not present

## 2018-01-07 ENCOUNTER — Telehealth: Payer: Self-pay | Admitting: Family Medicine

## 2018-01-07 DIAGNOSIS — Z6827 Body mass index (BMI) 27.0-27.9, adult: Secondary | ICD-10-CM | POA: Diagnosis not present

## 2018-01-07 DIAGNOSIS — Z79899 Other long term (current) drug therapy: Secondary | ICD-10-CM | POA: Diagnosis not present

## 2018-01-07 DIAGNOSIS — G546 Phantom limb syndrome with pain: Secondary | ICD-10-CM | POA: Diagnosis not present

## 2018-01-07 DIAGNOSIS — T8789 Other complications of amputation stump: Secondary | ICD-10-CM | POA: Diagnosis not present

## 2018-01-07 DIAGNOSIS — M79606 Pain in leg, unspecified: Secondary | ICD-10-CM | POA: Diagnosis not present

## 2018-01-07 NOTE — Telephone Encounter (Signed)
Pt aware, sent message to Dr Livia Snellen to review on Tues 29th

## 2018-01-07 NOTE — Telephone Encounter (Signed)
That something completely different, he will need to discuss this with his PCP and he will have to wait till he is back in the office to discuss that

## 2018-01-07 NOTE — Telephone Encounter (Signed)
Yes he should be able to transition without any issues

## 2018-01-07 NOTE — Telephone Encounter (Signed)
This pt was taking an Ambien 10 and an Ambien CR 12.5 everynight. He is requesting to stop Ambien 10 and just do TWO of the Ambien CR 12.5. He will neeed a new script sent in if this is ok

## 2018-01-07 NOTE — Telephone Encounter (Signed)
Pt wants to know if he can stop taking the ambien 10 mg and start taking the ambien cr 12.5 mg

## 2018-01-08 ENCOUNTER — Telehealth: Payer: Self-pay | Admitting: *Deleted

## 2018-01-08 NOTE — Telephone Encounter (Signed)
Patient states that he only received 13 pills.  Informed patient that 30 day supply was sent to pharmacy and will need to contact pharmacy for refills.

## 2018-01-08 NOTE — Telephone Encounter (Signed)
w

## 2018-01-08 NOTE — Telephone Encounter (Signed)
Spoke with Amy at [facility] and was informed by Dr. Livia Snellen to fill Ambien CR 12.5 to get him through until the Ambien 10mg  ran out which would be 01/15/18.  Patient given enough Ambien CR 12.5 to take with Ambien 10mg .  Patient aware

## 2018-01-19 DIAGNOSIS — Z4802 Encounter for removal of sutures: Secondary | ICD-10-CM | POA: Diagnosis not present

## 2018-01-23 ENCOUNTER — Other Ambulatory Visit: Payer: Self-pay | Admitting: Family Medicine

## 2018-01-23 NOTE — Telephone Encounter (Signed)
Notified patient I cannot authorize him to get prescription early.  He states pharmacy states they can transfer to pharmacy that is open on Sundays so that he can get it 3 days early.  Recommended he have refill transferred if he needs medication before Monday.

## 2018-02-07 DIAGNOSIS — M7989 Other specified soft tissue disorders: Secondary | ICD-10-CM | POA: Diagnosis not present

## 2018-02-07 DIAGNOSIS — M79651 Pain in right thigh: Secondary | ICD-10-CM | POA: Diagnosis not present

## 2018-02-07 DIAGNOSIS — R112 Nausea with vomiting, unspecified: Secondary | ICD-10-CM | POA: Diagnosis not present

## 2018-02-07 DIAGNOSIS — R51 Headache: Secondary | ICD-10-CM | POA: Diagnosis not present

## 2018-02-09 DIAGNOSIS — Z79899 Other long term (current) drug therapy: Secondary | ICD-10-CM | POA: Diagnosis not present

## 2018-02-09 DIAGNOSIS — M79606 Pain in leg, unspecified: Secondary | ICD-10-CM | POA: Diagnosis not present

## 2018-02-09 DIAGNOSIS — T8789 Other complications of amputation stump: Secondary | ICD-10-CM | POA: Diagnosis not present

## 2018-02-09 DIAGNOSIS — Z6827 Body mass index (BMI) 27.0-27.9, adult: Secondary | ICD-10-CM | POA: Diagnosis not present

## 2018-02-09 DIAGNOSIS — G546 Phantom limb syndrome with pain: Secondary | ICD-10-CM | POA: Diagnosis not present

## 2018-02-10 DIAGNOSIS — A419 Sepsis, unspecified organism: Secondary | ICD-10-CM | POA: Diagnosis not present

## 2018-02-10 DIAGNOSIS — T8142XA Infection following a procedure, deep incisional surgical site, initial encounter: Secondary | ICD-10-CM | POA: Diagnosis not present

## 2018-02-10 DIAGNOSIS — T8743 Infection of amputation stump, right lower extremity: Secondary | ICD-10-CM | POA: Diagnosis not present

## 2018-02-10 DIAGNOSIS — R000 Tachycardia, unspecified: Secondary | ICD-10-CM | POA: Diagnosis not present

## 2018-02-10 DIAGNOSIS — L02415 Cutaneous abscess of right lower limb: Secondary | ICD-10-CM | POA: Diagnosis not present

## 2018-02-10 DIAGNOSIS — Z89611 Acquired absence of right leg above knee: Secondary | ICD-10-CM | POA: Diagnosis not present

## 2018-02-10 DIAGNOSIS — D72829 Elevated white blood cell count, unspecified: Secondary | ICD-10-CM | POA: Diagnosis not present

## 2018-02-10 DIAGNOSIS — R509 Fever, unspecified: Secondary | ICD-10-CM | POA: Diagnosis not present

## 2018-02-12 ENCOUNTER — Other Ambulatory Visit: Payer: Self-pay | Admitting: *Deleted

## 2018-02-12 DIAGNOSIS — Z89611 Acquired absence of right leg above knee: Secondary | ICD-10-CM | POA: Diagnosis not present

## 2018-02-12 DIAGNOSIS — T8743 Infection of amputation stump, right lower extremity: Secondary | ICD-10-CM | POA: Diagnosis not present

## 2018-02-12 DIAGNOSIS — G8929 Other chronic pain: Secondary | ICD-10-CM | POA: Diagnosis not present

## 2018-02-12 DIAGNOSIS — L03115 Cellulitis of right lower limb: Secondary | ICD-10-CM | POA: Diagnosis not present

## 2018-02-12 DIAGNOSIS — A419 Sepsis, unspecified organism: Secondary | ICD-10-CM | POA: Diagnosis not present

## 2018-02-12 DIAGNOSIS — Z79899 Other long term (current) drug therapy: Secondary | ICD-10-CM | POA: Diagnosis not present

## 2018-02-12 DIAGNOSIS — Z79891 Long term (current) use of opiate analgesic: Secondary | ICD-10-CM | POA: Diagnosis not present

## 2018-02-12 DIAGNOSIS — Z8659 Personal history of other mental and behavioral disorders: Secondary | ICD-10-CM | POA: Diagnosis not present

## 2018-02-12 DIAGNOSIS — L02415 Cutaneous abscess of right lower limb: Secondary | ICD-10-CM | POA: Diagnosis not present

## 2018-02-13 DIAGNOSIS — Z89611 Acquired absence of right leg above knee: Secondary | ICD-10-CM | POA: Diagnosis not present

## 2018-02-13 DIAGNOSIS — F419 Anxiety disorder, unspecified: Secondary | ICD-10-CM | POA: Diagnosis not present

## 2018-02-13 DIAGNOSIS — R52 Pain, unspecified: Secondary | ICD-10-CM | POA: Diagnosis not present

## 2018-02-13 DIAGNOSIS — Z765 Malingerer [conscious simulation]: Secondary | ICD-10-CM | POA: Diagnosis not present

## 2018-02-13 DIAGNOSIS — I10 Essential (primary) hypertension: Secondary | ICD-10-CM | POA: Diagnosis not present

## 2018-02-13 DIAGNOSIS — L02415 Cutaneous abscess of right lower limb: Secondary | ICD-10-CM | POA: Diagnosis not present

## 2018-02-14 DIAGNOSIS — Z89611 Acquired absence of right leg above knee: Secondary | ICD-10-CM | POA: Diagnosis not present

## 2018-02-14 DIAGNOSIS — I10 Essential (primary) hypertension: Secondary | ICD-10-CM | POA: Diagnosis not present

## 2018-02-14 DIAGNOSIS — R52 Pain, unspecified: Secondary | ICD-10-CM | POA: Diagnosis not present

## 2018-02-14 DIAGNOSIS — F419 Anxiety disorder, unspecified: Secondary | ICD-10-CM | POA: Diagnosis not present

## 2018-02-14 DIAGNOSIS — L02415 Cutaneous abscess of right lower limb: Secondary | ICD-10-CM | POA: Diagnosis not present

## 2018-02-14 DIAGNOSIS — Z765 Malingerer [conscious simulation]: Secondary | ICD-10-CM | POA: Diagnosis not present

## 2018-02-28 DIAGNOSIS — G8911 Acute pain due to trauma: Secondary | ICD-10-CM | POA: Diagnosis not present

## 2018-02-28 DIAGNOSIS — Z881 Allergy status to other antibiotic agents status: Secondary | ICD-10-CM | POA: Diagnosis not present

## 2018-02-28 DIAGNOSIS — R040 Epistaxis: Secondary | ICD-10-CM | POA: Diagnosis not present

## 2018-02-28 DIAGNOSIS — S0083XA Contusion of other part of head, initial encounter: Secondary | ICD-10-CM | POA: Diagnosis not present

## 2018-02-28 DIAGNOSIS — Z049 Encounter for examination and observation for unspecified reason: Secondary | ICD-10-CM | POA: Diagnosis not present

## 2018-02-28 DIAGNOSIS — Z79899 Other long term (current) drug therapy: Secondary | ICD-10-CM | POA: Diagnosis not present

## 2018-02-28 DIAGNOSIS — S199XXA Unspecified injury of neck, initial encounter: Secondary | ICD-10-CM | POA: Diagnosis not present

## 2018-02-28 DIAGNOSIS — Z91041 Radiographic dye allergy status: Secondary | ICD-10-CM | POA: Diagnosis not present

## 2018-02-28 DIAGNOSIS — Z888 Allergy status to other drugs, medicaments and biological substances status: Secondary | ICD-10-CM | POA: Diagnosis not present

## 2018-02-28 DIAGNOSIS — Z885 Allergy status to narcotic agent status: Secondary | ICD-10-CM | POA: Diagnosis not present

## 2018-02-28 DIAGNOSIS — I10 Essential (primary) hypertension: Secondary | ICD-10-CM | POA: Diagnosis not present

## 2018-02-28 DIAGNOSIS — S0990XA Unspecified injury of head, initial encounter: Secondary | ICD-10-CM | POA: Diagnosis not present

## 2018-02-28 DIAGNOSIS — Z880 Allergy status to penicillin: Secondary | ICD-10-CM | POA: Diagnosis not present

## 2018-02-28 DIAGNOSIS — Z91048 Other nonmedicinal substance allergy status: Secondary | ICD-10-CM | POA: Diagnosis not present

## 2018-02-28 DIAGNOSIS — S0993XA Unspecified injury of face, initial encounter: Secondary | ICD-10-CM | POA: Diagnosis not present

## 2018-02-28 DIAGNOSIS — R51 Headache: Secondary | ICD-10-CM | POA: Diagnosis not present

## 2018-02-28 DIAGNOSIS — Y92410 Unspecified street and highway as the place of occurrence of the external cause: Secondary | ICD-10-CM | POA: Diagnosis not present

## 2018-03-03 ENCOUNTER — Telehealth: Payer: Self-pay | Admitting: *Deleted

## 2018-03-03 ENCOUNTER — Emergency Department (HOSPITAL_COMMUNITY): Payer: Medicare Other

## 2018-03-03 ENCOUNTER — Emergency Department (HOSPITAL_COMMUNITY)
Admission: EM | Admit: 2018-03-03 | Discharge: 2018-03-03 | Discharge status: Against Medical Advice | Payer: Medicare Other | Attending: Emergency Medicine | Admitting: Emergency Medicine

## 2018-03-03 ENCOUNTER — Ambulatory Visit: Payer: Medicare Other

## 2018-03-03 DIAGNOSIS — I10 Essential (primary) hypertension: Secondary | ICD-10-CM | POA: Diagnosis not present

## 2018-03-03 DIAGNOSIS — S0990XA Unspecified injury of head, initial encounter: Secondary | ICD-10-CM | POA: Diagnosis not present

## 2018-03-03 DIAGNOSIS — Z79899 Other long term (current) drug therapy: Secondary | ICD-10-CM | POA: Diagnosis not present

## 2018-03-03 DIAGNOSIS — R404 Transient alteration of awareness: Secondary | ICD-10-CM | POA: Diagnosis not present

## 2018-03-03 DIAGNOSIS — R410 Disorientation, unspecified: Secondary | ICD-10-CM | POA: Diagnosis not present

## 2018-03-03 DIAGNOSIS — R531 Weakness: Secondary | ICD-10-CM

## 2018-03-03 DIAGNOSIS — R51 Headache: Secondary | ICD-10-CM | POA: Diagnosis not present

## 2018-03-03 DIAGNOSIS — R0902 Hypoxemia: Secondary | ICD-10-CM | POA: Diagnosis not present

## 2018-03-03 DIAGNOSIS — R040 Epistaxis: Secondary | ICD-10-CM | POA: Insufficient documentation

## 2018-03-03 DIAGNOSIS — R58 Hemorrhage, not elsewhere classified: Secondary | ICD-10-CM | POA: Diagnosis not present

## 2018-03-03 DIAGNOSIS — R000 Tachycardia, unspecified: Secondary | ICD-10-CM | POA: Diagnosis not present

## 2018-03-03 LAB — RAPID URINE DRUG SCREEN, HOSP PERFORMED
Amphetamines: NOT DETECTED
Barbiturates: NOT DETECTED
Benzodiazepines: POSITIVE — AB
Cocaine: NOT DETECTED
Opiates: POSITIVE — AB
Tetrahydrocannabinol: NOT DETECTED

## 2018-03-03 LAB — DIFFERENTIAL
Abs Immature Granulocytes: 0.01 10*3/uL (ref 0.00–0.07)
Basophils Absolute: 0 10*3/uL (ref 0.0–0.1)
Basophils Relative: 1 %
Eosinophils Absolute: 0.2 10*3/uL (ref 0.0–0.5)
Eosinophils Relative: 4 %
Immature Granulocytes: 0 %
Lymphocytes Relative: 22 %
Lymphs Abs: 1.4 10*3/uL (ref 0.7–4.0)
Monocytes Absolute: 0.6 10*3/uL (ref 0.1–1.0)
Monocytes Relative: 10 %
NEUTROS PCT: 63 %
Neutro Abs: 4.1 10*3/uL (ref 1.7–7.7)

## 2018-03-03 LAB — COMPREHENSIVE METABOLIC PANEL
ALT: 53 U/L — ABNORMAL HIGH (ref 0–44)
AST: 31 U/L (ref 15–41)
Albumin: 4 g/dL (ref 3.5–5.0)
Alkaline Phosphatase: 99 U/L (ref 38–126)
Anion gap: 6 (ref 5–15)
BUN: 15 mg/dL (ref 6–20)
CHLORIDE: 108 mmol/L (ref 98–111)
CO2: 26 mmol/L (ref 22–32)
CREATININE: 0.85 mg/dL (ref 0.61–1.24)
Calcium: 8.8 mg/dL — ABNORMAL LOW (ref 8.9–10.3)
GFR calc Af Amer: >60 mL/min (ref >60)
GFR calc non Af Amer: >60 mL/min (ref >60)
Glucose, Bld: 104 mg/dL — ABNORMAL HIGH (ref 70–99)
Potassium: 3.6 mmol/L (ref 3.5–5.1)
Sodium: 140 mmol/L (ref 135–145)
Total Bilirubin: 0.5 mg/dL (ref 0.3–1.2)
Total Protein: 7.3 g/dL (ref 6.5–8.1)

## 2018-03-03 LAB — I-STAT CHEM 8, ED
BUN: 15 mg/dL (ref 6–20)
Calcium, Ion: 1.17 mmol/L (ref 1.15–1.40)
Chloride: 105 mmol/L (ref 98–111)
Creatinine, Ser: 0.9 mg/dL (ref 0.61–1.24)
Glucose, Bld: 101 mg/dL — ABNORMAL HIGH (ref 70–99)
HCT: 42 % (ref 39.0–52.0)
Hemoglobin: 14.3 g/dL (ref 13.0–17.0)
Potassium: 3.7 mmol/L (ref 3.5–5.1)
Sodium: 142 mmol/L (ref 135–145)
TCO2: 28 mmol/L (ref 22–32)

## 2018-03-03 LAB — CBC
HCT: 42.8 % (ref 39.0–52.0)
Hemoglobin: 13.4 g/dL (ref 13.0–17.0)
MCH: 29.1 pg (ref 26.0–34.0)
MCHC: 31.3 g/dL (ref 30.0–36.0)
MCV: 92.8 fL (ref 80.0–100.0)
Platelets: 245 10*3/uL (ref 150–400)
RBC: 4.61 MIL/uL (ref 4.22–5.81)
RDW: 13.8 % (ref 11.5–15.5)
WBC: 6.4 10*3/uL (ref 4.0–10.5)
nRBC: 0 % (ref 0.0–0.2)

## 2018-03-03 LAB — APTT: APTT: 26 s (ref 24–36)

## 2018-03-03 LAB — URINALYSIS, ROUTINE W REFLEX MICROSCOPIC
Bacteria, UA: NONE SEEN
Bilirubin Urine: NEGATIVE
Glucose, UA: NEGATIVE mg/dL
Ketones, ur: NEGATIVE mg/dL
Leukocytes, UA: NEGATIVE
Nitrite: NEGATIVE
Protein, ur: NEGATIVE mg/dL
Specific Gravity, Urine: 1.017 (ref 1.005–1.030)
pH: 5 (ref 5.0–8.0)

## 2018-03-03 LAB — I-STAT TROPONIN, ED: Troponin i, poc: 0 ng/mL (ref 0.00–0.08)

## 2018-03-03 LAB — PROTIME-INR
INR: 0.9
Prothrombin Time: 12 seconds (ref 11.4–15.2)

## 2018-03-03 LAB — CBG MONITORING, ED: GLUCOSE-CAPILLARY: 96 mg/dL (ref 70–99)

## 2018-03-03 LAB — ETHANOL: Alcohol, Ethyl (B): <10 mg/dL (ref <10)
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
## 2018-03-03 NOTE — ED Notes (Addendum)
Having difficult time obtaining PIV. IV ultrasound guidance has been used with no success for IV placement, only to obtain some blood work. Lab attempted to obtain labs x 2, with no success. Dr. Thurnell Garbe made aware.
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
## 2018-03-03 NOTE — ED Notes (Signed)
Pt states he was not staying because we were not going to control his pain. Pt states his car is at [place] and needs transportation to it and I informed him he would have to call someone he knows to take him to his car that we do not provide transportation.

## 2018-03-03 NOTE — Progress Notes (Signed)
CODE STROKE 1635 CALL TIME 1635 BEEPER TIME 1647 EXAM STARTED 1648 EXAM FINISHED 1648 EXAM SENT TO SOC 1650 EXAM COMPLETE IN EPIC 1651 [facility] CALLED
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
## 2018-03-03 NOTE — ED Notes (Signed)
Beeped Code Stroke @1639 
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
## 2018-03-22 ENCOUNTER — Other Ambulatory Visit: Payer: Self-pay | Admitting: Family Medicine

## 2018-03-22 ENCOUNTER — Ambulatory Visit (INDEPENDENT_AMBULATORY_CARE_PROVIDER_SITE_OTHER): Payer: Medicare Other | Admitting: Family Medicine

## 2018-03-22 ENCOUNTER — Encounter: Payer: Self-pay | Admitting: Family Medicine

## 2018-03-22 DIAGNOSIS — Z6827 Body mass index (BMI) 27.0-27.9, adult: Secondary | ICD-10-CM | POA: Diagnosis not present

## 2018-03-22 DIAGNOSIS — M7542 Impingement syndrome of left shoulder: Secondary | ICD-10-CM | POA: Diagnosis not present

## 2018-03-22 DIAGNOSIS — G8929 Other chronic pain: Secondary | ICD-10-CM

## 2018-03-22 DIAGNOSIS — T8789 Other complications of amputation stump: Secondary | ICD-10-CM | POA: Diagnosis not present

## 2018-03-22 DIAGNOSIS — F5101 Primary insomnia: Secondary | ICD-10-CM

## 2018-03-22 DIAGNOSIS — G546 Phantom limb syndrome with pain: Secondary | ICD-10-CM | POA: Diagnosis not present

## 2018-03-22 DIAGNOSIS — Z79899 Other long term (current) drug therapy: Secondary | ICD-10-CM | POA: Diagnosis not present

## 2018-03-22 DIAGNOSIS — M79606 Pain in leg, unspecified: Secondary | ICD-10-CM | POA: Diagnosis not present

## 2018-03-22 DIAGNOSIS — Z89611 Acquired absence of right leg above knee: Secondary | ICD-10-CM | POA: Diagnosis not present

## 2018-03-22 MED ORDER — ZOLPIDEM TARTRATE ER 12.5 MG PO TBCR: 25.0000 mg | EXTENDED_RELEASE_TABLET | Freq: Every day | ORAL | 5 refills | Status: DC

## 2018-03-22 MED ORDER — ALPRAZOLAM 1 MG PO TABS: ORAL_TABLET | ORAL | 5 refills | Status: DC

## 2018-03-22 NOTE — Progress Notes (Signed)
Subjective:  Patient ID: Jason [name], male    DOB: [birth year]  Age: 53 y.o. MRN: [id number]  CC: Medical Management of Chronic Issues   HPI Jason [name] presents for continued follow up of insomnia. Sleeping adequate on current regimen.  Depression screen [facility] 2/9 03/22/2018 12/25/2017 11/18/2017  Decreased Interest 0 0 1  Down, Depressed, Hopeless 0 1 0  PHQ - 2 Score 0 1 1    History Jason [name] has a past medical history of Allergy, Anxiety, Arthritis, Bronchitis, Chest pain (9/50/9326), Complication of anesthesia, DJD (degenerative joint disease) (11/29/2011), Hypertension, Neuromuscular disorder ([place]), PONV (postoperative nausea and vomiting), Septic arthritis of knee, right ([place]) (11/29/2011), and Spinal headache.   He has a past surgical history that includes Knee surgery; Shoulder surgery; Ulnar nerve repair; Appendectomy; Cholecystectomy; Shoulder arthroscopy (07/29/2011); Knee arthroscopy (10/24/2011); Irrigation and debridement knee (11/26/2011); LEFT HEART CATH AND CORONARY ANGIOGRAPHY (N/A, 07/14/2016); and Above knee leg amputaton (Right, 05/26/2014).   His family history includes Arrhythmia in his sister; COPD in his father; Diabetes in his father; Drug abuse in his brother; Heart attack in his father and sister; Hypertension in his father and mother.He reports that he has never smoked. He has never used smokeless tobacco. He reports that he does not drink alcohol or use drugs.    ROS Review of Systems  Constitutional: Negative for fever.  Respiratory: Negative for shortness of breath.   Cardiovascular: Negative for chest pain.  Musculoskeletal: Negative for arthralgias.  Skin: Negative for rash.    Objective:  BP 133/83   Pulse 87   Temp 99 F (37.2 C) (Oral)   Ht 5\' 10"  (1.778 m)   Wt 204 lb (92.5 kg)   BMI 29.27 kg/m   BP Readings from Last 3 Encounters:  03/22/18 133/83  03/03/18 (!) 153/94  12/25/17 (!) 136/94    Wt Readings from Last 3 Encounters:    03/22/18 204 lb (92.5 kg)  12/25/17 225 lb (102.1 kg)  12/02/17 215 lb (97.5 kg)     Physical Exam Vitals signs reviewed.  Constitutional:      Appearance: He is well-developed.  HENT:     Head: Normocephalic and atraumatic.     Right Ear: Tympanic membrane and external ear normal. No decreased hearing noted.     Left Ear: Tympanic membrane and external ear normal. No decreased hearing noted.     Mouth/Throat:     Pharynx: No oropharyngeal exudate or posterior oropharyngeal erythema.  Eyes:     Pupils: Pupils are equal, round, and reactive to light.  Neck:     Musculoskeletal: Normal range of motion and neck supple.  Cardiovascular:     Rate and Rhythm: Normal rate and regular rhythm.     Heart sounds: No murmur.  Pulmonary:     Effort: No respiratory distress.     Breath sounds: Normal breath sounds.  Abdominal:     General: Bowel sounds are normal.     Palpations: Abdomen is soft. There is no mass.     Tenderness: There is no abdominal tenderness.       Assessment & Plan:   Jason [name] was seen today for medical management of chronic issues.  Diagnoses and all orders for this visit:  Primary insomnia -     ALPRAZolam (XANAX) 1 MG tablet; TAKE (1) TABLET BY MOUTH THREE TIMES DAILY AS NEEDED FOR ANXIETY OR AGITATION.  Phantom limb pain (HCC)  Other chronic pain  Shoulder impingement syndrome, left  S/P AKA (  above knee amputation) unilateral, right ([place])  Other orders -     zolpidem (AMBIEN CR) 12.5 MG CR tablet; Take 2 tablets (25 mg total) by mouth at bedtime. Take 1 by mouth QHS for early awakening.       I am having Jason [name]. Aurich maintain his EPINEPHrine, oxyCODONE, ondansetron, ALPRAZolam, and zolpidem.  Allergies as of 03/22/2018      Reactions   Iodine Rash   Reports localized reaction at IV site. Able to tolerate with Benadryl.    Ivp Dye [iodinated Diagnostic Agents] Anaphylaxis   Can be pre-treated with Benadryl   Metrizamide Anaphylaxis    Other Other (See Comments)   Under no circumstances will the patient agree to a PICC line   Reglan [metoclopramide] Anaphylaxis   Shellfish-derived Products Anaphylaxis   Toradol [ketorolac Tromethamine] Itching   Patient having systemic itching after receiving IV dose   Lidocaine Other (See Comments), Rash   Pt not sure if this is an actual allergy - might have been a one time incident   Morphine Swelling, Rash   Local reaction to IV being pushed too fast   Vancomycin Rash   Has had vancomycin since this reaction and had no reaction at all   Codeine Hives   Methadone Hives   Propoxyphene Hives   Amoxicillin Rash   Patient denies true reaction   Penicillins Rash   Has patient had a PCN reaction causing immediate rash, facial/tongue/throat swelling, SOB or lightheadedness with hypotension: Yes Has patient had a PCN reaction causing severe rash involving mucus membranes or skin necrosis: No Has patient had a PCN reaction that required hospitalization No Has patient had a PCN reaction occurring within the last 10 years: No If all of the above answers are "NO", then may proceed with Cephalosporin use.   Tape Rash      Medication List       Accurate as of March 22, 2018 12:37 PM. Always use your most recent med list.        ALPRAZolam 1 MG tablet Commonly known as:  XANAX TAKE (1) TABLET BY MOUTH THREE TIMES DAILY AS NEEDED FOR ANXIETY OR AGITATION.   EPINEPHrine 0.3 mg/0.3 mL Soaj injection Commonly known as:  EPI-PEN Inject 0.3 mg into the muscle once as needed for anaphylaxis.   ondansetron 4 MG disintegrating tablet Commonly known as:  ZOFRAN-ODT DISSOLVE (1) TABLET BY MOUTH EVERY 8 HOURS AS NEEDED FOR UP TO 7 DAYS.   oxyCODONE 15 MG immediate release tablet Commonly known as:  ROXICODONE Take 1 tablet (15 mg total) by mouth every 6 (six) hours as needed for pain.   zolpidem 12.5 MG CR tablet Commonly known as:  AMBIEN CR Take 2 tablets (25 mg total) by mouth at  bedtime. Take 1 by mouth QHS for early awakening.        Follow-up: Return in about 6 months (around 09/20/2018).  Claretta Fraise, M.D.

## 2018-03-23 NOTE — Telephone Encounter (Signed)
Pt seen yesterday for this

## 2018-03-29 ENCOUNTER — Other Ambulatory Visit: Payer: Self-pay | Admitting: Family Medicine

## 2018-03-29 ENCOUNTER — Telehealth: Payer: Self-pay | Admitting: Family Medicine

## 2018-03-29 MED ORDER — TOBRAMYCIN-DEXAMETHASONE 0.3-0.1 % OP SUSP: OPHTHALMIC | 0 refills | Status: DC

## 2018-03-29 NOTE — Telephone Encounter (Signed)
Aware of new medication. 

## 2018-03-29 NOTE — Telephone Encounter (Signed)
I sent in the requested prescription 

## 2018-03-29 NOTE — Telephone Encounter (Signed)
Patient aware, per voice mail,  script is ready. 

## 2018-03-29 NOTE — Telephone Encounter (Signed)
Please advise on request for pink eye medication.

## 2018-03-31 ENCOUNTER — Ambulatory Visit: Payer: Medicare Other | Admitting: Family Medicine

## 2018-03-31 DIAGNOSIS — Z79899 Other long term (current) drug therapy: Secondary | ICD-10-CM | POA: Diagnosis not present

## 2018-03-31 DIAGNOSIS — R4182 Altered mental status, unspecified: Secondary | ICD-10-CM | POA: Diagnosis not present

## 2018-03-31 DIAGNOSIS — Z041 Encounter for examination and observation following transport accident: Secondary | ICD-10-CM | POA: Diagnosis not present

## 2018-03-31 DIAGNOSIS — Z885 Allergy status to narcotic agent status: Secondary | ICD-10-CM | POA: Diagnosis not present

## 2018-03-31 DIAGNOSIS — L03115 Cellulitis of right lower limb: Secondary | ICD-10-CM | POA: Diagnosis not present

## 2018-03-31 DIAGNOSIS — R1011 Right upper quadrant pain: Secondary | ICD-10-CM | POA: Diagnosis not present

## 2018-03-31 DIAGNOSIS — M7989 Other specified soft tissue disorders: Secondary | ICD-10-CM | POA: Diagnosis not present

## 2018-03-31 DIAGNOSIS — S098XXA Other specified injuries of head, initial encounter: Secondary | ICD-10-CM | POA: Diagnosis not present

## 2018-03-31 DIAGNOSIS — S3991XA Unspecified injury of abdomen, initial encounter: Secondary | ICD-10-CM | POA: Diagnosis not present

## 2018-03-31 DIAGNOSIS — R0689 Other abnormalities of breathing: Secondary | ICD-10-CM | POA: Diagnosis not present

## 2018-03-31 DIAGNOSIS — R0600 Dyspnea, unspecified: Secondary | ICD-10-CM | POA: Diagnosis not present

## 2018-03-31 DIAGNOSIS — Z89611 Acquired absence of right leg above knee: Secondary | ICD-10-CM | POA: Diagnosis not present

## 2018-03-31 DIAGNOSIS — F5101 Primary insomnia: Secondary | ICD-10-CM | POA: Diagnosis present

## 2018-03-31 DIAGNOSIS — M79622 Pain in left upper arm: Secondary | ICD-10-CM | POA: Diagnosis not present

## 2018-03-31 DIAGNOSIS — I10 Essential (primary) hypertension: Secondary | ICD-10-CM | POA: Diagnosis not present

## 2018-03-31 DIAGNOSIS — S32028A Other fracture of second lumbar vertebra, initial encounter for closed fracture: Secondary | ICD-10-CM | POA: Diagnosis present

## 2018-03-31 DIAGNOSIS — M25522 Pain in left elbow: Secondary | ICD-10-CM | POA: Diagnosis not present

## 2018-03-31 DIAGNOSIS — K219 Gastro-esophageal reflux disease without esophagitis: Secondary | ICD-10-CM | POA: Diagnosis present

## 2018-03-31 DIAGNOSIS — S066X0A Traumatic subarachnoid hemorrhage without loss of consciousness, initial encounter: Secondary | ICD-10-CM | POA: Diagnosis not present

## 2018-03-31 DIAGNOSIS — S066X9A Traumatic subarachnoid hemorrhage with loss of consciousness of unspecified duration, initial encounter: Secondary | ICD-10-CM | POA: Diagnosis present

## 2018-03-31 DIAGNOSIS — Z9049 Acquired absence of other specified parts of digestive tract: Secondary | ICD-10-CM | POA: Diagnosis not present

## 2018-03-31 DIAGNOSIS — M549 Dorsalgia, unspecified: Secondary | ICD-10-CM | POA: Diagnosis not present

## 2018-03-31 DIAGNOSIS — Y92410 Unspecified street and highway as the place of occurrence of the external cause: Secondary | ICD-10-CM | POA: Diagnosis not present

## 2018-03-31 DIAGNOSIS — Z881 Allergy status to other antibiotic agents status: Secondary | ICD-10-CM | POA: Diagnosis not present

## 2018-03-31 DIAGNOSIS — F419 Anxiety disorder, unspecified: Secondary | ICD-10-CM | POA: Diagnosis present

## 2018-03-31 DIAGNOSIS — Y999 Unspecified external cause status: Secondary | ICD-10-CM | POA: Diagnosis not present

## 2018-03-31 DIAGNOSIS — M25512 Pain in left shoulder: Secondary | ICD-10-CM | POA: Diagnosis not present

## 2018-03-31 DIAGNOSIS — Z91041 Radiographic dye allergy status: Secondary | ICD-10-CM | POA: Diagnosis not present

## 2018-03-31 DIAGNOSIS — I7789 Other specified disorders of arteries and arterioles: Secondary | ICD-10-CM | POA: Diagnosis not present

## 2018-03-31 DIAGNOSIS — M79672 Pain in left foot: Secondary | ICD-10-CM | POA: Diagnosis not present

## 2018-03-31 DIAGNOSIS — M25531 Pain in right wrist: Secondary | ICD-10-CM | POA: Diagnosis not present

## 2018-03-31 DIAGNOSIS — Z8249 Family history of ischemic heart disease and other diseases of the circulatory system: Secondary | ICD-10-CM | POA: Diagnosis not present

## 2018-03-31 DIAGNOSIS — M25532 Pain in left wrist: Secondary | ICD-10-CM | POA: Diagnosis not present

## 2018-03-31 DIAGNOSIS — Z049 Encounter for examination and observation for unspecified reason: Secondary | ICD-10-CM | POA: Diagnosis not present

## 2018-03-31 DIAGNOSIS — Z79891 Long term (current) use of opiate analgesic: Secondary | ICD-10-CM | POA: Diagnosis not present

## 2018-03-31 DIAGNOSIS — S32009A Unspecified fracture of unspecified lumbar vertebra, initial encounter for closed fracture: Secondary | ICD-10-CM | POA: Diagnosis not present

## 2018-03-31 DIAGNOSIS — S32029A Unspecified fracture of second lumbar vertebra, initial encounter for closed fracture: Secondary | ICD-10-CM | POA: Diagnosis not present

## 2018-03-31 DIAGNOSIS — R102 Pelvic and perineal pain: Secondary | ICD-10-CM | POA: Diagnosis not present

## 2018-03-31 DIAGNOSIS — Z880 Allergy status to penicillin: Secondary | ICD-10-CM | POA: Diagnosis not present

## 2018-03-31 DIAGNOSIS — M79632 Pain in left forearm: Secondary | ICD-10-CM | POA: Diagnosis not present

## 2018-03-31 DIAGNOSIS — G8929 Other chronic pain: Secondary | ICD-10-CM | POA: Diagnosis present

## 2018-03-31 DIAGNOSIS — M79621 Pain in right upper arm: Secondary | ICD-10-CM | POA: Diagnosis not present

## 2018-03-31 DIAGNOSIS — M25511 Pain in right shoulder: Secondary | ICD-10-CM | POA: Diagnosis not present

## 2018-03-31 DIAGNOSIS — S78111A Complete traumatic amputation at level between right hip and knee, initial encounter: Secondary | ICD-10-CM | POA: Diagnosis not present

## 2018-03-31 DIAGNOSIS — G4700 Insomnia, unspecified: Secondary | ICD-10-CM | POA: Diagnosis present

## 2018-03-31 DIAGNOSIS — M25572 Pain in left ankle and joints of left foot: Secondary | ICD-10-CM | POA: Diagnosis not present

## 2018-04-10 ENCOUNTER — Other Ambulatory Visit: Payer: Self-pay | Admitting: Family Medicine

## 2018-04-23 DIAGNOSIS — Z79899 Other long term (current) drug therapy: Secondary | ICD-10-CM | POA: Diagnosis not present

## 2018-04-23 DIAGNOSIS — T8789 Other complications of amputation stump: Secondary | ICD-10-CM | POA: Diagnosis not present

## 2018-04-23 DIAGNOSIS — M79606 Pain in leg, unspecified: Secondary | ICD-10-CM | POA: Diagnosis not present

## 2018-04-23 DIAGNOSIS — G546 Phantom limb syndrome with pain: Secondary | ICD-10-CM | POA: Diagnosis not present

## 2018-04-23 DIAGNOSIS — G629 Polyneuropathy, unspecified: Secondary | ICD-10-CM | POA: Diagnosis not present

## 2018-05-07 ENCOUNTER — Ambulatory Visit: Payer: Medicare Other | Admitting: Family Medicine

## 2018-05-10 ENCOUNTER — Encounter: Payer: Self-pay | Admitting: Family Medicine

## 2018-05-24 DIAGNOSIS — S32020A Wedge compression fracture of second lumbar vertebra, initial encounter for closed fracture: Secondary | ICD-10-CM | POA: Diagnosis not present

## 2018-05-24 DIAGNOSIS — R918 Other nonspecific abnormal finding of lung field: Secondary | ICD-10-CM | POA: Diagnosis not present

## 2018-05-24 DIAGNOSIS — I10 Essential (primary) hypertension: Secondary | ICD-10-CM | POA: Diagnosis not present

## 2018-05-24 DIAGNOSIS — R52 Pain, unspecified: Secondary | ICD-10-CM | POA: Diagnosis not present

## 2018-05-24 DIAGNOSIS — M542 Cervicalgia: Secondary | ICD-10-CM | POA: Diagnosis not present

## 2018-05-24 DIAGNOSIS — R55 Syncope and collapse: Secondary | ICD-10-CM | POA: Diagnosis not present

## 2018-05-24 DIAGNOSIS — R140 Abdominal distension (gaseous): Secondary | ICD-10-CM | POA: Diagnosis not present

## 2018-05-24 DIAGNOSIS — M48061 Spinal stenosis, lumbar region without neurogenic claudication: Secondary | ICD-10-CM | POA: Diagnosis not present

## 2018-05-24 DIAGNOSIS — R51 Headache: Secondary | ICD-10-CM | POA: Diagnosis not present

## 2018-05-24 DIAGNOSIS — R0789 Other chest pain: Secondary | ICD-10-CM | POA: Diagnosis not present

## 2018-05-24 DIAGNOSIS — M79606 Pain in leg, unspecified: Secondary | ICD-10-CM | POA: Diagnosis not present

## 2018-05-24 DIAGNOSIS — M47814 Spondylosis without myelopathy or radiculopathy, thoracic region: Secondary | ICD-10-CM | POA: Diagnosis not present

## 2018-05-24 DIAGNOSIS — M25572 Pain in left ankle and joints of left foot: Secondary | ICD-10-CM | POA: Diagnosis not present

## 2018-05-24 DIAGNOSIS — M545 Low back pain: Secondary | ICD-10-CM | POA: Diagnosis not present

## 2018-05-24 DIAGNOSIS — Y999 Unspecified external cause status: Secondary | ICD-10-CM | POA: Diagnosis not present

## 2018-05-24 DIAGNOSIS — T8789 Other complications of amputation stump: Secondary | ICD-10-CM | POA: Diagnosis not present

## 2018-05-24 DIAGNOSIS — M4804 Spinal stenosis, thoracic region: Secondary | ICD-10-CM | POA: Diagnosis not present

## 2018-05-24 DIAGNOSIS — M7732 Calcaneal spur, left foot: Secondary | ICD-10-CM | POA: Diagnosis not present

## 2018-05-24 DIAGNOSIS — G546 Phantom limb syndrome with pain: Secondary | ICD-10-CM | POA: Diagnosis not present

## 2018-05-24 DIAGNOSIS — M79672 Pain in left foot: Secondary | ICD-10-CM | POA: Diagnosis not present

## 2018-05-24 DIAGNOSIS — S32028A Other fracture of second lumbar vertebra, initial encounter for closed fracture: Secondary | ICD-10-CM | POA: Diagnosis not present

## 2018-05-24 DIAGNOSIS — M546 Pain in thoracic spine: Secondary | ICD-10-CM | POA: Diagnosis not present

## 2018-05-24 DIAGNOSIS — M79662 Pain in left lower leg: Secondary | ICD-10-CM | POA: Diagnosis not present

## 2018-05-24 DIAGNOSIS — M25551 Pain in right hip: Secondary | ICD-10-CM | POA: Diagnosis not present

## 2018-05-24 DIAGNOSIS — S12301D Unspecified nondisplaced fracture of fourth cervical vertebra, subsequent encounter for fracture with routine healing: Secondary | ICD-10-CM | POA: Diagnosis not present

## 2018-05-24 DIAGNOSIS — Z79899 Other long term (current) drug therapy: Secondary | ICD-10-CM | POA: Diagnosis not present

## 2018-05-24 DIAGNOSIS — R9389 Abnormal findings on diagnostic imaging of other specified body structures: Secondary | ICD-10-CM | POA: Diagnosis not present

## 2018-05-24 DIAGNOSIS — Y92410 Unspecified street and highway as the place of occurrence of the external cause: Secondary | ICD-10-CM | POA: Diagnosis not present

## 2018-05-24 DIAGNOSIS — S22080A Wedge compression fracture of T11-T12 vertebra, initial encounter for closed fracture: Secondary | ICD-10-CM | POA: Diagnosis not present

## 2018-05-25 DIAGNOSIS — M546 Pain in thoracic spine: Secondary | ICD-10-CM | POA: Diagnosis not present

## 2018-05-25 DIAGNOSIS — S22080A Wedge compression fracture of T11-T12 vertebra, initial encounter for closed fracture: Secondary | ICD-10-CM | POA: Diagnosis not present

## 2018-05-25 DIAGNOSIS — M47814 Spondylosis without myelopathy or radiculopathy, thoracic region: Secondary | ICD-10-CM | POA: Diagnosis not present

## 2018-05-25 DIAGNOSIS — S32020A Wedge compression fracture of second lumbar vertebra, initial encounter for closed fracture: Secondary | ICD-10-CM | POA: Diagnosis not present

## 2018-05-25 DIAGNOSIS — R0789 Other chest pain: Secondary | ICD-10-CM | POA: Diagnosis not present

## 2018-05-25 DIAGNOSIS — M545 Low back pain: Secondary | ICD-10-CM | POA: Diagnosis not present

## 2018-05-26 ENCOUNTER — Telehealth: Payer: Self-pay | Admitting: Family Medicine

## 2018-05-26 DIAGNOSIS — R110 Nausea: Secondary | ICD-10-CM | POA: Diagnosis not present

## 2018-05-26 DIAGNOSIS — Z049 Encounter for examination and observation for unspecified reason: Secondary | ICD-10-CM | POA: Diagnosis not present

## 2018-05-26 DIAGNOSIS — I10 Essential (primary) hypertension: Secondary | ICD-10-CM | POA: Diagnosis not present

## 2018-05-26 DIAGNOSIS — R0789 Other chest pain: Secondary | ICD-10-CM | POA: Diagnosis not present

## 2018-05-26 DIAGNOSIS — R000 Tachycardia, unspecified: Secondary | ICD-10-CM | POA: Diagnosis not present

## 2018-05-26 DIAGNOSIS — Z79899 Other long term (current) drug therapy: Secondary | ICD-10-CM | POA: Diagnosis not present

## 2018-05-26 DIAGNOSIS — R0602 Shortness of breath: Secondary | ICD-10-CM | POA: Diagnosis not present

## 2018-05-26 DIAGNOSIS — R6884 Jaw pain: Secondary | ICD-10-CM | POA: Diagnosis not present

## 2018-05-26 DIAGNOSIS — R531 Weakness: Secondary | ICD-10-CM | POA: Diagnosis not present

## 2018-05-26 DIAGNOSIS — R079 Chest pain, unspecified: Secondary | ICD-10-CM | POA: Diagnosis not present
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
## 2018-05-26 NOTE — Telephone Encounter (Signed)
Please contact the patient. I can't do that. He can try the E.D., but I would have to see him for this. WS

## 2018-05-26 NOTE — Telephone Encounter (Signed)
appt scheduled Pt notified 

## 2018-05-27 DIAGNOSIS — R079 Chest pain, unspecified: Secondary | ICD-10-CM | POA: Diagnosis not present

## 2018-05-28 ENCOUNTER — Other Ambulatory Visit: Payer: Self-pay

## 2018-05-28 ENCOUNTER — Encounter: Payer: Self-pay | Admitting: Family Medicine

## 2018-05-28 ENCOUNTER — Ambulatory Visit (INDEPENDENT_AMBULATORY_CARE_PROVIDER_SITE_OTHER): Payer: Medicare Other | Admitting: Family Medicine

## 2018-05-28 VITALS — BP 128/83 | HR 91 | Temp 97.0°F | Ht 70.0 in | Wt 204.4 lb

## 2018-05-28 DIAGNOSIS — G4701 Insomnia due to medical condition: Secondary | ICD-10-CM

## 2018-05-28 DIAGNOSIS — G8929 Other chronic pain: Secondary | ICD-10-CM | POA: Diagnosis not present

## 2018-05-28 MED ORDER — SODIUM CHLORIDE 0.9 % IV SOLN
10.00 | INTRAVENOUS | Status: DC
Start: ? — End: 2018-05-28

## 2018-05-28 MED ORDER — GENERIC EXTERNAL MEDICATION
10.00 | Status: DC
Start: ? — End: 2018-05-28
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
## 2018-06-03 LAB — TOXASSURE SELECT 13 (MW), URINE

## 2018-06-21 DIAGNOSIS — M25551 Pain in right hip: Secondary | ICD-10-CM | POA: Diagnosis not present

## 2018-06-21 DIAGNOSIS — Z79899 Other long term (current) drug therapy: Secondary | ICD-10-CM | POA: Diagnosis not present

## 2018-06-21 DIAGNOSIS — T8789 Other complications of amputation stump: Secondary | ICD-10-CM | POA: Diagnosis not present

## 2018-06-21 DIAGNOSIS — G546 Phantom limb syndrome with pain: Secondary | ICD-10-CM | POA: Diagnosis not present

## 2018-06-21 DIAGNOSIS — M79606 Pain in leg, unspecified: Secondary | ICD-10-CM | POA: Diagnosis not present

## 2018-07-05 DIAGNOSIS — S22080D Wedge compression fracture of T11-T12 vertebra, subsequent encounter for fracture with routine healing: Secondary | ICD-10-CM | POA: Diagnosis not present

## 2018-07-19 DIAGNOSIS — Z79899 Other long term (current) drug therapy: Secondary | ICD-10-CM | POA: Diagnosis not present

## 2018-07-19 DIAGNOSIS — T8789 Other complications of amputation stump: Secondary | ICD-10-CM | POA: Diagnosis not present

## 2018-07-19 DIAGNOSIS — M79606 Pain in leg, unspecified: Secondary | ICD-10-CM | POA: Diagnosis not present

## 2018-07-19 DIAGNOSIS — M25551 Pain in right hip: Secondary | ICD-10-CM | POA: Diagnosis not present

## 2018-07-19 DIAGNOSIS — G546 Phantom limb syndrome with pain: Secondary | ICD-10-CM | POA: Diagnosis not present

## 2018-07-21 DIAGNOSIS — R0600 Dyspnea, unspecified: Secondary | ICD-10-CM | POA: Diagnosis not present

## 2018-07-21 DIAGNOSIS — R0602 Shortness of breath: Secondary | ICD-10-CM | POA: Diagnosis not present

## 2018-07-21 DIAGNOSIS — R112 Nausea with vomiting, unspecified: Secondary | ICD-10-CM | POA: Diagnosis not present

## 2018-07-21 DIAGNOSIS — R471 Dysarthria and anarthria: Secondary | ICD-10-CM | POA: Diagnosis not present

## 2018-07-21 DIAGNOSIS — Z885 Allergy status to narcotic agent status: Secondary | ICD-10-CM | POA: Diagnosis not present

## 2018-07-21 DIAGNOSIS — R531 Weakness: Secondary | ICD-10-CM | POA: Diagnosis not present

## 2018-07-21 DIAGNOSIS — R9089 Other abnormal findings on diagnostic imaging of central nervous system: Secondary | ICD-10-CM | POA: Diagnosis not present

## 2018-07-21 DIAGNOSIS — Z8673 Personal history of transient ischemic attack (TIA), and cerebral infarction without residual deficits: Secondary | ICD-10-CM | POA: Diagnosis not present

## 2018-07-21 DIAGNOSIS — Z79899 Other long term (current) drug therapy: Secondary | ICD-10-CM | POA: Diagnosis not present

## 2018-07-21 DIAGNOSIS — R51 Headache: Secondary | ICD-10-CM | POA: Diagnosis not present

## 2018-07-21 DIAGNOSIS — R110 Nausea: Secondary | ICD-10-CM | POA: Diagnosis not present

## 2018-07-21 DIAGNOSIS — R270 Ataxia, unspecified: Secondary | ICD-10-CM | POA: Diagnosis not present

## 2018-07-21 DIAGNOSIS — Z888 Allergy status to other drugs, medicaments and biological substances status: Secondary | ICD-10-CM | POA: Diagnosis not present

## 2018-07-21 DIAGNOSIS — I10 Essential (primary) hypertension: Secondary | ICD-10-CM | POA: Diagnosis not present

## 2018-07-21 DIAGNOSIS — Z91048 Other nonmedicinal substance allergy status: Secondary | ICD-10-CM | POA: Diagnosis not present

## 2018-07-21 DIAGNOSIS — Z049 Encounter for examination and observation for unspecified reason: Secondary | ICD-10-CM | POA: Diagnosis not present

## 2018-07-21 DIAGNOSIS — Z89611 Acquired absence of right leg above knee: Secondary | ICD-10-CM | POA: Diagnosis not present

## 2018-07-21 DIAGNOSIS — R1310 Dysphagia, unspecified: Secondary | ICD-10-CM | POA: Diagnosis not present

## 2018-07-21 DIAGNOSIS — K589 Irritable bowel syndrome without diarrhea: Secondary | ICD-10-CM | POA: Diagnosis not present

## 2018-07-21 DIAGNOSIS — I639 Cerebral infarction, unspecified: Secondary | ICD-10-CM | POA: Diagnosis not present

## 2018-07-21 DIAGNOSIS — Z880 Allergy status to penicillin: Secondary | ICD-10-CM | POA: Diagnosis not present

## 2018-07-21 DIAGNOSIS — Z881 Allergy status to other antibiotic agents status: Secondary | ICD-10-CM | POA: Diagnosis not present

## 2018-07-21 DIAGNOSIS — R42 Dizziness and giddiness: Secondary | ICD-10-CM | POA: Diagnosis not present

## 2018-07-22 DIAGNOSIS — R0600 Dyspnea, unspecified: Secondary | ICD-10-CM | POA: Diagnosis not present

## 2018-07-22 DIAGNOSIS — R531 Weakness: Secondary | ICD-10-CM | POA: Diagnosis not present

## 2018-07-22 DIAGNOSIS — R9089 Other abnormal findings on diagnostic imaging of central nervous system: Secondary | ICD-10-CM | POA: Diagnosis not present

## 2018-08-03 ENCOUNTER — Telehealth: Payer: Self-pay | Admitting: Family Medicine

## 2018-08-03 NOTE — Telephone Encounter (Signed)
He can have a return to work note. Just get the dates he needs from him. Thanks, WS

## 2018-08-03 NOTE — Telephone Encounter (Signed)
lmtcb

## 2018-08-04 ENCOUNTER — Encounter: Payer: Self-pay | Admitting: *Deleted

## 2018-08-04 NOTE — Telephone Encounter (Signed)
He just needs note saying that he can return to work, he states that it doesn't need any dates

## 2018-08-04 NOTE — Telephone Encounter (Signed)
Note faxed. Pt aware

## 2018-08-16 DIAGNOSIS — Z880 Allergy status to penicillin: Secondary | ICD-10-CM | POA: Diagnosis not present

## 2018-08-16 DIAGNOSIS — I10 Essential (primary) hypertension: Secondary | ICD-10-CM | POA: Diagnosis not present

## 2018-08-16 DIAGNOSIS — Z049 Encounter for examination and observation for unspecified reason: Secondary | ICD-10-CM | POA: Diagnosis not present

## 2018-08-16 DIAGNOSIS — Z881 Allergy status to other antibiotic agents status: Secondary | ICD-10-CM | POA: Diagnosis not present

## 2018-08-16 DIAGNOSIS — S32020A Wedge compression fracture of second lumbar vertebra, initial encounter for closed fracture: Secondary | ICD-10-CM | POA: Diagnosis not present

## 2018-08-16 DIAGNOSIS — M47817 Spondylosis without myelopathy or radiculopathy, lumbosacral region: Secondary | ICD-10-CM | POA: Diagnosis not present

## 2018-08-16 DIAGNOSIS — M549 Dorsalgia, unspecified: Secondary | ICD-10-CM | POA: Diagnosis not present

## 2018-08-16 DIAGNOSIS — Z79899 Other long term (current) drug therapy: Secondary | ICD-10-CM | POA: Diagnosis not present

## 2018-08-16 DIAGNOSIS — M4856XA Collapsed vertebra, not elsewhere classified, lumbar region, initial encounter for fracture: Secondary | ICD-10-CM | POA: Diagnosis not present

## 2018-08-16 DIAGNOSIS — K589 Irritable bowel syndrome without diarrhea: Secondary | ICD-10-CM | POA: Diagnosis not present

## 2018-08-16 DIAGNOSIS — M47816 Spondylosis without myelopathy or radiculopathy, lumbar region: Secondary | ICD-10-CM | POA: Diagnosis not present

## 2018-08-16 DIAGNOSIS — Z89611 Acquired absence of right leg above knee: Secondary | ICD-10-CM | POA: Diagnosis not present

## 2018-08-16 DIAGNOSIS — N529 Male erectile dysfunction, unspecified: Secondary | ICD-10-CM | POA: Diagnosis not present

## 2018-08-16 DIAGNOSIS — M5442 Lumbago with sciatica, left side: Secondary | ICD-10-CM | POA: Diagnosis not present

## 2018-08-16 DIAGNOSIS — M79605 Pain in left leg: Secondary | ICD-10-CM | POA: Diagnosis not present

## 2018-08-16 DIAGNOSIS — T8789 Other complications of amputation stump: Secondary | ICD-10-CM | POA: Diagnosis not present

## 2018-08-16 DIAGNOSIS — S32010A Wedge compression fracture of first lumbar vertebra, initial encounter for closed fracture: Secondary | ICD-10-CM | POA: Diagnosis not present

## 2018-08-16 DIAGNOSIS — M5136 Other intervertebral disc degeneration, lumbar region: Secondary | ICD-10-CM | POA: Diagnosis not present

## 2018-08-16 DIAGNOSIS — Z888 Allergy status to other drugs, medicaments and biological substances status: Secondary | ICD-10-CM | POA: Diagnosis not present

## 2018-08-16 DIAGNOSIS — M79606 Pain in leg, unspecified: Secondary | ICD-10-CM | POA: Diagnosis not present

## 2018-08-16 DIAGNOSIS — Z885 Allergy status to narcotic agent status: Secondary | ICD-10-CM | POA: Diagnosis not present

## 2018-08-17 DIAGNOSIS — S32020A Wedge compression fracture of second lumbar vertebra, initial encounter for closed fracture: Secondary | ICD-10-CM | POA: Diagnosis not present

## 2018-08-17 DIAGNOSIS — I10 Essential (primary) hypertension: Secondary | ICD-10-CM | POA: Diagnosis not present

## 2018-08-17 DIAGNOSIS — Z96653 Presence of artificial knee joint, bilateral: Secondary | ICD-10-CM | POA: Diagnosis not present

## 2018-08-17 DIAGNOSIS — M5416 Radiculopathy, lumbar region: Secondary | ICD-10-CM | POA: Diagnosis not present

## 2018-08-17 DIAGNOSIS — R32 Unspecified urinary incontinence: Secondary | ICD-10-CM | POA: Diagnosis not present

## 2018-08-17 DIAGNOSIS — S32028A Other fracture of second lumbar vertebra, initial encounter for closed fracture: Secondary | ICD-10-CM | POA: Diagnosis not present

## 2018-08-17 DIAGNOSIS — M439 Deforming dorsopathy, unspecified: Secondary | ICD-10-CM | POA: Diagnosis not present

## 2018-08-17 DIAGNOSIS — M549 Dorsalgia, unspecified: Secondary | ICD-10-CM | POA: Diagnosis not present

## 2018-08-17 DIAGNOSIS — R531 Weakness: Secondary | ICD-10-CM | POA: Diagnosis not present

## 2018-08-18 ENCOUNTER — Other Ambulatory Visit: Payer: Self-pay

## 2018-08-18 ENCOUNTER — Emergency Department (HOSPITAL_COMMUNITY)
Admission: EM | Admit: 2018-08-18 | Discharge: 2018-08-19 | Disposition: A | Discharge status: Home / Self Care | Payer: Medicare Other | Attending: Emergency Medicine | Admitting: Emergency Medicine

## 2018-08-18 ENCOUNTER — Encounter (HOSPITAL_COMMUNITY): Payer: Self-pay | Admitting: Emergency Medicine

## 2018-08-18 DIAGNOSIS — M549 Dorsalgia, unspecified: Secondary | ICD-10-CM | POA: Diagnosis not present

## 2018-08-18 DIAGNOSIS — K922 Gastrointestinal hemorrhage, unspecified: Secondary | ICD-10-CM | POA: Diagnosis not present

## 2018-08-18 DIAGNOSIS — G8929 Other chronic pain: Secondary | ICD-10-CM

## 2018-08-18 DIAGNOSIS — M545 Low back pain, unspecified: Secondary | ICD-10-CM

## 2018-08-18 DIAGNOSIS — I10 Essential (primary) hypertension: Secondary | ICD-10-CM | POA: Insufficient documentation

## 2018-08-18 DIAGNOSIS — Z9049 Acquired absence of other specified parts of digestive tract: Secondary | ICD-10-CM | POA: Diagnosis not present

## 2018-08-18 DIAGNOSIS — R55 Syncope and collapse: Secondary | ICD-10-CM | POA: Diagnosis not present

## 2018-08-18 DIAGNOSIS — Z79899 Other long term (current) drug therapy: Secondary | ICD-10-CM | POA: Diagnosis not present

## 2018-08-18 DIAGNOSIS — R202 Paresthesia of skin: Secondary | ICD-10-CM | POA: Diagnosis not present

## 2018-08-18 DIAGNOSIS — Z96653 Presence of artificial knee joint, bilateral: Secondary | ICD-10-CM | POA: Diagnosis not present

## 2018-08-18 DIAGNOSIS — Z049 Encounter for examination and observation for unspecified reason: Secondary | ICD-10-CM | POA: Diagnosis not present

## 2018-08-18 DIAGNOSIS — R32 Unspecified urinary incontinence: Secondary | ICD-10-CM | POA: Diagnosis not present

## 2018-08-18 DIAGNOSIS — Z8739 Personal history of other diseases of the musculoskeletal system and connective tissue: Secondary | ICD-10-CM | POA: Diagnosis not present

## 2018-08-18 DIAGNOSIS — Z862 Personal history of diseases of the blood and blood-forming organs and certain disorders involving the immune mechanism: Secondary | ICD-10-CM | POA: Diagnosis not present

## 2018-08-18 DIAGNOSIS — M439 Deforming dorsopathy, unspecified: Secondary | ICD-10-CM | POA: Diagnosis not present

## 2018-08-18 DIAGNOSIS — Z89611 Acquired absence of right leg above knee: Secondary | ICD-10-CM | POA: Diagnosis not present

## 2018-08-18 DIAGNOSIS — R109 Unspecified abdominal pain: Secondary | ICD-10-CM | POA: Diagnosis not present

## 2018-08-18 DIAGNOSIS — Z20828 Contact with and (suspected) exposure to other viral communicable diseases: Secondary | ICD-10-CM | POA: Diagnosis not present

## 2018-08-18 DIAGNOSIS — Z8719 Personal history of other diseases of the digestive system: Secondary | ICD-10-CM | POA: Diagnosis not present

## 2018-08-18 DIAGNOSIS — K625 Hemorrhage of anus and rectum: Secondary | ICD-10-CM | POA: Diagnosis not present

## 2018-08-18 DIAGNOSIS — S32028A Other fracture of second lumbar vertebra, initial encounter for closed fracture: Secondary | ICD-10-CM | POA: Diagnosis not present

## 2018-08-18 LAB — BASIC METABOLIC PANEL
Anion gap: 9 (ref 5–15)
BUN: 7 mg/dL (ref 6–20)
CO2: 24 mmol/L (ref 22–32)
Calcium: 9.2 mg/dL (ref 8.9–10.3)
Chloride: 107 mmol/L (ref 98–111)
Creatinine, Ser: 0.85 mg/dL (ref 0.61–1.24)
GFR calc Af Amer: >60 mL/min (ref >60)
GFR calc non Af Amer: >60 mL/min (ref >60)
Glucose, Bld: 89 mg/dL (ref 70–99)
Potassium: 4 mmol/L (ref 3.5–5.1)
Sodium: 140 mmol/L (ref 135–145)

## 2018-08-18 LAB — CBC
HCT: 47.2 % (ref 39.0–52.0)
Hemoglobin: 15.8 g/dL (ref 13.0–17.0)
MCH: 30.3 pg (ref 26.0–34.0)
MCHC: 33.5 g/dL (ref 30.0–36.0)
MCV: 90.4 fL (ref 80.0–100.0)
Platelets: 261 10*3/uL (ref 150–400)
RBC: 5.22 MIL/uL (ref 4.22–5.81)
RDW: 13.1 % (ref 11.5–15.5)
WBC: 5.7 10*3/uL (ref 4.0–10.5)
nRBC: 0 % (ref 0.0–0.2)

## 2018-08-18 LAB — CBG MONITORING, ED
Glucose-Capillary: 75 mg/dL (ref 70–99)
Glucose-Capillary: 75 mg/dL (ref 70–99)

## 2018-08-18 MED ORDER — SODIUM CHLORIDE 0.9% FLUSH
3.0000 mL | Freq: Once | INTRAVENOUS | Status: AC
  Administered 2018-08-18: 3 mL via INTRAVENOUS

## 2018-08-18 MED ORDER — METHOCARBAMOL 1000 MG/10ML IJ SOLN
500.0000 mg | Freq: Once | INTRAVENOUS | Status: AC
  Administered 2018-08-19: 500 mg via INTRAVENOUS
  Filled 2018-08-18: qty 500

## 2018-08-18 MED ORDER — DEXAMETHASONE SODIUM PHOSPHATE 10 MG/ML IJ SOLN
10.0000 mg | Freq: Once | INTRAMUSCULAR | Status: AC
  Administered 2018-08-18: 10 mg via INTRAVENOUS
  Filled 2018-08-18: qty 1

## 2018-08-18 MED ORDER — HYDROMORPHONE HCL 1 MG/ML IJ SOLN
1.0000 mg | Freq: Once | INTRAMUSCULAR | Status: AC
  Administered 2018-08-18: 1 mg via INTRAVENOUS
  Filled 2018-08-18: qty 1

## 2018-08-18 NOTE — ED Triage Notes (Signed)
Pt presents after a syncopal episode. Pt also c/o increasing back pain, hx fx vertebrae, and states he has been losing control of his bowel and bladder in the last 2 days.
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
## 2018-08-18 NOTE — ED Notes (Signed)
Orthostatic vital signs performed in laying and sitting position. Unable to obtain while standing due to pain.

## 2018-08-19 ENCOUNTER — Ambulatory Visit (INDEPENDENT_AMBULATORY_CARE_PROVIDER_SITE_OTHER): Payer: Medicare Other | Admitting: Family Medicine

## 2018-08-19 ENCOUNTER — Other Ambulatory Visit: Payer: Self-pay

## 2018-08-19 ENCOUNTER — Encounter: Payer: Self-pay | Admitting: Family Medicine

## 2018-08-19 DIAGNOSIS — Z8719 Personal history of other diseases of the digestive system: Secondary | ICD-10-CM | POA: Diagnosis not present

## 2018-08-19 DIAGNOSIS — K922 Gastrointestinal hemorrhage, unspecified: Secondary | ICD-10-CM | POA: Diagnosis not present

## 2018-08-19 DIAGNOSIS — Z20828 Contact with and (suspected) exposure to other viral communicable diseases: Secondary | ICD-10-CM | POA: Diagnosis not present

## 2018-08-19 DIAGNOSIS — Z8739 Personal history of other diseases of the musculoskeletal system and connective tissue: Secondary | ICD-10-CM | POA: Diagnosis not present

## 2018-08-19 DIAGNOSIS — R159 Full incontinence of feces: Secondary | ICD-10-CM | POA: Diagnosis not present

## 2018-08-19 DIAGNOSIS — K625 Hemorrhage of anus and rectum: Secondary | ICD-10-CM | POA: Diagnosis not present

## 2018-08-19 DIAGNOSIS — Z89611 Acquired absence of right leg above knee: Secondary | ICD-10-CM | POA: Diagnosis not present

## 2018-08-19 DIAGNOSIS — M545 Low back pain: Secondary | ICD-10-CM | POA: Diagnosis not present

## 2018-08-19 DIAGNOSIS — Z862 Personal history of diseases of the blood and blood-forming organs and certain disorders involving the immune mechanism: Secondary | ICD-10-CM | POA: Diagnosis not present

## 2018-08-19 DIAGNOSIS — R200 Anesthesia of skin: Secondary | ICD-10-CM

## 2018-08-19 DIAGNOSIS — I10 Essential (primary) hypertension: Secondary | ICD-10-CM | POA: Diagnosis not present

## 2018-08-19 LAB — RAPID URINE DRUG SCREEN, HOSP PERFORMED
Amphetamines: NOT DETECTED
Barbiturates: NOT DETECTED
Benzodiazepines: POSITIVE — AB
Cocaine: NOT DETECTED
Opiates: NOT DETECTED
Tetrahydrocannabinol: NOT DETECTED

## 2018-08-19 LAB — URINALYSIS, ROUTINE W REFLEX MICROSCOPIC
Bilirubin Urine: NEGATIVE
Glucose, UA: NEGATIVE mg/dL
Hgb urine dipstick: NEGATIVE
Ketones, ur: NEGATIVE mg/dL
Leukocytes,Ua: NEGATIVE
Nitrite: NEGATIVE
Protein, ur: NEGATIVE mg/dL
Specific Gravity, Urine: 1.013 (ref 1.005–1.030)
pH: 5 (ref 5.0–8.0)
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
## 2018-08-19 NOTE — Patient Instructions (Signed)
ED for evaluation

## 2018-08-19 NOTE — ED Provider Notes (Signed)
Patient seen/examined in the Emergency Department in conjunction with Advanced Practice Provider [facility] Patient reports back pain that he reports he's had on/off for months Exam : somnolent from pain meds, easily arousable, no distress, able to move left LE without difficulty . He has right AKA Plan: treat pain, labs pending, likely d/c.  He had recent negative MRI spine    Ripley Fraise, MD 08/19/18 0007

## 2018-08-19 NOTE — Discharge Instructions (Signed)
We recommend that you take your home prescribed oxycodone for management of pain.  Eat small meals throughout the day to ensure that you can take this medication on a full stomach to lessen side effects of nausea.  You may supplement your oxycodone with Tylenol or ibuprofen.  Avoid strenuous activity and heavy lifting.  Alternate ice and heat to your low back.  Follow-up with your spine center/neurosurgeon for repeat evaluation.  You may see your primary care doctor as needed and follow-up.  Return to the ED for new or concerning symptoms.

## 2018-08-20 DIAGNOSIS — R109 Unspecified abdominal pain: Secondary | ICD-10-CM | POA: Diagnosis not present

## 2018-08-20 DIAGNOSIS — N179 Acute kidney failure, unspecified: Secondary | ICD-10-CM | POA: Diagnosis not present

## 2018-08-20 DIAGNOSIS — K922 Gastrointestinal hemorrhage, unspecified: Secondary | ICD-10-CM | POA: Diagnosis not present

## 2018-08-20 DIAGNOSIS — R1084 Generalized abdominal pain: Secondary | ICD-10-CM | POA: Diagnosis not present

## 2018-08-20 DIAGNOSIS — Z9049 Acquired absence of other specified parts of digestive tract: Secondary | ICD-10-CM | POA: Diagnosis not present

## 2018-08-20 DIAGNOSIS — I10 Essential (primary) hypertension: Secondary | ICD-10-CM | POA: Diagnosis not present

## 2018-08-20 DIAGNOSIS — K625 Hemorrhage of anus and rectum: Secondary | ICD-10-CM | POA: Diagnosis not present

## 2018-08-20 DIAGNOSIS — K921 Melena: Secondary | ICD-10-CM | POA: Diagnosis not present

## 2018-08-20 DIAGNOSIS — R634 Abnormal weight loss: Secondary | ICD-10-CM | POA: Diagnosis not present

## 2018-08-20 MED ORDER — HYDROMORPHONE HCL 1 MG/ML IJ SOLN
0.50 | INTRAMUSCULAR | Status: DC
Start: ? — End: 2018-08-20

## 2018-08-20 MED ORDER — POLYETHYLENE GLYCOL 3350 17 G PO PACK
17.00 | PACK | ORAL | Status: DC
Start: ? — End: 2018-08-20

## 2018-08-20 MED ORDER — ACETAMINOPHEN 325 MG PO TABS
650.00 | ORAL_TABLET | ORAL | Status: DC
Start: ? — End: 2018-08-20

## 2018-08-20 MED ORDER — ALBUTEROL SULFATE (2.5 MG/3ML) 0.083% IN NEBU
2.50 | INHALATION_SOLUTION | RESPIRATORY_TRACT | Status: DC
Start: ? — End: 2018-08-20

## 2018-08-20 MED ORDER — EPINEPHRINE (ANAPHYLAXIS) 30 MG/30ML IJ SOLN
.30 | INTRAMUSCULAR | Status: DC
Start: ? — End: 2018-08-20

## 2018-08-20 MED ORDER — SODIUM CHLORIDE 0.9 % IV SOLN
75.00 | INTRAVENOUS | Status: DC
Start: ? — End: 2018-08-20

## 2018-08-20 MED ORDER — GENERIC EXTERNAL MEDICATION
Status: DC
Start: ? — End: 2018-08-20

## 2018-08-20 MED ORDER — BENZONATATE 100 MG PO CAPS
100.00 | ORAL_CAPSULE | ORAL | Status: DC
Start: ? — End: 2018-08-20

## 2018-08-20 MED ORDER — NITROGLYCERIN 0.4 MG SL SUBL
0.40 | SUBLINGUAL_TABLET | SUBLINGUAL | Status: DC
Start: ? — End: 2018-08-20

## 2018-08-20 MED ORDER — OXYCODONE HCL 15 MG PO TABS
15.00 | ORAL_TABLET | ORAL | Status: DC
Start: ? — End: 2018-08-20

## 2018-08-20 MED ORDER — ALPRAZOLAM 1 MG PO TABS
1.00 | ORAL_TABLET | ORAL | Status: DC
Start: ? — End: 2018-08-20

## 2018-08-20 MED ORDER — PANTOPRAZOLE SODIUM 40 MG IV SOLR
40.00 | INTRAVENOUS | Status: DC
Start: 2018-08-21 — End: 2018-08-20

## 2018-08-21 DIAGNOSIS — K922 Gastrointestinal hemorrhage, unspecified: Secondary | ICD-10-CM | POA: Diagnosis not present

## 2018-08-21 DIAGNOSIS — I10 Essential (primary) hypertension: Secondary | ICD-10-CM | POA: Diagnosis not present

## 2018-08-21 DIAGNOSIS — R634 Abnormal weight loss: Secondary | ICD-10-CM | POA: Diagnosis not present

## 2018-08-21 DIAGNOSIS — K921 Melena: Secondary | ICD-10-CM | POA: Diagnosis not present

## 2018-08-21 DIAGNOSIS — Z765 Malingerer [conscious simulation]: Secondary | ICD-10-CM | POA: Diagnosis not present

## 2018-08-21 DIAGNOSIS — R1084 Generalized abdominal pain: Secondary | ICD-10-CM | POA: Diagnosis not present

## 2018-08-21 DIAGNOSIS — S61512A Laceration without foreign body of left wrist, initial encounter: Secondary | ICD-10-CM | POA: Diagnosis not present

## 2018-08-21 DIAGNOSIS — N179 Acute kidney failure, unspecified: Secondary | ICD-10-CM | POA: Diagnosis not present

## 2018-08-22 DIAGNOSIS — K922 Gastrointestinal hemorrhage, unspecified: Secondary | ICD-10-CM | POA: Diagnosis not present

## 2018-08-22 DIAGNOSIS — R1084 Generalized abdominal pain: Secondary | ICD-10-CM | POA: Diagnosis not present

## 2018-08-22 DIAGNOSIS — Z765 Malingerer [conscious simulation]: Secondary | ICD-10-CM | POA: Diagnosis not present

## 2018-08-22 DIAGNOSIS — R634 Abnormal weight loss: Secondary | ICD-10-CM | POA: Diagnosis not present

## 2018-08-22 DIAGNOSIS — K921 Melena: Secondary | ICD-10-CM | POA: Diagnosis not present

## 2018-08-22 DIAGNOSIS — S61512A Laceration without foreign body of left wrist, initial encounter: Secondary | ICD-10-CM | POA: Diagnosis not present

## 2018-08-22 DIAGNOSIS — I10 Essential (primary) hypertension: Secondary | ICD-10-CM | POA: Diagnosis not present

## 2018-08-22 DIAGNOSIS — N179 Acute kidney failure, unspecified: Secondary | ICD-10-CM | POA: Diagnosis not present

## 2018-08-23 DIAGNOSIS — R4781 Slurred speech: Secondary | ICD-10-CM | POA: Diagnosis not present

## 2018-08-23 DIAGNOSIS — K6389 Other specified diseases of intestine: Secondary | ICD-10-CM | POA: Diagnosis not present

## 2018-08-23 DIAGNOSIS — R1084 Generalized abdominal pain: Secondary | ICD-10-CM | POA: Diagnosis not present

## 2018-08-23 DIAGNOSIS — S61512A Laceration without foreign body of left wrist, initial encounter: Secondary | ICD-10-CM | POA: Diagnosis not present

## 2018-08-23 DIAGNOSIS — I639 Cerebral infarction, unspecified: Secondary | ICD-10-CM | POA: Diagnosis not present

## 2018-08-23 DIAGNOSIS — N179 Acute kidney failure, unspecified: Secondary | ICD-10-CM | POA: Diagnosis not present

## 2018-08-23 DIAGNOSIS — K922 Gastrointestinal hemorrhage, unspecified: Secondary | ICD-10-CM | POA: Diagnosis not present

## 2018-08-23 DIAGNOSIS — I10 Essential (primary) hypertension: Secondary | ICD-10-CM | POA: Diagnosis not present

## 2018-08-23 DIAGNOSIS — Z765 Malingerer [conscious simulation]: Secondary | ICD-10-CM | POA: Diagnosis not present

## 2018-08-24 ENCOUNTER — Telehealth: Payer: Self-pay | Admitting: Family Medicine

## 2018-08-24 DIAGNOSIS — S61512A Laceration without foreign body of left wrist, initial encounter: Secondary | ICD-10-CM | POA: Diagnosis not present

## 2018-08-24 DIAGNOSIS — K922 Gastrointestinal hemorrhage, unspecified: Secondary | ICD-10-CM | POA: Diagnosis not present

## 2018-08-24 DIAGNOSIS — I10 Essential (primary) hypertension: Secondary | ICD-10-CM | POA: Diagnosis not present

## 2018-08-24 DIAGNOSIS — R1084 Generalized abdominal pain: Secondary | ICD-10-CM | POA: Diagnosis not present

## 2018-08-24 DIAGNOSIS — Z765 Malingerer [conscious simulation]: Secondary | ICD-10-CM | POA: Diagnosis not present

## 2018-08-24 DIAGNOSIS — N179 Acute kidney failure, unspecified: Secondary | ICD-10-CM | POA: Diagnosis not present

## 2018-08-24 DIAGNOSIS — R109 Unspecified abdominal pain: Secondary | ICD-10-CM | POA: Diagnosis not present

## 2018-08-25 DIAGNOSIS — N179 Acute kidney failure, unspecified: Secondary | ICD-10-CM | POA: Diagnosis not present

## 2018-08-25 DIAGNOSIS — K922 Gastrointestinal hemorrhage, unspecified: Secondary | ICD-10-CM | POA: Diagnosis not present

## 2018-08-25 DIAGNOSIS — R509 Fever, unspecified: Secondary | ICD-10-CM | POA: Diagnosis not present

## 2018-08-25 DIAGNOSIS — R918 Other nonspecific abnormal finding of lung field: Secondary | ICD-10-CM | POA: Diagnosis not present

## 2018-08-25 DIAGNOSIS — I10 Essential (primary) hypertension: Secondary | ICD-10-CM | POA: Diagnosis not present

## 2018-08-25 DIAGNOSIS — J9811 Atelectasis: Secondary | ICD-10-CM | POA: Diagnosis not present

## 2018-08-25 DIAGNOSIS — S61512A Laceration without foreign body of left wrist, initial encounter: Secondary | ICD-10-CM | POA: Diagnosis not present

## 2018-08-25 DIAGNOSIS — Z765 Malingerer [conscious simulation]: Secondary | ICD-10-CM | POA: Diagnosis not present

## 2018-08-26 DIAGNOSIS — K922 Gastrointestinal hemorrhage, unspecified: Secondary | ICD-10-CM | POA: Diagnosis not present

## 2018-08-26 DIAGNOSIS — I82612 Acute embolism and thrombosis of superficial veins of left upper extremity: Secondary | ICD-10-CM | POA: Diagnosis not present

## 2018-08-27 DIAGNOSIS — K922 Gastrointestinal hemorrhage, unspecified: Secondary | ICD-10-CM | POA: Diagnosis not present

## 2018-08-28 ENCOUNTER — Other Ambulatory Visit: Payer: Self-pay | Admitting: Family Medicine

## 2018-08-28 DIAGNOSIS — K922 Gastrointestinal hemorrhage, unspecified: Secondary | ICD-10-CM | POA: Diagnosis not present

## 2018-08-28 DIAGNOSIS — R600 Localized edema: Secondary | ICD-10-CM | POA: Diagnosis not present

## 2018-08-28 DIAGNOSIS — F5101 Primary insomnia: Secondary | ICD-10-CM

## 2018-08-28 DIAGNOSIS — M25422 Effusion, left elbow: Secondary | ICD-10-CM | POA: Diagnosis not present

## 2018-08-29 DIAGNOSIS — K922 Gastrointestinal hemorrhage, unspecified: Secondary | ICD-10-CM | POA: Diagnosis not present

## 2018-08-29 DIAGNOSIS — I809 Phlebitis and thrombophlebitis of unspecified site: Secondary | ICD-10-CM | POA: Diagnosis not present

## 2018-08-30 DIAGNOSIS — B9689 Other specified bacterial agents as the cause of diseases classified elsewhere: Secondary | ICD-10-CM | POA: Diagnosis not present

## 2018-08-30 DIAGNOSIS — S78111A Complete traumatic amputation at level between right hip and knee, initial encounter: Secondary | ICD-10-CM | POA: Diagnosis not present

## 2018-08-30 DIAGNOSIS — M60022 Infective myositis, left upper arm: Secondary | ICD-10-CM | POA: Diagnosis not present

## 2018-08-30 DIAGNOSIS — R7881 Bacteremia: Secondary | ICD-10-CM | POA: Diagnosis not present

## 2018-08-30 DIAGNOSIS — L02211 Cutaneous abscess of abdominal wall: Secondary | ICD-10-CM | POA: Diagnosis not present

## 2018-08-30 DIAGNOSIS — F6810 Factitious disorder, unspecified: Secondary | ICD-10-CM | POA: Diagnosis not present

## 2018-08-30 DIAGNOSIS — E669 Obesity, unspecified: Secondary | ICD-10-CM | POA: Diagnosis not present

## 2018-08-30 DIAGNOSIS — I809 Phlebitis and thrombophlebitis of unspecified site: Secondary | ICD-10-CM | POA: Diagnosis not present

## 2018-08-30 DIAGNOSIS — K922 Gastrointestinal hemorrhage, unspecified: Secondary | ICD-10-CM | POA: Diagnosis not present

## 2018-08-31 DIAGNOSIS — I809 Phlebitis and thrombophlebitis of unspecified site: Secondary | ICD-10-CM | POA: Diagnosis not present

## 2018-08-31 DIAGNOSIS — B9689 Other specified bacterial agents as the cause of diseases classified elsewhere: Secondary | ICD-10-CM | POA: Diagnosis not present

## 2018-08-31 DIAGNOSIS — Z765 Malingerer [conscious simulation]: Secondary | ICD-10-CM | POA: Diagnosis not present

## 2018-08-31 DIAGNOSIS — N390 Urinary tract infection, site not specified: Secondary | ICD-10-CM | POA: Diagnosis not present

## 2018-08-31 DIAGNOSIS — K922 Gastrointestinal hemorrhage, unspecified: Secondary | ICD-10-CM | POA: Diagnosis not present

## 2018-08-31 DIAGNOSIS — R7881 Bacteremia: Secondary | ICD-10-CM | POA: Diagnosis not present

## 2018-08-31 DIAGNOSIS — F419 Anxiety disorder, unspecified: Secondary | ICD-10-CM | POA: Diagnosis not present

## 2018-08-31 DIAGNOSIS — F6810 Factitious disorder, unspecified: Secondary | ICD-10-CM | POA: Diagnosis not present

## 2018-09-03 ENCOUNTER — Telehealth: Payer: Self-pay | Admitting: Family Medicine

## 2018-09-03 NOTE — Telephone Encounter (Signed)
Pt. Needs to do phone visit for this. Thanks, WS

## 2018-09-03 NOTE — Telephone Encounter (Signed)
Patient aware and appointment scheduled.  

## 2018-09-04 DIAGNOSIS — R600 Localized edema: Secondary | ICD-10-CM | POA: Diagnosis not present

## 2018-09-04 DIAGNOSIS — Z87892 Personal history of anaphylaxis: Secondary | ICD-10-CM | POA: Diagnosis not present

## 2018-09-04 DIAGNOSIS — Z888 Allergy status to other drugs, medicaments and biological substances status: Secondary | ICD-10-CM | POA: Diagnosis not present

## 2018-09-04 DIAGNOSIS — Z79899 Other long term (current) drug therapy: Secondary | ICD-10-CM | POA: Diagnosis not present

## 2018-09-04 DIAGNOSIS — I82622 Acute embolism and thrombosis of deep veins of left upper extremity: Secondary | ICD-10-CM | POA: Diagnosis not present

## 2018-09-04 DIAGNOSIS — Z885 Allergy status to narcotic agent status: Secondary | ICD-10-CM | POA: Diagnosis not present

## 2018-09-04 DIAGNOSIS — M25522 Pain in left elbow: Secondary | ICD-10-CM | POA: Diagnosis not present

## 2018-09-04 DIAGNOSIS — Z880 Allergy status to penicillin: Secondary | ICD-10-CM | POA: Diagnosis not present

## 2018-09-04 DIAGNOSIS — I10 Essential (primary) hypertension: Secondary | ICD-10-CM | POA: Diagnosis not present

## 2018-09-06 ENCOUNTER — Other Ambulatory Visit: Payer: Self-pay | Admitting: Family Medicine

## 2018-09-06 DIAGNOSIS — F5101 Primary insomnia: Secondary | ICD-10-CM

## 2018-09-07 ENCOUNTER — Ambulatory Visit: Payer: Medicare Other | Admitting: Family Medicine

## 2018-09-07 ENCOUNTER — Encounter: Payer: Self-pay | Admitting: Family Medicine

## 2018-09-07 NOTE — Progress Notes (Signed)
No answer X 4. Visit cancelled. LMOM to reschedule. PDMP reviewed. Shows pt. Now getting Zolpidem elsewhere.

## 2018-09-08 ENCOUNTER — Other Ambulatory Visit: Payer: Self-pay | Admitting: Family Medicine

## 2018-09-08 DIAGNOSIS — F5101 Primary insomnia: Secondary | ICD-10-CM

## 2018-09-13 ENCOUNTER — Ambulatory Visit (INDEPENDENT_AMBULATORY_CARE_PROVIDER_SITE_OTHER): Payer: Medicare Other | Admitting: Family Medicine

## 2018-09-13 ENCOUNTER — Other Ambulatory Visit: Payer: Self-pay

## 2018-09-13 ENCOUNTER — Encounter: Payer: Self-pay | Admitting: Family Medicine

## 2018-09-13 DIAGNOSIS — Z79899 Other long term (current) drug therapy: Secondary | ICD-10-CM | POA: Diagnosis not present

## 2018-09-13 DIAGNOSIS — Z9114 Patient's other noncompliance with medication regimen: Secondary | ICD-10-CM | POA: Diagnosis not present

## 2018-09-13 DIAGNOSIS — F1320 Sedative, hypnotic or anxiolytic dependence, uncomplicated: Secondary | ICD-10-CM | POA: Diagnosis not present

## 2018-09-13 NOTE — Progress Notes (Signed)
Subjective:    Patient ID: Jason [name], male    DOB: 26-Oct-1965, 53 y.o.   MRN: [id number]   HPI: Jason [name] is a 53 y.o. male presenting for desire to refill his [place]. He wants to switch back to the regular version instead of the E.R. because it makes him feel groggy the following morning.  But he did not tell me is that he has received 2 prescriptions over the last 2 months from a different provider for this medication.  When questioned he tells me that he just went to the drive-through when they gave him a bag of prescriptions and he just took at home without looking.  However, this does not explain why he took all of the pills.  He tells me instead that he did not look at who prescribed him.  On the other hand, he called in and asked me specifically to make a switch from the old ones the ER, to the requested newer noncontrolled release Ambien.  Asking specifically for change in spite of the fact that he knew that he had been changed to months before.  In addition to that the PDMP shows that he filled tramadol which is also a violation of his contract.  In short the patient has violated his contract 3 different times recently twice by filling Ambien from a different provider and once by filling tramadol, and unauthorized controlled substance.  Because of this he can no longer receive any controlled substances from this office/provider and his chart will be marked as such.  He is welcome to return for noncontrolled substance related issues at this time.  Depression screen [facility] 2/9 05/28/2018 03/22/2018 12/25/2017 11/18/2017 06/26/2017  Decreased Interest 0 0 0 1 0  Down, Depressed, Hopeless 0 0 1 0 1  PHQ - 2 Score 0 0 1 1 1      Relevant past medical, surgical, family and social history reviewed and updated as indicated.  Interim medical history since our last visit reviewed. Allergies and medications reviewed and updated.  ROS:  Review of Systems   Social History   Tobacco Use   Smoking Status Never Smoker  Smokeless Tobacco Never Used       Objective:     Wt Readings from Last 3 Encounters:  05/28/18 204 lb 6 oz (92.7 kg)  03/22/18 204 lb (92.5 kg)  12/25/17 225 lb (102.1 kg)     Exam deferred. Pt. Harboring due to COVID 19. Phone visit performed.   Assessment & Plan:   1. Benzodiazepine dependence, continuous (HCC)   2. Pain management contract broken     No orders of the defined types were placed in this encounter.   No orders of the defined types were placed in this encounter.     Diagnoses and all orders for this visit:  Benzodiazepine dependence, continuous ([place])  Pain management contract broken    Virtual Visit via telephone Note  I discussed the limitations, risks, security and privacy concerns of performing an evaluation and management service by telephone and the availability of in person appointments. The patient was identified with two identifiers. Pt.expressed understanding and agreed to proceed. Pt. Is at home. Dr. Livia Snellen is in his office.  Follow Up Instructions:   I discussed the assessment and treatment plan with the patient. The patient was provided an opportunity to ask questions and all were answered. The patient agreed with the plan and demonstrated an understanding of the instructions.   The patient was  advised to call back or seek an in-person evaluation if the symptoms worsen or if the condition fails to improve as anticipated.   Total minutes including chart review and phone contact time: 15   Follow up plan: Return if symptoms worsen or fail to improve.  Claretta Fraise, MD [place]

## 2018-09-21 ENCOUNTER — Ambulatory Visit: Payer: Medicare Other | Admitting: Family Medicine

## 2018-10-04 DIAGNOSIS — S22080D Wedge compression fracture of T11-T12 vertebra, subsequent encounter for fracture with routine healing: Secondary | ICD-10-CM | POA: Diagnosis not present

## 2018-10-04 DIAGNOSIS — M438X5 Other specified deforming dorsopathies, thoracolumbar region: Secondary | ICD-10-CM | POA: Diagnosis not present

## 2018-10-04 DIAGNOSIS — S22088D Other fracture of T11-T12 vertebra, subsequent encounter for fracture with routine healing: Secondary | ICD-10-CM | POA: Diagnosis not present

## 2018-10-13 DIAGNOSIS — Z79899 Other long term (current) drug therapy: Secondary | ICD-10-CM | POA: Diagnosis not present

## 2018-11-12 DIAGNOSIS — G546 Phantom limb syndrome with pain: Secondary | ICD-10-CM | POA: Diagnosis not present

## 2018-11-12 DIAGNOSIS — T8789 Other complications of amputation stump: Secondary | ICD-10-CM | POA: Diagnosis not present

## 2018-11-12 DIAGNOSIS — Z89611 Acquired absence of right leg above knee: Secondary | ICD-10-CM | POA: Diagnosis not present

## 2018-11-12 DIAGNOSIS — M79606 Pain in leg, unspecified: Secondary | ICD-10-CM | POA: Diagnosis not present

## 2018-11-12 DIAGNOSIS — Z79899 Other long term (current) drug therapy: Secondary | ICD-10-CM | POA: Diagnosis not present

## 2018-12-10 DIAGNOSIS — Z79899 Other long term (current) drug therapy: Secondary | ICD-10-CM | POA: Diagnosis not present

## 2018-12-10 DIAGNOSIS — Z89611 Acquired absence of right leg above knee: Secondary | ICD-10-CM | POA: Diagnosis not present

## 2018-12-10 DIAGNOSIS — G546 Phantom limb syndrome with pain: Secondary | ICD-10-CM | POA: Diagnosis not present

## 2018-12-10 DIAGNOSIS — T8789 Other complications of amputation stump: Secondary | ICD-10-CM | POA: Diagnosis not present

## 2018-12-10 DIAGNOSIS — M79606 Pain in leg, unspecified: Secondary | ICD-10-CM | POA: Diagnosis not present

## 2018-12-26 DIAGNOSIS — Z049 Encounter for examination and observation for unspecified reason: Secondary | ICD-10-CM | POA: Diagnosis not present

## 2018-12-26 DIAGNOSIS — Z79899 Other long term (current) drug therapy: Secondary | ICD-10-CM | POA: Diagnosis not present

## 2018-12-26 DIAGNOSIS — G8911 Acute pain due to trauma: Secondary | ICD-10-CM | POA: Diagnosis not present

## 2018-12-26 DIAGNOSIS — Z885 Allergy status to narcotic agent status: Secondary | ICD-10-CM | POA: Diagnosis not present

## 2018-12-26 DIAGNOSIS — M549 Dorsalgia, unspecified: Secondary | ICD-10-CM | POA: Diagnosis not present

## 2018-12-26 DIAGNOSIS — Z91041 Radiographic dye allergy status: Secondary | ICD-10-CM | POA: Diagnosis not present

## 2018-12-26 DIAGNOSIS — R079 Chest pain, unspecified: Secondary | ICD-10-CM | POA: Diagnosis not present

## 2018-12-26 DIAGNOSIS — R0989 Other specified symptoms and signs involving the circulatory and respiratory systems: Secondary | ICD-10-CM | POA: Diagnosis not present

## 2018-12-26 DIAGNOSIS — Z87892 Personal history of anaphylaxis: Secondary | ICD-10-CM | POA: Diagnosis not present

## 2018-12-26 DIAGNOSIS — R200 Anesthesia of skin: Secondary | ICD-10-CM | POA: Diagnosis not present

## 2018-12-26 DIAGNOSIS — R600 Localized edema: Secondary | ICD-10-CM | POA: Diagnosis not present

## 2018-12-26 DIAGNOSIS — Z791 Long term (current) use of non-steroidal anti-inflammatories (NSAID): Secondary | ICD-10-CM | POA: Diagnosis not present

## 2018-12-26 DIAGNOSIS — Z7901 Long term (current) use of anticoagulants: Secondary | ICD-10-CM | POA: Diagnosis not present

## 2018-12-26 DIAGNOSIS — Z7984 Long term (current) use of oral hypoglycemic drugs: Secondary | ICD-10-CM | POA: Diagnosis not present

## 2018-12-26 DIAGNOSIS — M25552 Pain in left hip: Secondary | ICD-10-CM | POA: Diagnosis not present

## 2018-12-26 DIAGNOSIS — Z880 Allergy status to penicillin: Secondary | ICD-10-CM | POA: Diagnosis not present

## 2018-12-26 DIAGNOSIS — W1830XA Fall on same level, unspecified, initial encounter: Secondary | ICD-10-CM | POA: Diagnosis not present

## 2018-12-26 DIAGNOSIS — Z888 Allergy status to other drugs, medicaments and biological substances status: Secondary | ICD-10-CM | POA: Diagnosis not present

## 2018-12-26 DIAGNOSIS — M25512 Pain in left shoulder: Secondary | ICD-10-CM | POA: Diagnosis not present

## 2018-12-26 DIAGNOSIS — I10 Essential (primary) hypertension: Secondary | ICD-10-CM | POA: Diagnosis not present

## 2018-12-26 DIAGNOSIS — M79652 Pain in left thigh: Secondary | ICD-10-CM | POA: Diagnosis not present

## 2019-01-07 DIAGNOSIS — Z79899 Other long term (current) drug therapy: Secondary | ICD-10-CM | POA: Diagnosis not present

## 2019-01-07 DIAGNOSIS — M79606 Pain in leg, unspecified: Secondary | ICD-10-CM | POA: Diagnosis not present

## 2019-01-07 DIAGNOSIS — T8789 Other complications of amputation stump: Secondary | ICD-10-CM | POA: Diagnosis not present

## 2019-01-07 DIAGNOSIS — G546 Phantom limb syndrome with pain: Secondary | ICD-10-CM | POA: Diagnosis not present

## 2019-01-16 DIAGNOSIS — R160 Hepatomegaly, not elsewhere classified: Secondary | ICD-10-CM | POA: Diagnosis not present

## 2019-01-16 DIAGNOSIS — R569 Unspecified convulsions: Secondary | ICD-10-CM | POA: Diagnosis not present

## 2019-01-16 DIAGNOSIS — M542 Cervicalgia: Secondary | ICD-10-CM | POA: Diagnosis not present

## 2019-01-16 DIAGNOSIS — Z043 Encounter for examination and observation following other accident: Secondary | ICD-10-CM | POA: Diagnosis not present

## 2019-01-16 DIAGNOSIS — Y999 Unspecified external cause status: Secondary | ICD-10-CM | POA: Diagnosis not present

## 2019-01-16 DIAGNOSIS — R4189 Other symptoms and signs involving cognitive functions and awareness: Secondary | ICD-10-CM | POA: Diagnosis not present

## 2019-01-16 DIAGNOSIS — W1830XA Fall on same level, unspecified, initial encounter: Secondary | ICD-10-CM | POA: Diagnosis not present

## 2019-01-16 DIAGNOSIS — Z5329 Procedure and treatment not carried out because of patient's decision for other reasons: Secondary | ICD-10-CM | POA: Diagnosis not present

## 2019-01-16 DIAGNOSIS — M79642 Pain in left hand: Secondary | ICD-10-CM | POA: Diagnosis not present

## 2019-01-16 DIAGNOSIS — W19XXXA Unspecified fall, initial encounter: Secondary | ICD-10-CM | POA: Diagnosis not present

## 2019-01-16 DIAGNOSIS — R079 Chest pain, unspecified: Secondary | ICD-10-CM | POA: Diagnosis not present

## 2019-01-16 DIAGNOSIS — S0001XA Abrasion of scalp, initial encounter: Secondary | ICD-10-CM | POA: Diagnosis not present

## 2019-01-16 DIAGNOSIS — M25532 Pain in left wrist: Secondary | ICD-10-CM | POA: Diagnosis not present

## 2019-01-16 DIAGNOSIS — Z9181 History of falling: Secondary | ICD-10-CM | POA: Diagnosis not present

## 2019-01-16 DIAGNOSIS — R55 Syncope and collapse: Secondary | ICD-10-CM | POA: Diagnosis not present

## 2019-01-16 DIAGNOSIS — M25522 Pain in left elbow: Secondary | ICD-10-CM | POA: Diagnosis not present

## 2019-01-16 DIAGNOSIS — S32028A Other fracture of second lumbar vertebra, initial encounter for closed fracture: Secondary | ICD-10-CM | POA: Diagnosis not present

## 2019-01-16 DIAGNOSIS — R519 Headache, unspecified: Secondary | ICD-10-CM | POA: Diagnosis not present

## 2019-01-16 DIAGNOSIS — Z049 Encounter for examination and observation for unspecified reason: Secondary | ICD-10-CM | POA: Diagnosis not present

## 2019-01-16 DIAGNOSIS — G40909 Epilepsy, unspecified, not intractable, without status epilepticus: Secondary | ICD-10-CM | POA: Diagnosis not present

## 2019-01-16 DIAGNOSIS — W010XXA Fall on same level from slipping, tripping and stumbling without subsequent striking against object, initial encounter: Secondary | ICD-10-CM | POA: Diagnosis not present

## 2019-01-16 DIAGNOSIS — S0990XA Unspecified injury of head, initial encounter: Secondary | ICD-10-CM | POA: Diagnosis not present

## 2019-01-16 DIAGNOSIS — R531 Weakness: Secondary | ICD-10-CM | POA: Diagnosis not present

## 2019-01-17 DIAGNOSIS — I499 Cardiac arrhythmia, unspecified: Secondary | ICD-10-CM | POA: Diagnosis not present

## 2019-01-26 DIAGNOSIS — R4020 Unspecified coma: Secondary | ICD-10-CM | POA: Diagnosis not present

## 2019-01-26 DIAGNOSIS — R569 Unspecified convulsions: Secondary | ICD-10-CM | POA: Diagnosis not present

## 2019-01-26 DIAGNOSIS — G40909 Epilepsy, unspecified, not intractable, without status epilepticus: Secondary | ICD-10-CM | POA: Diagnosis not present

## 2019-01-26 DIAGNOSIS — I10 Essential (primary) hypertension: Secondary | ICD-10-CM | POA: Diagnosis not present

## 2019-01-26 DIAGNOSIS — G4089 Other seizures: Secondary | ICD-10-CM | POA: Diagnosis not present

## 2019-01-26 DIAGNOSIS — Z79899 Other long term (current) drug therapy: Secondary | ICD-10-CM | POA: Diagnosis not present

## 2019-01-26 DIAGNOSIS — Z765 Malingerer [conscious simulation]: Secondary | ICD-10-CM | POA: Diagnosis not present

## 2019-01-26 DIAGNOSIS — S0990XA Unspecified injury of head, initial encounter: Secondary | ICD-10-CM | POA: Diagnosis not present

## 2019-01-26 DIAGNOSIS — R404 Transient alteration of awareness: Secondary | ICD-10-CM | POA: Diagnosis not present

## 2019-01-26 DIAGNOSIS — S199XXA Unspecified injury of neck, initial encounter: Secondary | ICD-10-CM | POA: Diagnosis not present

## 2019-01-26 DIAGNOSIS — F445 Conversion disorder with seizures or convulsions: Secondary | ICD-10-CM | POA: Diagnosis not present

## 2019-01-26 DIAGNOSIS — I2510 Atherosclerotic heart disease of native coronary artery without angina pectoris: Secondary | ICD-10-CM | POA: Diagnosis not present

## 2019-01-26 DIAGNOSIS — R0689 Other abnormalities of breathing: Secondary | ICD-10-CM | POA: Diagnosis not present

## 2019-01-26 DIAGNOSIS — F4310 Post-traumatic stress disorder, unspecified: Secondary | ICD-10-CM | POA: Diagnosis not present

## 2019-01-27 DIAGNOSIS — R569 Unspecified convulsions: Secondary | ICD-10-CM | POA: Diagnosis not present

## 2019-01-27 DIAGNOSIS — S199XXA Unspecified injury of neck, initial encounter: Secondary | ICD-10-CM | POA: Diagnosis not present

## 2019-01-27 DIAGNOSIS — F445 Conversion disorder with seizures or convulsions: Secondary | ICD-10-CM | POA: Diagnosis not present

## 2019-01-27 DIAGNOSIS — S0990XA Unspecified injury of head, initial encounter: Secondary | ICD-10-CM | POA: Diagnosis not present

## 2019-01-27 DIAGNOSIS — I2510 Atherosclerotic heart disease of native coronary artery without angina pectoris: Secondary | ICD-10-CM | POA: Diagnosis not present

## 2019-01-28 DIAGNOSIS — Z955 Presence of coronary angioplasty implant and graft: Secondary | ICD-10-CM | POA: Diagnosis not present

## 2019-01-28 DIAGNOSIS — I10 Essential (primary) hypertension: Secondary | ICD-10-CM | POA: Diagnosis present

## 2019-01-28 DIAGNOSIS — Z79899 Other long term (current) drug therapy: Secondary | ICD-10-CM | POA: Diagnosis not present

## 2019-01-28 DIAGNOSIS — Z8673 Personal history of transient ischemic attack (TIA), and cerebral infarction without residual deficits: Secondary | ICD-10-CM | POA: Diagnosis not present

## 2019-01-28 DIAGNOSIS — I2510 Atherosclerotic heart disease of native coronary artery without angina pectoris: Secondary | ICD-10-CM | POA: Diagnosis present

## 2019-01-28 DIAGNOSIS — F4310 Post-traumatic stress disorder, unspecified: Secondary | ICD-10-CM | POA: Diagnosis present

## 2019-01-28 DIAGNOSIS — Z765 Malingerer [conscious simulation]: Secondary | ICD-10-CM | POA: Diagnosis not present

## 2019-01-28 DIAGNOSIS — F445 Conversion disorder with seizures or convulsions: Secondary | ICD-10-CM | POA: Diagnosis present

## 2019-01-28 DIAGNOSIS — R0602 Shortness of breath: Secondary | ICD-10-CM | POA: Diagnosis not present

## 2019-01-28 DIAGNOSIS — R7989 Other specified abnormal findings of blood chemistry: Secondary | ICD-10-CM | POA: Diagnosis not present

## 2019-01-28 DIAGNOSIS — S0990XA Unspecified injury of head, initial encounter: Secondary | ICD-10-CM | POA: Diagnosis not present

## 2019-01-28 DIAGNOSIS — Z89619 Acquired absence of unspecified leg above knee: Secondary | ICD-10-CM | POA: Diagnosis not present

## 2019-01-28 DIAGNOSIS — Z888 Allergy status to other drugs, medicaments and biological substances status: Secondary | ICD-10-CM | POA: Diagnosis not present

## 2019-01-28 DIAGNOSIS — R569 Unspecified convulsions: Secondary | ICD-10-CM | POA: Diagnosis not present

## 2019-02-04 DIAGNOSIS — T8789 Other complications of amputation stump: Secondary | ICD-10-CM | POA: Diagnosis not present

## 2019-02-04 DIAGNOSIS — Z79899 Other long term (current) drug therapy: Secondary | ICD-10-CM | POA: Diagnosis not present

## 2019-02-04 DIAGNOSIS — G546 Phantom limb syndrome with pain: Secondary | ICD-10-CM | POA: Diagnosis not present

## 2019-02-04 DIAGNOSIS — M79606 Pain in leg, unspecified: Secondary | ICD-10-CM | POA: Diagnosis not present

## 2019-02-08 DIAGNOSIS — S0083XA Contusion of other part of head, initial encounter: Secondary | ICD-10-CM | POA: Diagnosis not present

## 2019-02-08 DIAGNOSIS — R531 Weakness: Secondary | ICD-10-CM | POA: Diagnosis not present

## 2019-02-08 DIAGNOSIS — W01198A Fall on same level from slipping, tripping and stumbling with subsequent striking against other object, initial encounter: Secondary | ICD-10-CM | POA: Diagnosis not present

## 2019-02-08 DIAGNOSIS — I82722 Chronic embolism and thrombosis of deep veins of left upper extremity: Secondary | ICD-10-CM | POA: Diagnosis not present

## 2019-02-08 DIAGNOSIS — G5771 Causalgia of right lower limb: Secondary | ICD-10-CM | POA: Diagnosis present

## 2019-02-08 DIAGNOSIS — Z91041 Radiographic dye allergy status: Secondary | ICD-10-CM | POA: Diagnosis not present

## 2019-02-08 DIAGNOSIS — R569 Unspecified convulsions: Secondary | ICD-10-CM | POA: Diagnosis not present

## 2019-02-08 DIAGNOSIS — I82611 Acute embolism and thrombosis of superficial veins of right upper extremity: Secondary | ICD-10-CM | POA: Diagnosis not present

## 2019-02-08 DIAGNOSIS — M1990 Unspecified osteoarthritis, unspecified site: Secondary | ICD-10-CM | POA: Diagnosis present

## 2019-02-08 DIAGNOSIS — K219 Gastro-esophageal reflux disease without esophagitis: Secondary | ICD-10-CM | POA: Diagnosis not present

## 2019-02-08 DIAGNOSIS — E669 Obesity, unspecified: Secondary | ICD-10-CM | POA: Diagnosis present

## 2019-02-08 DIAGNOSIS — R4182 Altered mental status, unspecified: Secondary | ICD-10-CM | POA: Diagnosis not present

## 2019-02-08 DIAGNOSIS — Z23 Encounter for immunization: Secondary | ICD-10-CM | POA: Diagnosis not present

## 2019-02-08 DIAGNOSIS — G90521 Complex regional pain syndrome I of right lower limb: Secondary | ICD-10-CM | POA: Diagnosis not present

## 2019-02-08 DIAGNOSIS — Z880 Allergy status to penicillin: Secondary | ICD-10-CM | POA: Diagnosis not present

## 2019-02-08 DIAGNOSIS — F4310 Post-traumatic stress disorder, unspecified: Secondary | ICD-10-CM | POA: Diagnosis present

## 2019-02-08 DIAGNOSIS — R561 Post traumatic seizures: Secondary | ICD-10-CM | POA: Diagnosis not present

## 2019-02-08 DIAGNOSIS — E559 Vitamin D deficiency, unspecified: Secondary | ICD-10-CM | POA: Diagnosis present

## 2019-02-08 DIAGNOSIS — I10 Essential (primary) hypertension: Secondary | ICD-10-CM | POA: Diagnosis not present

## 2019-02-08 DIAGNOSIS — E538 Deficiency of other specified B group vitamins: Secondary | ICD-10-CM | POA: Diagnosis present

## 2019-02-08 DIAGNOSIS — Z049 Encounter for examination and observation for unspecified reason: Secondary | ICD-10-CM | POA: Diagnosis not present

## 2019-02-08 DIAGNOSIS — W1830XA Fall on same level, unspecified, initial encounter: Secondary | ICD-10-CM | POA: Diagnosis not present

## 2019-02-08 DIAGNOSIS — Z89611 Acquired absence of right leg above knee: Secondary | ICD-10-CM | POA: Diagnosis not present

## 2019-02-08 DIAGNOSIS — Z888 Allergy status to other drugs, medicaments and biological substances status: Secondary | ICD-10-CM | POA: Diagnosis not present

## 2019-02-08 DIAGNOSIS — Z79899 Other long term (current) drug therapy: Secondary | ICD-10-CM | POA: Diagnosis not present

## 2019-02-08 DIAGNOSIS — F449 Dissociative and conversion disorder, unspecified: Secondary | ICD-10-CM | POA: Diagnosis not present

## 2019-02-08 DIAGNOSIS — G894 Chronic pain syndrome: Secondary | ICD-10-CM | POA: Diagnosis not present

## 2019-02-08 DIAGNOSIS — Z9181 History of falling: Secondary | ICD-10-CM | POA: Diagnosis not present

## 2019-02-08 DIAGNOSIS — K589 Irritable bowel syndrome without diarrhea: Secondary | ICD-10-CM | POA: Diagnosis not present

## 2019-02-08 DIAGNOSIS — Z91048 Other nonmedicinal substance allergy status: Secondary | ICD-10-CM | POA: Diagnosis not present

## 2019-02-08 DIAGNOSIS — Z86718 Personal history of other venous thrombosis and embolism: Secondary | ICD-10-CM | POA: Diagnosis not present

## 2019-02-08 DIAGNOSIS — R0602 Shortness of breath: Secondary | ICD-10-CM | POA: Diagnosis not present

## 2019-02-08 DIAGNOSIS — Z881 Allergy status to other antibiotic agents status: Secondary | ICD-10-CM | POA: Diagnosis not present

## 2019-02-08 DIAGNOSIS — Z885 Allergy status to narcotic agent status: Secondary | ICD-10-CM | POA: Diagnosis not present

## 2019-02-08 DIAGNOSIS — Z7901 Long term (current) use of anticoagulants: Secondary | ICD-10-CM | POA: Diagnosis not present

## 2019-02-14 DIAGNOSIS — G8911 Acute pain due to trauma: Secondary | ICD-10-CM | POA: Diagnosis not present

## 2019-02-14 DIAGNOSIS — R1110 Vomiting, unspecified: Secondary | ICD-10-CM | POA: Diagnosis not present

## 2019-02-14 DIAGNOSIS — T189XXA Foreign body of alimentary tract, part unspecified, initial encounter: Secondary | ICD-10-CM | POA: Diagnosis not present

## 2019-02-14 DIAGNOSIS — S00512A Abrasion of oral cavity, initial encounter: Secondary | ICD-10-CM | POA: Diagnosis not present

## 2019-02-14 DIAGNOSIS — T182XXA Foreign body in stomach, initial encounter: Secondary | ICD-10-CM | POA: Diagnosis not present

## 2019-02-14 DIAGNOSIS — R918 Other nonspecific abnormal finding of lung field: Secondary | ICD-10-CM | POA: Diagnosis not present

## 2019-02-14 DIAGNOSIS — R079 Chest pain, unspecified: Secondary | ICD-10-CM | POA: Diagnosis not present

## 2019-02-15 DIAGNOSIS — F4521 Hypochondriasis: Secondary | ICD-10-CM | POA: Diagnosis not present

## 2019-02-15 DIAGNOSIS — F6810 Factitious disorder, unspecified: Secondary | ICD-10-CM | POA: Diagnosis not present

## 2019-02-15 DIAGNOSIS — R1110 Vomiting, unspecified: Secondary | ICD-10-CM | POA: Diagnosis not present

## 2019-02-15 DIAGNOSIS — F411 Generalized anxiety disorder: Secondary | ICD-10-CM | POA: Diagnosis not present

## 2019-02-15 DIAGNOSIS — F4310 Post-traumatic stress disorder, unspecified: Secondary | ICD-10-CM | POA: Diagnosis not present

## 2019-02-16 DIAGNOSIS — R109 Unspecified abdominal pain: Secondary | ICD-10-CM | POA: Diagnosis not present

## 2019-02-16 DIAGNOSIS — S30851A Superficial foreign body of abdominal wall, initial encounter: Secondary | ICD-10-CM | POA: Diagnosis not present

## 2019-02-16 DIAGNOSIS — T189XXA Foreign body of alimentary tract, part unspecified, initial encounter: Secondary | ICD-10-CM | POA: Diagnosis not present

## 2019-02-17 DIAGNOSIS — S30851A Superficial foreign body of abdominal wall, initial encounter: Secondary | ICD-10-CM | POA: Diagnosis not present

## 2019-02-17 DIAGNOSIS — Z9049 Acquired absence of other specified parts of digestive tract: Secondary | ICD-10-CM | POA: Diagnosis not present

## 2019-02-17 DIAGNOSIS — R109 Unspecified abdominal pain: Secondary | ICD-10-CM | POA: Diagnosis not present

## 2019-02-18 DIAGNOSIS — Z9049 Acquired absence of other specified parts of digestive tract: Secondary | ICD-10-CM | POA: Diagnosis not present

## 2019-02-23 DIAGNOSIS — I10 Essential (primary) hypertension: Secondary | ICD-10-CM | POA: Diagnosis not present

## 2019-02-23 DIAGNOSIS — Z888 Allergy status to other drugs, medicaments and biological substances status: Secondary | ICD-10-CM | POA: Diagnosis not present

## 2019-02-23 DIAGNOSIS — W1830XA Fall on same level, unspecified, initial encounter: Secondary | ICD-10-CM | POA: Diagnosis not present

## 2019-02-23 DIAGNOSIS — Z881 Allergy status to other antibiotic agents status: Secondary | ICD-10-CM | POA: Diagnosis not present

## 2019-02-23 DIAGNOSIS — R531 Weakness: Secondary | ICD-10-CM | POA: Diagnosis not present

## 2019-02-23 DIAGNOSIS — Z79899 Other long term (current) drug therapy: Secondary | ICD-10-CM | POA: Diagnosis not present

## 2019-02-23 DIAGNOSIS — Z880 Allergy status to penicillin: Secondary | ICD-10-CM | POA: Diagnosis not present

## 2019-02-23 DIAGNOSIS — R55 Syncope and collapse: Secondary | ICD-10-CM | POA: Diagnosis not present

## 2019-02-23 DIAGNOSIS — Z049 Encounter for examination and observation for unspecified reason: Secondary | ICD-10-CM | POA: Diagnosis not present

## 2019-02-27 DIAGNOSIS — Z049 Encounter for examination and observation for unspecified reason: Secondary | ICD-10-CM | POA: Diagnosis not present

## 2019-02-27 DIAGNOSIS — E860 Dehydration: Secondary | ICD-10-CM | POA: Diagnosis not present

## 2019-02-27 DIAGNOSIS — I10 Essential (primary) hypertension: Secondary | ICD-10-CM | POA: Diagnosis not present

## 2019-02-27 DIAGNOSIS — N390 Urinary tract infection, site not specified: Secondary | ICD-10-CM | POA: Diagnosis not present

## 2019-02-27 DIAGNOSIS — R079 Chest pain, unspecified: Secondary | ICD-10-CM | POA: Diagnosis not present

## 2019-02-27 DIAGNOSIS — E872 Acidosis: Secondary | ICD-10-CM | POA: Diagnosis not present

## 2019-02-27 DIAGNOSIS — F419 Anxiety disorder, unspecified: Secondary | ICD-10-CM | POA: Diagnosis not present

## 2019-02-27 DIAGNOSIS — R000 Tachycardia, unspecified: Secondary | ICD-10-CM | POA: Diagnosis not present

## 2019-02-27 DIAGNOSIS — F6810 Factitious disorder, unspecified: Secondary | ICD-10-CM | POA: Diagnosis not present

## 2019-02-28 DIAGNOSIS — Z8619 Personal history of other infectious and parasitic diseases: Secondary | ICD-10-CM | POA: Diagnosis not present

## 2019-02-28 DIAGNOSIS — Z888 Allergy status to other drugs, medicaments and biological substances status: Secondary | ICD-10-CM | POA: Diagnosis not present

## 2019-02-28 DIAGNOSIS — F419 Anxiety disorder, unspecified: Secondary | ICD-10-CM | POA: Diagnosis present

## 2019-02-28 DIAGNOSIS — Z79899 Other long term (current) drug therapy: Secondary | ICD-10-CM | POA: Diagnosis not present

## 2019-02-28 DIAGNOSIS — Z89611 Acquired absence of right leg above knee: Secondary | ICD-10-CM | POA: Diagnosis not present

## 2019-02-28 DIAGNOSIS — I10 Essential (primary) hypertension: Secondary | ICD-10-CM | POA: Diagnosis present

## 2019-02-28 DIAGNOSIS — N390 Urinary tract infection, site not specified: Secondary | ICD-10-CM | POA: Diagnosis present

## 2019-02-28 DIAGNOSIS — F6810 Factitious disorder, unspecified: Secondary | ICD-10-CM | POA: Diagnosis present

## 2019-02-28 DIAGNOSIS — E860 Dehydration: Secondary | ICD-10-CM | POA: Diagnosis present

## 2019-02-28 DIAGNOSIS — Z91048 Other nonmedicinal substance allergy status: Secondary | ICD-10-CM | POA: Diagnosis not present

## 2019-02-28 DIAGNOSIS — M1990 Unspecified osteoarthritis, unspecified site: Secondary | ICD-10-CM | POA: Diagnosis present

## 2019-02-28 DIAGNOSIS — Z885 Allergy status to narcotic agent status: Secondary | ICD-10-CM | POA: Diagnosis not present

## 2019-02-28 DIAGNOSIS — K589 Irritable bowel syndrome without diarrhea: Secondary | ICD-10-CM | POA: Diagnosis present

## 2019-02-28 DIAGNOSIS — Z880 Allergy status to penicillin: Secondary | ICD-10-CM | POA: Diagnosis not present

## 2019-02-28 DIAGNOSIS — R079 Chest pain, unspecified: Secondary | ICD-10-CM | POA: Diagnosis not present

## 2019-02-28 DIAGNOSIS — Z7901 Long term (current) use of anticoagulants: Secondary | ICD-10-CM | POA: Diagnosis not present

## 2019-02-28 DIAGNOSIS — G2581 Restless legs syndrome: Secondary | ICD-10-CM | POA: Diagnosis present

## 2019-02-28 DIAGNOSIS — G546 Phantom limb syndrome with pain: Secondary | ICD-10-CM | POA: Diagnosis present

## 2019-02-28 DIAGNOSIS — E872 Acidosis: Secondary | ICD-10-CM | POA: Diagnosis present

## 2019-02-28 DIAGNOSIS — Z86718 Personal history of other venous thrombosis and embolism: Secondary | ICD-10-CM | POA: Diagnosis not present

## 2019-02-28 DIAGNOSIS — Z881 Allergy status to other antibiotic agents status: Secondary | ICD-10-CM | POA: Diagnosis not present

## 2019-03-24 DIAGNOSIS — T8789 Other complications of amputation stump: Secondary | ICD-10-CM | POA: Diagnosis not present

## 2019-03-24 DIAGNOSIS — Z1159 Encounter for screening for other viral diseases: Secondary | ICD-10-CM | POA: Diagnosis not present

## 2019-03-24 DIAGNOSIS — Z79899 Other long term (current) drug therapy: Secondary | ICD-10-CM | POA: Diagnosis not present

## 2019-03-24 DIAGNOSIS — G546 Phantom limb syndrome with pain: Secondary | ICD-10-CM | POA: Diagnosis not present

## 2019-03-24 DIAGNOSIS — M79606 Pain in leg, unspecified: Secondary | ICD-10-CM | POA: Diagnosis not present

## 2019-03-24 DIAGNOSIS — R110 Nausea: Secondary | ICD-10-CM | POA: Diagnosis not present

## 2019-04-08 DIAGNOSIS — I82622 Acute embolism and thrombosis of deep veins of left upper extremity: Secondary | ICD-10-CM | POA: Diagnosis not present

## 2019-04-08 DIAGNOSIS — Z7901 Long term (current) use of anticoagulants: Secondary | ICD-10-CM | POA: Diagnosis not present

## 2019-04-08 DIAGNOSIS — R918 Other nonspecific abnormal finding of lung field: Secondary | ICD-10-CM | POA: Diagnosis not present

## 2019-04-08 DIAGNOSIS — Z881 Allergy status to other antibiotic agents status: Secondary | ICD-10-CM | POA: Diagnosis not present

## 2019-04-08 DIAGNOSIS — I82890 Acute embolism and thrombosis of other specified veins: Secondary | ICD-10-CM | POA: Diagnosis not present

## 2019-04-08 DIAGNOSIS — I10 Essential (primary) hypertension: Secondary | ICD-10-CM | POA: Diagnosis not present

## 2019-04-08 DIAGNOSIS — Z79899 Other long term (current) drug therapy: Secondary | ICD-10-CM | POA: Diagnosis not present

## 2019-04-08 DIAGNOSIS — Z888 Allergy status to other drugs, medicaments and biological substances status: Secondary | ICD-10-CM | POA: Diagnosis not present

## 2019-04-08 DIAGNOSIS — R079 Chest pain, unspecified: Secondary | ICD-10-CM | POA: Diagnosis not present

## 2019-04-08 DIAGNOSIS — Z880 Allergy status to penicillin: Secondary | ICD-10-CM | POA: Diagnosis not present

## 2019-04-08 DIAGNOSIS — M79604 Pain in right leg: Secondary | ICD-10-CM | POA: Diagnosis not present

## 2019-04-18 DIAGNOSIS — F411 Generalized anxiety disorder: Secondary | ICD-10-CM | POA: Diagnosis not present

## 2019-04-18 DIAGNOSIS — F339 Major depressive disorder, recurrent, unspecified: Secondary | ICD-10-CM | POA: Diagnosis not present

## 2019-04-18 DIAGNOSIS — G4709 Other insomnia: Secondary | ICD-10-CM | POA: Diagnosis not present

## 2019-04-21 DIAGNOSIS — I10 Essential (primary) hypertension: Secondary | ICD-10-CM | POA: Diagnosis not present

## 2019-04-21 DIAGNOSIS — Z880 Allergy status to penicillin: Secondary | ICD-10-CM | POA: Diagnosis not present

## 2019-04-21 DIAGNOSIS — Z888 Allergy status to other drugs, medicaments and biological substances status: Secondary | ICD-10-CM | POA: Diagnosis not present

## 2019-04-21 DIAGNOSIS — Z9109 Other allergy status, other than to drugs and biological substances: Secondary | ICD-10-CM | POA: Diagnosis not present

## 2019-04-21 DIAGNOSIS — R079 Chest pain, unspecified: Secondary | ICD-10-CM | POA: Diagnosis not present

## 2019-04-21 DIAGNOSIS — M1711 Unilateral primary osteoarthritis, right knee: Secondary | ICD-10-CM | POA: Diagnosis not present

## 2019-04-21 DIAGNOSIS — Z881 Allergy status to other antibiotic agents status: Secondary | ICD-10-CM | POA: Diagnosis not present

## 2019-04-21 DIAGNOSIS — M79605 Pain in left leg: Secondary | ICD-10-CM | POA: Diagnosis not present

## 2019-04-21 DIAGNOSIS — R918 Other nonspecific abnormal finding of lung field: Secondary | ICD-10-CM | POA: Diagnosis not present

## 2019-04-21 DIAGNOSIS — Z885 Allergy status to narcotic agent status: Secondary | ICD-10-CM | POA: Diagnosis not present

## 2019-04-21 DIAGNOSIS — R000 Tachycardia, unspecified: Secondary | ICD-10-CM | POA: Diagnosis not present

## 2019-04-21 DIAGNOSIS — Z79899 Other long term (current) drug therapy: Secondary | ICD-10-CM | POA: Diagnosis not present

## 2019-04-21 DIAGNOSIS — Z7901 Long term (current) use of anticoagulants: Secondary | ICD-10-CM | POA: Diagnosis not present

## 2019-04-21 DIAGNOSIS — M79602 Pain in left arm: Secondary | ICD-10-CM | POA: Diagnosis not present

## 2019-04-21 DIAGNOSIS — Z9861 Coronary angioplasty status: Secondary | ICD-10-CM | POA: Diagnosis not present

## 2019-04-21 DIAGNOSIS — Z89611 Acquired absence of right leg above knee: Secondary | ICD-10-CM | POA: Diagnosis not present

## 2019-04-21 DIAGNOSIS — I82622 Acute embolism and thrombosis of deep veins of left upper extremity: Secondary | ICD-10-CM | POA: Diagnosis not present

## 2019-04-22 DIAGNOSIS — G8929 Other chronic pain: Secondary | ICD-10-CM | POA: Diagnosis not present

## 2019-04-22 DIAGNOSIS — M79606 Pain in leg, unspecified: Secondary | ICD-10-CM | POA: Diagnosis not present

## 2019-04-22 DIAGNOSIS — Z79899 Other long term (current) drug therapy: Secondary | ICD-10-CM | POA: Diagnosis not present

## 2019-04-22 DIAGNOSIS — T8789 Other complications of amputation stump: Secondary | ICD-10-CM | POA: Diagnosis not present

## 2019-04-22 DIAGNOSIS — G546 Phantom limb syndrome with pain: Secondary | ICD-10-CM | POA: Diagnosis not present

## 2019-04-25 DIAGNOSIS — I8290 Acute embolism and thrombosis of unspecified vein: Secondary | ICD-10-CM | POA: Diagnosis not present

## 2019-04-25 DIAGNOSIS — Z89611 Acquired absence of right leg above knee: Secondary | ICD-10-CM | POA: Diagnosis not present

## 2019-04-25 DIAGNOSIS — M6281 Muscle weakness (generalized): Secondary | ICD-10-CM | POA: Diagnosis not present

## 2019-04-25 DIAGNOSIS — R404 Transient alteration of awareness: Secondary | ICD-10-CM | POA: Diagnosis not present

## 2019-04-25 DIAGNOSIS — R230 Cyanosis: Secondary | ICD-10-CM | POA: Diagnosis not present

## 2019-04-25 DIAGNOSIS — R519 Headache, unspecified: Secondary | ICD-10-CM | POA: Diagnosis not present

## 2019-04-25 DIAGNOSIS — R29898 Other symptoms and signs involving the musculoskeletal system: Secondary | ICD-10-CM | POA: Diagnosis not present

## 2019-04-25 DIAGNOSIS — Z7901 Long term (current) use of anticoagulants: Secondary | ICD-10-CM | POA: Diagnosis not present

## 2019-04-25 DIAGNOSIS — G546 Phantom limb syndrome with pain: Secondary | ICD-10-CM | POA: Diagnosis not present

## 2019-04-25 DIAGNOSIS — R200 Anesthesia of skin: Secondary | ICD-10-CM | POA: Diagnosis not present

## 2019-04-25 DIAGNOSIS — Z86718 Personal history of other venous thrombosis and embolism: Secondary | ICD-10-CM | POA: Diagnosis not present

## 2019-04-25 DIAGNOSIS — I10 Essential (primary) hypertension: Secondary | ICD-10-CM | POA: Diagnosis not present

## 2019-04-25 DIAGNOSIS — G43909 Migraine, unspecified, not intractable, without status migrainosus: Secondary | ICD-10-CM | POA: Diagnosis not present

## 2019-04-25 DIAGNOSIS — R471 Dysarthria and anarthria: Secondary | ICD-10-CM | POA: Diagnosis not present

## 2019-04-25 DIAGNOSIS — G4489 Other headache syndrome: Secondary | ICD-10-CM | POA: Diagnosis not present

## 2019-04-25 DIAGNOSIS — E559 Vitamin D deficiency, unspecified: Secondary | ICD-10-CM | POA: Diagnosis not present

## 2019-04-25 DIAGNOSIS — R4701 Aphasia: Secondary | ICD-10-CM | POA: Diagnosis not present

## 2019-04-25 DIAGNOSIS — R4781 Slurred speech: Secondary | ICD-10-CM | POA: Diagnosis not present

## 2019-04-25 DIAGNOSIS — F445 Conversion disorder with seizures or convulsions: Secondary | ICD-10-CM | POA: Diagnosis not present

## 2019-04-25 DIAGNOSIS — R569 Unspecified convulsions: Secondary | ICD-10-CM | POA: Diagnosis not present

## 2019-04-25 DIAGNOSIS — R110 Nausea: Secondary | ICD-10-CM | POA: Diagnosis not present

## 2019-04-25 DIAGNOSIS — R531 Weakness: Secondary | ICD-10-CM | POA: Diagnosis not present

## 2019-04-25 DIAGNOSIS — R52 Pain, unspecified: Secondary | ICD-10-CM | POA: Diagnosis not present

## 2019-04-25 DIAGNOSIS — Z765 Malingerer [conscious simulation]: Secondary | ICD-10-CM | POA: Diagnosis not present

## 2019-04-25 DIAGNOSIS — Z20822 Contact with and (suspected) exposure to covid-19: Secondary | ICD-10-CM | POA: Diagnosis not present

## 2019-04-26 DIAGNOSIS — Z91048 Other nonmedicinal substance allergy status: Secondary | ICD-10-CM | POA: Diagnosis not present

## 2019-04-26 DIAGNOSIS — Z86718 Personal history of other venous thrombosis and embolism: Secondary | ICD-10-CM | POA: Diagnosis not present

## 2019-04-26 DIAGNOSIS — F445 Conversion disorder with seizures or convulsions: Secondary | ICD-10-CM | POA: Diagnosis present

## 2019-04-26 DIAGNOSIS — Z79899 Other long term (current) drug therapy: Secondary | ICD-10-CM | POA: Diagnosis not present

## 2019-04-26 DIAGNOSIS — R569 Unspecified convulsions: Secondary | ICD-10-CM | POA: Diagnosis not present

## 2019-04-26 DIAGNOSIS — G546 Phantom limb syndrome with pain: Secondary | ICD-10-CM | POA: Diagnosis present

## 2019-04-26 DIAGNOSIS — Z20822 Contact with and (suspected) exposure to covid-19: Secondary | ICD-10-CM | POA: Diagnosis present

## 2019-04-26 DIAGNOSIS — Z888 Allergy status to other drugs, medicaments and biological substances status: Secondary | ICD-10-CM | POA: Diagnosis not present

## 2019-04-26 DIAGNOSIS — Z7901 Long term (current) use of anticoagulants: Secondary | ICD-10-CM | POA: Diagnosis not present

## 2019-04-26 DIAGNOSIS — E559 Vitamin D deficiency, unspecified: Secondary | ICD-10-CM | POA: Diagnosis present

## 2019-04-26 DIAGNOSIS — Z880 Allergy status to penicillin: Secondary | ICD-10-CM | POA: Diagnosis not present

## 2019-04-26 DIAGNOSIS — Z91041 Radiographic dye allergy status: Secondary | ICD-10-CM | POA: Diagnosis not present

## 2019-04-26 DIAGNOSIS — G43909 Migraine, unspecified, not intractable, without status migrainosus: Secondary | ICD-10-CM | POA: Diagnosis present

## 2019-04-26 DIAGNOSIS — I10 Essential (primary) hypertension: Secondary | ICD-10-CM | POA: Diagnosis present

## 2019-04-26 DIAGNOSIS — Z885 Allergy status to narcotic agent status: Secondary | ICD-10-CM | POA: Diagnosis not present

## 2019-04-26 DIAGNOSIS — G894 Chronic pain syndrome: Secondary | ICD-10-CM | POA: Diagnosis present

## 2019-04-26 DIAGNOSIS — Z89611 Acquired absence of right leg above knee: Secondary | ICD-10-CM | POA: Diagnosis not present

## 2019-04-26 DIAGNOSIS — Z881 Allergy status to other antibiotic agents status: Secondary | ICD-10-CM | POA: Diagnosis not present

## 2019-05-12 DIAGNOSIS — G546 Phantom limb syndrome with pain: Secondary | ICD-10-CM | POA: Diagnosis not present

## 2019-05-12 DIAGNOSIS — G4709 Other insomnia: Secondary | ICD-10-CM | POA: Diagnosis not present

## 2019-05-12 DIAGNOSIS — F411 Generalized anxiety disorder: Secondary | ICD-10-CM | POA: Diagnosis not present

## 2019-05-12 DIAGNOSIS — F339 Major depressive disorder, recurrent, unspecified: Secondary | ICD-10-CM | POA: Diagnosis not present

## 2019-05-14 DIAGNOSIS — G4701 Insomnia due to medical condition: Secondary | ICD-10-CM | POA: Diagnosis not present

## 2019-05-14 DIAGNOSIS — G8929 Other chronic pain: Secondary | ICD-10-CM | POA: Diagnosis not present

## 2019-05-14 DIAGNOSIS — M79606 Pain in leg, unspecified: Secondary | ICD-10-CM | POA: Diagnosis not present

## 2019-05-14 DIAGNOSIS — T8789 Other complications of amputation stump: Secondary | ICD-10-CM | POA: Diagnosis not present

## 2019-05-15 DIAGNOSIS — M47812 Spondylosis without myelopathy or radiculopathy, cervical region: Secondary | ICD-10-CM | POA: Diagnosis not present

## 2019-05-15 DIAGNOSIS — S81812A Laceration without foreign body, left lower leg, initial encounter: Secondary | ICD-10-CM | POA: Diagnosis not present

## 2019-05-15 DIAGNOSIS — M25462 Effusion, left knee: Secondary | ICD-10-CM | POA: Diagnosis not present

## 2019-05-15 DIAGNOSIS — W19XXXA Unspecified fall, initial encounter: Secondary | ICD-10-CM | POA: Diagnosis not present

## 2019-05-15 DIAGNOSIS — Z23 Encounter for immunization: Secondary | ICD-10-CM | POA: Diagnosis not present

## 2019-05-15 DIAGNOSIS — W1830XA Fall on same level, unspecified, initial encounter: Secondary | ICD-10-CM | POA: Diagnosis not present

## 2019-05-15 DIAGNOSIS — Z049 Encounter for examination and observation for unspecified reason: Secondary | ICD-10-CM | POA: Diagnosis not present

## 2019-05-15 DIAGNOSIS — S0003XA Contusion of scalp, initial encounter: Secondary | ICD-10-CM | POA: Diagnosis not present

## 2019-05-15 DIAGNOSIS — M79662 Pain in left lower leg: Secondary | ICD-10-CM | POA: Diagnosis not present

## 2019-05-15 DIAGNOSIS — S0081XA Abrasion of other part of head, initial encounter: Secondary | ICD-10-CM | POA: Diagnosis not present

## 2019-05-15 DIAGNOSIS — Y999 Unspecified external cause status: Secondary | ICD-10-CM | POA: Diagnosis not present

## 2019-05-15 DIAGNOSIS — Z043 Encounter for examination and observation following other accident: Secondary | ICD-10-CM | POA: Diagnosis not present

## 2019-05-15 DIAGNOSIS — M25552 Pain in left hip: Secondary | ICD-10-CM | POA: Diagnosis not present

## 2019-05-15 DIAGNOSIS — S71112A Laceration without foreign body, left thigh, initial encounter: Secondary | ICD-10-CM | POA: Diagnosis not present

## 2019-05-15 DIAGNOSIS — R4182 Altered mental status, unspecified: Secondary | ICD-10-CM | POA: Diagnosis not present

## 2019-05-15 DIAGNOSIS — W010XXA Fall on same level from slipping, tripping and stumbling without subsequent striking against object, initial encounter: Secondary | ICD-10-CM | POA: Diagnosis not present

## 2019-05-18 DIAGNOSIS — Z20822 Contact with and (suspected) exposure to covid-19: Secondary | ICD-10-CM | POA: Diagnosis not present

## 2019-05-18 DIAGNOSIS — R509 Fever, unspecified: Secondary | ICD-10-CM | POA: Diagnosis not present

## 2019-05-18 DIAGNOSIS — L03116 Cellulitis of left lower limb: Secondary | ICD-10-CM | POA: Diagnosis not present

## 2019-05-18 DIAGNOSIS — A419 Sepsis, unspecified organism: Secondary | ICD-10-CM | POA: Diagnosis not present

## 2019-05-18 DIAGNOSIS — Z452 Encounter for adjustment and management of vascular access device: Secondary | ICD-10-CM | POA: Diagnosis not present

## 2019-05-18 DIAGNOSIS — M25462 Effusion, left knee: Secondary | ICD-10-CM | POA: Diagnosis not present

## 2019-05-18 DIAGNOSIS — R918 Other nonspecific abnormal finding of lung field: Secondary | ICD-10-CM | POA: Diagnosis not present

## 2019-05-18 DIAGNOSIS — M609 Myositis, unspecified: Secondary | ICD-10-CM | POA: Diagnosis not present

## 2019-05-19 DIAGNOSIS — S8002XA Contusion of left knee, initial encounter: Secondary | ICD-10-CM | POA: Diagnosis not present

## 2019-05-19 DIAGNOSIS — S81812S Laceration without foreign body, left lower leg, sequela: Secondary | ICD-10-CM | POA: Diagnosis not present

## 2019-05-19 DIAGNOSIS — W109XXD Fall (on) (from) unspecified stairs and steps, subsequent encounter: Secondary | ICD-10-CM | POA: Diagnosis not present

## 2019-05-19 DIAGNOSIS — R918 Other nonspecific abnormal finding of lung field: Secondary | ICD-10-CM | POA: Diagnosis not present

## 2019-05-19 DIAGNOSIS — A419 Sepsis, unspecified organism: Secondary | ICD-10-CM | POA: Diagnosis not present

## 2019-05-19 DIAGNOSIS — M25462 Effusion, left knee: Secondary | ICD-10-CM | POA: Diagnosis not present

## 2019-05-19 DIAGNOSIS — W108XXS Fall (on) (from) other stairs and steps, sequela: Secondary | ICD-10-CM | POA: Diagnosis not present

## 2019-05-19 DIAGNOSIS — S8002XD Contusion of left knee, subsequent encounter: Secondary | ICD-10-CM | POA: Diagnosis not present

## 2019-05-19 DIAGNOSIS — M94262 Chondromalacia, left knee: Secondary | ICD-10-CM | POA: Diagnosis not present

## 2019-05-19 DIAGNOSIS — I10 Essential (primary) hypertension: Secondary | ICD-10-CM | POA: Diagnosis not present

## 2019-05-19 DIAGNOSIS — Z452 Encounter for adjustment and management of vascular access device: Secondary | ICD-10-CM | POA: Diagnosis not present

## 2019-05-19 DIAGNOSIS — E669 Obesity, unspecified: Secondary | ICD-10-CM | POA: Diagnosis not present

## 2019-05-19 DIAGNOSIS — S8992XD Unspecified injury of left lower leg, subsequent encounter: Secondary | ICD-10-CM | POA: Diagnosis not present

## 2019-05-19 DIAGNOSIS — L03116 Cellulitis of left lower limb: Secondary | ICD-10-CM | POA: Diagnosis not present

## 2019-05-19 DIAGNOSIS — Z89611 Acquired absence of right leg above knee: Secondary | ICD-10-CM | POA: Diagnosis not present

## 2019-05-20 DIAGNOSIS — S8002XA Contusion of left knee, initial encounter: Secondary | ICD-10-CM | POA: Diagnosis not present

## 2019-05-20 DIAGNOSIS — W108XXS Fall (on) (from) other stairs and steps, sequela: Secondary | ICD-10-CM | POA: Diagnosis not present

## 2019-05-20 DIAGNOSIS — Z89611 Acquired absence of right leg above knee: Secondary | ICD-10-CM | POA: Diagnosis not present

## 2019-05-20 DIAGNOSIS — E669 Obesity, unspecified: Secondary | ICD-10-CM | POA: Diagnosis not present

## 2019-05-20 DIAGNOSIS — Z452 Encounter for adjustment and management of vascular access device: Secondary | ICD-10-CM | POA: Diagnosis not present

## 2019-05-20 DIAGNOSIS — L03116 Cellulitis of left lower limb: Secondary | ICD-10-CM | POA: Diagnosis not present

## 2019-05-20 DIAGNOSIS — S8002XD Contusion of left knee, subsequent encounter: Secondary | ICD-10-CM | POA: Diagnosis not present

## 2019-05-20 DIAGNOSIS — S81812S Laceration without foreign body, left lower leg, sequela: Secondary | ICD-10-CM | POA: Diagnosis not present

## 2019-05-20 DIAGNOSIS — I10 Essential (primary) hypertension: Secondary | ICD-10-CM | POA: Diagnosis not present

## 2019-05-20 DIAGNOSIS — W109XXD Fall (on) (from) unspecified stairs and steps, subsequent encounter: Secondary | ICD-10-CM | POA: Diagnosis not present

## 2019-05-20 DIAGNOSIS — S8992XD Unspecified injury of left lower leg, subsequent encounter: Secondary | ICD-10-CM | POA: Diagnosis not present

## 2019-05-20 DIAGNOSIS — A419 Sepsis, unspecified organism: Secondary | ICD-10-CM | POA: Diagnosis not present

## 2019-05-22 DIAGNOSIS — L02416 Cutaneous abscess of left lower limb: Secondary | ICD-10-CM | POA: Diagnosis not present

## 2019-05-22 DIAGNOSIS — M609 Myositis, unspecified: Secondary | ICD-10-CM | POA: Diagnosis not present

## 2019-05-22 DIAGNOSIS — L03116 Cellulitis of left lower limb: Secondary | ICD-10-CM | POA: Diagnosis not present

## 2019-05-22 DIAGNOSIS — M60004 Infective myositis, unspecified left leg: Secondary | ICD-10-CM | POA: Diagnosis not present

## 2019-05-23 DIAGNOSIS — L03116 Cellulitis of left lower limb: Secondary | ICD-10-CM | POA: Diagnosis not present

## 2019-05-23 DIAGNOSIS — G546 Phantom limb syndrome with pain: Secondary | ICD-10-CM | POA: Diagnosis not present

## 2019-05-23 DIAGNOSIS — Z89611 Acquired absence of right leg above knee: Secondary | ICD-10-CM | POA: Diagnosis not present

## 2019-05-23 DIAGNOSIS — E669 Obesity, unspecified: Secondary | ICD-10-CM | POA: Diagnosis not present

## 2019-05-23 DIAGNOSIS — S8992XD Unspecified injury of left lower leg, subsequent encounter: Secondary | ICD-10-CM | POA: Diagnosis not present

## 2019-05-23 DIAGNOSIS — S81812S Laceration without foreign body, left lower leg, sequela: Secondary | ICD-10-CM | POA: Diagnosis not present

## 2019-05-23 DIAGNOSIS — I10 Essential (primary) hypertension: Secondary | ICD-10-CM | POA: Diagnosis not present

## 2019-05-23 DIAGNOSIS — S8002XA Contusion of left knee, initial encounter: Secondary | ICD-10-CM | POA: Diagnosis not present

## 2019-05-23 DIAGNOSIS — W108XXS Fall (on) (from) other stairs and steps, sequela: Secondary | ICD-10-CM | POA: Diagnosis not present

## 2019-05-26 DIAGNOSIS — T8789 Other complications of amputation stump: Secondary | ICD-10-CM | POA: Diagnosis not present

## 2019-05-26 DIAGNOSIS — M79606 Pain in leg, unspecified: Secondary | ICD-10-CM | POA: Diagnosis not present

## 2019-05-26 DIAGNOSIS — G546 Phantom limb syndrome with pain: Secondary | ICD-10-CM | POA: Diagnosis not present

## 2019-05-26 DIAGNOSIS — R0602 Shortness of breath: Secondary | ICD-10-CM | POA: Diagnosis not present

## 2019-05-26 DIAGNOSIS — G40409 Other generalized epilepsy and epileptic syndromes, not intractable, without status epilepticus: Secondary | ICD-10-CM | POA: Diagnosis not present

## 2019-05-26 DIAGNOSIS — Z79899 Other long term (current) drug therapy: Secondary | ICD-10-CM | POA: Diagnosis not present

## 2019-05-30 DIAGNOSIS — Z9889 Other specified postprocedural states: Secondary | ICD-10-CM | POA: Diagnosis not present

## 2019-06-09 DIAGNOSIS — G4709 Other insomnia: Secondary | ICD-10-CM | POA: Diagnosis not present

## 2019-06-09 DIAGNOSIS — F411 Generalized anxiety disorder: Secondary | ICD-10-CM | POA: Diagnosis not present

## 2019-06-09 DIAGNOSIS — F339 Major depressive disorder, recurrent, unspecified: Secondary | ICD-10-CM | POA: Diagnosis not present

## 2019-06-09 IMAGING — CR DG CHEST 2V
2 series · 2 of 2 positions shown · non-contrast
Comparison: 07/15/2016

CLINICAL DATA: Chest pressure for 2 hours, worsening. Nausea and
weakness.

EXAM:
CHEST  2 VIEW

[chest lat]
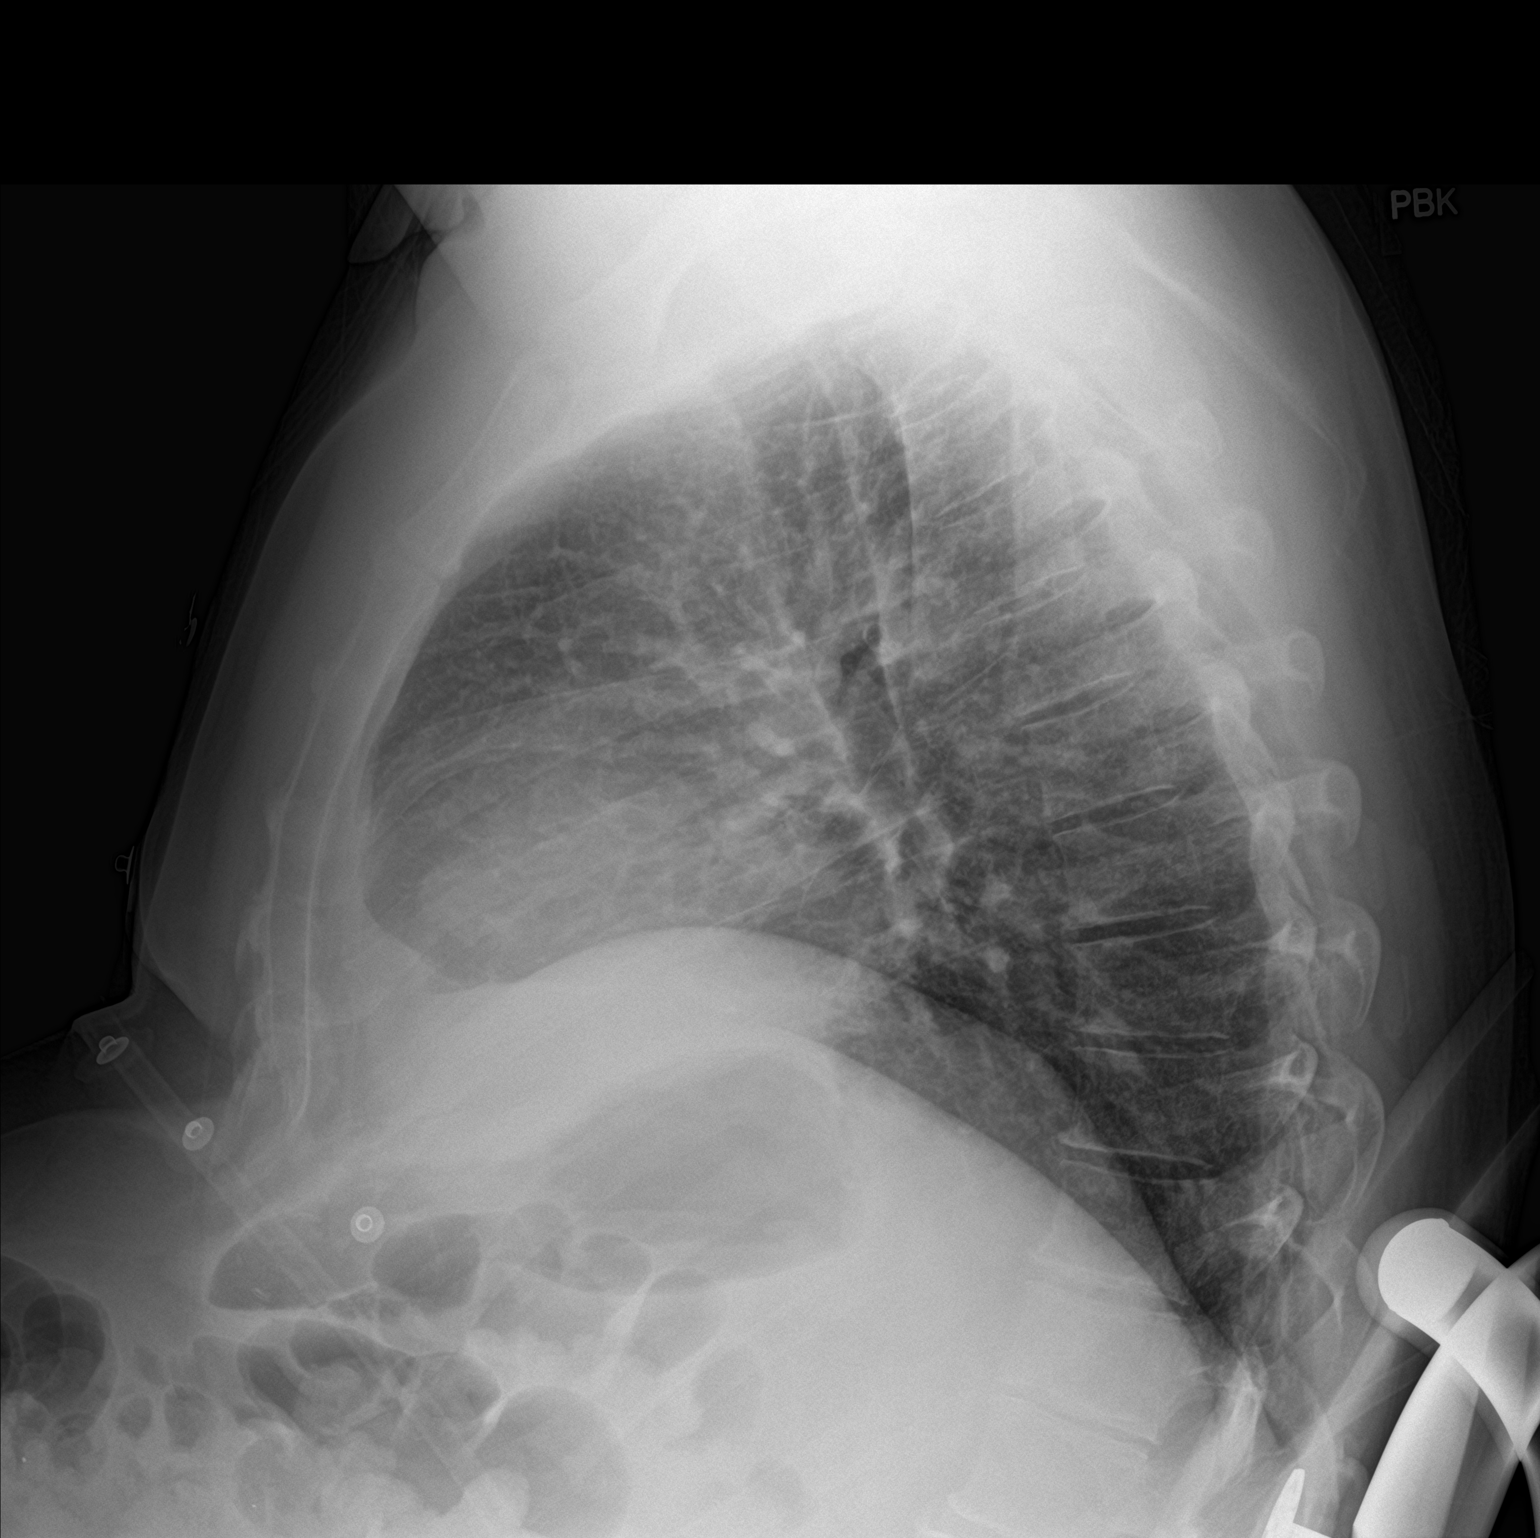

[chest ap]
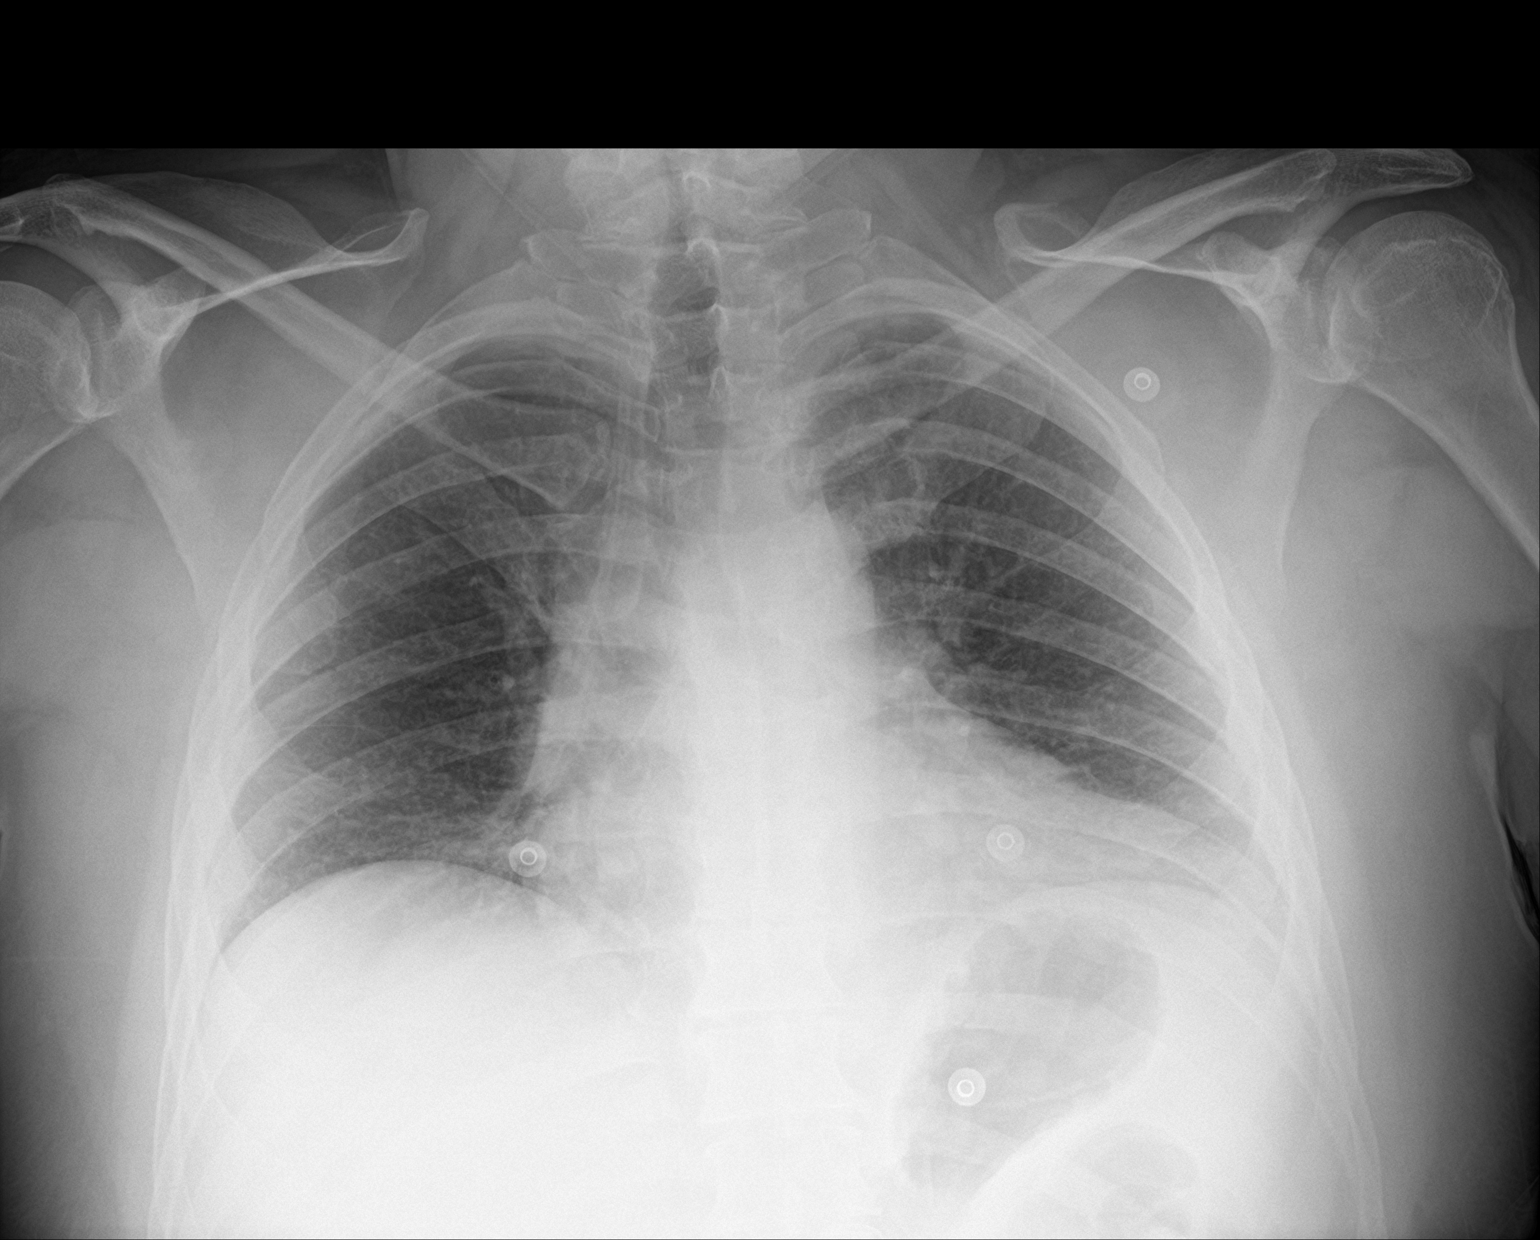

[2 of 2 positions shown; findings below may reference images not displayed]

FINDINGS: Low lung volumes are present, causing crowding of the pulmonary
vasculature. Accounting for this, the lungs appear grossly clear
aside from some mild subsegmental atelectasis at the left lung base.
Heart size is exaggerated by the low lung volumes but overall
thought to be within normal limits. No pleural effusion.
IMPRESSION: 1. Low lung volumes. Otherwise no significant abnormality is
observed.

## 2019-06-29 DIAGNOSIS — T8789 Other complications of amputation stump: Secondary | ICD-10-CM | POA: Diagnosis not present

## 2019-06-29 DIAGNOSIS — G8929 Other chronic pain: Secondary | ICD-10-CM | POA: Diagnosis not present

## 2019-06-29 DIAGNOSIS — G546 Phantom limb syndrome with pain: Secondary | ICD-10-CM | POA: Diagnosis not present

## 2019-06-29 DIAGNOSIS — Z79899 Other long term (current) drug therapy: Secondary | ICD-10-CM | POA: Diagnosis not present

## 2019-06-29 DIAGNOSIS — M79606 Pain in leg, unspecified: Secondary | ICD-10-CM | POA: Diagnosis not present

## 2019-07-08 DIAGNOSIS — G4709 Other insomnia: Secondary | ICD-10-CM | POA: Diagnosis not present

## 2019-07-08 DIAGNOSIS — F411 Generalized anxiety disorder: Secondary | ICD-10-CM | POA: Diagnosis not present

## 2019-07-08 DIAGNOSIS — Z1339 Encounter for screening examination for other mental health and behavioral disorders: Secondary | ICD-10-CM | POA: Diagnosis not present

## 2019-07-08 DIAGNOSIS — F339 Major depressive disorder, recurrent, unspecified: Secondary | ICD-10-CM | POA: Diagnosis not present

## 2019-07-20 DIAGNOSIS — G4709 Other insomnia: Secondary | ICD-10-CM | POA: Diagnosis not present

## 2019-07-20 DIAGNOSIS — F411 Generalized anxiety disorder: Secondary | ICD-10-CM | POA: Diagnosis not present

## 2019-07-20 DIAGNOSIS — F339 Major depressive disorder, recurrent, unspecified: Secondary | ICD-10-CM | POA: Diagnosis not present

## 2019-07-27 DIAGNOSIS — T8789 Other complications of amputation stump: Secondary | ICD-10-CM | POA: Diagnosis not present

## 2019-07-27 DIAGNOSIS — G8929 Other chronic pain: Secondary | ICD-10-CM | POA: Diagnosis not present

## 2019-07-27 DIAGNOSIS — Z79899 Other long term (current) drug therapy: Secondary | ICD-10-CM | POA: Diagnosis not present

## 2019-07-27 DIAGNOSIS — M79606 Pain in leg, unspecified: Secondary | ICD-10-CM | POA: Diagnosis not present

## 2019-07-27 DIAGNOSIS — G546 Phantom limb syndrome with pain: Secondary | ICD-10-CM | POA: Diagnosis not present

## 2019-08-17 DIAGNOSIS — S72002A Fracture of unspecified part of neck of left femur, initial encounter for closed fracture: Secondary | ICD-10-CM | POA: Diagnosis not present

## 2019-08-17 DIAGNOSIS — Z89612 Acquired absence of left leg above knee: Secondary | ICD-10-CM | POA: Diagnosis not present

## 2019-08-17 DIAGNOSIS — R079 Chest pain, unspecified: Secondary | ICD-10-CM | POA: Diagnosis not present

## 2019-08-17 DIAGNOSIS — S72011A Unspecified intracapsular fracture of right femur, initial encounter for closed fracture: Secondary | ICD-10-CM | POA: Diagnosis not present

## 2019-08-17 DIAGNOSIS — Z041 Encounter for examination and observation following transport accident: Secondary | ICD-10-CM | POA: Diagnosis not present

## 2019-08-17 DIAGNOSIS — R55 Syncope and collapse: Secondary | ICD-10-CM | POA: Diagnosis not present

## 2019-08-17 DIAGNOSIS — R109 Unspecified abdominal pain: Secondary | ICD-10-CM | POA: Diagnosis not present

## 2019-08-17 DIAGNOSIS — Z89611 Acquired absence of right leg above knee: Secondary | ICD-10-CM | POA: Diagnosis not present

## 2019-08-17 DIAGNOSIS — S199XXA Unspecified injury of neck, initial encounter: Secondary | ICD-10-CM | POA: Diagnosis not present

## 2019-08-17 DIAGNOSIS — R569 Unspecified convulsions: Secondary | ICD-10-CM | POA: Diagnosis not present

## 2019-08-17 DIAGNOSIS — R0602 Shortness of breath: Secondary | ICD-10-CM | POA: Diagnosis not present

## 2019-08-17 DIAGNOSIS — S72001A Fracture of unspecified part of neck of right femur, initial encounter for closed fracture: Secondary | ICD-10-CM | POA: Diagnosis not present

## 2019-08-17 DIAGNOSIS — E669 Obesity, unspecified: Secondary | ICD-10-CM | POA: Diagnosis not present

## 2019-08-17 DIAGNOSIS — G40901 Epilepsy, unspecified, not intractable, with status epilepticus: Secondary | ICD-10-CM | POA: Diagnosis not present

## 2019-08-17 DIAGNOSIS — K6389 Other specified diseases of intestine: Secondary | ICD-10-CM | POA: Diagnosis not present

## 2019-08-17 DIAGNOSIS — Z20822 Contact with and (suspected) exposure to covid-19: Secondary | ICD-10-CM | POA: Diagnosis not present

## 2019-08-17 DIAGNOSIS — M25522 Pain in left elbow: Secondary | ICD-10-CM | POA: Diagnosis not present

## 2019-08-17 DIAGNOSIS — R404 Transient alteration of awareness: Secondary | ICD-10-CM | POA: Diagnosis not present

## 2019-08-17 DIAGNOSIS — M549 Dorsalgia, unspecified: Secondary | ICD-10-CM | POA: Diagnosis not present

## 2019-08-17 DIAGNOSIS — R4020 Unspecified coma: Secondary | ICD-10-CM | POA: Diagnosis not present

## 2019-08-17 DIAGNOSIS — I10 Essential (primary) hypertension: Secondary | ICD-10-CM | POA: Diagnosis not present

## 2019-08-17 DIAGNOSIS — M25511 Pain in right shoulder: Secondary | ICD-10-CM | POA: Diagnosis not present

## 2019-08-18 DIAGNOSIS — R55 Syncope and collapse: Secondary | ICD-10-CM | POA: Diagnosis not present

## 2019-08-18 DIAGNOSIS — Z471 Aftercare following joint replacement surgery: Secondary | ICD-10-CM | POA: Diagnosis not present

## 2019-08-18 DIAGNOSIS — F5101 Primary insomnia: Secondary | ICD-10-CM | POA: Diagnosis not present

## 2019-08-18 DIAGNOSIS — S72002A Fracture of unspecified part of neck of left femur, initial encounter for closed fracture: Secondary | ICD-10-CM | POA: Diagnosis not present

## 2019-08-18 DIAGNOSIS — Z96641 Presence of right artificial hip joint: Secondary | ICD-10-CM | POA: Diagnosis not present

## 2019-08-18 DIAGNOSIS — I10 Essential (primary) hypertension: Secondary | ICD-10-CM | POA: Diagnosis not present

## 2019-08-18 DIAGNOSIS — S7291XA Unspecified fracture of right femur, initial encounter for closed fracture: Secondary | ICD-10-CM | POA: Diagnosis not present

## 2019-08-18 DIAGNOSIS — M1611 Unilateral primary osteoarthritis, right hip: Secondary | ICD-10-CM | POA: Diagnosis not present

## 2019-08-18 DIAGNOSIS — S72001A Fracture of unspecified part of neck of right femur, initial encounter for closed fracture: Secondary | ICD-10-CM | POA: Diagnosis not present

## 2019-08-18 DIAGNOSIS — F419 Anxiety disorder, unspecified: Secondary | ICD-10-CM | POA: Diagnosis not present

## 2019-08-18 DIAGNOSIS — M80051A Age-related osteoporosis with current pathological fracture, right femur, initial encounter for fracture: Secondary | ICD-10-CM | POA: Diagnosis not present

## 2019-08-19 DIAGNOSIS — S72001A Fracture of unspecified part of neck of right femur, initial encounter for closed fracture: Secondary | ICD-10-CM | POA: Diagnosis not present

## 2019-08-19 DIAGNOSIS — M80051A Age-related osteoporosis with current pathological fracture, right femur, initial encounter for fracture: Secondary | ICD-10-CM | POA: Diagnosis not present

## 2019-08-19 DIAGNOSIS — F419 Anxiety disorder, unspecified: Secondary | ICD-10-CM | POA: Diagnosis not present

## 2019-08-20 DIAGNOSIS — S72001D Fracture of unspecified part of neck of right femur, subsequent encounter for closed fracture with routine healing: Secondary | ICD-10-CM | POA: Diagnosis not present

## 2019-08-20 DIAGNOSIS — G8918 Other acute postprocedural pain: Secondary | ICD-10-CM | POA: Diagnosis not present

## 2019-08-20 DIAGNOSIS — Z96641 Presence of right artificial hip joint: Secondary | ICD-10-CM | POA: Diagnosis not present

## 2019-08-23 DIAGNOSIS — I10 Essential (primary) hypertension: Secondary | ICD-10-CM | POA: Diagnosis not present

## 2019-08-23 DIAGNOSIS — Z96641 Presence of right artificial hip joint: Secondary | ICD-10-CM | POA: Diagnosis not present

## 2019-08-23 DIAGNOSIS — F5101 Primary insomnia: Secondary | ICD-10-CM | POA: Diagnosis not present

## 2019-09-01 DIAGNOSIS — Z96641 Presence of right artificial hip joint: Secondary | ICD-10-CM | POA: Diagnosis not present

## 2019-09-11 DIAGNOSIS — R509 Fever, unspecified: Secondary | ICD-10-CM | POA: Diagnosis not present

## 2019-09-11 DIAGNOSIS — I10 Essential (primary) hypertension: Secondary | ICD-10-CM | POA: Diagnosis not present

## 2019-09-11 DIAGNOSIS — M25551 Pain in right hip: Secondary | ICD-10-CM | POA: Diagnosis not present

## 2019-09-11 DIAGNOSIS — M25562 Pain in left knee: Secondary | ICD-10-CM | POA: Diagnosis not present

## 2019-09-12 DIAGNOSIS — R509 Fever, unspecified: Secondary | ICD-10-CM | POA: Diagnosis not present

## 2019-09-12 DIAGNOSIS — M25551 Pain in right hip: Secondary | ICD-10-CM | POA: Diagnosis not present

## 2019-09-12 DIAGNOSIS — R000 Tachycardia, unspecified: Secondary | ICD-10-CM | POA: Diagnosis not present

## 2019-09-12 DIAGNOSIS — I10 Essential (primary) hypertension: Secondary | ICD-10-CM | POA: Diagnosis not present

## 2019-09-12 DIAGNOSIS — M25562 Pain in left knee: Secondary | ICD-10-CM | POA: Diagnosis not present

## 2019-09-13 DIAGNOSIS — A419 Sepsis, unspecified organism: Secondary | ICD-10-CM | POA: Diagnosis not present

## 2019-09-13 DIAGNOSIS — I10 Essential (primary) hypertension: Secondary | ICD-10-CM | POA: Diagnosis not present

## 2019-09-13 DIAGNOSIS — R000 Tachycardia, unspecified: Secondary | ICD-10-CM | POA: Diagnosis not present

## 2019-09-13 DIAGNOSIS — R5081 Fever presenting with conditions classified elsewhere: Secondary | ICD-10-CM | POA: Diagnosis not present

## 2019-09-13 DIAGNOSIS — T8451XD Infection and inflammatory reaction due to internal right hip prosthesis, subsequent encounter: Secondary | ICD-10-CM | POA: Diagnosis not present

## 2019-09-13 DIAGNOSIS — M25551 Pain in right hip: Secondary | ICD-10-CM | POA: Diagnosis not present

## 2019-09-13 DIAGNOSIS — M25562 Pain in left knee: Secondary | ICD-10-CM | POA: Diagnosis not present

## 2019-09-13 DIAGNOSIS — F419 Anxiety disorder, unspecified: Secondary | ICD-10-CM | POA: Diagnosis not present

## 2019-09-13 DIAGNOSIS — R509 Fever, unspecified: Secondary | ICD-10-CM | POA: Diagnosis not present

## 2019-09-13 DIAGNOSIS — D62 Acute posthemorrhagic anemia: Secondary | ICD-10-CM | POA: Diagnosis not present

## 2019-09-16 DIAGNOSIS — D62 Acute posthemorrhagic anemia: Secondary | ICD-10-CM | POA: Diagnosis not present

## 2019-09-16 DIAGNOSIS — R509 Fever, unspecified: Secondary | ICD-10-CM | POA: Diagnosis not present

## 2019-09-16 DIAGNOSIS — I10 Essential (primary) hypertension: Secondary | ICD-10-CM | POA: Diagnosis not present

## 2019-09-16 DIAGNOSIS — F419 Anxiety disorder, unspecified: Secondary | ICD-10-CM | POA: Diagnosis not present

## 2019-09-16 DIAGNOSIS — M25562 Pain in left knee: Secondary | ICD-10-CM | POA: Diagnosis not present

## 2019-09-16 DIAGNOSIS — R000 Tachycardia, unspecified: Secondary | ICD-10-CM | POA: Diagnosis not present

## 2019-09-16 DIAGNOSIS — A419 Sepsis, unspecified organism: Secondary | ICD-10-CM | POA: Diagnosis not present

## 2019-09-16 DIAGNOSIS — M25551 Pain in right hip: Secondary | ICD-10-CM | POA: Diagnosis not present

## 2019-09-17 DIAGNOSIS — A419 Sepsis, unspecified organism: Secondary | ICD-10-CM | POA: Diagnosis not present

## 2019-09-17 DIAGNOSIS — I10 Essential (primary) hypertension: Secondary | ICD-10-CM | POA: Diagnosis not present

## 2019-09-17 DIAGNOSIS — R509 Fever, unspecified: Secondary | ICD-10-CM | POA: Diagnosis not present

## 2019-09-17 DIAGNOSIS — R000 Tachycardia, unspecified: Secondary | ICD-10-CM | POA: Diagnosis not present

## 2019-09-17 DIAGNOSIS — D62 Acute posthemorrhagic anemia: Secondary | ICD-10-CM | POA: Diagnosis not present

## 2019-09-17 DIAGNOSIS — F419 Anxiety disorder, unspecified: Secondary | ICD-10-CM | POA: Diagnosis not present

## 2019-09-17 DIAGNOSIS — M25562 Pain in left knee: Secondary | ICD-10-CM | POA: Diagnosis not present

## 2019-09-17 DIAGNOSIS — M25551 Pain in right hip: Secondary | ICD-10-CM | POA: Diagnosis not present

## 2019-09-18 DIAGNOSIS — A419 Sepsis, unspecified organism: Secondary | ICD-10-CM | POA: Diagnosis not present

## 2019-09-18 DIAGNOSIS — M25562 Pain in left knee: Secondary | ICD-10-CM | POA: Diagnosis not present

## 2019-09-18 DIAGNOSIS — I10 Essential (primary) hypertension: Secondary | ICD-10-CM | POA: Diagnosis not present

## 2019-09-18 DIAGNOSIS — R000 Tachycardia, unspecified: Secondary | ICD-10-CM | POA: Diagnosis not present

## 2019-09-18 DIAGNOSIS — F419 Anxiety disorder, unspecified: Secondary | ICD-10-CM | POA: Diagnosis not present

## 2019-09-18 DIAGNOSIS — M25551 Pain in right hip: Secondary | ICD-10-CM | POA: Diagnosis not present

## 2019-09-18 DIAGNOSIS — D62 Acute posthemorrhagic anemia: Secondary | ICD-10-CM | POA: Diagnosis not present

## 2019-09-18 DIAGNOSIS — R509 Fever, unspecified: Secondary | ICD-10-CM | POA: Diagnosis not present

## 2019-09-19 DIAGNOSIS — M25562 Pain in left knee: Secondary | ICD-10-CM | POA: Diagnosis not present

## 2019-09-19 DIAGNOSIS — R000 Tachycardia, unspecified: Secondary | ICD-10-CM | POA: Diagnosis not present

## 2019-09-19 DIAGNOSIS — R509 Fever, unspecified: Secondary | ICD-10-CM | POA: Diagnosis not present

## 2019-09-19 DIAGNOSIS — M25551 Pain in right hip: Secondary | ICD-10-CM | POA: Diagnosis not present

## 2019-09-19 DIAGNOSIS — F419 Anxiety disorder, unspecified: Secondary | ICD-10-CM | POA: Diagnosis not present

## 2019-09-19 DIAGNOSIS — I10 Essential (primary) hypertension: Secondary | ICD-10-CM | POA: Diagnosis not present

## 2019-09-19 DIAGNOSIS — A419 Sepsis, unspecified organism: Secondary | ICD-10-CM | POA: Diagnosis not present

## 2019-09-19 DIAGNOSIS — D62 Acute posthemorrhagic anemia: Secondary | ICD-10-CM | POA: Diagnosis not present

## 2019-09-26 DIAGNOSIS — G2581 Restless legs syndrome: Secondary | ICD-10-CM | POA: Diagnosis not present

## 2019-09-26 DIAGNOSIS — M25562 Pain in left knee: Secondary | ICD-10-CM | POA: Diagnosis not present

## 2019-09-26 DIAGNOSIS — F419 Anxiety disorder, unspecified: Secondary | ICD-10-CM | POA: Diagnosis not present

## 2019-09-26 DIAGNOSIS — M00862 Arthritis due to other bacteria, left knee: Secondary | ICD-10-CM | POA: Diagnosis not present

## 2019-09-26 DIAGNOSIS — T8451XA Infection and inflammatory reaction due to internal right hip prosthesis, initial encounter: Secondary | ICD-10-CM | POA: Diagnosis not present

## 2019-09-26 DIAGNOSIS — Z20822 Contact with and (suspected) exposure to covid-19: Secondary | ICD-10-CM | POA: Diagnosis not present

## 2019-09-26 DIAGNOSIS — E43 Unspecified severe protein-calorie malnutrition: Secondary | ICD-10-CM | POA: Diagnosis not present

## 2019-09-27 DIAGNOSIS — B954 Other streptococcus as the cause of diseases classified elsewhere: Secondary | ICD-10-CM | POA: Diagnosis not present

## 2019-09-27 DIAGNOSIS — M25562 Pain in left knee: Secondary | ICD-10-CM | POA: Diagnosis not present

## 2019-09-27 DIAGNOSIS — Z8249 Family history of ischemic heart disease and other diseases of the circulatory system: Secondary | ICD-10-CM | POA: Diagnosis not present

## 2019-09-27 DIAGNOSIS — B965 Pseudomonas (aeruginosa) (mallei) (pseudomallei) as the cause of diseases classified elsewhere: Secondary | ICD-10-CM | POA: Diagnosis not present

## 2019-09-27 DIAGNOSIS — M659 Synovitis and tenosynovitis, unspecified: Secondary | ICD-10-CM | POA: Diagnosis not present

## 2019-09-27 DIAGNOSIS — T8451XA Infection and inflammatory reaction due to internal right hip prosthesis, initial encounter: Secondary | ICD-10-CM | POA: Diagnosis present

## 2019-09-27 DIAGNOSIS — M00262 Other streptococcal arthritis, left knee: Secondary | ICD-10-CM | POA: Diagnosis not present

## 2019-09-27 DIAGNOSIS — E43 Unspecified severe protein-calorie malnutrition: Secondary | ICD-10-CM | POA: Diagnosis present

## 2019-09-27 DIAGNOSIS — G2581 Restless legs syndrome: Secondary | ICD-10-CM | POA: Diagnosis present

## 2019-09-27 DIAGNOSIS — M00851 Arthritis due to other bacteria, right hip: Secondary | ICD-10-CM | POA: Diagnosis not present

## 2019-09-27 DIAGNOSIS — Z89611 Acquired absence of right leg above knee: Secondary | ICD-10-CM | POA: Diagnosis not present

## 2019-09-27 DIAGNOSIS — M00062 Staphylococcal arthritis, left knee: Secondary | ICD-10-CM | POA: Diagnosis not present

## 2019-09-27 DIAGNOSIS — M65162 Other infective (teno)synovitis, left knee: Secondary | ICD-10-CM | POA: Diagnosis not present

## 2019-09-27 DIAGNOSIS — G43911 Migraine, unspecified, intractable, with status migrainosus: Secondary | ICD-10-CM | POA: Diagnosis not present

## 2019-09-27 DIAGNOSIS — M009 Pyogenic arthritis, unspecified: Secondary | ICD-10-CM | POA: Diagnosis not present

## 2019-09-27 DIAGNOSIS — Z20822 Contact with and (suspected) exposure to covid-19: Secondary | ICD-10-CM | POA: Diagnosis present

## 2019-09-27 DIAGNOSIS — M65862 Other synovitis and tenosynovitis, left lower leg: Secondary | ICD-10-CM | POA: Diagnosis not present

## 2019-09-27 DIAGNOSIS — M00862 Arthritis due to other bacteria, left knee: Secondary | ICD-10-CM | POA: Diagnosis present

## 2019-09-27 DIAGNOSIS — M25462 Effusion, left knee: Secondary | ICD-10-CM | POA: Diagnosis not present

## 2019-09-27 DIAGNOSIS — Z79899 Other long term (current) drug therapy: Secondary | ICD-10-CM | POA: Diagnosis not present

## 2019-09-27 DIAGNOSIS — G43909 Migraine, unspecified, not intractable, without status migrainosus: Secondary | ICD-10-CM | POA: Diagnosis not present

## 2019-09-27 DIAGNOSIS — G8929 Other chronic pain: Secondary | ICD-10-CM | POA: Diagnosis present

## 2019-09-27 DIAGNOSIS — K219 Gastro-esophageal reflux disease without esophagitis: Secondary | ICD-10-CM | POA: Diagnosis present

## 2019-09-27 DIAGNOSIS — Z9049 Acquired absence of other specified parts of digestive tract: Secondary | ICD-10-CM | POA: Diagnosis not present

## 2019-09-27 DIAGNOSIS — D649 Anemia, unspecified: Secondary | ICD-10-CM | POA: Diagnosis not present

## 2019-09-27 DIAGNOSIS — F419 Anxiety disorder, unspecified: Secondary | ICD-10-CM | POA: Diagnosis present

## 2019-09-27 DIAGNOSIS — I10 Essential (primary) hypertension: Secondary | ICD-10-CM | POA: Diagnosis present

## 2019-10-04 DIAGNOSIS — L02415 Cutaneous abscess of right lower limb: Secondary | ICD-10-CM | POA: Diagnosis not present

## 2019-10-04 DIAGNOSIS — S72002D Fracture of unspecified part of neck of left femur, subsequent encounter for closed fracture with routine healing: Secondary | ICD-10-CM | POA: Diagnosis not present

## 2019-10-04 DIAGNOSIS — Z7409 Other reduced mobility: Secondary | ICD-10-CM | POA: Diagnosis not present

## 2019-10-04 DIAGNOSIS — M898X9 Other specified disorders of bone, unspecified site: Secondary | ICD-10-CM | POA: Diagnosis not present

## 2019-10-04 DIAGNOSIS — M25562 Pain in left knee: Secondary | ICD-10-CM | POA: Diagnosis not present

## 2019-10-04 DIAGNOSIS — M00062 Staphylococcal arthritis, left knee: Secondary | ICD-10-CM | POA: Diagnosis not present

## 2019-10-04 DIAGNOSIS — T8451XD Infection and inflammatory reaction due to internal right hip prosthesis, subsequent encounter: Secondary | ICD-10-CM | POA: Diagnosis not present

## 2019-10-04 DIAGNOSIS — M80051D Age-related osteoporosis with current pathological fracture, right femur, subsequent encounter for fracture with routine healing: Secondary | ICD-10-CM | POA: Diagnosis not present

## 2019-10-14 DIAGNOSIS — T8789 Other complications of amputation stump: Secondary | ICD-10-CM | POA: Diagnosis not present

## 2019-10-14 DIAGNOSIS — G8929 Other chronic pain: Secondary | ICD-10-CM | POA: Diagnosis not present

## 2019-10-14 DIAGNOSIS — M79606 Pain in leg, unspecified: Secondary | ICD-10-CM | POA: Diagnosis not present

## 2019-10-14 DIAGNOSIS — G546 Phantom limb syndrome with pain: Secondary | ICD-10-CM | POA: Diagnosis not present

## 2019-10-14 DIAGNOSIS — Z79899 Other long term (current) drug therapy: Secondary | ICD-10-CM | POA: Diagnosis not present

## 2019-11-14 DIAGNOSIS — F411 Generalized anxiety disorder: Secondary | ICD-10-CM | POA: Diagnosis not present

## 2019-11-14 DIAGNOSIS — G4709 Other insomnia: Secondary | ICD-10-CM | POA: Diagnosis not present

## 2019-11-14 DIAGNOSIS — Z6831 Body mass index (BMI) 31.0-31.9, adult: Secondary | ICD-10-CM | POA: Diagnosis not present

## 2019-11-14 DIAGNOSIS — F339 Major depressive disorder, recurrent, unspecified: Secondary | ICD-10-CM | POA: Diagnosis not present

## 2019-11-18 DIAGNOSIS — E669 Obesity, unspecified: Secondary | ICD-10-CM | POA: Diagnosis not present

## 2019-11-18 DIAGNOSIS — Z79899 Other long term (current) drug therapy: Secondary | ICD-10-CM | POA: Diagnosis not present

## 2019-11-18 DIAGNOSIS — I10 Essential (primary) hypertension: Secondary | ICD-10-CM | POA: Diagnosis not present

## 2019-11-18 DIAGNOSIS — R600 Localized edema: Secondary | ICD-10-CM | POA: Diagnosis not present

## 2019-11-18 DIAGNOSIS — Z91041 Radiographic dye allergy status: Secondary | ICD-10-CM | POA: Diagnosis not present

## 2019-11-18 DIAGNOSIS — Z8719 Personal history of other diseases of the digestive system: Secondary | ICD-10-CM | POA: Diagnosis not present

## 2019-11-18 DIAGNOSIS — Z8619 Personal history of other infectious and parasitic diseases: Secondary | ICD-10-CM | POA: Diagnosis not present

## 2019-11-18 DIAGNOSIS — Z880 Allergy status to penicillin: Secondary | ICD-10-CM | POA: Diagnosis not present

## 2019-11-18 DIAGNOSIS — M1712 Unilateral primary osteoarthritis, left knee: Secondary | ICD-10-CM | POA: Diagnosis not present

## 2019-11-18 DIAGNOSIS — Z8739 Personal history of other diseases of the musculoskeletal system and connective tissue: Secondary | ICD-10-CM | POA: Diagnosis not present

## 2019-11-18 DIAGNOSIS — Z89611 Acquired absence of right leg above knee: Secondary | ICD-10-CM | POA: Diagnosis not present

## 2019-11-18 DIAGNOSIS — Z9109 Other allergy status, other than to drugs and biological substances: Secondary | ICD-10-CM | POA: Diagnosis not present

## 2019-11-18 DIAGNOSIS — M25462 Effusion, left knee: Secondary | ICD-10-CM | POA: Diagnosis not present

## 2019-11-18 DIAGNOSIS — Z885 Allergy status to narcotic agent status: Secondary | ICD-10-CM | POA: Diagnosis not present

## 2019-11-18 DIAGNOSIS — M25461 Effusion, right knee: Secondary | ICD-10-CM | POA: Diagnosis not present

## 2019-11-18 DIAGNOSIS — Z888 Allergy status to other drugs, medicaments and biological substances status: Secondary | ICD-10-CM | POA: Diagnosis not present

## 2019-11-24 DIAGNOSIS — M79662 Pain in left lower leg: Secondary | ICD-10-CM | POA: Diagnosis not present

## 2019-11-24 DIAGNOSIS — Z89611 Acquired absence of right leg above knee: Secondary | ICD-10-CM | POA: Diagnosis not present

## 2019-11-24 DIAGNOSIS — M79605 Pain in left leg: Secondary | ICD-10-CM | POA: Diagnosis not present

## 2019-11-24 DIAGNOSIS — M25462 Effusion, left knee: Secondary | ICD-10-CM | POA: Diagnosis not present

## 2019-11-24 DIAGNOSIS — M1712 Unilateral primary osteoarthritis, left knee: Secondary | ICD-10-CM | POA: Diagnosis not present

## 2019-11-24 DIAGNOSIS — M25562 Pain in left knee: Secondary | ICD-10-CM | POA: Diagnosis not present

## 2019-11-24 DIAGNOSIS — Z86718 Personal history of other venous thrombosis and embolism: Secondary | ICD-10-CM | POA: Diagnosis not present

## 2019-11-28 DIAGNOSIS — Z79899 Other long term (current) drug therapy: Secondary | ICD-10-CM | POA: Diagnosis not present

## 2019-12-17 DIAGNOSIS — M00062 Staphylococcal arthritis, left knee: Secondary | ICD-10-CM | POA: Diagnosis not present

## 2019-12-17 DIAGNOSIS — M00262 Other streptococcal arthritis, left knee: Secondary | ICD-10-CM | POA: Diagnosis not present

## 2019-12-17 DIAGNOSIS — G9341 Metabolic encephalopathy: Secondary | ICD-10-CM | POA: Diagnosis not present

## 2019-12-17 DIAGNOSIS — M25562 Pain in left knee: Secondary | ICD-10-CM | POA: Diagnosis not present

## 2019-12-17 DIAGNOSIS — M25462 Effusion, left knee: Secondary | ICD-10-CM | POA: Diagnosis not present

## 2019-12-17 DIAGNOSIS — E872 Acidosis: Secondary | ICD-10-CM | POA: Diagnosis not present

## 2019-12-17 DIAGNOSIS — R110 Nausea: Secondary | ICD-10-CM | POA: Diagnosis not present

## 2019-12-17 DIAGNOSIS — F6810 Factitious disorder, unspecified: Secondary | ICD-10-CM | POA: Diagnosis not present

## 2019-12-17 DIAGNOSIS — G8194 Hemiplegia, unspecified affecting left nondominant side: Secondary | ICD-10-CM | POA: Diagnosis not present

## 2019-12-17 DIAGNOSIS — R630 Anorexia: Secondary | ICD-10-CM | POA: Diagnosis not present

## 2019-12-17 DIAGNOSIS — Z20822 Contact with and (suspected) exposure to covid-19: Secondary | ICD-10-CM | POA: Diagnosis not present

## 2019-12-17 DIAGNOSIS — R000 Tachycardia, unspecified: Secondary | ICD-10-CM | POA: Diagnosis not present

## 2019-12-17 DIAGNOSIS — R509 Fever, unspecified: Secondary | ICD-10-CM | POA: Diagnosis not present

## 2019-12-18 DIAGNOSIS — M009 Pyogenic arthritis, unspecified: Secondary | ICD-10-CM | POA: Diagnosis not present

## 2019-12-18 DIAGNOSIS — M00862 Arthritis due to other bacteria, left knee: Secondary | ICD-10-CM | POA: Diagnosis not present

## 2019-12-18 DIAGNOSIS — F429 Obsessive-compulsive disorder, unspecified: Secondary | ICD-10-CM | POA: Diagnosis not present

## 2019-12-18 DIAGNOSIS — M00262 Other streptococcal arthritis, left knee: Secondary | ICD-10-CM | POA: Diagnosis not present

## 2019-12-18 DIAGNOSIS — Z89611 Acquired absence of right leg above knee: Secondary | ICD-10-CM | POA: Diagnosis not present

## 2019-12-18 DIAGNOSIS — Z881 Allergy status to other antibiotic agents status: Secondary | ICD-10-CM | POA: Diagnosis not present

## 2019-12-18 DIAGNOSIS — I6389 Other cerebral infarction: Secondary | ICD-10-CM | POA: Diagnosis not present

## 2019-12-18 DIAGNOSIS — G8929 Other chronic pain: Secondary | ICD-10-CM | POA: Diagnosis present

## 2019-12-18 DIAGNOSIS — Z79899 Other long term (current) drug therapy: Secondary | ICD-10-CM | POA: Diagnosis not present

## 2019-12-18 DIAGNOSIS — Z885 Allergy status to narcotic agent status: Secondary | ICD-10-CM | POA: Diagnosis not present

## 2019-12-18 DIAGNOSIS — I517 Cardiomegaly: Secondary | ICD-10-CM | POA: Diagnosis not present

## 2019-12-18 DIAGNOSIS — Z20822 Contact with and (suspected) exposure to covid-19: Secondary | ICD-10-CM | POA: Diagnosis present

## 2019-12-18 DIAGNOSIS — F985 Adult onset fluency disorder: Secondary | ICD-10-CM | POA: Diagnosis not present

## 2019-12-18 DIAGNOSIS — R7881 Bacteremia: Secondary | ICD-10-CM | POA: Diagnosis not present

## 2019-12-18 DIAGNOSIS — R509 Fever, unspecified: Secondary | ICD-10-CM | POA: Diagnosis not present

## 2019-12-18 DIAGNOSIS — K219 Gastro-esophageal reflux disease without esophagitis: Secondary | ICD-10-CM | POA: Diagnosis present

## 2019-12-18 DIAGNOSIS — Z91041 Radiographic dye allergy status: Secondary | ICD-10-CM | POA: Diagnosis not present

## 2019-12-18 DIAGNOSIS — M25552 Pain in left hip: Secondary | ICD-10-CM | POA: Diagnosis not present

## 2019-12-18 DIAGNOSIS — F6810 Factitious disorder, unspecified: Secondary | ICD-10-CM | POA: Diagnosis present

## 2019-12-18 DIAGNOSIS — D72829 Elevated white blood cell count, unspecified: Secondary | ICD-10-CM | POA: Diagnosis not present

## 2019-12-18 DIAGNOSIS — R000 Tachycardia, unspecified: Secondary | ICD-10-CM | POA: Diagnosis not present

## 2019-12-18 DIAGNOSIS — B9689 Other specified bacterial agents as the cause of diseases classified elsewhere: Secondary | ICD-10-CM | POA: Diagnosis not present

## 2019-12-18 DIAGNOSIS — N179 Acute kidney failure, unspecified: Secondary | ICD-10-CM | POA: Diagnosis not present

## 2019-12-18 DIAGNOSIS — R531 Weakness: Secondary | ICD-10-CM | POA: Diagnosis not present

## 2019-12-18 DIAGNOSIS — B963 Hemophilus influenzae [H. influenzae] as the cause of diseases classified elsewhere: Secondary | ICD-10-CM | POA: Diagnosis not present

## 2019-12-18 DIAGNOSIS — M25562 Pain in left knee: Secondary | ICD-10-CM | POA: Diagnosis not present

## 2019-12-18 DIAGNOSIS — Z9049 Acquired absence of other specified parts of digestive tract: Secondary | ICD-10-CM | POA: Diagnosis not present

## 2019-12-18 DIAGNOSIS — G9341 Metabolic encephalopathy: Secondary | ICD-10-CM | POA: Diagnosis not present

## 2019-12-18 DIAGNOSIS — G8918 Other acute postprocedural pain: Secondary | ICD-10-CM | POA: Diagnosis not present

## 2019-12-18 DIAGNOSIS — G8194 Hemiplegia, unspecified affecting left nondominant side: Secondary | ICD-10-CM | POA: Diagnosis present

## 2019-12-18 DIAGNOSIS — R471 Dysarthria and anarthria: Secondary | ICD-10-CM | POA: Diagnosis not present

## 2019-12-18 DIAGNOSIS — A4189 Other specified sepsis: Secondary | ICD-10-CM | POA: Diagnosis not present

## 2019-12-18 DIAGNOSIS — Z86718 Personal history of other venous thrombosis and embolism: Secondary | ICD-10-CM | POA: Diagnosis not present

## 2019-12-18 DIAGNOSIS — R200 Anesthesia of skin: Secondary | ICD-10-CM | POA: Diagnosis not present

## 2019-12-18 DIAGNOSIS — M25462 Effusion, left knee: Secondary | ICD-10-CM | POA: Diagnosis not present

## 2019-12-18 DIAGNOSIS — B966 Bacteroides fragilis [B. fragilis] as the cause of diseases classified elsewhere: Secondary | ICD-10-CM | POA: Diagnosis not present

## 2019-12-18 DIAGNOSIS — F419 Anxiety disorder, unspecified: Secondary | ICD-10-CM | POA: Diagnosis present

## 2019-12-18 DIAGNOSIS — B954 Other streptococcus as the cause of diseases classified elsewhere: Secondary | ICD-10-CM | POA: Diagnosis not present

## 2019-12-18 DIAGNOSIS — Z79891 Long term (current) use of opiate analgesic: Secondary | ICD-10-CM | POA: Diagnosis not present

## 2019-12-18 DIAGNOSIS — I10 Essential (primary) hypertension: Secondary | ICD-10-CM | POA: Diagnosis present

## 2019-12-18 DIAGNOSIS — Z8249 Family history of ischemic heart disease and other diseases of the circulatory system: Secondary | ICD-10-CM | POA: Diagnosis not present

## 2019-12-18 DIAGNOSIS — B348 Other viral infections of unspecified site: Secondary | ICD-10-CM | POA: Diagnosis not present

## 2019-12-18 DIAGNOSIS — G9340 Encephalopathy, unspecified: Secondary | ICD-10-CM | POA: Diagnosis not present

## 2019-12-18 DIAGNOSIS — Z880 Allergy status to penicillin: Secondary | ICD-10-CM | POA: Diagnosis not present

## 2019-12-18 DIAGNOSIS — E872 Acidosis: Secondary | ICD-10-CM | POA: Diagnosis present

## 2019-12-18 DIAGNOSIS — M00062 Staphylococcal arthritis, left knee: Secondary | ICD-10-CM | POA: Diagnosis present

## 2019-12-19 DIAGNOSIS — M009 Pyogenic arthritis, unspecified: Secondary | ICD-10-CM | POA: Diagnosis not present

## 2019-12-19 DIAGNOSIS — F419 Anxiety disorder, unspecified: Secondary | ICD-10-CM | POA: Diagnosis not present

## 2019-12-19 DIAGNOSIS — I10 Essential (primary) hypertension: Secondary | ICD-10-CM | POA: Diagnosis not present

## 2019-12-19 DIAGNOSIS — K219 Gastro-esophageal reflux disease without esophagitis: Secondary | ICD-10-CM | POA: Diagnosis not present

## 2019-12-20 DIAGNOSIS — K219 Gastro-esophageal reflux disease without esophagitis: Secondary | ICD-10-CM | POA: Diagnosis not present

## 2019-12-20 DIAGNOSIS — M00262 Other streptococcal arthritis, left knee: Secondary | ICD-10-CM | POA: Diagnosis not present

## 2019-12-20 DIAGNOSIS — F419 Anxiety disorder, unspecified: Secondary | ICD-10-CM | POA: Diagnosis not present

## 2019-12-20 DIAGNOSIS — M009 Pyogenic arthritis, unspecified: Secondary | ICD-10-CM | POA: Diagnosis not present

## 2019-12-20 DIAGNOSIS — B9689 Other specified bacterial agents as the cause of diseases classified elsewhere: Secondary | ICD-10-CM | POA: Diagnosis not present

## 2019-12-20 DIAGNOSIS — B963 Hemophilus influenzae [H. influenzae] as the cause of diseases classified elsewhere: Secondary | ICD-10-CM | POA: Diagnosis not present

## 2019-12-20 DIAGNOSIS — I10 Essential (primary) hypertension: Secondary | ICD-10-CM | POA: Diagnosis not present

## 2019-12-20 DIAGNOSIS — B954 Other streptococcus as the cause of diseases classified elsewhere: Secondary | ICD-10-CM | POA: Diagnosis not present

## 2019-12-21 DIAGNOSIS — R7881 Bacteremia: Secondary | ICD-10-CM | POA: Diagnosis not present

## 2019-12-21 DIAGNOSIS — I517 Cardiomegaly: Secondary | ICD-10-CM | POA: Diagnosis not present

## 2019-12-21 DIAGNOSIS — M00262 Other streptococcal arthritis, left knee: Secondary | ICD-10-CM | POA: Diagnosis not present

## 2019-12-21 DIAGNOSIS — M009 Pyogenic arthritis, unspecified: Secondary | ICD-10-CM | POA: Diagnosis not present

## 2019-12-21 DIAGNOSIS — K219 Gastro-esophageal reflux disease without esophagitis: Secondary | ICD-10-CM | POA: Diagnosis not present

## 2019-12-21 DIAGNOSIS — I10 Essential (primary) hypertension: Secondary | ICD-10-CM | POA: Diagnosis not present

## 2019-12-21 DIAGNOSIS — B9689 Other specified bacterial agents as the cause of diseases classified elsewhere: Secondary | ICD-10-CM | POA: Diagnosis not present

## 2019-12-21 DIAGNOSIS — M25562 Pain in left knee: Secondary | ICD-10-CM | POA: Diagnosis not present

## 2019-12-21 DIAGNOSIS — B954 Other streptococcus as the cause of diseases classified elsewhere: Secondary | ICD-10-CM | POA: Diagnosis not present

## 2019-12-22 DIAGNOSIS — M25462 Effusion, left knee: Secondary | ICD-10-CM | POA: Diagnosis not present

## 2019-12-22 DIAGNOSIS — M25562 Pain in left knee: Secondary | ICD-10-CM | POA: Diagnosis not present

## 2019-12-22 DIAGNOSIS — B963 Hemophilus influenzae [H. influenzae] as the cause of diseases classified elsewhere: Secondary | ICD-10-CM | POA: Diagnosis not present

## 2019-12-22 DIAGNOSIS — B954 Other streptococcus as the cause of diseases classified elsewhere: Secondary | ICD-10-CM | POA: Diagnosis not present

## 2019-12-22 DIAGNOSIS — B9689 Other specified bacterial agents as the cause of diseases classified elsewhere: Secondary | ICD-10-CM | POA: Diagnosis not present

## 2019-12-22 DIAGNOSIS — R509 Fever, unspecified: Secondary | ICD-10-CM | POA: Diagnosis not present

## 2019-12-22 DIAGNOSIS — K219 Gastro-esophageal reflux disease without esophagitis: Secondary | ICD-10-CM | POA: Diagnosis not present

## 2019-12-22 DIAGNOSIS — M009 Pyogenic arthritis, unspecified: Secondary | ICD-10-CM | POA: Diagnosis not present

## 2019-12-22 DIAGNOSIS — I10 Essential (primary) hypertension: Secondary | ICD-10-CM | POA: Diagnosis not present

## 2019-12-22 DIAGNOSIS — R7881 Bacteremia: Secondary | ICD-10-CM | POA: Diagnosis not present

## 2019-12-22 DIAGNOSIS — M00262 Other streptococcal arthritis, left knee: Secondary | ICD-10-CM | POA: Diagnosis not present

## 2019-12-23 DIAGNOSIS — M00862 Arthritis due to other bacteria, left knee: Secondary | ICD-10-CM | POA: Diagnosis not present

## 2019-12-23 DIAGNOSIS — M009 Pyogenic arthritis, unspecified: Secondary | ICD-10-CM | POA: Diagnosis not present

## 2019-12-23 DIAGNOSIS — I10 Essential (primary) hypertension: Secondary | ICD-10-CM | POA: Diagnosis not present

## 2019-12-23 DIAGNOSIS — R7881 Bacteremia: Secondary | ICD-10-CM | POA: Diagnosis not present

## 2019-12-23 DIAGNOSIS — M00062 Staphylococcal arthritis, left knee: Secondary | ICD-10-CM | POA: Diagnosis not present

## 2019-12-23 DIAGNOSIS — B954 Other streptococcus as the cause of diseases classified elsewhere: Secondary | ICD-10-CM | POA: Diagnosis not present

## 2019-12-23 DIAGNOSIS — K219 Gastro-esophageal reflux disease without esophagitis: Secondary | ICD-10-CM | POA: Diagnosis not present

## 2019-12-24 DIAGNOSIS — M009 Pyogenic arthritis, unspecified: Secondary | ICD-10-CM | POA: Diagnosis not present

## 2019-12-24 DIAGNOSIS — K219 Gastro-esophageal reflux disease without esophagitis: Secondary | ICD-10-CM | POA: Diagnosis not present

## 2019-12-24 DIAGNOSIS — B954 Other streptococcus as the cause of diseases classified elsewhere: Secondary | ICD-10-CM | POA: Diagnosis not present

## 2019-12-24 DIAGNOSIS — I10 Essential (primary) hypertension: Secondary | ICD-10-CM | POA: Diagnosis not present

## 2019-12-24 DIAGNOSIS — R7881 Bacteremia: Secondary | ICD-10-CM | POA: Diagnosis not present

## 2019-12-25 DIAGNOSIS — B954 Other streptococcus as the cause of diseases classified elsewhere: Secondary | ICD-10-CM | POA: Diagnosis not present

## 2019-12-25 DIAGNOSIS — M25562 Pain in left knee: Secondary | ICD-10-CM | POA: Diagnosis not present

## 2019-12-25 DIAGNOSIS — R7881 Bacteremia: Secondary | ICD-10-CM | POA: Diagnosis not present

## 2019-12-25 DIAGNOSIS — M009 Pyogenic arthritis, unspecified: Secondary | ICD-10-CM | POA: Diagnosis not present

## 2019-12-25 DIAGNOSIS — K219 Gastro-esophageal reflux disease without esophagitis: Secondary | ICD-10-CM | POA: Diagnosis not present

## 2019-12-25 DIAGNOSIS — I10 Essential (primary) hypertension: Secondary | ICD-10-CM | POA: Diagnosis not present

## 2019-12-25 DIAGNOSIS — M25552 Pain in left hip: Secondary | ICD-10-CM | POA: Diagnosis not present

## 2019-12-26 DIAGNOSIS — R7881 Bacteremia: Secondary | ICD-10-CM | POA: Diagnosis not present

## 2019-12-26 DIAGNOSIS — M00262 Other streptococcal arthritis, left knee: Secondary | ICD-10-CM | POA: Diagnosis not present

## 2019-12-26 DIAGNOSIS — B954 Other streptococcus as the cause of diseases classified elsewhere: Secondary | ICD-10-CM | POA: Diagnosis not present

## 2019-12-26 DIAGNOSIS — A4189 Other specified sepsis: Secondary | ICD-10-CM | POA: Diagnosis not present

## 2019-12-26 DIAGNOSIS — M00862 Arthritis due to other bacteria, left knee: Secondary | ICD-10-CM | POA: Diagnosis not present

## 2019-12-26 DIAGNOSIS — B9689 Other specified bacterial agents as the cause of diseases classified elsewhere: Secondary | ICD-10-CM | POA: Diagnosis not present

## 2019-12-27 DIAGNOSIS — G8929 Other chronic pain: Secondary | ICD-10-CM | POA: Diagnosis not present

## 2019-12-27 DIAGNOSIS — B348 Other viral infections of unspecified site: Secondary | ICD-10-CM | POA: Diagnosis not present

## 2019-12-27 DIAGNOSIS — B954 Other streptococcus as the cause of diseases classified elsewhere: Secondary | ICD-10-CM | POA: Diagnosis not present

## 2019-12-27 DIAGNOSIS — M00862 Arthritis due to other bacteria, left knee: Secondary | ICD-10-CM | POA: Diagnosis not present

## 2019-12-28 DIAGNOSIS — B954 Other streptococcus as the cause of diseases classified elsewhere: Secondary | ICD-10-CM | POA: Diagnosis not present

## 2019-12-28 DIAGNOSIS — M00862 Arthritis due to other bacteria, left knee: Secondary | ICD-10-CM | POA: Diagnosis not present

## 2019-12-28 DIAGNOSIS — G9340 Encephalopathy, unspecified: Secondary | ICD-10-CM | POA: Diagnosis not present

## 2019-12-28 DIAGNOSIS — R7881 Bacteremia: Secondary | ICD-10-CM | POA: Diagnosis not present

## 2019-12-28 DIAGNOSIS — A4189 Other specified sepsis: Secondary | ICD-10-CM | POA: Diagnosis not present

## 2019-12-29 DIAGNOSIS — G9340 Encephalopathy, unspecified: Secondary | ICD-10-CM | POA: Diagnosis not present

## 2020-01-11 ENCOUNTER — Emergency Department (HOSPITAL_COMMUNITY): Payer: Medicare Other

## 2020-01-11 ENCOUNTER — Other Ambulatory Visit: Payer: Self-pay

## 2020-01-11 ENCOUNTER — Inpatient Hospital Stay (HOSPITAL_COMMUNITY)
Admission: EM | Admit: 2020-01-11 | Discharge: 2020-02-01 | DRG: 463 | Disposition: A | Discharge status: Skilled Nursing Facility | Payer: Medicare Other | Attending: Internal Medicine | Admitting: Internal Medicine

## 2020-01-11 ENCOUNTER — Encounter (HOSPITAL_COMMUNITY): Payer: Self-pay

## 2020-01-11 DIAGNOSIS — G8929 Other chronic pain: Secondary | ICD-10-CM | POA: Diagnosis present

## 2020-01-11 DIAGNOSIS — Z8249 Family history of ischemic heart disease and other diseases of the circulatory system: Secondary | ICD-10-CM | POA: Diagnosis not present

## 2020-01-11 DIAGNOSIS — M25562 Pain in left knee: Secondary | ICD-10-CM | POA: Diagnosis not present

## 2020-01-11 DIAGNOSIS — D62 Acute posthemorrhagic anemia: Secondary | ICD-10-CM | POA: Diagnosis not present

## 2020-01-11 DIAGNOSIS — Z825 Family history of asthma and other chronic lower respiratory diseases: Secondary | ICD-10-CM

## 2020-01-11 DIAGNOSIS — I878 Other specified disorders of veins: Secondary | ICD-10-CM

## 2020-01-11 DIAGNOSIS — Z20822 Contact with and (suspected) exposure to covid-19: Secondary | ICD-10-CM | POA: Diagnosis present

## 2020-01-11 DIAGNOSIS — W1830XA Fall on same level, unspecified, initial encounter: Secondary | ICD-10-CM | POA: Diagnosis present

## 2020-01-11 DIAGNOSIS — D649 Anemia, unspecified: Secondary | ICD-10-CM | POA: Diagnosis not present

## 2020-01-11 DIAGNOSIS — R7989 Other specified abnormal findings of blood chemistry: Secondary | ICD-10-CM | POA: Diagnosis not present

## 2020-01-11 DIAGNOSIS — M00862 Arthritis due to other bacteria, left knee: Principal | ICD-10-CM | POA: Diagnosis present

## 2020-01-11 DIAGNOSIS — S0990XA Unspecified injury of head, initial encounter: Secondary | ICD-10-CM | POA: Diagnosis not present

## 2020-01-11 DIAGNOSIS — R079 Chest pain, unspecified: Secondary | ICD-10-CM | POA: Diagnosis present

## 2020-01-11 DIAGNOSIS — Z789 Other specified health status: Secondary | ICD-10-CM

## 2020-01-11 DIAGNOSIS — R7881 Bacteremia: Secondary | ICD-10-CM | POA: Diagnosis present

## 2020-01-11 DIAGNOSIS — A419 Sepsis, unspecified organism: Secondary | ICD-10-CM | POA: Diagnosis not present

## 2020-01-11 DIAGNOSIS — Z041 Encounter for examination and observation following transport accident: Secondary | ICD-10-CM | POA: Diagnosis not present

## 2020-01-11 DIAGNOSIS — M00872 Arthritis due to other bacteria, left ankle and foot: Secondary | ICD-10-CM | POA: Diagnosis not present

## 2020-01-11 DIAGNOSIS — R519 Headache, unspecified: Secondary | ICD-10-CM | POA: Diagnosis not present

## 2020-01-11 DIAGNOSIS — Z888 Allergy status to other drugs, medicaments and biological substances status: Secondary | ICD-10-CM | POA: Diagnosis not present

## 2020-01-11 DIAGNOSIS — Y838 Other surgical procedures as the cause of abnormal reaction of the patient, or of later complication, without mention of misadventure at the time of the procedure: Secondary | ICD-10-CM | POA: Diagnosis not present

## 2020-01-11 DIAGNOSIS — E876 Hypokalemia: Secondary | ICD-10-CM | POA: Diagnosis not present

## 2020-01-11 DIAGNOSIS — Z885 Allergy status to narcotic agent status: Secondary | ICD-10-CM | POA: Diagnosis not present

## 2020-01-11 DIAGNOSIS — Z452 Encounter for adjustment and management of vascular access device: Secondary | ICD-10-CM | POA: Diagnosis not present

## 2020-01-11 DIAGNOSIS — L02416 Cutaneous abscess of left lower limb: Secondary | ICD-10-CM | POA: Diagnosis present

## 2020-01-11 DIAGNOSIS — I2699 Other pulmonary embolism without acute cor pulmonale: Secondary | ICD-10-CM

## 2020-01-11 DIAGNOSIS — R52 Pain, unspecified: Secondary | ICD-10-CM | POA: Diagnosis not present

## 2020-01-11 DIAGNOSIS — R109 Unspecified abdominal pain: Secondary | ICD-10-CM | POA: Diagnosis not present

## 2020-01-11 DIAGNOSIS — Z833 Family history of diabetes mellitus: Secondary | ICD-10-CM

## 2020-01-11 DIAGNOSIS — M1712 Unilateral primary osteoarthritis, left knee: Secondary | ICD-10-CM | POA: Diagnosis not present

## 2020-01-11 DIAGNOSIS — Z89611 Acquired absence of right leg above knee: Secondary | ICD-10-CM

## 2020-01-11 DIAGNOSIS — Z9049 Acquired absence of other specified parts of digestive tract: Secondary | ICD-10-CM

## 2020-01-11 DIAGNOSIS — Z91041 Radiographic dye allergy status: Secondary | ICD-10-CM | POA: Diagnosis not present

## 2020-01-11 DIAGNOSIS — Z5329 Procedure and treatment not carried out because of patient's decision for other reasons: Secondary | ICD-10-CM | POA: Diagnosis not present

## 2020-01-11 DIAGNOSIS — Z049 Encounter for examination and observation for unspecified reason: Secondary | ICD-10-CM | POA: Diagnosis not present

## 2020-01-11 DIAGNOSIS — M7542 Impingement syndrome of left shoulder: Secondary | ICD-10-CM | POA: Diagnosis not present

## 2020-01-11 DIAGNOSIS — I2609 Other pulmonary embolism with acute cor pulmonale: Secondary | ICD-10-CM | POA: Diagnosis not present

## 2020-01-11 DIAGNOSIS — Z881 Allergy status to other antibiotic agents status: Secondary | ICD-10-CM

## 2020-01-11 DIAGNOSIS — R5381 Other malaise: Secondary | ICD-10-CM | POA: Diagnosis not present

## 2020-01-11 DIAGNOSIS — R000 Tachycardia, unspecified: Secondary | ICD-10-CM | POA: Diagnosis not present

## 2020-01-11 DIAGNOSIS — F6810 Factitious disorder, unspecified: Secondary | ICD-10-CM | POA: Diagnosis present

## 2020-01-11 DIAGNOSIS — F419 Anxiety disorder, unspecified: Secondary | ICD-10-CM | POA: Diagnosis not present

## 2020-01-11 DIAGNOSIS — M009 Pyogenic arthritis, unspecified: Secondary | ICD-10-CM | POA: Diagnosis not present

## 2020-01-11 DIAGNOSIS — D500 Iron deficiency anemia secondary to blood loss (chronic): Secondary | ICD-10-CM | POA: Diagnosis not present

## 2020-01-11 DIAGNOSIS — M868X6 Other osteomyelitis, lower leg: Secondary | ICD-10-CM | POA: Diagnosis present

## 2020-01-11 DIAGNOSIS — R778 Other specified abnormalities of plasma proteins: Secondary | ICD-10-CM | POA: Diagnosis present

## 2020-01-11 DIAGNOSIS — Z8614 Personal history of Methicillin resistant Staphylococcus aureus infection: Secondary | ICD-10-CM

## 2020-01-11 DIAGNOSIS — M869 Osteomyelitis, unspecified: Secondary | ICD-10-CM | POA: Diagnosis not present

## 2020-01-11 DIAGNOSIS — Z813 Family history of other psychoactive substance abuse and dependence: Secondary | ICD-10-CM

## 2020-01-11 DIAGNOSIS — M1258 Traumatic arthropathy, other specified site: Secondary | ICD-10-CM | POA: Diagnosis present

## 2020-01-11 DIAGNOSIS — I10 Essential (primary) hypertension: Secondary | ICD-10-CM | POA: Diagnosis not present

## 2020-01-11 DIAGNOSIS — Z79899 Other long term (current) drug therapy: Secondary | ICD-10-CM

## 2020-01-11 DIAGNOSIS — I493 Ventricular premature depolarization: Secondary | ICD-10-CM | POA: Diagnosis not present

## 2020-01-11 DIAGNOSIS — I959 Hypotension, unspecified: Secondary | ICD-10-CM | POA: Diagnosis not present

## 2020-01-11 DIAGNOSIS — M0000 Staphylococcal arthritis, unspecified joint: Secondary | ICD-10-CM

## 2020-01-11 DIAGNOSIS — Z91013 Allergy to seafood: Secondary | ICD-10-CM | POA: Diagnosis not present

## 2020-01-11 DIAGNOSIS — L7632 Postprocedural hematoma of skin and subcutaneous tissue following other procedure: Secondary | ICD-10-CM | POA: Diagnosis not present

## 2020-01-11 DIAGNOSIS — R55 Syncope and collapse: Secondary | ICD-10-CM

## 2020-01-11 DIAGNOSIS — Z7401 Bed confinement status: Secondary | ICD-10-CM | POA: Diagnosis not present

## 2020-01-11 DIAGNOSIS — M2550 Pain in unspecified joint: Secondary | ICD-10-CM | POA: Diagnosis not present

## 2020-01-11 DIAGNOSIS — R1032 Left lower quadrant pain: Secondary | ICD-10-CM | POA: Diagnosis not present

## 2020-01-11 DIAGNOSIS — G546 Phantom limb syndrome with pain: Secondary | ICD-10-CM | POA: Diagnosis present

## 2020-01-11 DIAGNOSIS — K625 Hemorrhage of anus and rectum: Secondary | ICD-10-CM | POA: Diagnosis present

## 2020-01-11 DIAGNOSIS — M25569 Pain in unspecified knee: Secondary | ICD-10-CM | POA: Diagnosis not present

## 2020-01-11 DIAGNOSIS — Z96641 Presence of right artificial hip joint: Secondary | ICD-10-CM | POA: Diagnosis present

## 2020-01-11 DIAGNOSIS — F1120 Opioid dependence, uncomplicated: Secondary | ICD-10-CM | POA: Diagnosis present

## 2020-01-11 DIAGNOSIS — R0789 Other chest pain: Secondary | ICD-10-CM | POA: Diagnosis not present

## 2020-01-11 DIAGNOSIS — M25462 Effusion, left knee: Secondary | ICD-10-CM | POA: Diagnosis not present

## 2020-01-11 LAB — CBC WITH DIFFERENTIAL/PLATELET
Abs Immature Granulocytes: 0.09 10*3/uL — ABNORMAL HIGH (ref 0.00–0.07)
Basophils Absolute: 0.1 10*3/uL (ref 0.0–0.1)
Basophils Relative: 1 %
Eosinophils Absolute: 0.2 10*3/uL (ref 0.0–0.5)
Eosinophils Relative: 1 %
HCT: 31.7 % — ABNORMAL LOW (ref 39.0–52.0)
Hemoglobin: 9 g/dL — ABNORMAL LOW (ref 13.0–17.0)
Immature Granulocytes: 1 %
Lymphocytes Relative: 9 %
Lymphs Abs: 1.1 10*3/uL (ref 0.7–4.0)
MCH: 23.7 pg — ABNORMAL LOW (ref 26.0–34.0)
MCHC: 28.4 g/dL — ABNORMAL LOW (ref 30.0–36.0)
MCV: 83.4 fL (ref 80.0–100.0)
Monocytes Absolute: 0.8 10*3/uL (ref 0.1–1.0)
Monocytes Relative: 6 %
Neutro Abs: 9.9 10*3/uL — ABNORMAL HIGH (ref 1.7–7.7)
Neutrophils Relative %: 82 %
Platelets: 497 10*3/uL — ABNORMAL HIGH (ref 150–400)
RBC: 3.8 MIL/uL — ABNORMAL LOW (ref 4.22–5.81)
RDW: 17.4 % — ABNORMAL HIGH (ref 11.5–15.5)
WBC: 12 10*3/uL — ABNORMAL HIGH (ref 4.0–10.5)
nRBC: 0 % (ref 0.0–0.2)

## 2020-01-11 LAB — COMPREHENSIVE METABOLIC PANEL
ALT: 18 U/L (ref 0–44)
AST: 18 U/L (ref 15–41)
Albumin: 2.8 g/dL — ABNORMAL LOW (ref 3.5–5.0)
Alkaline Phosphatase: 102 U/L (ref 38–126)
Anion gap: 16 — ABNORMAL HIGH (ref 5–15)
BUN: 20 mg/dL (ref 6–20)
CO2: 21 mmol/L — ABNORMAL LOW (ref 22–32)
Calcium: 9.3 mg/dL (ref 8.9–10.3)
Chloride: 100 mmol/L (ref 98–111)
Creatinine, Ser: 1.09 mg/dL (ref 0.61–1.24)
GFR, Estimated: >60 mL/min (ref >60)
Glucose, Bld: 81 mg/dL (ref 70–99)
Potassium: 3.7 mmol/L (ref 3.5–5.1)
Sodium: 137 mmol/L (ref 135–145)
Total Bilirubin: 1.5 mg/dL — ABNORMAL HIGH (ref 0.3–1.2)
Total Protein: 8.1 g/dL (ref 6.5–8.1)

## 2020-01-11 LAB — URINALYSIS, ROUTINE W REFLEX MICROSCOPIC
Bilirubin Urine: NEGATIVE
Glucose, UA: 150 mg/dL — AB
Hgb urine dipstick: NEGATIVE
Ketones, ur: 20 mg/dL — AB
Leukocytes,Ua: NEGATIVE
Nitrite: NEGATIVE
Protein, ur: 30 mg/dL — AB
Specific Gravity, Urine: 1.024 (ref 1.005–1.030)
pH: 5 (ref 5.0–8.0)

## 2020-01-11 LAB — RESPIRATORY PANEL BY RT PCR (FLU A&B, COVID)
Influenza A by PCR: NEGATIVE
Influenza B by PCR: NEGATIVE
SARS Coronavirus 2 by RT PCR: NEGATIVE

## 2020-01-11 LAB — TROPONIN I (HIGH SENSITIVITY)
Troponin I (High Sensitivity): 77 ng/L — ABNORMAL HIGH (ref <18)
Troponin I (High Sensitivity): 93 ng/L — ABNORMAL HIGH (ref <18)

## 2020-01-11 LAB — RAPID URINE DRUG SCREEN, HOSP PERFORMED
Amphetamines: NOT DETECTED
Barbiturates: NOT DETECTED
Benzodiazepines: POSITIVE — AB
Cocaine: NOT DETECTED
Opiates: NOT DETECTED
Tetrahydrocannabinol: NOT DETECTED

## 2020-01-11 LAB — CBG MONITORING, ED
Glucose-Capillary: 66 mg/dL — ABNORMAL LOW (ref 70–99)
Glucose-Capillary: 77 mg/dL (ref 70–99)

## 2020-01-11 LAB — OCCULT BLOOD X 1 CARD TO LAB, STOOL: Fecal Occult Bld: NEGATIVE

## 2020-01-11 MED ORDER — CLONAZEPAM 0.5 MG PO TABS
0.5000 mg | ORAL_TABLET | Freq: Once | ORAL | Status: AC
  Administered 2020-01-11: 0.5 mg via ORAL
  Filled 2020-01-11: qty 1

## 2020-01-11 MED ORDER — HYDROMORPHONE HCL 1 MG/ML IJ SOLN
0.5000 mg | Freq: Once | INTRAMUSCULAR | Status: AC
  Administered 2020-01-11: 0.5 mg via INTRAVENOUS
  Filled 2020-01-11: qty 1

## 2020-01-11 MED ORDER — SODIUM CHLORIDE 0.9 % IV BOLUS
1000.0000 mL | Freq: Once | INTRAVENOUS | Status: AC
  Administered 2020-01-11: 1000 mL via INTRAVENOUS

## 2020-01-11 MED ORDER — DEXTROSE 50 % IV SOLN
50.0000 mL | Freq: Once | INTRAVENOUS | Status: AC
  Administered 2020-01-11: 50 mL via INTRAVENOUS
  Filled 2020-01-11: qty 50

## 2020-01-11 NOTE — ED Notes (Signed)
Pt now endorsing bright red rectal bleeding x 2 weeks after mentioning to him that his Hgb is low. Notified NP and MD.

## 2020-01-11 NOTE — ED Notes (Signed)
Portable xray at bedside.

## 2020-01-11 NOTE — ED Provider Notes (Signed)
[facility] EMERGENCY DEPARTMENT Provider Note   CSN: [id number] Arrival date & time: 01/11/20  1731     History Chief Complaint  Patient presents with  . Loss of Consciousness  . Chest Pain  . Knee Pain    Jason [name] is a 54 y.o. male.  Patient arrives via [facility] after a syncopal episode and a fall during PT today. Patient was recently discharged from [facility] after stay for septic arthritis of left knee. Patient states he completely lost consciousness, hit is head and left knee during the fall. Records from recent visits at [facility] reviewed.    The history is provided by the patient. No language interpreter was used.  Loss of Consciousness Episode history:  Single Most recent episode:  Today Progression:  Resolved Chronicity:  New Context: standing up   Associated symptoms: chest pain, recent fall and weakness   Chest Pain Associated symptoms: syncope and weakness   Knee Pain      Past Medical History:  Diagnosis Date  . Allergy   . Anxiety   . Arthritis    left shoulder  . Bronchitis   . Chest pain 07/14/2016  . Complication of anesthesia   . DJD (degenerative joint disease) 11/29/2011  . Hypertension   . Neuromuscular disorder ([place])    carpal tunnel bilateral, ulner nerve surgery  . PONV (postoperative nausea and vomiting)   . Septic arthritis of knee, right ([place]) 11/29/2011  . Spinal headache    "long time ago"    Patient Active Problem List   Diagnosis Date Noted  . Pain management contract broken 09/13/2018  . Benzodiazepine dependence, continuous ([place]) 12/25/2017  . Amputation stump pain ([place]) 11/18/2017  . Opiate dependence ([place]) 05/06/2017  . Primary insomnia 05/06/2017  . Functional diarrhea 05/06/2017  . Phantom limb pain ([place]) 05/06/2017  . Muscle atrophy of lower extremity 05/06/2017  . Other chronic pain 05/06/2017  . Essential hypertension 07/14/2016  . S/P AKA (above knee amputation) unilateral, right ([place])  07/14/2016  . Shoulder impingement syndrome, left 07/29/2011    Past Surgical History:  Procedure Laterality Date  . 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 APPENDECTOMY    . CHOLECYSTECTOMY    . IRRIGATION AND DEBRIDEMENT KNEE  11/26/2011   Procedure: IRRIGATION AND DEBRIDEMENT KNEE;  Surgeon: Kerin Salen, MD;  Location: [place];  Service: Orthopedics;  Laterality: Right;  . KNEE ARTHROSCOPY  10/24/2011   Procedure: ARTHROSCOPY KNEE;  Surgeon: Kerin Salen, MD;  Location: [place];  Service: Orthopedics;  Laterality: Right;  . KNEE SURGERY     7 knee surgeries on right, and 4 knee surgeries left  . LEFT HEART CATH AND CORONARY ANGIOGRAPHY N/A 07/14/2016   Procedure: Left Heart Cath and Coronary Angiography;  Surgeon: Belva Crome, MD;  Location: [place] CV LAB;  Service: Cardiovascular;  Laterality: N/A;  . SHOULDER ARTHROSCOPY  07/29/2011   Procedure: ARTHROSCOPY SHOULDER;  Surgeon: Mcarthur Rossetti, MD;  Location: [place];  Service: Orthopedics;  Laterality: Left;  Left shoulder arthroscopy with minimal debridement, left wrist steroid injection  . SHOULDER SURGERY     3 surgeries on right, 2 surgeries on left  . ULNAR NERVE REPAIR         Family History  Problem Relation Age of Onset  . Heart attack Father   . COPD Father   . Diabetes Father   . Hypertension Father   . Heart attack Sister   .  Arrhythmia Sister        Long QT syndrome, PPM  . Hypertension Mother   . Drug abuse Brother     Social History   Tobacco Use  . Smoking status: Never Smoker  . Smokeless tobacco: Never Used  Vaping Use  . Vaping Use: Never used  Substance Use Topics  . Alcohol use: No  . Drug use: No    Home Medications Prior to Admission medications   Medication Sig Start Date End Date Taking? Authorizing Provider  ALPRAZolam (XANAX) 1 MG tablet TAKE (1) TABLET BY MOUTH THREE TIMES DAILY AS NEEDED FOR ANXIETY OR AGITATION. Patient taking differently: Take 1 mg by mouth 3  (three) times daily as needed for anxiety.  03/22/18  Yes Claretta Fraise, MD  EPINEPHrine 0.3 mg/0.3 mL IJ SOAJ injection Inject 0.3 mg into the muscle once as needed for anaphylaxis. 05/28/16  Yes [provider]  ondansetron (ZOFRAN-ODT) 4 MG disintegrating tablet DISSOLVE (1) TABLET BY MOUTH EVERY 8 HOURS AS NEEDED FOR UP TO 7 DAYS. Patient taking differently: Take 4 mg by mouth every 8 (eight) hours as needed for nausea or vomiting.  12/25/17  Yes Claretta Fraise, MD  zolpidem (AMBIEN CR) 12.5 MG CR tablet Take 2 tablets (25 mg total) by mouth at bedtime. Take 1 by mouth QHS for early awakening. Patient taking differently: Take 25 mg by mouth at bedtime.  03/22/18  Yes Claretta Fraise, MD  oxyCODONE (ROXICODONE) 15 MG immediate release tablet Take 1 tablet (15 mg total) by mouth every 6 (six) hours as needed for pain. Patient not taking: Reported on 01/11/2020 05/06/17   Claretta Fraise, MD  tobramycin-dexamethasone [facility]) ophthalmic solution Apply 1 drop in affected eye(s) every 2 hours for two days. Then every 4 hours for 5 days. Patient not taking: Reported on 01/11/2020 03/29/18   Claretta Fraise, MD    Allergies    Iodine, Ivp dye [iodinated diagnostic agents], Metrizamide, Other, Reglan [metoclopramide], Shellfish-derived products, Toradol [ketorolac tromethamine], Lidocaine, Morphine, Vancomycin, Codeine, Methadone, Propoxyphene, Amoxicillin, Penicillins, and Tape  Review of Systems   Review of Systems  Cardiovascular: Positive for chest pain and syncope.  Musculoskeletal: Positive for arthralgias and joint swelling.  Neurological: Positive for weakness.  All other systems reviewed and are negative.   Physical Exam Updated Vital Signs BP (!) 123/92 (BP Location: Left Arm)   Pulse (!) 101   Temp 98.7 F (37.1 C) (Oral)   Resp 15   Ht 5\' 10"  (1.778 m)   Wt 86.6 kg   SpO2 98%   BMI 27.41 kg/m   Physical Exam Vitals reviewed.  Constitutional:      Appearance: He is  well-developed.  HENT:     Head: Atraumatic.     Nose: Nose normal.  Cardiovascular:     Rate and Rhythm: Normal rate and regular rhythm.     Heart sounds: Normal heart sounds.  Pulmonary:     Effort: Pulmonary effort is normal.     Breath sounds: Normal breath sounds.  Chest:     Chest wall: No tenderness.  Abdominal:     Palpations: Abdomen is soft.  Musculoskeletal:        General: Swelling and tenderness present.     Cervical back: Normal range of motion and neck supple.     Left lower leg: Tenderness present.     Comments: Right AKA. Left knee moderately swollen and warm to touch. Multiple scars noted on left knee. Strong dorsalis pedis pulse palpated.  Skin:    General: Skin is warm and dry.  Neurological:     Mental Status: He is alert and oriented to person, place, and time.  Psychiatric:        Behavior: Behavior normal.     ED Results / Procedures / Treatments   Labs (all labs ordered are listed, but only abnormal results are displayed) Labs Reviewed  CBG MONITORING, ED - Abnormal; Notable for the following components:      Result Value   Glucose-Capillary 66 (*)    All other components within normal limits  CBC WITH DIFFERENTIAL/PLATELET  COMPREHENSIVE METABOLIC PANEL  TROPONIN I (HIGH SENSITIVITY)    EKG EKG Interpretation  Date/Time:  Wednesday January 11 2020 17:36:53 EDT Ventricular Rate:  107 PR Interval:    QRS Duration: 101 QT Interval:  335 QTC Calculation: 447 R Axis:   52 Text Interpretation: Sinus tachycardia Low voltage, precordial leads No significant change since last tracing Confirmed by Wandra Arthurs [phone]) on 01/11/2020 6:18:21 PM   Radiology No results found.  Procedures Procedures (including critical care time)  Medications Ordered in ED Medications  dextrose 50 % solution 50 mL (has no administration in time range)  sodium chloride 0.9 % bolus 1,000 mL (has no administration in time range)    ED Course  I have reviewed  the triage vital signs and the nursing notes.  Pertinent labs & imaging results that were available during my care of the patient were reviewed by me and considered in my medical decision making (see chart for details).   Patient to be admitted to unassigned medicine due to syncope and chest pain with elevated, rising troponin (77-->93). Stable anemia. No rectal blood on exam, hemoccult negative. Normal head CT and CXR.  Patient without chest pain at present. No ischemic changes noted on ECG.  Consult with orthopedics ([place]). Will see in consultation for likely septic arthritis of left knee. CT of left knee pending.  No antibiotic recommended at this time. Please place patient on NPO status after midnight in case surgical intervention is needed in the morning. MDM Rules/Calculators/A&P                          Patient accepted for admission by hospitalist service [facility]) Final Clinical Impression(s) / ED Diagnoses Final diagnoses:  Syncope, unspecified syncope type  Pyogenic arthritis of left knee joint, due to unspecified organism [facility])    Rx / DC Orders ED Discharge Orders    None       Etta Quill, NP 01/11/20 [id number]    Drenda Freeze, MD 01/14/20 [phone]

## 2020-01-11 NOTE — ED Triage Notes (Signed)
Pt arrived to ED via [facility] w/ multiple complaints including the following: fall, syncope, chest pain, knee pain. Pt reports he was doing his PT when he stood up, felt lightheaded, L leg gave out and he fell on the floor w/ impact to his L knee. Pt reports LOC and that when he awoke he had chest heaviness on the L side with radiation to L shoulder and back. EMS gave 3 nitro sprays w/ no relief of symptoms.

## 2020-01-11 NOTE — ED Notes (Signed)
Urine culture sent down with u/a 

## 2020-01-11 NOTE — ED Notes (Signed)
Patient transported to CT 

## 2020-01-11 NOTE — ED Notes (Signed)
Patient back from CT.

## 2020-01-11 NOTE — ED Notes (Signed)
IV team at bedside 

## 2020-01-11 NOTE — ED Notes (Signed)
Called staffing for a [employer] cabin crew. Per staffing no sitters available at this time.

## 2020-01-11 NOTE — ED Notes (Signed)
Pt provided w/ OJ for his CBG of 66 and pt refusing OJ

## 2020-01-11 NOTE — H&P (Signed)
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 maintain oral hydration    Need for operative/procedural  intervention   Need for IV fluids,  IV pain medications,      Level of care    tele  For 24H    Lab Results  Component Value Date   [place] 01/11/2020     Precautions: admitted as  Covid Negative     PPE: Used by the provider:   P100  eye Goggles,  Gloves     Junette Bernat 01/12/2020, 12:59 AM    [facility]     after 2 AM please page floor coverage PA If 7AM-7PM, please contact the day team taking care of the patient using [email or web address]   Patient was evaluated in the context of the global COVID-19  pandemic, which necessitated consideration that the patient might be at risk for infection with the SARS-CoV-2 virus that causes COVID-19. Institutional protocols and algorithms that pertain to the evaluation of patients at risk for COVID-19 are in  a state of rapid change based on information released by regulatory bodies including the CDC and federal and state organizations. These policies and algorithms were followed during the patient's care.

## 2020-01-12 ENCOUNTER — Other Ambulatory Visit (HOSPITAL_COMMUNITY): Payer: Medicare Other

## 2020-01-12 ENCOUNTER — Inpatient Hospital Stay (HOSPITAL_COMMUNITY): Payer: Medicare Other | Admitting: Anesthesiology

## 2020-01-12 ENCOUNTER — Encounter (HOSPITAL_COMMUNITY): Payer: Self-pay | Admitting: Internal Medicine

## 2020-01-12 ENCOUNTER — Inpatient Hospital Stay (HOSPITAL_COMMUNITY): Payer: Medicare Other

## 2020-01-12 ENCOUNTER — Encounter (HOSPITAL_COMMUNITY)
Admission: EM | Disposition: A | Discharge status: Skilled Nursing Facility | Payer: Self-pay | Source: Home / Self Care | Attending: Family Medicine

## 2020-01-12 DIAGNOSIS — R55 Syncope and collapse: Secondary | ICD-10-CM | POA: Diagnosis not present

## 2020-01-12 DIAGNOSIS — R0789 Other chest pain: Secondary | ICD-10-CM | POA: Diagnosis not present

## 2020-01-12 DIAGNOSIS — R778 Other specified abnormalities of plasma proteins: Secondary | ICD-10-CM

## 2020-01-12 DIAGNOSIS — Z89611 Acquired absence of right leg above knee: Secondary | ICD-10-CM

## 2020-01-12 DIAGNOSIS — I10 Essential (primary) hypertension: Secondary | ICD-10-CM | POA: Diagnosis not present

## 2020-01-12 HISTORY — PX: I & D EXTREMITY: SHX5045

## 2020-01-12 LAB — CBC
HCT: 27 % — ABNORMAL LOW (ref 39.0–52.0)
Hemoglobin: 8 g/dL — ABNORMAL LOW (ref 13.0–17.0)
MCH: 23.8 pg — ABNORMAL LOW (ref 26.0–34.0)
MCHC: 29.6 g/dL — ABNORMAL LOW (ref 30.0–36.0)
MCV: 80.4 fL (ref 80.0–100.0)
Platelets: 508 10*3/uL — ABNORMAL HIGH (ref 150–400)
RBC: 3.36 MIL/uL — ABNORMAL LOW (ref 4.22–5.81)
RDW: 17.4 % — ABNORMAL HIGH (ref 11.5–15.5)
WBC: 19.1 10*3/uL — ABNORMAL HIGH (ref 4.0–10.5)
nRBC: 0 % (ref 0.0–0.2)

## 2020-01-12 LAB — COMPREHENSIVE METABOLIC PANEL
ALT: 16 U/L (ref 0–44)
AST: 16 U/L (ref 15–41)
Albumin: 2.5 g/dL — ABNORMAL LOW (ref 3.5–5.0)
Alkaline Phosphatase: 84 U/L (ref 38–126)
Anion gap: 10 (ref 5–15)
BUN: 15 mg/dL (ref 6–20)
CO2: 23 mmol/L (ref 22–32)
Calcium: 8.9 mg/dL (ref 8.9–10.3)
Chloride: 107 mmol/L (ref 98–111)
Creatinine, Ser: 0.84 mg/dL (ref 0.61–1.24)
GFR, Estimated: >60 mL/min (ref >60)
Glucose, Bld: 87 mg/dL (ref 70–99)
Potassium: 3.6 mmol/L (ref 3.5–5.1)
Sodium: 140 mmol/L (ref 135–145)
Total Bilirubin: 1.2 mg/dL (ref 0.3–1.2)
Total Protein: 7.5 g/dL (ref 6.5–8.1)

## 2020-01-12 LAB — RETICULOCYTES
Immature Retic Fract: 28.3 % — ABNORMAL HIGH (ref 2.3–15.9)
RBC.: 3.32 MIL/uL — ABNORMAL LOW (ref 4.22–5.81)
Retic Count, Absolute: 66.4 10*3/uL (ref 19.0–186.0)
Retic Ct Pct: 2 % (ref 0.4–3.1)

## 2020-01-12 LAB — CBG MONITORING, ED
Glucose-Capillary: 74 mg/dL (ref 70–99)
Glucose-Capillary: 79 mg/dL (ref 70–99)

## 2020-01-12 LAB — CBC WITH DIFFERENTIAL/PLATELET
Abs Immature Granulocytes: 0.06 10*3/uL (ref 0.00–0.07)
Basophils Absolute: 0 10*3/uL (ref 0.0–0.1)
Basophils Relative: 0 %
Eosinophils Absolute: 0.2 10*3/uL (ref 0.0–0.5)
Eosinophils Relative: 3 %
HCT: 26.7 % — ABNORMAL LOW (ref 39.0–52.0)
Hemoglobin: 8 g/dL — ABNORMAL LOW (ref 13.0–17.0)
Immature Granulocytes: 1 %
Lymphocytes Relative: 12 %
Lymphs Abs: 1.1 10*3/uL (ref 0.7–4.0)
MCH: 23.9 pg — ABNORMAL LOW (ref 26.0–34.0)
MCHC: 30 g/dL (ref 30.0–36.0)
MCV: 79.7 fL — ABNORMAL LOW (ref 80.0–100.0)
Monocytes Absolute: 0.8 10*3/uL (ref 0.1–1.0)
Monocytes Relative: 9 %
Neutro Abs: 7.4 10*3/uL (ref 1.7–7.7)
Neutrophils Relative %: 75 %
Platelets: 493 10*3/uL — ABNORMAL HIGH (ref 150–400)
RBC: 3.35 MIL/uL — ABNORMAL LOW (ref 4.22–5.81)
RDW: 17.3 % — ABNORMAL HIGH (ref 11.5–15.5)
WBC: 9.7 10*3/uL (ref 4.0–10.5)
nRBC: 0 % (ref 0.0–0.2)

## 2020-01-12 LAB — TROPONIN I (HIGH SENSITIVITY)
Troponin I (High Sensitivity): 68 ng/L — ABNORMAL HIGH (ref <18)
Troponin I (High Sensitivity): 79 ng/L — ABNORMAL HIGH (ref <18)

## 2020-01-12 LAB — PHOSPHORUS: Phosphorus: 3.8 mg/dL (ref 2.5–4.6)

## 2020-01-12 LAB — IRON AND TIBC
Iron: 17 ug/dL — ABNORMAL LOW (ref 45–182)
Saturation Ratios: 7 % — ABNORMAL LOW (ref 17.9–39.5)
TIBC: 242 ug/dL — ABNORMAL LOW (ref 250–450)
UIBC: 225 ug/dL

## 2020-01-12 LAB — VITAMIN B12: Vitamin B-12: 238 pg/mL (ref 180–914)

## 2020-01-12 LAB — PROTIME-INR
INR: 1.2 (ref 0.8–1.2)
Prothrombin Time: 15.1 seconds (ref 11.4–15.2)

## 2020-01-12 LAB — CK: Total CK: 33 U/L — ABNORMAL LOW (ref 49–397)

## 2020-01-12 LAB — HIV ANTIBODY (ROUTINE TESTING W REFLEX): HIV Screen 4th Generation wRfx: NONREACTIVE

## 2020-01-12 LAB — CREATININE, SERUM
Creatinine, Ser: 1.05 mg/dL (ref 0.61–1.24)
GFR, Estimated: >60 mL/min (ref >60)

## 2020-01-12 LAB — GLUCOSE, CAPILLARY
Glucose-Capillary: 81 mg/dL (ref 70–99)
Glucose-Capillary: 190 mg/dL — ABNORMAL HIGH (ref 70–99)

## 2020-01-12 LAB — D-DIMER, QUANTITATIVE: D-Dimer, Quant: 9.72 ug/mL-FEU — ABNORMAL HIGH (ref 0.00–0.50)

## 2020-01-12 LAB — TSH: TSH: 2.889 u[IU]/mL (ref 0.350–4.500)

## 2020-01-12 LAB — FOLATE: Folate: 15.9 ng/mL (ref >5.9)

## 2020-01-12 LAB — MAGNESIUM: Magnesium: 1.9 mg/dL (ref 1.7–2.4)

## 2020-01-12 LAB — FERRITIN: Ferritin: 107 ng/mL (ref 24–336)

## 2020-01-12 LAB — LACTIC ACID, PLASMA: Lactic Acid, Venous: 0.6 mmol/L (ref 0.5–1.9)

## 2020-01-12 LAB — SURGICAL PCR SCREEN
MRSA, PCR: NEGATIVE
Staphylococcus aureus: NEGATIVE

## 2020-01-12 SURGERY — IRRIGATION AND DEBRIDEMENT EXTREMITY
Anesthesia: General | Laterality: Left

## 2020-01-12 MED ORDER — METHOCARBAMOL 1000 MG/10ML IJ SOLN
1000.0000 mg | Freq: Three times a day (TID) | INTRAVENOUS | Status: DC
  Filled 2020-01-12: qty 10

## 2020-01-12 MED ORDER — VANCOMYCIN HCL 1000 MG IV SOLR
INTRAVENOUS | Status: DC | PRN
  Administered 2020-01-12 (×2): 1000 mg

## 2020-01-12 MED ORDER — TOBRAMYCIN SULFATE 1.2 G IJ SOLR
INTRAMUSCULAR | Status: DC | PRN
  Administered 2020-01-12 (×2): 1.2 g

## 2020-01-12 MED ORDER — OXYCODONE HCL 5 MG PO TABS
10.0000 mg | ORAL_TABLET | ORAL | Status: DC | PRN
  Administered 2020-01-12 – 2020-01-13 (×3): 15 mg via ORAL
  Administered 2020-01-15: 10 mg via ORAL
  Administered 2020-01-17 – 2020-01-18 (×3): 15 mg via ORAL
  Administered 2020-01-19: 10 mg via ORAL
  Filled 2020-01-12 (×5): qty 3
  Filled 2020-01-12: qty 2
  Filled 2020-01-12 (×3): qty 3

## 2020-01-12 MED ORDER — MIDAZOLAM HCL 2 MG/2ML IJ SOLN
2.0000 mg | Freq: Once | INTRAMUSCULAR | Status: AC
  Administered 2020-01-12: 2 mg via INTRAVENOUS

## 2020-01-12 MED ORDER — ENOXAPARIN SODIUM 40 MG/0.4ML ~~LOC~~ SOLN
40.0000 mg | SUBCUTANEOUS | Status: DC
  Administered 2020-01-13: 40 mg via SUBCUTANEOUS
  Filled 2020-01-12: qty 0.4

## 2020-01-12 MED ORDER — MIDAZOLAM HCL 2 MG/2ML IJ SOLN
INTRAMUSCULAR | Status: AC
  Filled 2020-01-12: qty 2

## 2020-01-12 MED ORDER — CHLORHEXIDINE GLUCONATE 4 % EX LIQD: 60.0000 mL | Freq: Once | CUTANEOUS | Status: DC

## 2020-01-12 MED ORDER — DOCUSATE SODIUM 100 MG PO CAPS
100.0000 mg | ORAL_CAPSULE | Freq: Two times a day (BID) | ORAL | Status: DC
  Administered 2020-01-12 – 2020-01-14 (×3): 100 mg via ORAL
  Filled 2020-01-12 (×11): qty 1

## 2020-01-12 MED ORDER — CEFAZOLIN SODIUM-DEXTROSE 2-3 GM-%(50ML) IV SOLR
INTRAVENOUS | Status: DC | PRN
  Administered 2020-01-12: 2 g via INTRAVENOUS

## 2020-01-12 MED ORDER — ORAL CARE MOUTH RINSE: 15.0000 mL | Freq: Once | OROMUCOSAL | Status: AC

## 2020-01-12 MED ORDER — DEXTROSE 5 % IV SOLN
2.0000 g | Freq: Once | INTRAVENOUS | Status: DC
  Filled 2020-01-12: qty 20

## 2020-01-12 MED ORDER — VANCOMYCIN HCL 1000 MG IV SOLR
INTRAVENOUS | Status: AC
  Filled 2020-01-12: qty 2000

## 2020-01-12 MED ORDER — SODIUM CHLORIDE 0.9 % IV SOLN
2.0000 g | INTRAVENOUS | Status: DC
  Administered 2020-01-12: 2 g via INTRAVENOUS
  Filled 2020-01-12: qty 2
  Filled 2020-01-12: qty 20

## 2020-01-12 MED ORDER — METHOCARBAMOL 500 MG PO TABS
1000.0000 mg | ORAL_TABLET | Freq: Three times a day (TID) | ORAL | Status: DC
  Administered 2020-01-12 – 2020-02-01 (×59): 1000 mg via ORAL
  Filled 2020-01-12 (×60): qty 2

## 2020-01-12 MED ORDER — PROPOFOL 10 MG/ML IV BOLUS
INTRAVENOUS | Status: DC | PRN
  Administered 2020-01-12: 170 mg via INTRAVENOUS

## 2020-01-12 MED ORDER — DEXAMETHASONE SODIUM PHOSPHATE 10 MG/ML IJ SOLN
INTRAMUSCULAR | Status: DC | PRN
  Administered 2020-01-12: 10 mg via INTRAVENOUS

## 2020-01-12 MED ORDER — SODIUM CHLORIDE 0.9 % IV SOLN
75.0000 mL/h | INTRAVENOUS | Status: DC
  Administered 2020-01-12: 75 mL/h via INTRAVENOUS

## 2020-01-12 MED ORDER — ONDANSETRON HCL 4 MG/2ML IJ SOLN
INTRAMUSCULAR | Status: DC | PRN
  Administered 2020-01-12: 4 mg via INTRAVENOUS

## 2020-01-12 MED ORDER — HYDROMORPHONE HCL 1 MG/ML IJ SOLN
1.0000 mg | INTRAMUSCULAR | Status: DC | PRN
  Administered 2020-01-12 – 2020-01-19 (×36): 1 mg via INTRAVENOUS
  Filled 2020-01-12 (×36): qty 1

## 2020-01-12 MED ORDER — OXYCODONE HCL 5 MG PO TABS
5.0000 mg | ORAL_TABLET | ORAL | Status: DC | PRN
  Administered 2020-01-12: 10 mg via ORAL
  Filled 2020-01-12 (×2): qty 2

## 2020-01-12 MED ORDER — GABAPENTIN 300 MG PO CAPS
300.0000 mg | ORAL_CAPSULE | Freq: Three times a day (TID) | ORAL | Status: DC
  Administered 2020-01-12 – 2020-02-01 (×60): 300 mg via ORAL
  Filled 2020-01-12 (×60): qty 1

## 2020-01-12 MED ORDER — ACETAMINOPHEN 500 MG PO TABS
1000.0000 mg | ORAL_TABLET | Freq: Four times a day (QID) | ORAL | Status: DC
  Administered 2020-01-12 – 2020-02-01 (×70): 1000 mg via ORAL
  Filled 2020-01-12 (×74): qty 2

## 2020-01-12 MED ORDER — ONDANSETRON HCL 4 MG/2ML IJ SOLN: 4.0000 mg | Freq: Once | INTRAMUSCULAR | Status: DC | PRN

## 2020-01-12 MED ORDER — KETAMINE HCL 50 MG/5ML IJ SOSY
PREFILLED_SYRINGE | INTRAMUSCULAR | Status: AC
  Filled 2020-01-12: qty 5

## 2020-01-12 MED ORDER — MIDAZOLAM HCL 5 MG/5ML IJ SOLN
INTRAMUSCULAR | Status: DC | PRN
  Administered 2020-01-12: 2 mg via INTRAVENOUS

## 2020-01-12 MED ORDER — FENTANYL CITRATE (PF) 100 MCG/2ML IJ SOLN: 25.0000 ug | INTRAMUSCULAR | Status: DC | PRN

## 2020-01-12 MED ORDER — POTASSIUM CHLORIDE IN NACL 20-0.9 MEQ/L-% IV SOLN
INTRAVENOUS | Status: DC
  Filled 2020-01-12 (×3): qty 1000

## 2020-01-12 MED ORDER — LIDOCAINE HCL (CARDIAC) PF 100 MG/5ML IV SOSY
PREFILLED_SYRINGE | INTRAVENOUS | Status: DC | PRN
  Administered 2020-01-12: 60 mg via INTRAVENOUS

## 2020-01-12 MED ORDER — CHLORHEXIDINE GLUCONATE 0.12 % MT SOLN
OROMUCOSAL | Status: AC
  Administered 2020-01-12: 15 mL via OROMUCOSAL
  Filled 2020-01-12: qty 15

## 2020-01-12 MED ORDER — ACETAMINOPHEN 10 MG/ML IV SOLN: 1000.0000 mg | Freq: Once | INTRAVENOUS | Status: DC | PRN

## 2020-01-12 MED ORDER — PROPOFOL 10 MG/ML IV BOLUS
INTRAVENOUS | Status: AC
  Filled 2020-01-12: qty 40

## 2020-01-12 MED ORDER — VANCOMYCIN HCL IN DEXTROSE 1-5 GM/200ML-% IV SOLN
1000.0000 mg | Freq: Two times a day (BID) | INTRAVENOUS | Status: DC
  Administered 2020-01-13: 1000 mg via INTRAVENOUS
  Filled 2020-01-12: qty 200

## 2020-01-12 MED ORDER — DIPHENHYDRAMINE HCL 50 MG/ML IJ SOLN
INTRAMUSCULAR | Status: DC | PRN
  Administered 2020-01-12: 25 mg via INTRAVENOUS

## 2020-01-12 MED ORDER — ONDANSETRON HCL 4 MG/2ML IJ SOLN
4.0000 mg | Freq: Four times a day (QID) | INTRAMUSCULAR | Status: DC | PRN
  Administered 2020-01-12 – 2020-01-26 (×11): 4 mg via INTRAVENOUS
  Filled 2020-01-12 (×11): qty 2

## 2020-01-12 MED ORDER — HYDROMORPHONE HCL 1 MG/ML IJ SOLN
0.5000 mg | INTRAMUSCULAR | Status: DC | PRN
  Administered 2020-01-12 (×3): 1 mg via INTRAVENOUS
  Filled 2020-01-12 (×3): qty 1

## 2020-01-12 MED ORDER — POLYETHYLENE GLYCOL 3350 17 G PO PACK: 17.0000 g | PACK | Freq: Every day | ORAL | Status: DC | PRN

## 2020-01-12 MED ORDER — FENTANYL CITRATE (PF) 250 MCG/5ML IJ SOLN
INTRAMUSCULAR | Status: AC
  Filled 2020-01-12: qty 5

## 2020-01-12 MED ORDER — TOBRAMYCIN SULFATE 1.2 G IJ SOLR
INTRAMUSCULAR | Status: AC
  Filled 2020-01-12: qty 2.4

## 2020-01-12 MED ORDER — ONDANSETRON HCL 4 MG PO TABS: 4.0000 mg | ORAL_TABLET | Freq: Four times a day (QID) | ORAL | Status: DC | PRN

## 2020-01-12 MED ORDER — 0.9 % SODIUM CHLORIDE (POUR BTL) OPTIME
TOPICAL | Status: DC | PRN
  Administered 2020-01-12: 1000 mL

## 2020-01-12 MED ORDER — FENTANYL CITRATE (PF) 250 MCG/5ML IJ SOLN
INTRAMUSCULAR | Status: DC | PRN
Start: 2020-01-12 — End: 2020-01-12
  Administered 2020-01-12 (×4): 50 ug via INTRAVENOUS

## 2020-01-12 MED ORDER — LACTATED RINGERS IV SOLN: INTRAVENOUS | Status: DC

## 2020-01-12 MED ORDER — CHLORHEXIDINE GLUCONATE 0.12 % MT SOLN: 15.0000 mL | Freq: Once | OROMUCOSAL | Status: AC

## 2020-01-12 MED ORDER — KETAMINE HCL 50 MG/ML IJ SOLN
INTRAMUSCULAR | Status: DC | PRN
  Administered 2020-01-12 (×3): 10 mg via INTRAMUSCULAR

## 2020-01-12 MED ORDER — FENTANYL CITRATE (PF) 100 MCG/2ML IJ SOLN
INTRAMUSCULAR | Status: AC
  Administered 2020-01-12: 25 ug via INTRAVENOUS
  Filled 2020-01-12: qty 2

## 2020-01-12 MED ORDER — ACETAMINOPHEN 10 MG/ML IV SOLN
INTRAVENOUS | Status: AC
  Administered 2020-01-12: 1000 mg via INTRAVENOUS
  Filled 2020-01-12: qty 100

## 2020-01-12 MED ORDER — ACETAMINOPHEN 325 MG PO TABS: 650.0000 mg | ORAL_TABLET | Freq: Four times a day (QID) | ORAL | Status: DC | PRN

## 2020-01-12 MED ORDER — ACETAMINOPHEN 650 MG RE SUPP: 650.0000 mg | Freq: Four times a day (QID) | RECTAL | Status: DC | PRN

## 2020-01-12 SURGICAL SUPPLY — 50 items
BNDG COHESIVE 4X5 TAN STRL (GAUZE/BANDAGES/DRESSINGS) ×3 IMPLANT
BNDG GAUZE ELAST 4 BULKY (GAUZE/BANDAGES/DRESSINGS) ×6 IMPLANT
BRUSH SCRUB EZ PLAIN DRY (MISCELLANEOUS) ×6 IMPLANT
CEMENT BONE REFOBACIN R1X40 US (Cement) ×2 IMPLANT
CHLORAPREP W/TINT 26 (MISCELLANEOUS) ×3 IMPLANT
COVER MAYO STAND STRL (DRAPES) ×3 IMPLANT
COVER SURGICAL LIGHT HANDLE (MISCELLANEOUS) ×6 IMPLANT
COVER WAND RF STERILE (DRAPES) ×3 IMPLANT
DRAPE ORTHO SPLIT 77X108 STRL (DRAPES) ×2
DRAPE SURG 17X23 STRL (DRAPES) ×3 IMPLANT
DRAPE SURG ORHT 6 SPLT 77X108 (DRAPES) ×1 IMPLANT
DRAPE U-SHAPE 47X51 STRL (DRAPES) ×3 IMPLANT
DRESSING PREVENA PLUS CUSTOM (GAUZE/BANDAGES/DRESSINGS) IMPLANT
DRSG ADAPTIC 3X8 NADH LF (GAUZE/BANDAGES/DRESSINGS) ×3 IMPLANT
DRSG PREVENA PLUS CUSTOM (GAUZE/BANDAGES/DRESSINGS) ×3
ELECT REM PT RETURN 9FT ADLT (ELECTROSURGICAL) ×3
ELECTRODE REM PT RTRN 9FT ADLT (ELECTROSURGICAL) IMPLANT
GAUZE SPONGE 4X4 12PLY STRL (GAUZE/BANDAGES/DRESSINGS) ×3 IMPLANT
GLOVE BIO SURGEON STRL SZ 6.5 (GLOVE) ×6 IMPLANT
GLOVE BIO SURGEON STRL SZ7.5 (GLOVE) ×12 IMPLANT
GLOVE BIO SURGEONS STRL SZ 6.5 (GLOVE) ×3
GLOVE BIOGEL PI IND STRL 6.5 (GLOVE) ×1 IMPLANT
GLOVE BIOGEL PI IND STRL 7.5 (GLOVE) ×1 IMPLANT
GLOVE BIOGEL PI INDICATOR 6.5 (GLOVE) ×2
GLOVE BIOGEL PI INDICATOR 7.5 (GLOVE) ×2
GOWN STRL REUS W/ TWL LRG LVL3 (GOWN DISPOSABLE) ×2 IMPLANT
GOWN STRL REUS W/TWL LRG LVL3 (GOWN DISPOSABLE) ×4
HANDPIECE INTERPULSE COAX TIP (DISPOSABLE) ×2
KIT BASIN OR (CUSTOM PROCEDURE TRAY) ×3 IMPLANT
KIT TURNOVER KIT B (KITS) ×3 IMPLANT
MANIFOLD NEPTUNE II (INSTRUMENTS) ×3 IMPLANT
NS IRRIG 1000ML POUR BTL (IV SOLUTION) ×3 IMPLANT
PACK ORTHO EXTREMITY (CUSTOM PROCEDURE TRAY) ×3 IMPLANT
PAD ARMBOARD 7.5X6 YLW CONV (MISCELLANEOUS) ×6 IMPLANT
PADDING CAST COTTON 6X4 STRL (CAST SUPPLIES) ×3 IMPLANT
SET HNDPC FAN SPRY TIP SCT (DISPOSABLE) IMPLANT
SPONGE LAP 18X18 RF (DISPOSABLE) ×3 IMPLANT
SUT ETHILON 2 0 FS 18 (SUTURE) ×4 IMPLANT
SUT ETHILON 2 0 PSLX (SUTURE) ×4 IMPLANT
SUT MON AB 2-0 CT1 36 (SUTURE) ×3 IMPLANT
SWAB CULTURE ESWAB REG 1ML (MISCELLANEOUS) IMPLANT
SYR 3ML CEMENT MIXER (MISCELLANEOUS) ×3
SYRINGE 3ML CEMENT MIXER (MISCELLANEOUS) IMPLANT
TOWEL GREEN STERILE (TOWEL DISPOSABLE) ×6 IMPLANT
TOWEL GREEN STERILE FF (TOWEL DISPOSABLE) ×3 IMPLANT
TUBE CONNECTING 12'X1/4 (SUCTIONS) ×1
TUBE CONNECTING 12X1/4 (SUCTIONS) ×2 IMPLANT
UNDERPAD 30X36 HEAVY ABSORB (UNDERPADS AND DIAPERS) ×3 IMPLANT
WATER STERILE IRR 1000ML POUR (IV SOLUTION) ×3 IMPLANT
YANKAUER SUCT BULB TIP NO VENT (SUCTIONS) ×3 IMPLANT

## 2020-01-12 NOTE — Consult Note (Signed)
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 m), weight 81.7 kg, SpO2 98 %. Physical Exam Constitutional:      General: He is not in acute distress.    Appearance: He is well-developed. He is not diaphoretic.  HENT:     Head: Normocephalic and atraumatic.  Eyes:     General: No scleral icterus.       Right eye: No discharge.        Left eye: No discharge.     Conjunctiva/sclera: Conjunctivae normal.  Cardiovascular:     Rate and Rhythm: Normal rate and regular rhythm.  Pulmonary:     Effort: Pulmonary effort is normal. No respiratory distress.  Musculoskeletal:      Cervical back: Normal range of motion.     Comments: LLE No traumatic wounds, ecchymosis, or rash  Knee severe TTP, purulent discharge from superomedial wound  No ankle effusion, large  knee effusion  Sens DPN, SPN, TN paresthetic  Motor EHL, ext, flex, evers 5/5  DP 2+, PT 2+, No significant edema  Skin:    General: Skin is warm and dry.  Neurological:     Mental Status: He is alert.  Psychiatric:        Behavior: Behavior normal.     Assessment/Plan: Left knee pain -- Plan I&D today with placement of antibiotic beads. Plan consultation by Dr. Sharol Given on Tuesday with plans for return to OR Wednesday for repeat I&D. No motion or WB restrictions necessary. Multiple medical problems including chronic pain, Munchausen syndrome, and s/p AKA RLE -- per primary service    Lisette Abu, PA-C Orthopedic Surgery [phone] 01/12/2020, 8:57 AM

## 2020-01-12 NOTE — Plan of Care (Signed)
  Problem: Clinical Measurements: Goal: Ability to maintain clinical measurements within normal limits will improve Outcome: Progressing   Problem: Clinical Measurements: Goal: Will remain free from infection Outcome: Progressing   

## 2020-01-12 NOTE — Anesthesia Postprocedure Evaluation (Signed)
Anesthesia Post Note  Patient: Jason [name]  Procedure(s) Performed: IRRIGATION AND DEBRIDEMENT EXTREMITY (Left )     Patient location during evaluation: PACU Anesthesia Type: General Level of consciousness: awake and alert Pain management: pain level controlled Vital Signs Assessment: post-procedure vital signs reviewed and stable Respiratory status: spontaneous breathing, nonlabored ventilation, respiratory function stable and patient connected to nasal cannula oxygen Cardiovascular status: blood pressure returned to baseline and stable Postop Assessment: no apparent nausea or vomiting Anesthetic complications: no   No complications documented.  Last Vitals:  Vitals:   01/12/20 1415 01/12/20 1437  BP: 137/86 133/90  Pulse: (!) 102 (!) 102  Resp: 13 15  Temp: (!) 36.4 C (!) 36.3 C  SpO2: 96%     Last Pain:  Vitals:   01/12/20 1437  TempSrc: Oral  PainSc:                  March Rummage Keziah Drotar

## 2020-01-12 NOTE — Consult Note (Signed)
[facility] Nurse Consult Note: Patient receiving care in [place].  Orthopedic PA at bedside evaluating left knee at the time of my entry into the room.  Orthopedic service will be managing this patient. Reason for Consult: left infected knee joint with drainage. Wound type: as above Pressure Injury POA: Yes/No/NA Measurement: NA Wound bed: Just above the knee there was a small draining hole Drainage (amount, consistency, odor) serosanginous on linens Periwound: intact Dressing procedure/placement/frequency: Place dry gauze over the draining hole, change as needed. Thank you for the consult.  [facility] nurse will not follow at this time.  Please re-consult the [place] team if needed.  Val Riles, RN, MSN, CWOCN, CNS-BC, pager [phone]

## 2020-01-12 NOTE — OR Nursing (Signed)
Pt is alert and oriented but drowsy awakens to verbal stimuli.Pt and/or family verbalized understanding of poc and discharge instructions. Reviewed admission and on going care with receiving RN. Pt is in NAD at this time and is ready to be transferred to floor. Will con't to monitor until pt is transferred. Pt on Monitor  Pt transported with RN and Tech Report given to [employer]
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
## 2020-01-12 NOTE — Anesthesia Procedure Notes (Signed)
Procedure Name: LMA Insertion Date/Time: 01/12/2020 12:00 PM Performed by: Mariea Clonts, CRNA Pre-anesthesia Checklist: Patient identified, Emergency Drugs available, Suction available and Patient being monitored Patient Re-evaluated:Patient Re-evaluated prior to induction Oxygen Delivery Method: Circle System Utilized Preoxygenation: Pre-oxygenation with 100% oxygen Induction Type: IV induction Ventilation: Mask ventilation without difficulty LMA: LMA inserted LMA Size: 5.0 Number of attempts: 1 Airway Equipment and Method: Bite block Placement Confirmation: positive ETCO2 Tube secured with: Tape Dental Injury: Teeth and Oropharynx as per pre-operative assessment

## 2020-01-12 NOTE — Progress Notes (Signed)
Report given to Janett Billow, Therapist, sports. Patient is alert and oriented x4 resting in bed with call light in reach. Bed alarm activated. TeleSitter in use. IVF infusing. Plan is for surgery and/or ortho to see and evaluate patient for possible surgical intervention. Last set of blood cultures and troponin due at 1030.  Hgb 8.0, platelets 493, K+ 3.6, Creat 0.84 and D dimer 9.72 this AM. May need venous duplex vs CTA to rule out DVT/PE.

## 2020-01-12 NOTE — H&P (View-Only) (Signed)
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 10"  (1.778 m), weight 81.7 kg, SpO2 98 %. Physical Exam Constitutional:      General: He is not in acute distress.    Appearance: He is well-developed. He is not diaphoretic.  HENT:     Head: Normocephalic and atraumatic.  Eyes:     General: No scleral icterus.       Right eye: No discharge.        Left eye: No discharge.     Conjunctiva/sclera: Conjunctivae normal.  Cardiovascular:     Rate and Rhythm: Normal rate and regular rhythm.  Pulmonary:     Effort: Pulmonary effort is normal. No respiratory distress.  Musculoskeletal:      Cervical back: Normal range of motion.     Comments: LLE No traumatic wounds, ecchymosis, or rash  Knee severe TTP, purulent discharge from superomedial wound  No ankle effusion, large  knee effusion  Sens DPN, SPN, TN paresthetic  Motor EHL, ext, flex, evers 5/5  DP 2+, PT 2+, No significant edema  Skin:    General: Skin is warm and dry.  Neurological:     Mental Status: He is alert.  Psychiatric:        Behavior: Behavior normal.     Assessment/Plan: Left knee pain -- Plan I&D today with placement of antibiotic beads. Plan consultation by Dr. Sharol Given on Tuesday with plans for return to OR Wednesday for repeat I&D. No motion or WB restrictions necessary. Multiple medical problems including chronic pain, Munchausen syndrome, and s/p AKA RLE -- per primary service    Lisette Abu, PA-C Orthopedic Surgery [phone] 01/12/2020, 8:57 AM

## 2020-01-12 NOTE — Anesthesia Preprocedure Evaluation (Addendum)
Anesthesia Evaluation  Patient identified by MRN, date of birth, ID band Patient awake    Reviewed: Patient's Chart, lab work & pertinent test results  History of Anesthesia Complications (+) PONV and POST - OP SPINAL HEADACHE  Airway Mallampati: II  TM Distance: >3 FB Neck ROM: Full    Dental  (+) Teeth Intact   Pulmonary neg pulmonary ROS,    Pulmonary exam normal        Cardiovascular hypertension,  Rhythm:Regular Rate:Normal     Neuro/Psych Anxiety Munchausen syndrome   GI/Hepatic negative GI ROS, Neg liver ROS,   Endo/Other  negative endocrine ROS  Renal/GU negative Renal ROS     Musculoskeletal  (+) Arthritis , Osteoarthritis,  Septic arthritis   Abdominal (+)  Abdomen: soft. Bowel sounds: normal.  Peds  Hematology   Anesthesia Other Findings   Reproductive/Obstetrics                             Anesthesia Physical Anesthesia Plan  ASA: II  Anesthesia Plan: General   Post-op Pain Management:    Induction:   PONV Risk Score and Plan: 3 and Ondansetron, Dexamethasone, Scopolamine patch - Pre-op and Treatment may vary due to age or medical condition  Airway Management Planned: Mask and LMA  Additional Equipment: None  Intra-op Plan:   Post-operative Plan: Extubation in OR  Informed Consent: I have reviewed the patients History and Physical, chart, labs and discussed the procedure including the risks, benefits and alternatives for the proposed anesthesia with the patient or authorized representative who has indicated his/her understanding and acceptance.     Dental advisory given  Plan Discussed with: CRNA  Anesthesia Plan Comments: (Lab Results      Component                Value               Date                      WBC                      9.7                 01/12/2020                HGB                      8.0 (L)             01/12/2020                HCT                       26.7 (L)            01/12/2020                MCV                      79.7 (L)            01/12/2020                PLT                      493 (H)  01/12/2020          )        Anesthesia Quick Evaluation

## 2020-01-12 NOTE — Progress Notes (Signed)
Pharmacy Antibiotic Note  Jason [name] is a 54 y.o. male admitted on 01/11/2020 with recurrent L knee septic arthritis. Pt s/p multiple previous I&Ds and AKA, now with I&D in OR today. Pharmacy has been consulted for vancomycin dosing. Cr is <1 and at baseline, CTX ordered by MD.  Plan: Vancomycin 1000mg  IV q12h Follow renal function, LOT, Cx   Height: 5\' 10"  (177.8 cm) Weight: 81.7 kg (180 lb 1.9 oz) IBW/kg (Calculated) : 73  Temp (24hrs), Avg:97.9 F (36.6 C), Min:96.9 F (36.1 C), Max:98.7 F (37.1 C)  Recent Labs  Lab 01/11/20 1816 01/12/20 0540  WBC 12.0* 9.7  CREATININE 1.09 0.84  LATICACIDVEN  --  0.6    Estimated Creatinine Clearance: 107.4 mL/min (by C-G formula based on SCr of 0.84 mg/dL).    Allergies  Allergen Reactions  . Iodine Rash    Reports localized reaction at IV site. Able to tolerate with Benadryl.   Samuel [place] Dye [Iodinated Diagnostic Agents] Anaphylaxis    Can be pre-treated with Benadryl  . Metrizamide Anaphylaxis  . Other Other (See Comments)    Under no circumstances will the patient agree to a PICC line  . Reglan [Metoclopramide] Anaphylaxis  . Shellfish-Derived Products Anaphylaxis  . Toradol [Ketorolac Tromethamine] Itching    Patient having systemic itching after receiving IV dose  . Lidocaine Other (See Comments) and Rash    Pt not sure if this is an actual allergy - might have been a one time incident  . Morphine Swelling and Rash    Local reaction to IV being pushed too fast  . Vancomycin Rash    Has had vancomycin since this reaction and had no reaction at all  . Codeine Hives  . Methadone Hives  . Propoxyphene Hives  . Amoxicillin Rash    Patient denies true reaction  . Penicillins Rash    Has patient had a PCN reaction causing immediate rash, facial/tongue/throat swelling, SOB or lightheadedness with hypotension: Yes Has patient had a PCN reaction causing severe rash involving mucus membranes or skin necrosis: No Has patient had  a PCN reaction that required hospitalization No Has patient had a PCN reaction occurring within the last 10 years: No If all of the above answers are "NO", then may proceed with Cephalosporin use.   . Tape Rash    Arrie Senate, PharmD, BCPS Clinical Pharmacist Please check AMION for all [place] numbers 01/12/2020

## 2020-01-12 NOTE — ED Notes (Signed)
Pt refusing blood draw. Explained need for blood work, states this has to be done with ultrasound. MD notified.

## 2020-01-12 NOTE — Procedures (Signed)
Patient does not want echo at this time due to pain.

## 2020-01-12 NOTE — Op Note (Signed)
Orthopaedic Surgery Operative Note (CSN: [id number] ) Date of Surgery: 01/12/2020  Admit Date: 01/11/2020   Diagnoses: Pre-Op Diagnoses: Left knee septic arthritis Left distal femur/proximal tibia osteomyelitis  Post-Op Diagnosis: Same  Procedures: 1. CPT 27310-Incision and drainage of left septic knee 2. CPT 27301-Incision and drainage of left anterior thigh abscess 3. CPT 11981-Placement of antibiotic cement  4. CPT 97605-Incisional wound vac placement  Surgeons : Primary: Shona Needles, MD  Assistant: Patrecia Pace, PA-C  Location: OR 3   Anesthesia:General  Antibiotics: Ancef 2g after cultures and 1 gm vancomycin powder and 1.2 gm tobramycin powder placed in antibiotic cement beads   Tourniquet time:None  Estimated Blood GDJM:42 mL  Complications:None  Specimens: ID Type Source Tests Collected by Time Destination  A : Left knee Puss Tissue PATH Other AEROBIC/ANAEROBIC CULTURE (SURGICAL/DEEP WOUND) Shona Needles, MD 01/12/2020 1231      Implants: Implant Name Type Inv. Item Serial No. Manufacturer Lot No. LRB No. Used Action  CEMENT BONE REFOBACIN [id number] [place] - [id number] Cement CEMENT BONE REFOBACIN [id number] [place]  [employer](ORTH,TRAU,BIO,SG) [id number] Left 1 Implanted     Indications for Surgery: 54 year old male who had previous history of septic knee arthritis.  He has undergone multiple debridements at [facility].  He was subsequently admitted here for syncope episode with physical therapy.  He was found to have significant swelling and pain in his left knee as well as x-rays and CT scan that showed recurrence of the effusion with purulence that started to drain from one of his drain sites.  Patient refused transfer to [facility].  As a result I was consulted.  I recommended proceeding with incision and drainage of left knee with placement of antibiotic cement beads and possible incisional wound VAC.  Risks and benefits were discussed with the  patient.  Risks include but not limited to bleeding, persistent infection, posttraumatic arthritis, knee stiffness, nerve and blood vessel injury, need for further surgeries, even the possibility of anesthetic complications.  The patient agreed to proceed with surgery and consent was obtained.  Operative Findings: 1.  Significant left knee septic arthritis and anterior thigh abscess treated with incision and drainage and debridement. 2.  Significant damage to the articular cartilage of the femoral condyle consistent with subacute/chronic septic arthritis 3.  Placement of antibiotic cement beads into the suprapatellar pouch and knee joint.  A total of 16 cement beads were placed 4.  Incisional wound VAC placement to left knee.  Procedure: The patient was identified in the preoperative holding area. Consent was confirmed with the patient and their family and all questions were answered. The operative extremity was marked after confirmation with the patient. he was then brought back to the operating room by our anesthesia colleagues.  He was a placed under general anesthetic and carefully transferred over to a regular OR table.  The left lower extremity was prepped and draped in usual sterile fashion.  A timeout was performed to verify the patient, the procedure, and the extremity.  Preoperative antibiotics were held until cultures were taken.  He had a curvilinear incision on the anterior aspect of his knee.  This was presumed to be from his previous I&D's.  I then reopened this up.  I incised through the capsule and encountered significant amount of purulence.  I took cultures and sent purulent fluid to the lab.  I extended my incision all the way down medial to the patellar tendon.  I then proceeded to thoroughly debride the  soft tissue in the anterior thigh as well as the knee joint.  I used a rongeur to debride.  I then used a low pressure pulsatile lavage to thoroughly irrigate the knee and the wound.   I used a total of 6 L.  There was some destruction of his articular cartilage of his femoral condyle.  This was presumed to be from his subacute infection.  I then changed our gloves and instruments and proceeded to mix cement with 1 g of vancomycin powder 1.2 g of tobramycin powder.  These were threaded on a #1 Prolene suture.  A total of 16 antibiotic beads were made.  These were placed into the suprapatellar pouch and underneath the quad muscle.  There are also placed in the knee joint.  I then approximated the skin using 2-0 nylon suture.  I then placed a Prevena incisional VAC over the left lower extremity.  A Ace wrap was used to provide compression.  The patient was then awoken from anesthesia and taken to the PACU in stable condition.  Post Op Plan/Instructions: Patient may be weightbearing as tolerated to left lower extremity.  We will place him on broad-spectrum antibiotics and obtain an ID consult.  He should be placed on Lovenox for DVT prophylaxis.  I was present and performed the entire surgery.  Patrecia Pace, PA-C did assist me throughout the case. An assistant was necessary given the difficulty in approach, maintenance of reduction and ability to instrument the fracture.   Katha Hamming, MD [facility]
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
## 2020-01-12 NOTE — Transfer of Care (Signed)
Immediate Anesthesia Transfer of Care Note  Patient: Jason [name]  Procedure(s) Performed: IRRIGATION AND DEBRIDEMENT EXTREMITY (Left )  Patient Location: PACU  Anesthesia Type:General  Level of Consciousness: awake, alert  and oriented  Airway & Oxygen Therapy: Patient Spontanous Breathing and Patient connected to nasal cannula oxygen  Post-op Assessment: Report given to RN, Post -op Vital signs reviewed and stable and Patient moving all extremities X 4  Post vital signs: Reviewed and stable  Last Vitals:  Vitals Value Taken Time  BP 149/98 01/12/20 1315  Temp 36.1 C 01/12/20 1315  Pulse 98 01/12/20 1323  Resp 7 01/12/20 1323  SpO2 100 % 01/12/20 1323  Vitals shown include unvalidated device data.  Last Pain:  Vitals:   01/12/20 1315  TempSrc:   PainSc: Asleep         Complications: No complications documented.

## 2020-01-12 NOTE — Interval H&P Note (Signed)
History and Physical Interval Note:  01/12/2020 11:07 AM  Jason [name]  has presented today for surgery, with the diagnosis of Left knee septic arthritis.  The various methods of treatment have been discussed with the patient and family. After consideration of risks, benefits and other options for treatment, the patient has consented to  Procedure(s): IRRIGATION AND DEBRIDEMENT EXTREMITY (Left) as a surgical intervention.  The patient's history has been reviewed, patient examined, no change in status, stable for surgery.  I have reviewed the patient's chart and labs.  Questions were answered to the patient's satisfaction.     Lennette Bihari P Geneva Barrero

## 2020-01-13 ENCOUNTER — Encounter (HOSPITAL_COMMUNITY): Payer: Self-pay | Admitting: Student

## 2020-01-13 ENCOUNTER — Inpatient Hospital Stay (HOSPITAL_COMMUNITY): Payer: Medicare Other

## 2020-01-13 DIAGNOSIS — I2699 Other pulmonary embolism without acute cor pulmonale: Secondary | ICD-10-CM

## 2020-01-13 DIAGNOSIS — K625 Hemorrhage of anus and rectum: Secondary | ICD-10-CM

## 2020-01-13 DIAGNOSIS — D500 Iron deficiency anemia secondary to blood loss (chronic): Secondary | ICD-10-CM | POA: Diagnosis not present

## 2020-01-13 DIAGNOSIS — R55 Syncope and collapse: Secondary | ICD-10-CM | POA: Diagnosis not present

## 2020-01-13 DIAGNOSIS — R079 Chest pain, unspecified: Secondary | ICD-10-CM

## 2020-01-13 DIAGNOSIS — R0789 Other chest pain: Secondary | ICD-10-CM | POA: Diagnosis not present

## 2020-01-13 LAB — PREPARE RBC (CROSSMATCH)

## 2020-01-13 LAB — BASIC METABOLIC PANEL
Anion gap: 9 (ref 5–15)
BUN: 15 mg/dL (ref 6–20)
CO2: 24 mmol/L (ref 22–32)
Calcium: 8.4 mg/dL — ABNORMAL LOW (ref 8.9–10.3)
Chloride: 105 mmol/L (ref 98–111)
Creatinine, Ser: 0.84 mg/dL (ref 0.61–1.24)
GFR, Estimated: >60 mL/min (ref >60)
Glucose, Bld: 207 mg/dL — ABNORMAL HIGH (ref 70–99)
Potassium: 4.9 mmol/L (ref 3.5–5.1)
Sodium: 138 mmol/L (ref 135–145)

## 2020-01-13 LAB — CBC
HCT: 23.3 % — ABNORMAL LOW (ref 39.0–52.0)
HCT: 21.9 % — ABNORMAL LOW (ref 39.0–52.0)
Hemoglobin: 7 g/dL — ABNORMAL LOW (ref 13.0–17.0)
Hemoglobin: 6.5 g/dL — CL (ref 13.0–17.0)
MCH: 23.6 pg — ABNORMAL LOW (ref 26.0–34.0)
MCH: 23.8 pg — ABNORMAL LOW (ref 26.0–34.0)
MCHC: 30 g/dL (ref 30.0–36.0)
MCHC: 29.7 g/dL — ABNORMAL LOW (ref 30.0–36.0)
MCV: 78.5 fL — ABNORMAL LOW (ref 80.0–100.0)
MCV: 80.2 fL (ref 80.0–100.0)
Platelets: 422 10*3/uL — ABNORMAL HIGH (ref 150–400)
Platelets: 295 10*3/uL (ref 150–400)
RBC: 2.97 MIL/uL — ABNORMAL LOW (ref 4.22–5.81)
RBC: 2.73 MIL/uL — ABNORMAL LOW (ref 4.22–5.81)
RDW: 17 % — ABNORMAL HIGH (ref 11.5–15.5)
RDW: 17.4 % — ABNORMAL HIGH (ref 11.5–15.5)
WBC: 11.2 10*3/uL — ABNORMAL HIGH (ref 4.0–10.5)
WBC: 9.4 10*3/uL (ref 4.0–10.5)
nRBC: 0 % (ref 0.0–0.2)
nRBC: 0 % (ref 0.0–0.2)

## 2020-01-13 LAB — ECHOCARDIOGRAM COMPLETE
AR max vel: 2.98 cm2
AV Area VTI: 2.58 cm2
AV Area mean vel: 2.95 cm2
AV Mean grad: 4 mmHg
AV Peak grad: 7.2 mmHg
Ao pk vel: 1.34 m/s
Area-P 1/2: 3.42 cm2
Height: 70 in
S' Lateral: 2.8 cm
Weight: 2881.85 oz

## 2020-01-13 LAB — ABO/RH: ABO/RH(D): O NEG

## 2020-01-13 LAB — GLUCOSE, CAPILLARY: Glucose-Capillary: 109 mg/dL — ABNORMAL HIGH (ref 70–99)

## 2020-01-13 MED ORDER — DIPHENHYDRAMINE HCL 25 MG PO CAPS: 50.0000 mg | ORAL_CAPSULE | Freq: Once | ORAL | Status: DC

## 2020-01-13 MED ORDER — ZINC OXIDE 11.3 % EX CREA
TOPICAL_CREAM | CUTANEOUS | Status: DC | PRN
  Filled 2020-01-13: qty 56

## 2020-01-13 MED ORDER — SODIUM CHLORIDE 0.9 % IV SOLN
3.0000 g | Freq: Four times a day (QID) | INTRAVENOUS | Status: DC
  Administered 2020-01-13 – 2020-02-01 (×73): 3 g via INTRAVENOUS
  Filled 2020-01-13: qty 3
  Filled 2020-01-13: qty 8
  Filled 2020-01-13 (×2): qty 3
  Filled 2020-01-13 (×3): qty 8
  Filled 2020-01-13: qty 3
  Filled 2020-01-13 (×3): qty 8
  Filled 2020-01-13: qty 3
  Filled 2020-01-13 (×2): qty 8
  Filled 2020-01-13: qty 3
  Filled 2020-01-13 (×2): qty 8
  Filled 2020-01-13 (×2): qty 3
  Filled 2020-01-13: qty 8
  Filled 2020-01-13 (×3): qty 3
  Filled 2020-01-13: qty 8
  Filled 2020-01-13: qty 3
  Filled 2020-01-13 (×3): qty 8
  Filled 2020-01-13: qty 3
  Filled 2020-01-13: qty 8
  Filled 2020-01-13 (×2): qty 3
  Filled 2020-01-13: qty 8
  Filled 2020-01-13 (×8): qty 3
  Filled 2020-01-13 (×3): qty 8
  Filled 2020-01-13: qty 3
  Filled 2020-01-13 (×2): qty 8
  Filled 2020-01-13 (×2): qty 3
  Filled 2020-01-13 (×3): qty 8
  Filled 2020-01-13 (×4): qty 3
  Filled 2020-01-13: qty 8
  Filled 2020-01-13 (×5): qty 3
  Filled 2020-01-13: qty 8
  Filled 2020-01-13 (×2): qty 3
  Filled 2020-01-13 (×2): qty 8
  Filled 2020-01-13 (×2): qty 3
  Filled 2020-01-13: qty 8
  Filled 2020-01-13 (×2): qty 3
  Filled 2020-01-13 (×2): qty 8
  Filled 2020-01-13: qty 3
  Filled 2020-01-13 (×3): qty 8
  Filled 2020-01-13 (×2): qty 3

## 2020-01-13 MED ORDER — HYDROCORTISONE NA SUCCINATE PF 250 MG IJ SOLR: 200.0000 mg | Freq: Once | INTRAMUSCULAR | Status: DC

## 2020-01-13 MED ORDER — DIPHENHYDRAMINE HCL 50 MG/ML IJ SOLN
50.0000 mg | Freq: Once | INTRAMUSCULAR | Status: AC
  Administered 2020-01-14: 50 mg via INTRAVENOUS
  Filled 2020-01-13: qty 1

## 2020-01-13 MED ORDER — HEPARIN (PORCINE) 25000 UT/250ML-% IV SOLN
1150.0000 [IU]/h | INTRAVENOUS | Status: DC
  Administered 2020-01-13 (×2): 1150 [IU]/h via INTRAVENOUS
  Filled 2020-01-13: qty 250

## 2020-01-13 MED ORDER — METRONIDAZOLE 500 MG PO TABS
500.0000 mg | ORAL_TABLET | Freq: Three times a day (TID) | ORAL | Status: DC
  Administered 2020-01-13: 500 mg via ORAL
  Filled 2020-01-13: qty 1

## 2020-01-13 MED ORDER — DIPHENHYDRAMINE HCL 25 MG PO CAPS: 50.0000 mg | ORAL_CAPSULE | Freq: Once | ORAL | Status: AC

## 2020-01-13 MED ORDER — SODIUM CHLORIDE 0.9 % IV SOLN
1.5000 g | Freq: Four times a day (QID) | INTRAVENOUS | Status: DC
  Filled 2020-01-13 (×3): qty 4

## 2020-01-13 MED ORDER — SODIUM CHLORIDE 0.9% IV SOLUTION: Freq: Once | INTRAVENOUS | Status: DC

## 2020-01-13 MED ORDER — SODIUM CHLORIDE 0.9 % IV SOLN
3.0000 g | Freq: Four times a day (QID) | INTRAVENOUS | Status: DC
  Filled 2020-01-13 (×2): qty 8

## 2020-01-13 MED ORDER — DIPHENHYDRAMINE HCL 50 MG/ML IJ SOLN: 50.0000 mg | Freq: Once | INTRAMUSCULAR | Status: DC

## 2020-01-13 MED ORDER — PREDNISONE 20 MG PO TABS
50.0000 mg | ORAL_TABLET | Freq: Four times a day (QID) | ORAL | Status: AC
  Administered 2020-01-13 – 2020-01-14 (×3): 50 mg via ORAL
  Filled 2020-01-13: qty 2
  Filled 2020-01-13: qty 1
  Filled 2020-01-13: qty 2

## 2020-01-13 NOTE — Progress Notes (Signed)
Lower Ext study completed.  ° °See CVProc for preliminary results.  ° °Braeley Buskey, RDMS, RVT ° °

## 2020-01-13 NOTE — Procedures (Signed)
Echo attempted. Patient on phone having conversation. Will attempt again later.
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
## 2020-01-13 NOTE — Progress Notes (Signed)
Orthopaedic Trauma Progress Note  S: Doing okay this morning, notes pain throughout left leg. Pain medications helping some but he does not want to increase dosages at this time. Cultures obtained intraoperatively still pending, no growth < 24 hours. Infectious disease team consulted postoperatively yesterday, plan to evaluate patient this morning. Patient has been very pleased with his care at this hospital thus far.  O:  Vitals:   01/13/20 0411 01/13/20 0758  BP:  130/81  Pulse:  100  Resp:  20  Temp:  98 F (36.7 C)  SpO2: 100% 99%    General: NAD. Sitting up in bed Respiratory:  No increased work of breathing.  Left lower extremity: Dressing CDI. Incisional wound vac with good seal and function, total of about 30 mL output currently. Ankle dorsiflexion/plantarflexion is intact. Tolerates minimal knee motion which appears to be baseline. Endorses sensation to light touch of dorsal and plantar aspect of foot. Otherwise neurovascularly intact.   Labs:  Results for orders placed or performed during the hospital encounter of 01/11/20 (from the past 24 hour(s))  Surgical pcr screen     Status: None   Collection Time: 01/12/20 10:31 AM   Specimen: Nasal Mucosa; Nasal Swab  Result Value Ref Range   MRSA, PCR NEGATIVE NEGATIVE   Staphylococcus aureus NEGATIVE NEGATIVE  Aerobic/Anaerobic Culture (surgical/deep wound)     Status: None (Preliminary result)   Collection Time: 01/12/20 12:31 PM   Specimen: PATH Other; Tissue  Result Value Ref Range   Specimen Description TISSUE    Special Requests LEFT KNEE PUSS SPEC A    Gram Stain PENDING    Culture      NO GROWTH < 24 HOURS Performed at [facility], [place] [address], [place], [place] [address]    Report Status PENDING   Troponin I (High Sensitivity)     Status: Abnormal   Collection Time: 01/12/20  3:04 PM  Result Value Ref Range   Troponin I (High Sensitivity) 79 (H) <18 ng/L  CBC     Status: Abnormal   Collection Time:  01/12/20  3:04 PM  Result Value Ref Range   WBC 19.1 (H) 4.0 - 10.5 K/uL   RBC 3.36 (L) 4.22 - 5.81 MIL/uL   Hemoglobin 8.0 (L) 13.0 - 17.0 g/dL   HCT 27.0 (L) 39 - 52 %   MCV 80.4 80.0 - 100.0 fL   MCH 23.8 (L) 26.0 - 34.0 pg   MCHC 29.6 (L) 30.0 - 36.0 g/dL   RDW 17.4 (H) 11.5 - 15.5 %   Platelets 508 (H) 150 - 400 K/uL   nRBC 0.0 0.0 - 0.2 %  Creatinine, serum     Status: None   Collection Time: 01/12/20  3:04 PM  Result Value Ref Range   Creatinine, Ser 1.05 0.61 - 1.24 mg/dL   GFR, Estimated >60 >60 mL/min  Glucose, capillary     Status: Abnormal   Collection Time: 01/12/20 10:19 PM  Result Value Ref Range   Glucose-Capillary 190 (H) 70 - 99 mg/dL  Basic metabolic panel     Status: Abnormal   Collection Time: 01/13/20  3:08 AM  Result Value Ref Range   Sodium 138 135 - 145 mmol/L   Potassium 4.9 3.5 - 5.1 mmol/L   Chloride 105 98 - 111 mmol/L   CO2 24 22 - 32 mmol/L   Glucose, Bld 207 (H) 70 - 99 mg/dL   BUN 15 6 - 20 mg/dL   Creatinine, Ser  0.84 0.61 - 1.24 mg/dL   Calcium 8.4 (L) 8.9 - 10.3 mg/dL   GFR, Estimated >60 >60 mL/min   Anion gap 9 5 - 15  CBC     Status: Abnormal   Collection Time: 01/13/20  3:08 AM  Result Value Ref Range   WBC 11.2 (H) 4.0 - 10.5 K/uL   RBC 2.97 (L) 4.22 - 5.81 MIL/uL   Hemoglobin 7.0 (L) 13.0 - 17.0 g/dL   HCT 23.3 (L) 39 - 52 %   MCV 78.5 (L) 80.0 - 100.0 fL   MCH 23.6 (L) 26.0 - 34.0 pg   MCHC 30.0 30.0 - 36.0 g/dL   RDW 17.0 (H) 11.5 - 15.5 %   Platelets 422 (H) 150 - 400 K/uL   nRBC 0.0 0.0 - 0.2 %    Assessment: 54 year old male with left septic knee arthritis and left distal femur/proximal tibia osteomyelitis s/p I&D with placement of antibiotic cement beads and incisional wound VAC, 1 Day Post-Op    Weightbearing: WBAT LLE  Insicional and dressing care: Leave incisional wound VAC in place  Orthopedic device(s): Wound Vac:LLE   CV/Blood loss: Acute blood loss anemia, Hgb 7.0 this morning. Hemodynamically stable  Pain  management:  1. Tylenol 1000 mg q 6 hours scheduled 2. Robaxin 1000 mg q 8 hours 3. Oxycodone 5-15 mg q 4 hours PRN 4. Neurontin 300 mg TID 5. Dilaudid 1 mg q 3 hours PRN  VTE prophylaxis: Hold Lovenox until hemoglobin stabilizes   ID: Vancomycin + ceftriaxone currently, awaiting culture results  Foley/Lines: No foley, KVO IVFs  Medical co-morbidities:  S/P right AKA, hx of septic arthritis of left knee, h/o opioid dependence,documented Munchausen syndrome   Dispo: Therapies as tolerated. ID to consult today, appreciate assistance. Plan consultation by Dr. Sharol Given on Tuesday with plans for return to OR Wednesday for repeat I&D.  Follow - up plan: To be determined  Contact information:  Katha Hamming MD, Patrecia Pace PA-C   Ritika Hellickson [facility] [phone] (office) [email or web address]

## 2020-01-13 NOTE — Progress Notes (Signed)
ID Pharmacy Progress Note  Allergy Assessment: Penicillins  Jason [name] is a 11YM who presents to [facility] with left femur/tibia osteomyelitis and septic arthritis. He has a recent history of various mouth organisms (I.e. prevotella and strep spp.) found in his knee likely due to self-inflicted inoculation.   Currently, allergies to amoxicillin and penicillins are listed in his chart due to possible rash. Patient received amoxicillin and Augmentin at [facility] during recent admission in early October and tolerated both well.   ID is following patient here at [facility] and is starting him on Unasyn. Will de-label allergy given evidence of tolerance recently.  Thank you for kindly for allowing pharmacy to be a part of this patient's care,  Alfonse Spruce, PharmD PGY2 ID Pharmacy Resident [phone]

## 2020-01-13 NOTE — Progress Notes (Signed)
IV team consult placed at 1604, needing another IV access due to patient receiving IV fluids and abx ordered along with a blood transfusion.  Patient explained to this nurse that the IV team came up to his room and then explained to him that they needed an ultrasound to place his IV and would be back to do so. Heparin IV ordered also but when attempted to start Heparin, the current IV he had no longer was working.  Sent IV team another consult to have 2 IV's placed due to needing, IV fluids, blood and now Heparin.  Report given to [facility] and explained that at [place] the patient still does not have IV access.

## 2020-01-13 NOTE — Progress Notes (Signed)
  Echocardiogram 2D Echocardiogram has been performed.  Jason [name] 01/13/2020, 11:54 AM

## 2020-01-13 NOTE — Progress Notes (Signed)
ANTICOAGULATION CONSULT NOTE - Follow Up Consult  Pharmacy Consult for Heparin Indication: pulmonary embolus, rule out  Allergies  Allergen Reactions  . Ivp Dye [Iodinated Diagnostic Agents] Anaphylaxis    Can be pre-treated with Benadryl  . Metrizamide Anaphylaxis  . Other Other (See Comments)    Under no circumstances will the patient agree to a PICC line  . Reglan [Metoclopramide] Anaphylaxis  . Shellfish-Derived Products Anaphylaxis  . Codeine Hives  . Lidocaine Other (See Comments) and Rash    Pt not sure if this is an actual allergy - might have been a one time incident  . Methadone Hives  . Morphine Swelling and Rash    Local reaction to IV being pushed too fast  . Propoxyphene Hives  . Toradol [Ketorolac Tromethamine] Itching    Patient having systemic itching after receiving IV dose  . Iodine Rash    Reports localized reaction at IV site. Able to tolerate with Benadryl.   . Tape Rash  . Vancomycin Rash    Has had vancomycin since this reaction and had no reaction at all    Patient Measurements: Height: 5\' 10"  (177.8 cm) Weight: 81.7 kg (180 lb 1.9 oz) IBW/kg (Calculated) : 73 Heparin Dosing Weight: 81.7 kg  Vital Signs: Temp: 98.1 F (36.7 C) (10/29 1300) Temp Source: Oral (10/29 1300) BP: 124/72 (10/29 1300) Pulse Rate: 103 (10/29 1300)  Labs: Recent Labs    01/11/20 1816 01/11/20 2014 01/12/20 0540 01/12/20 0540 01/12/20 1504 01/13/20 0308  HGB   < >  --  8.0*   < > 8.0* 7.0*  HCT   < >  --  26.7*  --  27.0* 23.3*  PLT   < >  --  493*  --  508* 422*  LABPROT  --   --  15.1  --   --   --   INR  --   --  1.2  --   --   --   CREATININE   < >  --  0.84  --  1.05 0.84  CKTOTAL  --   --  33*  --   --   --   TROPONINIHS  --  93* 68*  --  79*  --    < > = values in this interval not displayed.    Estimated Creatinine Clearance: 107.4 mL/min (by C-G formula based on SCr of 0.84 mg/dL).  Assessment:  54 yr old male POD#1 I&D L knee and L anterior  thigh abscess to begin IV heparin for possible pulmonary embolism.  Enoxaparin 40 mg SQ was given ~8am today for VTE prophylaxis > discontinued due to Hgb 8 > 7.  Planning repeat CBC now, then to transfuse 2 units. No known bleeding.   Discussed with Dr. Lonny Prude.  Will begin heparin drip without bolus. Conservative dosing to start.  Goal of Therapy:  Heparin level 0.3-0.7 units/ml Monitor platelets by anticoagulation protocol: Yes   Plan:   Heparin drip to begin at 1150 units/hr (~14 units/kg/hr)  Heparin level ~6-8 hrs after drip begins.  Follow up repeat CBC and CTA.  Daily heparin level and CBC while on heparin.  Arty Baumgartner, [place] Phone: [phone] 01/13/2020,4:49 PM

## 2020-01-13 NOTE — Evaluation (Signed)
Physical Therapy Evaluation Patient Details Name: Jason [name] MRN: [id number] DOB: [birth year] Today's Date: 01/13/2020   History of Present Illness  Pt adm lt septic knee arthritis and lt distal femur/proximal tibia osteomyelitis. On 10/28 underwent I&D of lt knee and lt anterior thigh abscess and placement of antibiotic beads and cement. PMH - Rt AKA, rt thr, Munchausen syndrome, HTN, anxiety, arthritis, multiple shoulder surgeries.   Clinical Impression  Pt presents to PT limited by post op pain in lt knee. Eval very limited with pt unable to tolerate any true mobility. Expect mobility will improve with decr in pain. Expect pt will need positive reinforcement to progress.     Follow Up Recommendations Home health PT (If progressess enough to return home)    Equipment Recommendations  Other (comment) (To be determined)    Recommendations for Other Services       Precautions / Restrictions Precautions Precautions: Fall      Mobility  Bed Mobility Overal bed mobility: Needs Assistance             General bed mobility comments: Attempted to have pt come up to long sitting in bed using rails but patient couldn't tolerate due to pain    Transfers                 General transfer comment: Pt unable to attempt due to pain  Ambulation/Gait                Stairs            Wheelchair Mobility    Modified Rankin (Stroke Patients Only)       Balance Overall balance assessment:  (not observed)                                           Pertinent Vitals/Pain Pain Assessment: 0-10 Pain Score: 8  Pain Location: lt knee and leg Pain Descriptors / Indicators: Burning Pain Intervention(s): Limited activity within patient's tolerance;Repositioned;Patient requesting pain meds-RN notified    Home Living Family/patient expects to be discharged to:: Private residence Living Arrangements: Alone Available Help at Discharge:  Friend(s);Available PRN/intermittently Type of Home: House Home Access: Stairs to enter Entrance Stairs-Rails: Right;Left;Can reach both Entrance Stairs-Number of Steps: 4 Home Layout: One level Home Equipment: Shower seat;Crutches (loftstrand crutches) Additional Comments: Has RLE prosthetic    Prior Function Level of Independence: Independent with assistive device(s)         Comments: Uses prosthesis and loft strand crutches. Can amb with or without prosthetic. Drives.     Hand Dominance   Dominant Hand: Left    Extremity/Trunk Assessment   Upper Extremity Assessment Upper Extremity Assessment: Defer to OT evaluation    Lower Extremity Assessment Lower Extremity Assessment: RLE deficits/detail;LLE deficits/detail RLE Deficits / Details: AKA LLE Deficits / Details: Very limited by pain. Able to do ankle pumps and glut sets but unable to tolerate any more movement        Communication   Communication: No difficulties  Cognition Arousal/Alertness: Awake/alert Behavior During Therapy: WFL for tasks assessed/performed Overall Cognitive Status: Within Functional Limits for tasks assessed                                        General Comments  Exercises General Exercises - Upper Extremity Shoulder Flexion: AROM;Both;10 reps;Supine General Exercises - Lower Extremity Ankle Circles/Pumps: AROM;Both;10 reps;Supine Gluteal Sets: AROM;Both;5 reps;Supine   Assessment/Plan    PT Assessment Patient needs continued PT services  PT Problem List Decreased strength;Decreased range of motion;Decreased mobility;Pain;Decreased activity tolerance       PT Treatment Interventions DME instruction;Gait training;Stair training;Functional mobility training;Therapeutic activities;Therapeutic exercise;Balance training;Patient/family education    PT Goals (Current goals can be found in the Care Plan section)  Acute Rehab PT Goals Patient Stated Goal: less  pain PT Goal Formulation: With patient Time For Goal Achievement: 01/27/20 Potential to Achieve Goals: Good    Frequency Min 3X/week   Barriers to discharge Decreased caregiver support Lives alone but states girlfriend lives nearby    Co-evaluation               AM-PAC PT "6 Clicks" Mobility  Outcome Measure Help needed turning from your back to your side while in a flat bed without using bedrails?: Total Help needed moving from lying on your back to sitting on the side of a flat bed without using bedrails?: Total Help needed moving to and from a bed to a chair (including a wheelchair)?: Total Help needed standing up from a chair using your arms (e.g., wheelchair or bedside chair)?: Total Help needed to walk in hospital room?: Total Help needed climbing 3-5 steps with a railing? : Total 6 Click Score: 6    End of Session   Activity Tolerance: Patient limited by pain Patient left: in bed;with call bell/phone within reach;with bed alarm set Nurse Communication: Mobility status PT Visit Diagnosis: Other abnormalities of gait and mobility (R26.89);Pain Pain - Right/Left: Left Pain - part of body: Knee;Leg    Time: 9622-2979 PT Time Calculation (min) (ACUTE ONLY): 16 min   Charges:   PT Evaluation $PT Eval Moderate Complexity: [place] Pager [phone] Office [place] 01/13/2020, 2:21 PM

## 2020-01-13 NOTE — Progress Notes (Signed)
Arrived to patient's room. Currently having another procedure done. Instructed nurse to re-consult when patient is available for [place] to assess for access placement. VU. Fran Lowes, RN VAST

## 2020-01-13 NOTE — Consult Note (Signed)
[facility] for Infectious Disease    Reason for Consult: L knee osteomyelitis   Referring Physician: Dr. Lonny Prude  Assessment: Jason [name] is 54yo male with recent hx L knee septic arthritis, opioid dependence, and known Munchausen admitted 10/27 for septic arthritis and chest pain/syncope work-up. In ED, vancomycin and ceftriaxone started. 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 Patient underwent I&D 10/28 with ortho, cultures at 24hr revealing neutrophilic predominant white count and few gram negative rods. Recommend switching to Unasyn to include anaerobic coverage given recent culture history. Given osteomyelitis patient will need 6 weeks of antibiotics. Plan for patient to return to OR next week.  Recommendations: - D/c vancomycin, ceftriaxone - Start unasyn - Follow surgical wound cultures - Surgical plan per ortho  Antibiotics: Day 2 vancomycin, ceftriaxone  HPI: Jason [name] is a 54 y.o. male with opioid dependence, known Munchausen, prior history of septic arthritis of L knee, previous MRSA infection, R BKA presented to ED via EMS on 10/27 for chest pain, syncopal episode, and sharp L knee pain. HPI obtained via chart review and patient interview. Patient recently hospitalized for septic left knee arthritis at [facility] presumed to be 2/2 Munchausen given the nature of the bacteria found in cultures. Patient was discharged home with three weeks of moxifloxacin and flagyl. Patient says he experienced sharp chest pain and followed by LOC prior to presentation. In ED patient also reports L knee pain. Denies any recent fevers or knee pain s/p recent discharge. Orthopedics consulted, underwent I&D of L knee 10/28 for septic joint. Today patient says he is appreciative of the faculty and staff at [facility] for helping him. Mentions he still has pain but is hopeful it will get better. Denies fevers, chills, abdominal pain, diarrhea.  Review  of Systems: All other systems reviewed and are negative    Past Medical History:  Diagnosis Date   Allergy    Anxiety    Arthritis    left shoulder   Bronchitis    Chest pain 9/39/0300   Complication of anesthesia    DJD (degenerative joint disease) 11/29/2011   Hypertension    Neuromuscular disorder (HCC)    carpal tunnel bilateral, ulner nerve surgery   PONV (postoperative nausea and vomiting)    Septic arthritis of knee, right ([place]) 11/29/2011   Spinal headache    "long time ago"    Social History   Tobacco Use   Smoking status: Never Smoker   Smokeless tobacco: Never Used  Vaping Use   Vaping Use: Never used  Substance Use Topics   Alcohol use: No   Drug use: No    Family History  Problem Relation Age of Onset   Heart attack Father    COPD Father    Diabetes Father    Hypertension Father    Heart attack Sister    Arrhythmia Sister        Long QT syndrome, PPM   Hypertension Mother    Drug abuse Brother     Allergies  Allergen Reactions   Ivp Dye [Iodinated Diagnostic Agents] Anaphylaxis    Can be pre-treated with Benadryl   Metrizamide Anaphylaxis   Other Other (See Comments)    Under no circumstances will the patient agree to a PICC line   Reglan [Metoclopramide] Anaphylaxis   Shellfish-Derived Products Anaphylaxis   Codeine Hives   Lidocaine Other (See Comments) and Rash    Pt not sure if this is an actual  allergy - might have been a one time incident   Methadone Hives   Morphine Swelling and Rash    Local reaction to IV being pushed too fast   Propoxyphene Hives   Toradol [Ketorolac Tromethamine] Itching    Patient having systemic itching after receiving IV dose   Amoxicillin Rash    Patient denies true reaction   Iodine Rash    Reports localized reaction at IV site. Able to tolerate with Benadryl.    Penicillins Rash    Has patient had a PCN reaction causing immediate rash, facial/tongue/throat  swelling, SOB or lightheadedness with hypotension: Yes Has patient had a PCN reaction causing severe rash involving mucus membranes or skin necrosis: No Has patient had a PCN reaction that required hospitalization No Has patient had a PCN reaction occurring within the last 10 years: No If all of the above answers are "NO", then may proceed with Cephalosporin use.    Tape Rash   Vancomycin Rash    Has had vancomycin since this reaction and had no reaction at all    Physical Exam:  Vitals:   01/13/20 0411 01/13/20 0758  BP:  130/81  Pulse:  100  Resp:  20  Temp:  98 F (36.7 C)  SpO2: 100% 99%   Constitutional: Pleasant, no acute distress HENT: Normocephalic, atraumatic Eyes: Aniceteric CV: Tachycardic, regular rhythm. No m/r/g Pulm: Clear to auscultation bilaterally GI: Soft, non-tender, non-distended Extremities: R BKA. L leg wrapped w/ dressing, wound vac in place Skin: Warm, dry Neuro: Awake, alert, oriented x3  Lab Results  Component Value Date   WBC 11.2 (H) 01/13/2020   HGB 7.0 (L) 01/13/2020   HCT 23.3 (L) 01/13/2020   MCV 78.5 (L) 01/13/2020   PLT 422 (H) 01/13/2020    Lab Results  Component Value Date   CREATININE 0.84 01/13/2020   BUN 15 01/13/2020   NA 138 01/13/2020   K 4.9 01/13/2020   CL 105 01/13/2020   CO2 24 01/13/2020    Lab Results  Component Value Date   ALT 16 01/12/2020   AST 16 01/12/2020   ALKPHOS 84 01/12/2020     Microbiology: Recent Results (from the past 240 hour(s))  Respiratory Panel by RT PCR (Flu A&B, Covid) - Nasopharyngeal Swab     Status: None   Collection Time: 01/11/20  8:14 PM   Specimen: Nasopharyngeal Swab  Result Value Ref Range Status   SARS Coronavirus 2 by RT PCR NEGATIVE NEGATIVE Final    Comment: (NOTE) SARS-CoV-2 target nucleic acids are NOT DETECTED.  The SARS-CoV-2 RNA is generally detectable in upper respiratoy specimens during the acute phase of infection. The lowest concentration of SARS-CoV-2  viral copies this assay can detect is 131 copies/mL. A negative result does not preclude SARS-Cov-2 infection and should not be used as the sole basis for treatment or other patient management decisions. A negative result may occur with  improper specimen collection/handling, submission of specimen other than nasopharyngeal swab, presence of viral mutation(s) within the areas targeted by this assay, and inadequate number of viral copies (<131 copies/mL). A negative result must be combined with clinical observations, patient history, and epidemiological information. The expected result is Negative.  Fact Sheet for Patients:  PinkCheek.be  Fact Sheet for Healthcare Providers:  GravelBags.it  This test is no t yet approved or cleared by the [place] FDA and  has been authorized for detection and/or diagnosis of SARS-CoV-2 by FDA under an Emergency Use Authorization (EUA). This EUA  will remain  in effect (meaning this test can be used) for the duration of the COVID-19 declaration under Section 564(b)(1) of the Act, 21 U.S.C. section 360bbb-3(b)(1), unless the authorization is terminated or revoked sooner.     Influenza A by PCR NEGATIVE NEGATIVE Final   Influenza B by PCR NEGATIVE NEGATIVE Final    Comment: (NOTE) The Xpert Xpress SARS-CoV-2/FLU/RSV assay is intended as an aid in  the diagnosis of influenza from Nasopharyngeal swab specimens and  should not be used as a sole basis for treatment. Nasal washings and  aspirates are unacceptable for Xpert Xpress SARS-CoV-2/FLU/RSV  testing.  Fact Sheet for Patients: PinkCheek.be  Fact Sheet for Healthcare Providers: GravelBags.it  This test is not yet approved or cleared by the [place] FDA and  has been authorized for detection and/or diagnosis of SARS-CoV-2 by  FDA under an Emergency Use Authorization (EUA).  This EUA will remain  in effect (meaning this test can be used) for the duration of the  Covid-19 declaration under Section 564(b)(1) of the Act, 21  U.S.C. section 360bbb-3(b)(1), unless the authorization is  terminated or revoked. Performed at [facility], [place] [address], [place], [place] [address]   Surgical pcr screen     Status: None   Collection Time: 01/12/20 10:31 AM   Specimen: Nasal Mucosa; Nasal Swab  Result Value Ref Range Status   MRSA, PCR NEGATIVE NEGATIVE Final   Staphylococcus aureus NEGATIVE NEGATIVE Final    Comment: (NOTE) The Xpert SA Assay (FDA approved for NASAL specimens in patients 69 years of age and older), is one component of a comprehensive surveillance program. It is not intended to diagnose infection nor to guide or monitor treatment. Performed at [facility], [place] [address], [place], [place] [address]   Aerobic/Anaerobic Culture (surgical/deep wound)     Status: None (Preliminary result)   Collection Time: 01/12/20 12:31 PM   Specimen: PATH Other; Tissue  Result Value Ref Range Status   Specimen Description TISSUE  Final   Special Requests LEFT KNEE PUSS SPEC A  Final   Gram Stain PENDING  Incomplete   Culture   Final    NO GROWTH < 24 HOURS Performed at [facility], [place] [address] [place], [place] [address]    Report Status PENDING  Incomplete    Sanjuan Dame, MD Internal Medicine PGY-1 [phone]
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
## 2020-01-13 NOTE — Progress Notes (Signed)
Report given to [name], RN. Patient is alert and oriented x4 resting in bed with call light in reach. VSS. Wound vac WNL and dressing intact with ice pack in place.  Echo to be reattempted today. Lovenox to be started per ortho. Cards to follow up with patient. PRN pain medication being administered regularly.  WBC 11.2, Hgb 7.0, K+ 4.9 and Creat 0.84.

## 2020-01-14 ENCOUNTER — Inpatient Hospital Stay (HOSPITAL_COMMUNITY): Payer: Medicare Other

## 2020-01-14 ENCOUNTER — Inpatient Hospital Stay: Payer: Self-pay

## 2020-01-14 DIAGNOSIS — Z452 Encounter for adjustment and management of vascular access device: Secondary | ICD-10-CM

## 2020-01-14 DIAGNOSIS — R0789 Other chest pain: Secondary | ICD-10-CM | POA: Diagnosis not present

## 2020-01-14 DIAGNOSIS — R55 Syncope and collapse: Secondary | ICD-10-CM | POA: Diagnosis not present

## 2020-01-14 DIAGNOSIS — D500 Iron deficiency anemia secondary to blood loss (chronic): Secondary | ICD-10-CM | POA: Diagnosis not present

## 2020-01-14 DIAGNOSIS — R079 Chest pain, unspecified: Secondary | ICD-10-CM

## 2020-01-14 DIAGNOSIS — R778 Other specified abnormalities of plasma proteins: Secondary | ICD-10-CM | POA: Diagnosis not present

## 2020-01-14 DIAGNOSIS — K625 Hemorrhage of anus and rectum: Secondary | ICD-10-CM | POA: Diagnosis not present

## 2020-01-14 LAB — CBC
HCT: 28.9 % — ABNORMAL LOW (ref 39.0–52.0)
HCT: 25.4 % — ABNORMAL LOW (ref 39.0–52.0)
Hemoglobin: 9 g/dL — ABNORMAL LOW (ref 13.0–17.0)
Hemoglobin: 7.9 g/dL — ABNORMAL LOW (ref 13.0–17.0)
MCH: 25.2 pg — ABNORMAL LOW (ref 26.0–34.0)
MCH: 24.6 pg — ABNORMAL LOW (ref 26.0–34.0)
MCHC: 31.1 g/dL (ref 30.0–36.0)
MCHC: 31.1 g/dL (ref 30.0–36.0)
MCV: 81 fL (ref 80.0–100.0)
MCV: 79.1 fL — ABNORMAL LOW (ref 80.0–100.0)
Platelets: 403 10*3/uL — ABNORMAL HIGH (ref 150–400)
Platelets: 378 10*3/uL (ref 150–400)
RBC: 3.57 MIL/uL — ABNORMAL LOW (ref 4.22–5.81)
RBC: 3.21 MIL/uL — ABNORMAL LOW (ref 4.22–5.81)
RDW: 16.9 % — ABNORMAL HIGH (ref 11.5–15.5)
RDW: 17.3 % — ABNORMAL HIGH (ref 11.5–15.5)
WBC: 13.7 10*3/uL — ABNORMAL HIGH (ref 4.0–10.5)
WBC: 7.7 10*3/uL (ref 4.0–10.5)
nRBC: 0 % (ref 0.0–0.2)
nRBC: 0 % (ref 0.0–0.2)

## 2020-01-14 LAB — BASIC METABOLIC PANEL
Anion gap: 9 (ref 5–15)
BUN: 14 mg/dL (ref 6–20)
CO2: 26 mmol/L (ref 22–32)
Calcium: 8.6 mg/dL — ABNORMAL LOW (ref 8.9–10.3)
Chloride: 104 mmol/L (ref 98–111)
Creatinine, Ser: 0.94 mg/dL (ref 0.61–1.24)
GFR, Estimated: >60 mL/min (ref >60)
Glucose, Bld: 180 mg/dL — ABNORMAL HIGH (ref 70–99)
Potassium: 4.6 mmol/L (ref 3.5–5.1)
Sodium: 139 mmol/L (ref 135–145)

## 2020-01-14 LAB — HEPARIN LEVEL (UNFRACTIONATED)
Heparin Unfractionated: <0.1 IU/mL — ABNORMAL LOW (ref 0.30–0.70)
Heparin Unfractionated: 0.12 IU/mL — ABNORMAL LOW (ref 0.30–0.70)

## 2020-01-14 MED ORDER — DIPHENHYDRAMINE HCL 25 MG PO CAPS: 50.0000 mg | ORAL_CAPSULE | Freq: Once | ORAL | Status: DC

## 2020-01-14 MED ORDER — IOHEXOL 350 MG/ML SOLN
75.0000 mL | Freq: Once | INTRAVENOUS | Status: AC | PRN
  Administered 2020-01-15: 75 mL via INTRAVENOUS

## 2020-01-14 MED ORDER — DIPHENHYDRAMINE HCL 50 MG/ML IJ SOLN: 50.0000 mg | Freq: Once | INTRAMUSCULAR | Status: DC

## 2020-01-14 MED ORDER — DIPHENHYDRAMINE HCL 25 MG PO CAPS
50.0000 mg | ORAL_CAPSULE | Freq: Once | ORAL | Status: AC
  Administered 2020-01-15: 50 mg via ORAL
  Filled 2020-01-14: qty 2

## 2020-01-14 MED ORDER — PREDNISONE 20 MG PO TABS
50.0000 mg | ORAL_TABLET | Freq: Four times a day (QID) | ORAL | Status: AC
  Administered 2020-01-14 – 2020-01-15 (×3): 50 mg via ORAL
  Filled 2020-01-14 (×3): qty 2

## 2020-01-14 MED ORDER — CHLORHEXIDINE GLUCONATE CLOTH 2 % EX PADS
6.0000 | MEDICATED_PAD | Freq: Every day | CUTANEOUS | Status: DC
  Administered 2020-01-15 – 2020-02-01 (×19): 6 via TOPICAL

## 2020-01-14 MED ORDER — DIPHENHYDRAMINE HCL 50 MG/ML IJ SOLN: 50.0000 mg | Freq: Once | INTRAMUSCULAR | Status: AC

## 2020-01-14 MED ORDER — ENOXAPARIN SODIUM 80 MG/0.8ML ~~LOC~~ SOLN
80.0000 mg | Freq: Two times a day (BID) | SUBCUTANEOUS | Status: DC
  Administered 2020-01-14 – 2020-01-15 (×3): 80 mg via SUBCUTANEOUS
  Filled 2020-01-14 (×3): qty 0.8

## 2020-01-14 NOTE — Progress Notes (Signed)
Shell for Lovenox Indication: pulmonary embolus, rule out  Allergies  Allergen Reactions  . Ivp Dye [Iodinated Diagnostic Agents] Anaphylaxis    Can be pre-treated with Benadryl  . Metrizamide Anaphylaxis  . Other Other (See Comments)    Under no circumstances will the patient agree to a PICC line  . Reglan [Metoclopramide] Anaphylaxis  . Shellfish-Derived Products Anaphylaxis  . Codeine Hives  . Lidocaine Other (See Comments) and Rash    Pt not sure if this is an actual allergy - might have been a one time incident  . Methadone Hives  . Morphine Swelling and Rash    Local reaction to IV being pushed too fast  . Propoxyphene Hives  . Toradol [Ketorolac Tromethamine] Itching    Patient having systemic itching after receiving IV dose  . Iodine Rash    Reports localized reaction at IV site. Able to tolerate with Benadryl.   . Tape Rash  . Vancomycin Rash    Has had vancomycin since this reaction and had no reaction at all    Patient Measurements: Height: 5\' 10"  (177.8 cm) Weight: 79.4 kg (175 lb 0.7 oz) IBW/kg (Calculated) : 73 Heparin Dosing Weight: 81.7 kg  Vital Signs: Temp: 98.3 F (36.8 C) (10/30 0326) Temp Source: Oral (10/30 0326) BP: 135/93 (10/30 0326) Pulse Rate: 96 (10/30 0326)  Labs: Recent Labs    01/11/20 1816 01/11/20 2014 01/12/20 0540 01/12/20 0540 01/12/20 1504 01/12/20 1504 01/13/20 0308 01/13/20 0308 01/13/20 1845 01/14/20 0543 01/14/20 0956  HGB   < >  --  8.0*   < > 8.0*   < > 7.0*   < > 6.5* 9.0*  --   HCT   < >  --  26.7*   < > 27.0*   < > 23.3*  --  21.9* 28.9*  --   PLT   < >  --  493*   < > 508*   < > 422*  --  295 403*  --   LABPROT  --   --  15.1  --   --   --   --   --   --   --   --   INR  --   --  1.2  --   --   --   --   --   --   --   --   HEPARINUNFRC  --   --   --   --   --   --   --   --   --  <0.10* 0.12*  CREATININE   < >  --  0.84   < > 1.05  --  0.84  --   --  0.94  --    CKTOTAL  --   --  33*  --   --   --   --   --   --   --   --   TROPONINIHS  --  93* 68*  --  79*  --   --   --   --   --   --    < > = values in this interval not displayed.    Estimated Creatinine Clearance: 96 mL/min (by C-G formula based on SCr of 0.94 mg/dL).   Medical History: Past Medical History:  Diagnosis Date  . Allergy   . Anxiety   . Arthritis    left shoulder  . Bronchitis   . Chest pain 07/14/2016  .  Complication of anesthesia   . DJD (degenerative joint disease) 11/29/2011  . Hypertension   . Neuromuscular disorder ([place])    carpal tunnel bilateral, ulner nerve surgery  . PONV (postoperative nausea and vomiting)   . Septic arthritis of knee, right ([place]) 11/29/2011  . Spinal headache    "long time ago"   Assessment: 54 yr old male POD#2 I&D L knee and L anterior thigh abscess. Pharmacy consulted to switch IV heparin to Lovenox for possible pulmonary embolism given difficult IV access, need for central line placement and CTA today. Discussed patient with Dr. Jovita Kussmaul start Lovenox 1 mg/kg q12h for now (nurse made aware of change) and monitor if need to switch back to heparin if any ortho procedure is planned (per ortho, plans to return to OR Wednesday for repeat I&D).   Pt is s/p 2 u PRBCs on 10/29, Hgb improved from 6.5 to 9.0. Platelets wnl.   Goal of Therapy:  Monitor platelets by anticoagulation protocol: Yes   Plan:  Lovenox 80 mg SQ q12h  Daily CBC Monitor s/sx bleeding Follow up CTA results, ortho plans  Rebbeca Paul, PharmD PGY1 Pharmacy Resident 01/14/2020 12:18 PM  Please check [email or web address] for unit-specific pharmacy phone numbers.

## 2020-01-14 NOTE — Progress Notes (Signed)
Progress Note  Patient Name: KARMINE KAUER Date of Encounter: 01/14/2020  [facility] Cardiologist: Sanda Klein, MD   Subjective   Patient refusing IV access overnight but successful IV placed this AM.   TTE with severely enlarged RV with moderately reduced RV systolic function with concern for Cor Pulmonale. LE doppler ultrasound negative for DVT. Started on heparin gtt; planned for CTA this AM  Inpatient Medications    Scheduled Meds: . sodium chloride   Intravenous Once  . acetaminophen  1,000 mg Oral Q6H  . docusate sodium  100 mg Oral BID  . gabapentin  300 mg Oral TID  . methocarbamol  1,000 mg Oral Q8H   Continuous Infusions: . ampicillin-sulbactam (UNASYN) IV 3 g (01/14/20 0910)  . heparin 1,150 Units/hr (01/14/20 0331)  . methocarbamol (ROBAXIN) IV     PRN Meds: HYDROmorphone (DILAUDID) injection, ondansetron **OR** ondansetron (ZOFRAN) IV, oxyCODONE, oxyCODONE, polyethylene glycol, zinc oxide   Vital Signs    Vitals:   01/14/20 0126 01/14/20 0233 01/14/20 0326 01/14/20 0500  BP: 130/79  (!) 135/93   Pulse: (!) 102 100 96   Resp: 20 18 20    Temp: 98.3 F (36.8 C) 98.1 F (36.7 C) 98.3 F (36.8 C)   TempSrc: Oral Oral Oral   SpO2: 96%  94%   Weight:   79.4 kg   Height:    5\' 10"  (1.778 m)    Intake/Output Summary (Last 24 hours) at 01/14/2020 1043 Last data filed at 01/14/2020 1035 Gross per 24 hour  Intake 734 ml  Output 600 ml  Net 134 ml   Last 3 Weights 01/14/2020 01/12/2020 01/11/2020  Weight (lbs) 175 lb 0.7 oz 180 lb 1.9 oz 191 lb  Weight (kg) 79.4 kg 81.7 kg 86.637 kg      Telemetry    NSR - Personally Reviewed  ECG    No new ecg - Personally Reviewed  Physical Exam   GEN: Laying comfortably in bed  Neck: No JVD Cardiac: Tachycardic, regular, no murmurs Respiratory: Clear to auscultation bilaterally. GI: Soft, nontender, non-distended  MS: Right AKA, left leg wrapped s/p I and D Neuro:  Nonfocal  Psych: Normal  affect   Labs    High Sensitivity Troponin:   Recent Labs  Lab 01/11/20 1816 01/11/20 2014 01/12/20 0540 01/12/20 1504  TROPONINIHS 77* 93* 68* 79*      Chemistry Recent Labs  Lab 01/11/20 1816 01/11/20 1816 01/12/20 0540 01/12/20 0540 01/12/20 1504 01/13/20 0308 01/14/20 0543  NA 137   < > 140  --   --  138 139  K 3.7   < > 3.6  --   --  4.9 4.6  CL 100   < > 107  --   --  105 104  CO2 21*   < > 23  --   --  24 26  GLUCOSE 81   < > 87  --   --  207* 180*  BUN 20   < > 15  --   --  15 14  CREATININE 1.09   < > 0.84   < > 1.05 0.84 0.94  CALCIUM 9.3   < > 8.9  --   --  8.4* 8.6*  PROT 8.1  --  7.5  --   --   --   --   ALBUMIN 2.8*  --  2.5*  --   --   --   --   AST 18  --  16  --   --   --   --   ALT 18  --  16  --   --   --   --   ALKPHOS 102  --  84  --   --   --   --   BILITOT 1.5*  --  1.2  --   --   --   --   GFRNONAA >60   < > >60   < > >60 >60 >60  ANIONGAP 16*   < > 10  --   --  9 9   < > = values in this interval not displayed.     Hematology Recent Labs  Lab 01/13/20 0308 01/13/20 1845 01/14/20 0543  WBC 11.2* 9.4 13.7*  RBC 2.97* 2.73* 3.57*  HGB 7.0* 6.5* 9.0*  HCT 23.3* 21.9* 28.9*  MCV 78.5* 80.2 81.0  MCH 23.6* 23.8* 25.2*  MCHC 30.0 29.7* 31.1  RDW 17.0* 17.4* 16.9*  PLT 422* 295 403*    BNPNo results for input(s): BNP, PROBNP in the last 168 hours.   DDimer  Recent Labs  Lab 01/12/20 0540  DDIMER 9.72*     Radiology    ECHOCARDIOGRAM COMPLETE  Result Date: 01/13/2020    ECHOCARDIOGRAM REPORT   Patient Name:   SHINE MIKES Date of Exam: 01/13/2020 Medical Rec #:  [id number]      Height:       70.0 in Accession #:    [id number]     Weight:       180.1 lb Date of Birth:  1966-01-25      BSA:          1.996 m Patient Age:    54 years       BP:           130/81 mmHg Patient Gender: M              HR:           100 bpm. Exam Location:  Inpatient Procedure: 2D Echo, Cardiac Doppler and Color Doppler Indications:    Elevated troponin   History:        Patient has prior history of Echocardiogram examinations, most                 recent Nov 09, 1967. Signs/Symptoms:Syncope and Chest Pain; Risk                 Factors:Hypertension.  Sonographer:    Clayton Lefort RDCS (AE) Referring Phys: [id number] ANASTASSIA DOUTOVA  Sonographer Comments: No subcostal window. IMPRESSIONS  1. The RV is severely dilated with moderately reduced function. Would consider acute PE to explain tachycardia, syncope, and elevated troponin.  2. Left ventricular ejection fraction, by estimation, is 60 to 65%. The left ventricle has normal function. The left ventricle has no regional wall motion abnormalities. Left ventricular diastolic parameters were normal. There is the interventricular septum is flattened in systole, consistent with right ventricular pressure overload.  3. Right ventricular systolic function is moderately reduced. The right ventricular size is severely enlarged.  4. The mitral valve is grossly normal. Trivial mitral valve regurgitation. No evidence of mitral stenosis.  5. The aortic valve is tricuspid. Aortic valve regurgitation is not visualized. No aortic stenosis is present. Conclusion(s)/Recommendation(s): Findings consistent with Cor Pulmonale. FINDINGS  Left Ventricle: Left ventricular ejection fraction, by estimation, is 60 to 65%. The left ventricle has normal function. The left ventricle has no regional wall motion abnormalities. The  left ventricular internal cavity size was normal in size. There is  no left ventricular hypertrophy. The interventricular septum is flattened in systole, consistent with right ventricular pressure overload. Left ventricular diastolic parameters were normal. Normal left ventricular filling pressure. Right Ventricle: The right ventricular size is severely enlarged. No increase in right ventricular wall thickness. Right ventricular systolic function is moderately reduced. Left Atrium: Left atrial size was normal in size. Right  Atrium: Right atrial size was normal in size. Pericardium: Trivial pericardial effusion is present. Presence of pericardial fat pad. Mitral Valve: The mitral valve is grossly normal. Trivial mitral valve regurgitation. No evidence of mitral valve stenosis. Tricuspid Valve: The tricuspid valve is grossly normal. Tricuspid valve regurgitation is trivial. No evidence of tricuspid stenosis. Aortic Valve: The aortic valve is tricuspid. Aortic valve regurgitation is not visualized. No aortic stenosis is present. Aortic valve mean gradient measures 4.0 mmHg. Aortic valve peak gradient measures 7.2 mmHg. Aortic valve area, by VTI measures 2.58 cm. Pulmonic Valve: The pulmonic valve was grossly normal. Pulmonic valve regurgitation is not visualized. No evidence of pulmonic stenosis. Aorta: The aortic root is normal in size and structure. Venous: The inferior vena cava was not well visualized. IAS/Shunts: The atrial septum is grossly normal.  LEFT VENTRICLE PLAX 2D LVIDd:         4.90 cm  Diastology LVIDs:         2.80 cm  LV e' medial:    8.81 cm/s LV PW:         1.10 cm  LV E/e' medial:  8.2 LV IVS:        1.10 cm  LV e' lateral:   12.80 cm/s LVOT diam:     2.30 cm  LV E/e' lateral: 5.6 LV SV:         55 LV SV Index:   28 LVOT Area:     4.15 cm  RIGHT VENTRICLE RV Basal diam:  2.80 cm RV S prime:     11.50 cm/s TAPSE (M-mode): 1.8 cm LEFT ATRIUM             Index       RIGHT ATRIUM           Index LA diam:        4.00 cm 2.00 cm/m  RA Area:     17.10 cm LA Vol (A2C):   41.8 ml 20.94 ml/m RA Volume:   41.10 ml  20.59 ml/m LA Vol (A4C):   37.2 ml 18.63 ml/m LA Biplane Vol: 40.5 ml 20.29 ml/m  AORTIC VALVE AV Area (Vmax):    2.98 cm AV Area (Vmean):   2.95 cm AV Area (VTI):     2.58 cm AV Vmax:           134.00 cm/s AV Vmean:          99.000 cm/s AV VTI:            0.214 m AV Peak Grad:      7.2 mmHg AV Mean Grad:      4.0 mmHg LVOT Vmax:         96.00 cm/s LVOT Vmean:        70.200 cm/s LVOT VTI:          0.133 m  LVOT/AV VTI ratio: 0.62  AORTA Ao Root diam: 3.40 cm MITRAL VALVE               TRICUSPID VALVE MV Area (PHT): 3.42 cm  TR Peak grad:   26.0 mmHg MV Decel Time: 222 msec    TR Vmax:        255.00 cm/s MV E velocity: 72.00 cm/s MV A velocity: 62.10 cm/s  SHUNTS MV E/A ratio:  1.16        Systemic VTI:  0.13 m                            Systemic Diam: 2.30 cm Eleonore Chiquito MD Electronically signed by Eleonore Chiquito MD Signature Date/Time: 01/13/2020/3:44:25 PM    Final    VAS [place] LOWER EXTREMITY VENOUS (DVT)  Result Date: 01/14/2020  Lower Venous DVTStudy Indications: Suspected PE.  Risk Factors: Surgery 01-12-20 Irrigation and debridement left lower Ext. Limitations: Open wound and Right Above the knee Amputation. Comparison Study: 12-01-2011 Performing Technologist: Griffin Basil RCT RDMS  Examination Guidelines: A complete evaluation includes B-mode imaging, spectral Doppler, color Doppler, and power Doppler as needed of all accessible portions of each vessel. Bilateral testing is considered an integral part of a complete examination. Limited examinations for reoccurring indications may be performed as noted. The reflux portion of the exam is performed with the patient in reverse Trendelenburg.  +---------+---------------+---------+-----------+----------+--------------+ RIGHT    CompressibilityPhasicitySpontaneityPropertiesThrombus Aging +---------+---------------+---------+-----------+----------+--------------+ CFV      Full           Yes      Yes                                 +---------+---------------+---------+-----------+----------+--------------+ SFJ      Full                                                        +---------+---------------+---------+-----------+----------+--------------+ FV Prox  Full                                                        +---------+---------------+---------+-----------+----------+--------------+ FV Mid   Full                                                         +---------+---------------+---------+-----------+----------+--------------+ FV DistalFull                                                        +---------+---------------+---------+-----------+----------+--------------+ Right Above the knee Amputaion.  Right Technical Findings: No DVT seen in the visualized areas.  +-------+---------------+---------+-----------+----------+--------------+ LEFT   CompressibilityPhasicitySpontaneityPropertiesThrombus Aging +-------+---------------+---------+-----------+----------+--------------+ CFV    Full           Yes      Yes                                 +-------+---------------+---------+-----------+----------+--------------+  SFJ    Full                                                        +-------+---------------+---------+-----------+----------+--------------+ FV ProxFull                                                        +-------+---------------+---------+-----------+----------+--------------+ FV Mid Full                                                        +-------+---------------+---------+-----------+----------+--------------+ Left Leg Open wound with Vac and Bandage.  Left Technical Findings: No DVT seen in the visualized areas.   Summary: RIGHT: - There is no evidence of deep vein thrombosis in the lower extremity.  LEFT: - There is no evidence of deep vein thrombosis in the lower extremity.  *See table(s) above for measurements and observations. Electronically signed by Deitra Mayo MD on 01/14/2020 at 8:47:25 AM.    Final     Cardiac Studies   Left heart cath 2018:  Normal coronary arteries.  Low normal LV function. EF estimated to be 50%.  Continuous chest discomfort during the procedure.  RECOMMENDATIONS:   Chest discomfort is not related to obstructive coronary disease.  Consider pericardial or other nonischemic chest pain etiology  IMPRESSIONS    1.  The RV is severely dilated with moderately reduced function. Would  consider acute PE to explain tachycardia, syncope, and elevated troponin.  2. Left ventricular ejection fraction, by estimation, is 60 to 65%. The  left ventricle has normal function. The left ventricle has no regional  wall motion abnormalities. Left ventricular diastolic parameters were  normal. There is the interventricular  septum is flattened in systole, consistent with right ventricular pressure  overload.  3. Right ventricular systolic function is moderately reduced. The right  ventricular size is severely enlarged.  4. The mitral valve is grossly normal. Trivial mitral valve  regurgitation. No evidence of mitral stenosis.  5. The aortic valve is tricuspid. Aortic valve regurgitation is not  visualized. No aortic stenosis is present.   Conclusion(s)/Recommendation(s): Findings consistent with Cor Pulmonale.   FINDINGS  Left Ventricle: Left ventricular ejection fraction, by estimation, is 60  to 65%. The left ventricle has normal function. The left ventricle has no  regional wall motion abnormalities. The left ventricular internal cavity  size was normal in size. There is  no left ventricular hypertrophy. The interventricular septum is flattened  in systole, consistent with right ventricular pressure overload. Left  ventricular diastolic parameters were normal. Normal left ventricular  filling pressure.   Right Ventricle: The right ventricular size is severely enlarged. No  increase in right ventricular wall thickness. Right ventricular systolic  function is moderately reduced.   Left Atrium: Left atrial size was normal in size.   Right Atrium: Right atrial size was normal in size.   Pericardium: Trivial pericardial effusion is present. Presence of  pericardial fat pad.   Mitral Valve: The mitral valve is grossly  normal. Trivial mitral valve  regurgitation. No evidence of mitral valve stenosis.    Tricuspid Valve: The tricuspid valve is grossly normal. Tricuspid valve  regurgitation is trivial. No evidence of tricuspid stenosis.   Aortic Valve: The aortic valve is tricuspid. Aortic valve regurgitation is  not visualized. No aortic stenosis is present. Aortic valve mean gradient  measures 4.0 mmHg. Aortic valve peak gradient measures 7.2 mmHg. Aortic  valve area, by VTI measures 2.58  cm.   Pulmonic Valve: The pulmonic valve was grossly normal. Pulmonic valve  regurgitation is not visualized. No evidence of pulmonic stenosis.   Aorta: The aortic root is normal in size and structure.   Venous: The inferior vena cava was not well visualized.   IAS/Shunts: The atrial septum is grossly normal.   LE Doppler ultrasound 01/13/20: Summary:  RIGHT:  - There is no evidence of deep vein thrombosis in the lower extremity.    LEFT:  - There is no evidence of deep vein thrombosis in the lower extremity.   Patient Profile     54 y.o. male with history of HTN, obesity, right AKA, Munchausen syndrome, and normal coronaries on cath in 2018 who presented with septic knee arthritis and left distal femur/proximal tibia oseto s/p I and D with placement of ABX cement beads and wound vac. Cardiology is consulted for syncope.  Assessment & Plan    #Syncope  #Concern for Cor Pulmonale on TTE #Sinus Tachycardia: #Chest pain Patient with evidence of severe RV dilation and moderate systolic dysfunction in the setting of syncope, elevated trop and sinus tachycardia concerning for acute PE. Difficult IV access overnight but with successful placement this AM. On heparin gtt for [facility]. -TTE with evidence of cor pulmonale -Obtain CTA  -Continue heparin gtt for [facility]; okay with ortho -Monitor H/H closely with AC; received 2units pRBC today  #Septic left knee with left distal femur/proximal tibia oseto: S/p I and D with placement of ABX beads and wound vac per ortho. -Management per ortho team      For questions or updates, please contact [place] Please consult [email or web address] for contact info under        Signed, Freada Bergeron, MD  01/14/2020, 10:43 AM

## 2020-01-14 NOTE — Plan of Care (Signed)
  Problem: Education: Goal: Knowledge of General Education information will improve Description: Including pain rating scale, medication(s)/side effects and non-pharmacologic comfort measures Outcome: Progressing   Problem: Clinical Measurements: Goal: Ability to maintain clinical measurements within normal limits will improve Outcome: Progressing Goal: Will remain free from infection Outcome: Progressing Goal: Diagnostic test results will improve Outcome: Progressing Goal: Cardiovascular complication will be avoided Outcome: Progressing   Problem: Pain Managment: Goal: General experience of comfort will improve Outcome: Progressing   Problem: Safety: Goal: Ability to remain free from injury will improve Outcome: Progressing

## 2020-01-14 NOTE — Progress Notes (Signed)
Pt. Refused for another attempt of IV access. Pt. Stated he preferred a Central line instead than just PIV.This Vast/IV nurse assessed for possible midline placement but unable to fine a suitable viens for midline access at this time.   Pt stated that his veins are so bad that no PIV lasted longer and is tired of being stuck several times. RN made aware.

## 2020-01-14 NOTE — Progress Notes (Addendum)
PROGRESS NOTE    Jason [name]  WNU:[id number] DOB: 27-Oct-1965 DOA: 01/11/2020 PCP: Patient, No Pcp Per   Brief Narrative: Jason [name] is a 54 y.o. male with medical history significant of  Sp AKA, septir arthritis of left knee, h/o opioid dependence,documented Munchausen syndrome. Patient presented secondary to LOC, chest pain and knee pain. Syncope workup in addition to management of septic arthritis.   Assessment & Plan:   Active Problems:   Essential hypertension   S/P AKA (above knee amputation) unilateral, right (HCC)   Other chronic pain   Septic arthritis (HCC)   Syncope and collapse   Elevated troponin   Chest pain   Bright red blood per rectum   Anemia   Chest pain Present on admission and has improved. Cardiology consulted. HS troponin elevated: 77>93>68. Elevated D-dimer of 9.72 and Transthoracic Echocardiogram significant for RV dilation with concern for possible PE. No chest pain currently. -Telemetry -Cardiology recommendations: metoprolol started  Syncope and collapse Story sounds vasovagal. -Transthoracic Echocardiogram and telemetry as mentioned above  Septic arthritis Recurring issue. Per chart review, patient was previously managed at [facility] with concern patient may have Munchausen and is possibly inoculating himself. Previous cultures (10/3) from OSH significant for significant for strep mitis/oralis/anginosus/parasnguinis with blood culture significant for strep anginosus/strepp salivarius/gemella haemolysans (as mentioned in H&P) with susceptibilities to penicillin. Per patient he received two arthrocenteses while at [facility] surgery consulted and patient underwent I&D on 10/28 in the OR with vancomycin/tobramycin cement beads placed. ID consulted by orthopedic surgery on 10/28. Started on Unasyn per ID -ID recommendations: Unasyn IV -Orthopedic surgery recommendations: pain management and plans for  re-exploration  Bright red blood per rectum FOBT negative x1. Hemoglobin tended down slightly from admission but still baseline -FOBT x2 more  RV dilation Concern for acute PE. LE venous duplexes obtained and are negative. Started empiric heparin drip without bolus secondary to significant anemia. No associated symptoms of tachycardia, dyspnea, chest pain at this time. CTA chest ordered but delayed secondary to contrast allergy and need for pre-treatment -Follow-up CTA chest -Continue heparin drip pending CTA results; would stick with heparin drip secondary to possible future intervention by ortho if negative for PE  Acute on chronic Anemia Unknown etiology. Baseline appears to be between 8-9. Worsened hemoglobin with a low of 6.5 on 10/29. Ordered 2 units of PRBC but he received 1.5 secondary to IV infiltration of the second unit. Resultant rebound hemoglobin of 9.0 which does not seem to correlate well. Now on heparin drip for possible PE -Repeat CBC this afternoon   DVT prophylaxis: SCDs Code Status:   Code Status: Full Code Family Communication: None at bedside Disposition Plan: Discharge home pending orthopedic surgery management and ID recommendations for antibiotics in addition to workup for possible PE   Consultants:   Orthopedic surgery  Cardiology  Procedures:   INCISION & DRAINAGE (10/28) 1. Incision and drainage of left septic knee 2. Incision and drainage of left anterior thigh abscess 3. Placement of antibiotic cement  4. Incisional wound vac placement   TRANSTHORACIC ECHOCARDIOGRAM (10/29) IMPRESSIONS    1. The RV is severely dilated with moderately reduced function. Would  consider acute PE to explain tachycardia, syncope, and elevated troponin.  2. Left ventricular ejection fraction, by estimation, is 60 to 65%. The  left ventricle has normal function. The left ventricle has no regional  wall motion abnormalities. Left ventricular diastolic parameters  were  normal. There is the interventricular  septum  is flattened in systole, consistent with right ventricular pressure  overload.  3. Right ventricular systolic function is moderately reduced. The right  ventricular size is severely enlarged.  4. The mitral valve is grossly normal. Trivial mitral valve  regurgitation. No evidence of mitral stenosis.  5. The aortic valve is tricuspid. Aortic valve regurgitation is not  visualized. No aortic stenosis is present.   Conclusion(s)/Recommendation(s): Findings consistent with Cor Pulmonale.  Antimicrobials:  Unasyn   Subjective: Still with some knee pain but this has improved. No chest pain or dyspnea.  Objective: Vitals:   01/14/20 0126 01/14/20 0233 01/14/20 0326 01/14/20 0500  BP: 130/79  (!) 135/93   Pulse: (!) 102 100 96   Resp: 20 18 20    Temp: 98.3 F (36.8 C) 98.1 F (36.7 C) 98.3 F (36.8 C)   TempSrc: Oral Oral Oral   SpO2: 96%  94%   Weight:   79.4 kg   Height:    5\' 10"  (1.778 m)    Intake/Output Summary (Last 24 hours) at 01/14/2020 1108 Last data filed at 01/14/2020 1035 Gross per 24 hour  Intake 734 ml  Output 600 ml  Net 134 ml   Filed Weights   01/11/20 1749 01/12/20 0600 01/14/20 0326  Weight: 86.6 kg 81.7 kg 79.4 kg    Examination:  General exam: Appears calm and comfortable Respiratory system: Clear to auscultation. Respiratory effort normal. Cardiovascular system: S1 & S2 heard, RRR. No murmurs, rubs, gallops or clicks. Gastrointestinal system: Abdomen is nondistended, soft and nontender. No organomegaly or masses felt. Normal bowel sounds heard. Central nervous system: Somnolent but easily arouses and is oriented. No focal neurological deficits. Musculoskeletal: Right BKA. Left leg in ACE bandage Skin: No cyanosis. No rashes Psychiatry: Judgement and insight appear normal. Mood & affect appropriate.     Data Reviewed: I have personally reviewed following labs and imaging  studies  CBC Lab Results  Component Value Date   WBC 13.7 (H) 01/14/2020   RBC 3.57 (L) 01/14/2020   HGB 9.0 (L) 01/14/2020   HCT 28.9 (L) 01/14/2020   MCV 81.0 01/14/2020   MCH 25.2 (L) 01/14/2020   PLT 403 (H) 01/14/2020   MCHC 31.1 01/14/2020   RDW 16.9 (H) 01/14/2020   LYMPHSABS 1.1 01/12/2020   MONOABS 0.8 01/12/2020   EOSABS 0.2 01/12/2020   BASOSABS 0.0 71/69/6789     Last metabolic panel Lab Results  Component Value Date   NA 139 01/14/2020   K 4.6 01/14/2020   CL 104 01/14/2020   CO2 26 01/14/2020   BUN 14 01/14/2020   CREATININE 0.94 01/14/2020   GLUCOSE 180 (H) 01/14/2020   GFRNONAA >60 01/14/2020   GFRAA >60 08/18/2018   CALCIUM 8.6 (L) 01/14/2020   PHOS 3.8 01/12/2020   PROT 7.5 01/12/2020   ALBUMIN 2.5 (L) 01/12/2020   LABGLOB 2.6 08/14/2017   AGRATIO 1.9 08/14/2017   BILITOT 1.2 01/12/2020   ALKPHOS 84 01/12/2020   AST 16 01/12/2020   ALT 16 01/12/2020   ANIONGAP 9 01/14/2020    CBG (last 3)  Recent Labs    01/12/20 0829 01/12/20 2219 01/13/20 2056  GLUCAP 81 190* 109*     GFR: Estimated Creatinine Clearance: 96 mL/min (by C-G formula based on SCr of 0.94 mg/dL).  Coagulation Profile: Recent Labs  Lab 01/12/20 0540  INR 1.2    Recent Results (from the past 240 hour(s))  Respiratory Panel by RT PCR (Flu A&B, Covid) - Nasopharyngeal Swab  Status: None   Collection Time: 01/11/20  8:14 PM   Specimen: Nasopharyngeal Swab  Result Value Ref Range Status   SARS Coronavirus 2 by RT PCR NEGATIVE NEGATIVE Final    Comment: (NOTE) SARS-CoV-2 target nucleic acids are NOT DETECTED.  The SARS-CoV-2 RNA is generally detectable in upper respiratoy specimens during the acute phase of infection. The lowest concentration of SARS-CoV-2 viral copies this assay can detect is 131 copies/mL. A negative result does not preclude SARS-Cov-2 infection and should not be used as the sole basis for treatment or other patient management decisions. A  negative result may occur with  improper specimen collection/handling, submission of specimen other than nasopharyngeal swab, presence of viral mutation(s) within the areas targeted by this assay, and inadequate number of viral copies (<131 copies/mL). A negative result must be combined with clinical observations, patient history, and epidemiological information. The expected result is Negative.  Fact Sheet for Patients:  PinkCheek.be  Fact Sheet for Healthcare Providers:  GravelBags.it  This test is no t yet approved or cleared by the [place] FDA and  has been authorized for detection and/or diagnosis of SARS-CoV-2 by FDA under an Emergency Use Authorization (EUA). This EUA will remain  in effect (meaning this test can be used) for the duration of the COVID-19 declaration under Section 564(b)(1) of the Act, 21 U.S.C. section 360bbb-3(b)(1), unless the authorization is terminated or revoked sooner.     Influenza A by PCR NEGATIVE NEGATIVE Final   Influenza B by PCR NEGATIVE NEGATIVE Final    Comment: (NOTE) The Xpert Xpress SARS-CoV-2/FLU/RSV assay is intended as an aid in  the diagnosis of influenza from Nasopharyngeal swab specimens and  should not be used as a sole basis for treatment. Nasal washings and  aspirates are unacceptable for Xpert Xpress SARS-CoV-2/FLU/RSV  testing.  Fact Sheet for Patients: PinkCheek.be  Fact Sheet for Healthcare Providers: GravelBags.it  This test is not yet approved or cleared by the [place] FDA and  has been authorized for detection and/or diagnosis of SARS-CoV-2 by  FDA under an Emergency Use Authorization (EUA). This EUA will remain  in effect (meaning this test can be used) for the duration of the  Covid-19 declaration under Section 564(b)(1) of the Act, 21  U.S.C. section 360bbb-3(b)(1), unless the authorization  is  terminated or revoked. Performed at [facility], [place] [address], [place], [place] [address]   Culture, blood (Routine X 2) w Reflex to ID Panel     Status: None (Preliminary result)   Collection Time: 01/12/20  5:39 AM   Specimen: BLOOD LEFT HAND  Result Value Ref Range Status   Specimen Description BLOOD LEFT HAND  Final   Special Requests   Final    BOTTLES DRAWN AEROBIC AND ANAEROBIC Blood Culture adequate volume   Culture   Final    NO GROWTH 1 DAY Performed at [facility], [place] [address], [place], [place] [address]    Report Status PENDING  Incomplete  Surgical pcr screen     Status: None   Collection Time: 01/12/20 10:31 AM   Specimen: Nasal Mucosa; Nasal Swab  Result Value Ref Range Status   MRSA, PCR NEGATIVE NEGATIVE Final   Staphylococcus aureus NEGATIVE NEGATIVE Final    Comment: (NOTE) The Xpert SA Assay (FDA approved for NASAL specimens in patients 31 years of age and older), is one component of a comprehensive surveillance program. It is not intended to diagnose infection nor to guide or  monitor treatment. Performed at [facility], [place] [address], [place], [place] [address]   Aerobic/Anaerobic Culture (surgical/deep wound)     Status: None (Preliminary result)   Collection Time: 01/12/20 12:31 PM   Specimen: PATH Other; Tissue  Result Value Ref Range Status   Specimen Description TISSUE  Final   Special Requests LEFT KNEE PUSS SPEC A  Final   Gram Stain   Final    MODERATE WBC PRESENT, PREDOMINANTLY PMN FEW GRAM NEGATIVE RODS    Culture   Final    NO GROWTH < 24 HOURS Performed at [facility], [place] [address], [place], [place] [address]    Report Status PENDING  Incomplete  Culture, blood (Routine X 2) w Reflex to ID Panel     Status: None (Preliminary result)   Collection Time: 01/12/20  3:04 PM   Specimen: BLOOD  Result Value Ref Range Status   Specimen Description BLOOD LEFT ANTECUBITAL  Final   Special Requests    Final    BOTTLES DRAWN AEROBIC AND ANAEROBIC Blood Culture adequate volume   Culture   Final    NO GROWTH < 24 HOURS Performed at [facility], [place] [address] [place], [place] [address]    Report Status PENDING  Incomplete        Radiology Studies: ECHOCARDIOGRAM COMPLETE  Result Date: 01/13/2020    ECHOCARDIOGRAM REPORT   Patient Name:   TAKARI LUNDAHL Date of Exam: 01/13/2020 Medical Rec #:  [id number]      Height:       70.0 in Accession #:    [id number]     Weight:       180.1 lb Date of Birth:  [birth year]      BSA:          1.996 m Patient Age:    14 years       BP:           130/81 mmHg Patient Gender: M              HR:           100 bpm. Exam Location:  Inpatient Procedure: 2D Echo, Cardiac Doppler and Color Doppler Indications:    Elevated troponin  History:        Patient has prior history of Echocardiogram examinations, most                 recent 1965/10/26. Signs/Symptoms:Syncope and Chest Pain; Risk                 Factors:Hypertension.  Sonographer:    Clayton Lefort RDCS (AE) Referring Phys: [id number] ANASTASSIA DOUTOVA  Sonographer Comments: No subcostal window. IMPRESSIONS  1. The RV is severely dilated with moderately reduced function. Would consider acute PE to explain tachycardia, syncope, and elevated troponin.  2. Left ventricular ejection fraction, by estimation, is 60 to 65%. The left ventricle has normal function. The left ventricle has no regional wall motion abnormalities. Left ventricular diastolic parameters were normal. There is the interventricular septum is flattened in systole, consistent with right ventricular pressure overload.  3. Right ventricular systolic function is moderately reduced. The right ventricular size is severely enlarged.  4. The mitral valve is grossly normal. Trivial mitral valve regurgitation. No evidence of mitral stenosis.  5. The aortic valve is tricuspid. Aortic valve regurgitation is not visualized. No aortic stenosis is present.  Conclusion(s)/Recommendation(s): Findings consistent with Cor Pulmonale. FINDINGS  Left Ventricle: Left ventricular  ejection fraction, by estimation, is 60 to 65%. The left ventricle has normal function. The left ventricle has no regional wall motion abnormalities. The left ventricular internal cavity size was normal in size. There is  no left ventricular hypertrophy. The interventricular septum is flattened in systole, consistent with right ventricular pressure overload. Left ventricular diastolic parameters were normal. Normal left ventricular filling pressure. Right Ventricle: The right ventricular size is severely enlarged. No increase in right ventricular wall thickness. Right ventricular systolic function is moderately reduced. Left Atrium: Left atrial size was normal in size. Right Atrium: Right atrial size was normal in size. Pericardium: Trivial pericardial effusion is present. Presence of pericardial fat pad. Mitral Valve: The mitral valve is grossly normal. Trivial mitral valve regurgitation. No evidence of mitral valve stenosis. Tricuspid Valve: The tricuspid valve is grossly normal. Tricuspid valve regurgitation is trivial. No evidence of tricuspid stenosis. Aortic Valve: The aortic valve is tricuspid. Aortic valve regurgitation is not visualized. No aortic stenosis is present. Aortic valve mean gradient measures 4.0 mmHg. Aortic valve peak gradient measures 7.2 mmHg. Aortic valve area, by VTI measures 2.58 cm. Pulmonic Valve: The pulmonic valve was grossly normal. Pulmonic valve regurgitation is not visualized. No evidence of pulmonic stenosis. Aorta: The aortic root is normal in size and structure. Venous: The inferior vena cava was not well visualized. IAS/Shunts: The atrial septum is grossly normal.  LEFT VENTRICLE PLAX 2D LVIDd:         4.90 cm  Diastology LVIDs:         2.80 cm  LV e' medial:    8.81 cm/s LV PW:         1.10 cm  LV E/e' medial:  8.2 LV IVS:        1.10 cm  LV e' lateral:   12.80  cm/s LVOT diam:     2.30 cm  LV E/e' lateral: 5.6 LV SV:         55 LV SV Index:   28 LVOT Area:     4.15 cm  RIGHT VENTRICLE RV Basal diam:  2.80 cm RV S prime:     11.50 cm/s TAPSE (M-mode): 1.8 cm LEFT ATRIUM             Index       RIGHT ATRIUM           Index LA diam:        4.00 cm 2.00 cm/m  RA Area:     17.10 cm LA Vol (A2C):   41.8 ml 20.94 ml/m RA Volume:   41.10 ml  20.59 ml/m LA Vol (A4C):   37.2 ml 18.63 ml/m LA Biplane Vol: 40.5 ml 20.29 ml/m  AORTIC VALVE AV Area (Vmax):    2.98 cm AV Area (Vmean):   2.95 cm AV Area (VTI):     2.58 cm AV Vmax:           134.00 cm/s AV Vmean:          99.000 cm/s AV VTI:            0.214 m AV Peak Grad:      7.2 mmHg AV Mean Grad:      4.0 mmHg LVOT Vmax:         96.00 cm/s LVOT Vmean:        70.200 cm/s LVOT VTI:          0.133 m LVOT/AV VTI ratio: 0.62  AORTA Ao Root diam: 3.40 cm MITRAL  VALVE               TRICUSPID VALVE MV Area (PHT): 3.42 cm    TR Peak grad:   26.0 mmHg MV Decel Time: 222 msec    TR Vmax:        255.00 cm/s MV E velocity: 72.00 cm/s MV A velocity: 62.10 cm/s  SHUNTS MV E/A ratio:  1.16        Systemic VTI:  0.13 m                            Systemic Diam: 2.30 cm Eleonore Chiquito MD Electronically signed by Eleonore Chiquito MD Signature Date/Time: 01/13/2020/3:44:25 PM    Final    VAS [place] LOWER EXTREMITY VENOUS (DVT)  Result Date: 01/14/2020  Lower Venous DVTStudy Indications: Suspected PE.  Risk Factors: Surgery 01-12-20 Irrigation and debridement left lower Ext. Limitations: Open wound and Right Above the knee Amputation. Comparison Study: 12-01-2011 Performing Technologist: Griffin Basil RCT RDMS  Examination Guidelines: A complete evaluation includes B-mode imaging, spectral Doppler, color Doppler, and power Doppler as needed of all accessible portions of each vessel. Bilateral testing is considered an integral part of a complete examination. Limited examinations for reoccurring indications may be performed as noted. The reflux  portion of the exam is performed with the patient in reverse Trendelenburg.  +---------+---------------+---------+-----------+----------+--------------+ RIGHT    CompressibilityPhasicitySpontaneityPropertiesThrombus Aging +---------+---------------+---------+-----------+----------+--------------+ CFV      Full           Yes      Yes                                 +---------+---------------+---------+-----------+----------+--------------+ SFJ      Full                                                        +---------+---------------+---------+-----------+----------+--------------+ FV Prox  Full                                                        +---------+---------------+---------+-----------+----------+--------------+ FV Mid   Full                                                        +---------+---------------+---------+-----------+----------+--------------+ FV DistalFull                                                        +---------+---------------+---------+-----------+----------+--------------+ Right Above the knee Amputaion.  Right Technical Findings: No DVT seen in the visualized areas.  +-------+---------------+---------+-----------+----------+--------------+ LEFT   CompressibilityPhasicitySpontaneityPropertiesThrombus Aging +-------+---------------+---------+-----------+----------+--------------+ CFV    Full           Yes      Yes                                 +-------+---------------+---------+-----------+----------+--------------+  SFJ    Full                                                        +-------+---------------+---------+-----------+----------+--------------+ FV ProxFull                                                        +-------+---------------+---------+-----------+----------+--------------+ FV Mid Full                                                         +-------+---------------+---------+-----------+----------+--------------+ Left Leg Open wound with Vac and Bandage.  Left Technical Findings: No DVT seen in the visualized areas.   Summary: RIGHT: - There is no evidence of deep vein thrombosis in the lower extremity.  LEFT: - There is no evidence of deep vein thrombosis in the lower extremity.  *See table(s) above for measurements and observations. Electronically signed by Deitra Mayo MD on 01/14/2020 at 8:47:25 AM.    Final         Scheduled Meds: . sodium chloride   Intravenous Once  . acetaminophen  1,000 mg Oral Q6H  . docusate sodium  100 mg Oral BID  . gabapentin  300 mg Oral TID  . methocarbamol  1,000 mg Oral Q8H   Continuous Infusions: . ampicillin-sulbactam (UNASYN) IV 3 g (01/14/20 0910)  . heparin 1,150 Units/hr (01/14/20 0331)  . methocarbamol (ROBAXIN) IV       LOS: 3 days     Cordelia Poche, MD [facility] 01/14/2020, 11:08 AM  If 7PM-7AM, please contact night-coverage [email or web address]

## 2020-01-14 NOTE — Progress Notes (Signed)
Blood transfusion of second unit stopped due to IV infiltration. Pt received 120 cc of packed red cell.

## 2020-01-14 NOTE — Progress Notes (Signed)
Orthopaedic Trauma Progress Note  S: Doing okay this morning, notes pain throughout left leg.  Doing well moving the ankle but continues having difficulty with knee motion.  Pain medications helping some. Cultures obtained intraoperatively still pending. ID has seen patient and adjusted patient's antibiotics.    O:  Vitals:   01/14/20 0233 01/14/20 0326  BP:  (!) 135/93  Pulse: 100 96  Resp: 18 20  Temp: 98.1 F (36.7 C) 98.3 F (36.8 C)  SpO2:  94%    General: NAD. Sitting up in bed Respiratory:  No increased work of breathing.  Left lower extremity: Dressing CDI. Incisional wound vac with good seal and function, total of about 30 mL output currently. Ankle dorsiflexion/plantarflexion is intact. Tolerates minimal knee motion which appears to be baseline. Endorses sensation to light touch of dorsal and plantar aspect of foot. Otherwise neurovascularly intact.   Labs:  Results for orders placed or performed during the hospital encounter of 01/11/20 (from the past 24 hour(s))  CBC     Status: Abnormal   Collection Time: 01/13/20  6:45 PM  Result Value Ref Range   WBC 9.4 4.0 - 10.5 K/uL   RBC 2.73 (L) 4.22 - 5.81 MIL/uL   Hemoglobin 6.5 (LL) 13.0 - 17.0 g/dL   HCT 21.9 (L) 39 - 52 %   MCV 80.2 80.0 - 100.0 fL   MCH 23.8 (L) 26.0 - 34.0 pg   MCHC 29.7 (L) 30.0 - 36.0 g/dL   RDW 17.4 (H) 11.5 - 15.5 %   Platelets 295 150 - 400 K/uL   nRBC 0.0 0.0 - 0.2 %  Prepare RBC (crossmatch)     Status: None   Collection Time: 01/13/20  6:45 PM  Result Value Ref Range   Order Confirmation      ORDER PROCESSED BY BLOOD BANK Performed at [facility], [address] [place], [place] [address]   Type and screen [place]     Status: None (Preliminary result)   Collection Time: 01/13/20  6:56 PM  Result Value Ref Range   ABO/RH(D) O NEG    Antibody Screen NEG    Sample Expiration 01/16/2020,2359    Unit Number [id number]    Blood Component Type RED CELLS,LR     Unit division 00    Status of Unit ISSUED,FINAL    Transfusion Status OK TO TRANSFUSE    Crossmatch Result      Compatible Performed at [facility], [place] [address] [place], [place] [address]    Unit Number [id number]    Blood Component Type RED CELLS,LR    Unit division 00    Status of Unit ISSUED    Transfusion Status OK TO TRANSFUSE    Crossmatch Result Compatible   Glucose, capillary     Status: Abnormal   Collection Time: 01/13/20  8:56 PM  Result Value Ref Range   Glucose-Capillary 109 (H) 70 - 99 mg/dL  Heparin level (unfractionated)     Status: Abnormal   Collection Time: 01/14/20  5:43 AM  Result Value Ref Range   Heparin Unfractionated <0.10 (L) 0.30 - 0.70 IU/mL  Basic metabolic panel     Status: Abnormal   Collection Time: 01/14/20  5:43 AM  Result Value Ref Range   Sodium 139 135 - 145 mmol/L   Potassium 4.6 3.5 - 5.1 mmol/L   Chloride 104 98 - 111 mmol/L   CO2 26 22 - 32 mmol/L   Glucose, Bld  180 (H) 70 - 99 mg/dL   BUN 14 6 - 20 mg/dL   Creatinine, Ser 0.94 0.61 - 1.24 mg/dL   Calcium 8.6 (L) 8.9 - 10.3 mg/dL   GFR, Estimated >60 >60 mL/min   Anion gap 9 5 - 15  CBC     Status: Abnormal   Collection Time: 01/14/20  5:43 AM  Result Value Ref Range   WBC 13.7 (H) 4.0 - 10.5 K/uL   RBC 3.57 (L) 4.22 - 5.81 MIL/uL   Hemoglobin 9.0 (L) 13.0 - 17.0 g/dL   HCT 28.9 (L) 39 - 52 %   MCV 81.0 80.0 - 100.0 fL   MCH 25.2 (L) 26.0 - 34.0 pg   MCHC 31.1 30.0 - 36.0 g/dL   RDW 16.9 (H) 11.5 - 15.5 %   Platelets 403 (H) 150 - 400 K/uL   nRBC 0.0 0.0 - 0.2 %    Assessment: 54 year old male with left septic knee arthritis and left distal femur/proximal tibia osteomyelitis s/p I&D with placement of antibiotic cement beads and incisional wound VAC, 2 Days Post-Op    Weightbearing: WBAT LLE  Insicional and dressing care: Leave incisional wound VAC in place  Orthopedic device(s): Wound Vac:LLE   CV/Blood loss: Acute blood loss anemia, Hgb 9.0 this morning.   Received 2 units PRBCs on 01/13/2020  Pain management:  1. Tylenol 1000 mg q 6 hours scheduled 2. Robaxin 1000 mg q 8 hours 3. Oxycodone 5-15 mg q 4 hours PRN 4. Neurontin 300 mg TID 5. Dilaudid 1 mg q 3 hours PRN  VTE prophylaxis: Heparin  ID: Per infectious disease team  Foley/Lines: No foley, KVO IVFs  Medical co-morbidities:  S/P right AKA, hx of septic arthritis of left knee, h/o opioid dependence,documented Munchausen syndrome   Dispo: Therapies as tolerated. Plan consultation by Dr. Sharol Given on Tuesday with plans for return to OR Wednesday for repeat I&D.  Follow - up plan: To be determined  Contact information:  Katha Hamming MD, Patrecia Pace PA-C   [facility]illo [facility] [phone] (office) [email or web address]

## 2020-01-14 NOTE — Progress Notes (Signed)
Attempted to place PICC in RUE.  One vessel, basilic, noted to be sufficient for PICC placement, but quickly constricted, unable to thread guidewire.  PICC occupancy in cephalic vein 96%, brachial veins non compressible and unable to track proximally. LUE also without suitable veins, many appear to be collateral vessels, too small to measure occupancy.  Please refer PICC placement to IR or place CVC.

## 2020-01-14 NOTE — Procedures (Signed)
Central Venous Catheter Insertion Procedure Note  AIRIK GOODLIN  [id number]  1966-01-29  Date:01/14/20  Time:6:40 PM   Provider Performing:Pete Johnette Abraham Kary Kos   Procedure: Insertion of Non-tunneled Central Venous [phone]) with US guidance ([id number])   Indication(s) Difficult access  Consent Risks of the procedure as well as the alternatives and risks of each were explained to the patient and/or caregiver.  Consent for the procedure was obtained and is signed in the bedside chart  Anesthesia Topical only with 1% lidocaine   Timeout Verified patient identification, verified procedure, site/side was marked, verified correct patient position, special equipment/implants available, medications/allergies/relevant history reviewed, required imaging and test results available.  Sterile Technique Maximal sterile technique including full sterile barrier drape, hand hygiene, sterile gown, sterile gloves, mask, hair covering, sterile ultrasound probe cover (if used).  Procedure Description Area of catheter insertion was cleaned with chlorhexidine and draped in sterile fashion.  With real-time ultrasound guidance a central venous catheter was placed into the right internal jugular vein. Nonpulsatile blood flow and easy flushing noted in all ports.  The catheter was sutured in place and sterile dressing applied.  Complications/Tolerance None; patient tolerated the procedure well. Chest X-ray is ordered to verify placement for internal jugular or subclavian cannulation.   Chest x-ray is not ordered for femoral cannulation.  EBL Minimal  Specimen(s) None Erick Colace ACNP-BC [place] Pager # [phone] OR # [phone] if no answer

## 2020-01-14 NOTE — Progress Notes (Signed)
Called by RN that pt has lost IV access.  IV team was called but pt refused peripheral IV line. IV team discussed a central line with patient and pt stated he wanted a central line.  Pt has documented Munchhausen syndrome.  The risk of central line is significant and a peripheral line or PICC line would be the best option.  I did discuss with Critical Care on call physician who stated that he does not think central line is warranted or justified and that IV team needs to attempt PICC line.

## 2020-01-14 NOTE — Evaluation (Signed)
Occupational Therapy Evaluation Patient Details Name: Jason [name] MRN: [id number] DOB: April 11, 1965 Today's Date: 01/14/2020    History of Present Illness Pt adm lt septic knee arthritis and lt distal femur/proximal tibia osteomyelitis. On 10/28 underwent I&D of lt knee and lt anterior thigh abscess and placement of antibiotic beads and cement. PMH - Rt AKA, rt thr, Munchausen syndrome, HTN, anxiety, arthritis, multiple shoulder surgeries.    Clinical Impression   Patient admitted with the diagnosis and procedure above.  Presents with very low threshold for pain, and self limiting behavior.  OT will follow in the acute setting, however, OT will start at 1x/wk and adjust if his participation improves.  He is possibly scheduled for a repeat I&D for Wednesday 11/3.  OT recommends SNF with 24 hour assist as needed unless he can move better and care for himself.     Follow Up Recommendations  SNF;Supervision/Assistance - 24 hour    Equipment Recommendations  3 in 1 bedside commode;Wheelchair (measurements OT);Wheelchair cushion (measurements OT)    Recommendations for Other Services       Precautions / Restrictions Precautions Precautions: Fall Restrictions Weight Bearing Restrictions: Yes LLE Weight Bearing: Weight bearing as tolerated Other Position/Activity Restrictions: Wound vac.  R AKA      Mobility Bed Mobility Overal bed mobility: Needs Assistance Bed Mobility: Rolling Rolling: Max assist         General bed mobility comments: Does not initiate due to low pain threshold.  Declined EOB sitting.    Transfers                 General transfer comment: Declined - low pain threshold                                               ADL either performed or assessed with clinical judgement   ADL Overall ADL's : Needs assistance/impaired     Grooming: Wash/dry hands;Wash/dry face;Set up;Bed level       Lower Body Bathing: Maximal  assistance;Bed level       Lower Body Dressing: Maximal assistance;Bed level     Toilet Transfer Details (indicate cue type and reason): Declines attempt                 Vision Baseline Vision/History: No visual deficits Patient Visual Report: No change from baseline       Perception     Praxis      Pertinent Vitals/Pain Pain Score: 9  Pain Location: lt knee and leg Pain Descriptors / Indicators: Guarding;Grimacing Pain Intervention(s): Limited activity within patient's tolerance     Hand Dominance Left   Extremity/Trunk Assessment Upper Extremity Assessment Upper Extremity Assessment: Overall WFL for tasks assessed           Communication Communication Communication: No difficulties   Cognition Arousal/Alertness: Awake/alert Behavior During Therapy: WFL for tasks assessed/performed Overall Cognitive Status: Within Functional Limits for tasks assessed                                     General Comments       Exercises     Shoulder Instructions      Home Living Family/patient expects to be discharged to:: Skilled nursing facility Living Arrangements: Alone Available Help at Discharge: Friend(s);Available PRN/intermittently Type  of Home: House Home Access: Stairs to enter [employer] of Steps: 4 Entrance Stairs-Rails: Right;Left;Can reach both Home Layout: One level     Bathroom Shower/Tub: Teacher, early years/pre: Standard     Home Equipment: Office manager Comments: Has RLE prosthetic      Prior Functioning/Environment Level of Independence: Independent with assistive device(s)        Comments: Per PT eval: Uses prosthesis and loft strand crutches. Can amb with or without prosthetic. Drives.        OT Problem List: Pain      OT Treatment/Interventions: Self-care/ADL training;DME and/or AE instruction;Balance training;Patient/family education;Therapeutic activities     OT Goals(Current goals can be found in the care plan section) Acute Rehab OT Goals Patient Stated Goal: I need better pain management OT Goal Formulation: With patient Time For Goal Achievement: 01/30/20 Potential to Achieve Goals: Fair ADL Goals Pt Will Perform Grooming: with set-up;sitting Pt Will Perform Lower Body Bathing: with set-up;sit to/from stand Pt Will Perform Lower Body Dressing: with set-up;sit to/from stand Pt Will Transfer to Toilet: with min guard assist;ambulating;regular height toilet  OT Frequency: Min 1X/week   Barriers to D/C: Decreased caregiver support          Co-evaluation              AM-PAC OT "6 Clicks" Daily Activity     Outcome Measure Help from another person eating meals?: None Help from another person taking care of personal grooming?: A Little Help from another person toileting, which includes using toliet, bedpan, or urinal?: A Lot Help from another person bathing (including washing, rinsing, drying)?: A Lot Help from another person to put on and taking off regular upper body clothing?: A Little Help from another person to put on and taking off regular lower body clothing?: A Lot 6 Click Score: 16   End of Session    Activity Tolerance: Patient limited by pain Patient left: in bed;with call bell/phone within reach  OT Visit Diagnosis: Pain Pain - Right/Left: Left Pain - part of body: Leg                Time: 3419-3790 OT Time Calculation (min): 14 min Charges:  OT General Charges $OT Visit: 1 Visit OT Evaluation $OT Eval Moderate Complexity: 1 Mod  01/14/2020  Rich, OTR/L  Acute Rehabilitation Services  Office:  [phone]   Metta Clines 01/14/2020, 4:16 PM

## 2020-01-14 NOTE — Progress Notes (Signed)
Per IV team nurse request, I have called on call MD for possible central line placement.

## 2020-01-15 ENCOUNTER — Inpatient Hospital Stay (HOSPITAL_COMMUNITY): Payer: Medicare Other

## 2020-01-15 DIAGNOSIS — M00862 Arthritis due to other bacteria, left knee: Secondary | ICD-10-CM | POA: Diagnosis not present

## 2020-01-15 DIAGNOSIS — I2609 Other pulmonary embolism with acute cor pulmonale: Secondary | ICD-10-CM

## 2020-01-15 DIAGNOSIS — R55 Syncope and collapse: Secondary | ICD-10-CM | POA: Diagnosis not present

## 2020-01-15 DIAGNOSIS — I2699 Other pulmonary embolism without acute cor pulmonale: Secondary | ICD-10-CM

## 2020-01-15 DIAGNOSIS — R079 Chest pain, unspecified: Secondary | ICD-10-CM | POA: Diagnosis not present

## 2020-01-15 DIAGNOSIS — D500 Iron deficiency anemia secondary to blood loss (chronic): Secondary | ICD-10-CM | POA: Diagnosis not present

## 2020-01-15 DIAGNOSIS — R778 Other specified abnormalities of plasma proteins: Secondary | ICD-10-CM | POA: Diagnosis not present

## 2020-01-15 DIAGNOSIS — Z89611 Acquired absence of right leg above knee: Secondary | ICD-10-CM | POA: Diagnosis not present

## 2020-01-15 DIAGNOSIS — I10 Essential (primary) hypertension: Secondary | ICD-10-CM | POA: Diagnosis not present

## 2020-01-15 LAB — TYPE AND SCREEN
ABO/RH(D): O NEG
Antibody Screen: NEGATIVE
Unit division: 0
Unit division: 0

## 2020-01-15 LAB — ECHOCARDIOGRAM LIMITED
Height: 70 in
Weight: 2800.72 oz

## 2020-01-15 LAB — BASIC METABOLIC PANEL
Anion gap: 9 (ref 5–15)
BUN: 17 mg/dL (ref 6–20)
CO2: 27 mmol/L (ref 22–32)
Calcium: 8.3 mg/dL — ABNORMAL LOW (ref 8.9–10.3)
Chloride: 103 mmol/L (ref 98–111)
Creatinine, Ser: 0.83 mg/dL (ref 0.61–1.24)
GFR, Estimated: >60 mL/min (ref >60)
Glucose, Bld: 196 mg/dL — ABNORMAL HIGH (ref 70–99)
Potassium: 4.1 mmol/L (ref 3.5–5.1)
Sodium: 139 mmol/L (ref 135–145)

## 2020-01-15 LAB — CBC
HCT: 26 % — ABNORMAL LOW (ref 39.0–52.0)
Hemoglobin: 7.9 g/dL — ABNORMAL LOW (ref 13.0–17.0)
MCH: 24.5 pg — ABNORMAL LOW (ref 26.0–34.0)
MCHC: 30.4 g/dL (ref 30.0–36.0)
MCV: 80.5 fL (ref 80.0–100.0)
Platelets: 391 10*3/uL (ref 150–400)
RBC: 3.23 MIL/uL — ABNORMAL LOW (ref 4.22–5.81)
RDW: 17.5 % — ABNORMAL HIGH (ref 11.5–15.5)
WBC: 7.7 10*3/uL (ref 4.0–10.5)
nRBC: 0 % (ref 0.0–0.2)

## 2020-01-15 LAB — AEROBIC/ANAEROBIC CULTURE W GRAM STAIN (SURGICAL/DEEP WOUND)

## 2020-01-15 LAB — BPAM RBC
Blood Product Expiration Date: 202111032359
Blood Product Expiration Date: 202111032359
ISSUE DATE / TIME: 202110292129
ISSUE DATE / TIME: 202110300103
Unit Type and Rh: 9500
Unit Type and Rh: 9500

## 2020-01-15 LAB — TROPONIN I (HIGH SENSITIVITY)
Troponin I (High Sensitivity): 65 ng/L — ABNORMAL HIGH (ref <18)
Troponin I (High Sensitivity): 56 ng/L — ABNORMAL HIGH (ref <18)

## 2020-01-15 MED ORDER — HEPARIN (PORCINE) 25000 UT/250ML-% IV SOLN
1550.0000 [IU]/h | INTRAVENOUS | Status: AC
  Administered 2020-01-15: 1350 [IU]/h via INTRAVENOUS
  Administered 2020-01-16: 1200 [IU]/h via INTRAVENOUS
  Administered 2020-01-17: 1300 [IU]/h via INTRAVENOUS
  Administered 2020-01-18: 1550 [IU]/h via INTRAVENOUS
  Filled 2020-01-15 (×4): qty 250

## 2020-01-15 MED ORDER — LIDOCAINE HCL (PF) 1 % IJ SOLN
INTRAMUSCULAR | Status: AC
  Filled 2020-01-15: qty 30

## 2020-01-15 NOTE — Plan of Care (Signed)
°  Problem: Education: Goal: Knowledge of General Education information will improve Description: Including pain rating scale, medication(s)/side effects and non-pharmacologic comfort measures Outcome: Progressing   Problem: Clinical Measurements: Goal: Ability to maintain clinical measurements within normal limits will improve Outcome: Progressing Goal: Will remain free from infection Outcome: Progressing Goal: Diagnostic test results will improve Outcome: Progressing Goal: Cardiovascular complication will be avoided Outcome: Progressing   Problem: Pain Managment: Goal: General experience of comfort will improve Outcome: Progressing   Problem: Safety: Goal: Ability to remain free from injury will improve Outcome: Progressing

## 2020-01-15 NOTE — Progress Notes (Signed)
*  PRELIMINARY RESULTS* Echocardiogram Limited 2-D Echocardiogram has been performed.  Samuel Germany 01/15/2020, 4:43 PM

## 2020-01-15 NOTE — Progress Notes (Signed)
[place] for Lovenox Indication: pulmonary embolus  Allergies  Allergen Reactions  . Ivp Dye [Iodinated Diagnostic Agents] Anaphylaxis    Can be pre-treated with Benadryl  . Metrizamide Anaphylaxis  . Other Other (See Comments)    Under no circumstances will the patient agree to a PICC line  . Reglan [Metoclopramide] Anaphylaxis  . Shellfish-Derived Products Anaphylaxis  . Codeine Hives  . Lidocaine Other (See Comments) and Rash    Pt not sure if this is an actual allergy - might have been a one time incident  . Methadone Hives  . Morphine Swelling and Rash    Local reaction to IV being pushed too fast  . Propoxyphene Hives  . Toradol [Ketorolac Tromethamine] Itching    Patient having systemic itching after receiving IV dose  . Iodine Rash    Reports localized reaction at IV site. Able to tolerate with Benadryl.   . Tape Rash  . Vancomycin Rash    Has had vancomycin since this reaction and had no reaction at all    Patient Measurements: Height: 5\' 10"  (177.8 cm) Weight: 79.4 kg (175 lb 0.7 oz) IBW/kg (Calculated) : 73 Heparin Dosing Weight: 79.4 kg  Vital Signs: Temp: 98.2 F (36.8 C) (10/31 0716) Temp Source: Oral (10/31 0716) BP: 138/77 (10/31 0500) Pulse Rate: 90 (10/31 0500)  Labs: Recent Labs    01/12/20 1504 01/12/20 1504 01/13/20 0308 01/13/20 1845 01/14/20 0543 01/14/20 0543 01/14/20 0956 01/14/20 2300 01/15/20 0100 01/15/20 0300 01/15/20 0410  HGB 8.0*   < > 7.0*   < > 9.0*   < >  --  7.9*  --   --  7.9*  HCT 27.0*   < > 23.3*   < > 28.9*  --   --  25.4*  --   --  26.0*  PLT 508*   < > 422*   < > 403*  --   --  378  --   --  391  HEPARINUNFRC  --   --   --   --  <0.10*  --  0.12*  --   --   --   --   CREATININE 1.05   < > 0.84  --  0.94  --   --   --   --  0.83  --   TROPONINIHS 79*  --   --   --   --   --   --   --  65* 56*  --    < > = values in this interval not displayed.    Estimated Creatinine  Clearance: 108.7 mL/min (by C-G formula based on SCr of 0.83 mg/dL).   Medical History: Past Medical History:  Diagnosis Date  . Allergy   . Anxiety   . Arthritis    left shoulder  . Bronchitis   . Chest pain 07/14/2016  . Complication of anesthesia   . DJD (degenerative joint disease) 11/29/2011  . Hypertension   . Neuromuscular disorder ([place])    carpal tunnel bilateral, ulner nerve surgery  . PONV (postoperative nausea and vomiting)   . Septic arthritis of knee, right ([place]) 11/29/2011  . Spinal headache    "long time ago"   Assessment: 54 yr old male POD#2 I&D L knee and L anterior thigh abscess. Pharmacy consulted 10/30 to switch IV heparin to Lovenox for possible pulmonary embolism due to difficult IV access, need for central line placement and CTA.   10/31: CTA showed bilateral  non-occlusive PE. Pharmacy now consulted to switch back to heparin with plans to return to OR for repeat I&D. Pt is s/p 2 u PRBCs on 10/29, Hgb improved from 6.5 to 9.0, and now 7.9. Platelets wnl. Will start heparin without bolus 6 hours before next Lovenox dose would have been due.   Goal of Therapy:   Heparin level 0.3-0.7 units/mL Monitor platelets by anticoagulation protocol: Yes   Plan:  Discontinue Lovenox Start heparin at 1350 units/hr at 1930 6 hour heparin level Daily CBC, HL Monitor s/sx bleeding  Rebbeca Paul, PharmD PGY1 Pharmacy Resident 01/15/2020 1:35 PM  Please check [email or web address] for unit-specific pharmacy phone numbers.

## 2020-01-15 NOTE — Procedures (Addendum)
Interventional Radiology Procedure Note  Procedure:  US guided right brachial vein venipuncture. Indication is IV requirement for CTA chest.   Complications: None  Recommendations:  - Ok to use - Wound recommend removal after the CTA chest, as these have low durability.  - Routine IV care   Signed,  Dulcy Fanny. Earleen Newport, DO

## 2020-01-15 NOTE — Consult Note (Signed)
NAME:  Jason [name], MRN:  [id number], DOB:  1965/09/26, LOS: 4 ADMISSION DATE:  01/11/2020, CONSULTATION DATE:  10/31 REFERRING MD:  Elder Love, CHIEF COMPLAINT:  Pulmonary emboli    Brief History   54 year old male admitted initially admitted after syncopal episode w/ trop. All in context of infected left knee. Seen by cards who recommended CT chest. Went to OR for I&D on 10/28. CT chest and ECHO completed 10/29 w/ severe RV dilation and reduced RV fxn. 10/30 CT chest w/ bilateral mult PE. Pulm asked to see 10/31 given surgical risk concern as he was planned to go back to OR on 10/2 or 10/3  History of present illness    54 year old male admitted 10/27 w/ left knee septic arthritis. Had prior I&D at [facility] 10/3. 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 Presented to [facility] after syncopal event. Refused xfer to [employer]. Ortho was consulted for evaluation of left knee drainage. On 10/28 seen by ortho w/ plan to proceed w/ I&D and abx bead placement.  Cards seen 10/28: felt trop elevation not cardiac a CT of chest was recommended 10/28 underwent I&D of left left w/ abx cement  10/29 seen by ID. 1. The RV is severely dilated with moderately reduced function. Would consider acute PE to explain tachycardia, syncope, and elevated troponin.2. Left ventricular ejection fraction, by estimation, is 60 to 65%. The left ventricle has normal function. The left ventricle has no regional wall motion abnormalities. Left ventricular diastolic parameters were normal. There is the interventricular septum is flattened in systole, consistent with right ventricular pressure overload. 3. Right ventricular systolic function is moderately reduced. The right ventricular size is severely enlarged. CT chest: . Bilateral pulmonary emboli without right heart strain as detailed above.2. No other acute abnormality.   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   Significant Hospital Events   10/28: seen by cards, ortho and underwent I&D of left left w/ abx cement  10/29 seen by ID. 1. The RV is severely dilated with moderately reduced function. Would consider acute PE to explain tachycardia, syncope, and elevated troponin.2. Left ventricular ejection fraction, by estimation, is 60 to 65%. The left ventricle has normal function. The left ventricle has no regional wall motion abnormalities. Left ventricular diastolic parameters were normal. There is the interventricular septum is flattened in systole, consistent with right ventricular pressure overload. 3. Right ventricular systolic function is moderately reduced. The right ventricular size is severely enlarged. CT chest: . Bilateral pulmonary emboli without right heart strain as detailed above.2. No other acute abnormality.   Consults:  Ortho ID  Cards Critical care   Procedures:    Significant Diagnostic Tests:   ECHO 10/29: The RV is severely dilated with moderately reduced function. Would consider acute PE to explain tachycardia, syncope, and elevated troponin.2. Left ventricular ejection fraction, by estimation, is 60 to 65%. The left ventricle has normal function. The left ventricle has no regional wall motion abnormalities. Left ventricular diastolic parameters were normal. There is the interventricular septum is flattened in systole, consistent with right ventricular pressure overload. 3. Right ventricular systolic function is moderately reduced. The right ventricular size is severely enlarged. CT chest: 10/30: Bilateral pulmonary emboli without right heart strain as detailed above.2. No other acute abnormality.  Micro Data:   10/27: resp viral panel: negative 10/28: BCX2: >>> 10/28: surgical wound PUSS: few  beta lactamase + prevotella denticola  Antimicrobials:  unasyn 10/29 >>> Rocephin/tobra/vanc 10/28 Interim history/subjective:  C/o  chest discomfort w/ deep breath   Objective   Blood pressure 138/77, pulse 90, temperature 98.2 F (36.8 C), temperature source Oral, resp. rate 18, height 5\' 10"  (1.778 m), weight 79.4 kg, SpO2 95 %.        Intake/Output Summary (Last 24 hours) at 01/15/2020 1204 Last data filed at 01/15/2020 0300 Gross per 24 hour  Intake 499.58 ml  Output no documentation  Net 499.58 ml   Filed Weights   01/11/20 1749 01/12/20 0600 01/14/20 0326  Weight: 86.6 kg 81.7 kg 79.4 kg    Examination: General: this is 54 year old male resting in bed HENT: ncat no jvd, right IJ CVL in good position Lungs: clear and dec in bases Cardiovascular: RRR Abdomen: soft not tender  Extremities: left LE wrapped in ace-I Neuro: awake and alert  GU: voids   Resolved Hospital Problem list     Assessment & Plan:   Septic left knee joint Bilateral Pulmonary emboli Acute Cor Pulmonale Syncopal event  Anemia   Septic Left knee joint, now s/p I&D on 10/28 w/ plan for repeat trips to OR, now complicated by bilateral submassive Acute Pulmonary Emboli w/ evidence of right heart strain and dysfxn. His Pesi score was 61 (low risk) and as this PE was probably present on admission should be able to still go ahead w/ repeated trips to OR stopping Heparin intermittently, but would be ideal to be sure that ECHO not worse.   Plan Day 3 unasyn  Change LMWH to IV heparin Repeat ECHO, need to see if RV fxn same or not. If same or worse may be reasonable to have IR look at CT scan and see if he is thrombectomy candidate Or cath directed TPA candidate but given recent ortho surg may be risky   Best practice:  Per primary   Labs   CBC: Recent Labs  Lab 01/11/20 1816 01/11/20 1816 01/12/20 0540 01/12/20 1504 01/13/20 0308 01/13/20 1845 01/14/20 0543 01/14/20 2300 01/15/20 0410  WBC 12.0*   < > 9.7   < > 11.2* 9.4 13.7* 7.7 7.7  NEUTROABS 9.9*  --  7.4  --   --   --   --   --   --   HGB 9.0*   < > 8.0*   < >  7.0* 6.5* 9.0* 7.9* 7.9*  HCT 31.7*   < > 26.7*   < > 23.3* 21.9* 28.9* 25.4* 26.0*  MCV 83.4   < > 79.7*   < > 78.5* 80.2 81.0 79.1* 80.5  PLT 497*   < > 493*   < > 422* 295 403* 378 391   < > = values in this interval not displayed.    Basic Metabolic Panel: Recent Labs  Lab 01/11/20 1816 01/11/20 1816 01/12/20 0540 01/12/20 1504 01/13/20 0308 01/14/20 0543 01/15/20 0300  NA 137  --  140  --  138 139 139  K 3.7  --  3.6  --  4.9 4.6 4.1  CL 100  --  107  --  105 104 103  CO2 21*  --  23  --  24 26 27   GLUCOSE 81  --  87  --  207* 180* 196*  BUN 20  --  15  --  15 14 17   CREATININE 1.09   < > 0.84 1.05 0.84 0.94 0.83  CALCIUM 9.3  --  8.9  --  8.4* 8.6* 8.3*  MG  --   --  1.9  --   --   --   --   PHOS  --   --  3.8  --   --   --   --    < > = values in this interval not displayed.   GFR: Estimated Creatinine Clearance: 108.7 mL/min (by C-G formula based on SCr of 0.83 mg/dL). Recent Labs  Lab 01/12/20 0540 01/12/20 1504 01/13/20 1845 01/14/20 0543 01/14/20 2300 01/15/20 0410  WBC 9.7   < > 9.4 13.7* 7.7 7.7  LATICACIDVEN 0.6  --   --   --   --   --    < > = values in this interval not displayed.    Liver Function Tests: Recent Labs  Lab 01/11/20 1816 01/12/20 0540  AST 18 16  ALT 18 16  ALKPHOS 102 84  BILITOT 1.5* 1.2  PROT 8.1 7.5  ALBUMIN 2.8* 2.5*   No results for input(s): LIPASE, AMYLASE in the last 168 hours. No results for input(s): AMMONIA in the last 168 hours.  ABG    Component Value Date/Time   TCO2 28 03/03/2018 1722     Coagulation Profile: Recent Labs  Lab 01/12/20 0540  INR 1.2    Cardiac Enzymes: Recent Labs  Lab 01/12/20 0540  CKTOTAL 33*    HbA1C: No results found for: HGBA1C  CBG: Recent Labs  Lab 01/12/20 0118 01/12/20 0212 01/12/20 0829 01/12/20 2219 01/13/20 2056  GLUCAP 74 79 81 190* 109*    Review of Systems:   Review of Systems  Constitutional: Positive for malaise/fatigue.  HENT: Negative.     Respiratory: Positive for shortness of breath.   Cardiovascular: Positive for chest pain.  Gastrointestinal: Negative.   Genitourinary: Negative.   Musculoskeletal: Positive for joint pain.  Skin: Negative.   Neurological: Negative.   Endo/Heme/Allergies: Negative.   Psychiatric/Behavioral: Negative.      Past Medical History  He,  has a past medical history of Allergy, Anxiety, Arthritis, Bronchitis, Chest pain (04/28/9415), Complication of anesthesia, DJD (degenerative joint disease) (11/29/2011), Hypertension, Neuromuscular disorder ([place]), PONV (postoperative nausea and vomiting), Septic arthritis of knee, right ([place]) (11/29/2011), and Spinal headache.   Surgical History    Past Surgical History:  Procedure Laterality Date  . 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 APPENDECTOMY    . CHOLECYSTECTOMY    . I & D EXTREMITY Left 01/12/2020   Procedure: IRRIGATION AND DEBRIDEMENT EXTREMITY;  Surgeon: Shona Needles, MD;  Location: [place];  Service: Orthopedics;  Laterality: Left;  . IRRIGATION AND DEBRIDEMENT KNEE  11/26/2011   Procedure: IRRIGATION AND DEBRIDEMENT KNEE;  Surgeon: Kerin Salen, MD;  Location: [place];  Service: Orthopedics;  Laterality: Right;  . KNEE ARTHROSCOPY  10/24/2011   Procedure: ARTHROSCOPY KNEE;  Surgeon: Kerin Salen, MD;  Location: [place];  Service: Orthopedics;  Laterality: Right;  . KNEE SURGERY     7 knee surgeries on right, and 4 knee surgeries left  . LEFT HEART CATH AND CORONARY ANGIOGRAPHY N/A 07/14/2016   Procedure: Left Heart Cath and Coronary Angiography;  Surgeon: Belva Crome, MD;  Location: [place] [facility];  Service: Cardiovascular;  Laterality: N/A;  . SHOULDER ARTHROSCOPY  07/29/2011   Procedure: ARTHROSCOPY SHOULDER;  Surgeon: Mcarthur Rossetti, MD;  Location: [place];  Service: Orthopedics;  Laterality: Left;  Left shoulder arthroscopy with minimal debridement, left wrist steroid injection  .  SHOULDER SURGERY     3 surgeries on  right, 2 surgeries on left  . ULNAR NERVE REPAIR       Social History   reports that he has never smoked. He has never used smokeless tobacco. He reports that he does not drink alcohol and does not use drugs.   Family History   His family history includes Arrhythmia in his sister; COPD in his father; Diabetes in his father; Drug abuse in his brother; Heart attack in his father and sister; Hypertension in his father and mother.   Allergies Allergies  Allergen Reactions  . Ivp Dye [Iodinated Diagnostic Agents] Anaphylaxis    Can be pre-treated with Benadryl  . Metrizamide Anaphylaxis  . Other Other (See Comments)    Under no circumstances will the patient agree to a PICC line  . Reglan [Metoclopramide] Anaphylaxis  . Shellfish-Derived Products Anaphylaxis  . Codeine Hives  . Lidocaine Other (See Comments) and Rash    Pt not sure if this is an actual allergy - might have been a one time incident  . Methadone Hives  . Morphine Swelling and Rash    Local reaction to IV being pushed too fast  . Propoxyphene Hives  . Toradol [Ketorolac Tromethamine] Itching    Patient having systemic itching after receiving IV dose  . Iodine Rash    Reports localized reaction at IV site. Able to tolerate with Benadryl.   . Tape Rash  . Vancomycin Rash    Has had vancomycin since this reaction and had no reaction at all     Home Medications  Prior to Admission medications   Medication Sig Start Date End Date Taking? Authorizing Provider  ALPRAZolam (XANAX) 1 MG tablet TAKE (1) TABLET BY MOUTH THREE TIMES DAILY AS NEEDED FOR ANXIETY OR AGITATION. Patient taking differently: Take 1 mg by mouth 3 (three) times daily as needed for anxiety.  03/22/18  Yes Claretta Fraise, MD  EPINEPHrine 0.3 mg/0.3 mL IJ SOAJ injection Inject 0.3 mg into the muscle once as needed for anaphylaxis. 05/28/16  Yes [provider]  ondansetron (ZOFRAN-ODT) 4 MG disintegrating tablet DISSOLVE (1) TABLET BY MOUTH EVERY 8  HOURS AS NEEDED FOR UP TO 7 DAYS. Patient taking differently: Take 4 mg by mouth every 8 (eight) hours as needed for nausea or vomiting.  12/25/17  Yes Claretta Fraise, MD  zolpidem (AMBIEN CR) 12.5 MG CR tablet Take 2 tablets (25 mg total) by mouth at bedtime. Take 1 by mouth QHS for early awakening. Patient taking differently: Take 25 mg by mouth at bedtime.  03/22/18  Yes Claretta Fraise, MD  oxyCODONE (ROXICODONE) 15 MG immediate release tablet Take 1 tablet (15 mg total) by mouth every 6 (six) hours as needed for pain. Patient not taking: Reported on 01/11/2020 05/06/17   Claretta Fraise, MD  tobramycin-dexamethasone [facility]) ophthalmic solution Apply 1 drop in affected eye(s) every 2 hours for two days. Then every 4 hours for 5 days. Patient not taking: Reported on 01/11/2020 03/29/18   Claretta Fraise, MD     Critical care time: NA    Erick Colace ACNP-BC [place] Pager # [phone] OR # [phone] if no answer

## 2020-01-15 NOTE — Progress Notes (Signed)
PROGRESS NOTE    Jason [name]  NOB:[id number] DOB: 1965-04-11 DOA: 01/11/2020 PCP: Patient, No Pcp Per   Brief Narrative: Jason [name] is a 54 y.o. male with medical history significant of  Sp AKA, septir arthritis of left knee, h/o opioid dependence,documented Munchausen syndrome. Patient presented secondary to LOC, chest pain and knee pain. Syncope workup in addition to management of septic arthritis.   Assessment & Plan:   Active Problems:   Essential hypertension   S/P AKA (above knee amputation) unilateral, right (HCC)   Other chronic pain   Septic arthritis (HCC)   Syncope and collapse   Elevated troponin   Chest pain   Bright red blood per rectum   Anemia   Encounter for central line placement   Chest pain Present on admission and has improved. Cardiology consulted. HS troponin elevated: 77>93>68. Elevated D-dimer of 9.72 and Transthoracic Echocardiogram significant for RV dilation with concern for possible PE. No chest pain currently. On room air. Negative venous duplex for DVT. -Telemetry -Cardiology recommendations: metoprolol started  Bilateral PEs Non-occlusive. Left side includes distal main artery with segmental clots in upper/lower lobe in addition to segmental clots on right in upper/lower lobe. No heart strain per CT but Transthoracic Echocardiogram with RV dilation. -Continue Lovenox -Pulmonology consult for risk stratification  RV dilation Bilateral, non-occlusive PEs on CTA chest. LE venous duplexes obtained and are negative. Started empiric heparin drip without bolus secondary to significant anemia which was transitioned to Lovenox secondary to IV access.  Syncope and collapse Story sounds vasovagal however concern for possible PE.  -Management above  Septic arthritis Recurring issue. Per chart review, patient was previously managed at [facility] with concern patient may have Munchausen and is possibly inoculating himself. Previous  cultures (10/3) from OSH significant for significant for strep mitis/oralis/anginosus/parasnguinis with blood culture significant for strep anginosus/strepp salivarius/gemella haemolysans (as mentioned in H&P) with susceptibilities to penicillin. Per patient he received two arthrocenteses while at [facility] surgery consulted and patient underwent I&D on 10/28 in the OR with vancomycin/tobramycin cement beads placed. ID consulted by orthopedic surgery on 10/28. Started on Unasyn per ID -ID recommendations: Unasyn IV -Orthopedic surgery recommendations: pain management and plans for re-exploration  Bright red blood per rectum FOBT negative x1. Hemoglobin tended down slightly from admission but still baseline -FOBT x2 more  Acute on chronic Anemia Unknown etiology. Baseline appears to be between 8-9. Worsened hemoglobin with a low of 6.5 on 10/29. Ordered 2 units of PRBC but he received 1.5 secondary to IV infiltration of the second unit. Resultant rebound hemoglobin of 9.0 which has stabilized to about 7.9.    DVT prophylaxis: SCDs Code Status:   Code Status: Full Code Family Communication: None at bedside Disposition Plan: Discharge home pending orthopedic surgery management and ID recommendations for antibiotics in addition to workup for possible PE   Consultants:   Orthopedic surgery  Cardiology  Procedures:   INCISION & DRAINAGE (10/28) 1. Incision and drainage of left septic knee 2. Incision and drainage of left anterior thigh abscess 3. Placement of antibiotic cement  4. Incisional wound vac placement   TRANSTHORACIC ECHOCARDIOGRAM (10/29) IMPRESSIONS    1. The RV is severely dilated with moderately reduced function. Would  consider acute PE to explain tachycardia, syncope, and elevated troponin.  2. Left ventricular ejection fraction, by estimation, is 60 to 65%. The  left ventricle has normal function. The left ventricle has no regional  wall motion  abnormalities. Left ventricular  diastolic parameters were  normal. There is the interventricular  septum is flattened in systole, consistent with right ventricular pressure  overload.  3. Right ventricular systolic function is moderately reduced. The right  ventricular size is severely enlarged.  4. The mitral valve is grossly normal. Trivial mitral valve  regurgitation. No evidence of mitral stenosis.  5. The aortic valve is tricuspid. Aortic valve regurgitation is not  visualized. No aortic stenosis is present.   Conclusion(s)/Recommendation(s): Findings consistent with Cor Pulmonale.  Antimicrobials:  Unasyn   Subjective: Patient now reports he has pain with deep inspiration and shortness of breath.  Objective: Vitals:   01/14/20 2300 01/15/20 0157 01/15/20 0500 01/15/20 0716  BP: 136/87 134/81 138/77   Pulse: 97 (!) 101 90   Resp:  18 18 18   Temp:   97.8 F (36.6 C) 98.2 F (36.8 C)  TempSrc:   Oral Oral  SpO2: 94% 96% 95%   Weight:      Height:        Intake/Output Summary (Last 24 hours) at 01/15/2020 1046 Last data filed at 01/15/2020 0300 Gross per 24 hour  Intake 499.58 ml  Output --  Net 499.58 ml   Filed Weights   01/11/20 1749 01/12/20 0600 01/14/20 0326  Weight: 86.6 kg 81.7 kg 79.4 kg    Examination:  General exam: Appears calm and comfortable Respiratory system: Clear to auscultation. Respiratory effort normal. Cardiovascular system: S1 & S2 heard, RRR. No murmurs, rubs, gallops or clicks. Gastrointestinal system: Abdomen is nondistended, soft and nontender. No organomegaly or masses felt. Normal bowel sounds heard. Central nervous system: Alert but easily falls asleep. Oriented. No focal neurological deficits. Musculoskeletal: Left leg in ACE bandages. No calf tenderness Skin: No cyanosis. No rashes Psychiatry: Judgement and insight appear normal. Mood & affect appropriate.     Data Reviewed: I have personally reviewed following labs and  imaging studies  CBC Lab Results  Component Value Date   WBC 7.7 01/15/2020   RBC 3.23 (L) 01/15/2020   HGB 7.9 (L) 01/15/2020   HCT 26.0 (L) 01/15/2020   MCV 80.5 01/15/2020   MCH 24.5 (L) 01/15/2020   PLT 391 01/15/2020   MCHC 30.4 01/15/2020   RDW 17.5 (H) 01/15/2020   LYMPHSABS 1.1 01/12/2020   MONOABS 0.8 01/12/2020   EOSABS 0.2 01/12/2020   BASOSABS 0.0 74/25/9563     Last metabolic panel Lab Results  Component Value Date   NA 139 01/15/2020   K 4.1 01/15/2020   CL 103 01/15/2020   CO2 27 01/15/2020   BUN 17 01/15/2020   CREATININE 0.83 01/15/2020   GLUCOSE 196 (H) 01/15/2020   GFRNONAA >60 01/15/2020   GFRAA >60 08/18/2018   CALCIUM 8.3 (L) 01/15/2020   PHOS 3.8 01/12/2020   PROT 7.5 01/12/2020   ALBUMIN 2.5 (L) 01/12/2020   LABGLOB 2.6 08/14/2017   AGRATIO 1.9 08/14/2017   BILITOT 1.2 01/12/2020   ALKPHOS 84 01/12/2020   AST 16 01/12/2020   ALT 16 01/12/2020   ANIONGAP 9 01/15/2020    CBG (last 3)  Recent Labs    01/12/20 2219 01/13/20 2056  GLUCAP 190* 109*     GFR: Estimated Creatinine Clearance: 108.7 mL/min (by C-G formula based on SCr of 0.83 mg/dL).  Coagulation Profile: Recent Labs  Lab 01/12/20 0540  INR 1.2    Recent Results (from the past 240 hour(s))  Respiratory Panel by RT PCR (Flu A&B, Covid) - Nasopharyngeal Swab     Status: None  Collection Time: 01/11/20  8:14 PM   Specimen: Nasopharyngeal Swab  Result Value Ref Range Status   SARS Coronavirus 2 by RT PCR NEGATIVE NEGATIVE Final    Comment: (NOTE) SARS-CoV-2 target nucleic acids are NOT DETECTED.  The SARS-CoV-2 RNA is generally detectable in upper respiratoy specimens during the acute phase of infection. The lowest concentration of SARS-CoV-2 viral copies this assay can detect is 131 copies/mL. A negative result does not preclude SARS-Cov-2 infection and should not be used as the sole basis for treatment or other patient management decisions. A negative result  may occur with  improper specimen collection/handling, submission of specimen other than nasopharyngeal swab, presence of viral mutation(s) within the areas targeted by this assay, and inadequate number of viral copies (<131 copies/mL). A negative result must be combined with clinical observations, patient history, and epidemiological information. The expected result is Negative.  Fact Sheet for Patients:  PinkCheek.be  Fact Sheet for Healthcare Providers:  GravelBags.it  This test is no t yet approved or cleared by the [place] FDA and  has been authorized for detection and/or diagnosis of SARS-CoV-2 by FDA under an Emergency Use Authorization (EUA). This EUA will remain  in effect (meaning this test can be used) for the duration of the COVID-19 declaration under Section 564(b)(1) of the Act, 21 U.S.C. section 360bbb-3(b)(1), unless the authorization is terminated or revoked sooner.     Influenza A by PCR NEGATIVE NEGATIVE Final   Influenza B by PCR NEGATIVE NEGATIVE Final    Comment: (NOTE) The Xpert Xpress SARS-CoV-2/FLU/RSV assay is intended as an aid in  the diagnosis of influenza from Nasopharyngeal swab specimens and  should not be used as a sole basis for treatment. Nasal washings and  aspirates are unacceptable for Xpert Xpress SARS-CoV-2/FLU/RSV  testing.  Fact Sheet for Patients: PinkCheek.be  Fact Sheet for Healthcare Providers: GravelBags.it  This test is not yet approved or cleared by the [place] FDA and  has been authorized for detection and/or diagnosis of SARS-CoV-2 by  FDA under an Emergency Use Authorization (EUA). This EUA will remain  in effect (meaning this test can be used) for the duration of the  Covid-19 declaration under Section 564(b)(1) of the Act, 21  U.S.C. section 360bbb-3(b)(1), unless the authorization is  terminated  or revoked. Performed at [facility], [place] [address], [place], [place] [address]   Culture, blood (Routine X 2) w Reflex to ID Panel     Status: None (Preliminary result)   Collection Time: 01/12/20  5:39 AM   Specimen: BLOOD LEFT HAND  Result Value Ref Range Status   Specimen Description BLOOD LEFT HAND  Final   Special Requests   Final    BOTTLES DRAWN AEROBIC AND ANAEROBIC Blood Culture adequate volume   Culture   Final    NO GROWTH 3 DAYS Performed at [facility], [place] [address] [place], [place] [address]    Report Status PENDING  Incomplete  Surgical pcr screen     Status: None   Collection Time: 01/12/20 10:31 AM   Specimen: Nasal Mucosa; Nasal Swab  Result Value Ref Range Status   MRSA, PCR NEGATIVE NEGATIVE Final   Staphylococcus aureus NEGATIVE NEGATIVE Final    Comment: (NOTE) The Xpert SA Assay (FDA approved for NASAL specimens in patients 21 years of age and older), is one component of a comprehensive surveillance program. It is not intended to diagnose infection nor to guide or monitor treatment. Performed at  [facility], [place] [address], [place], [place] [address]   Aerobic/Anaerobic Culture (surgical/deep wound)     Status: None (Preliminary result)   Collection Time: 01/12/20 12:31 PM   Specimen: PATH Other; Tissue  Result Value Ref Range Status   Specimen Description TISSUE  Final   Special Requests LEFT KNEE PUSS SPEC A  Final   Gram Stain   Final    MODERATE WBC PRESENT, PREDOMINANTLY PMN FEW GRAM NEGATIVE RODS    Culture   Final    CULTURE REINCUBATED FOR BETTER GROWTH Performed at [facility], [place] [address] [place], [place] [address]    Report Status PENDING  Incomplete  Culture, blood (Routine X 2) w Reflex to ID Panel     Status: None (Preliminary result)   Collection Time: 01/12/20  3:04 PM   Specimen: BLOOD  Result Value Ref Range Status   Specimen Description BLOOD LEFT ANTECUBITAL  Final   Special Requests    Final    BOTTLES DRAWN AEROBIC AND ANAEROBIC Blood Culture adequate volume   Culture   Final    NO GROWTH 3 DAYS Performed at [facility], [place] [address], [place],  [address]    Report Status PENDING  Incomplete        Radiology Studies: DG Chest Port 1 View  Result Date: 01/14/2020 CLINICAL DATA:  Central line placement. EXAM: PORTABLE CHEST 1 VIEW COMPARISON:  January 11, 2020 FINDINGS: Right internal jugular approach central venous catheter terminates at the expected location of the cavoatrial junction. Cardiomediastinal silhouette is normal. Mediastinal contours appear intact. There is no evidence of focal airspace consolidation, pleural effusion or pneumothorax. Osseous structures are without acute abnormality. Soft tissues are grossly normal. IMPRESSION: Right internal jugular approach central venous catheter terminates at the expected location of the cavoatrial junction. Electronically Signed   By: Fidela Salisbury M.D.   On: 01/14/2020 19:02   ECHOCARDIOGRAM COMPLETE  Result Date: 01/13/2020    ECHOCARDIOGRAM REPORT   Patient Name:   Jason [name] Date of Exam: 01/13/2020 Medical Rec #:  [id number]      Height:       70.0 in Accession #:    [id number]     Weight:       180.1 lb Date of Birth:  [birth year]      BSA:          1.996 m Patient Age:    80 years       BP:           130/81 mmHg Patient Gender: M              HR:           100 bpm. Exam Location:  Inpatient Procedure: 2D Echo, Cardiac Doppler and Color Doppler Indications:    Elevated troponin  History:        Patient has prior history of Echocardiogram examinations, most                 recent 05-24-67. Signs/Symptoms:Syncope and Chest Pain; Risk                 Factors:Hypertension.  Sonographer:    Clayton Lefort RDCS (AE) Referring Phys: [id number] ANASTASSIA DOUTOVA  Sonographer Comments: No subcostal window. IMPRESSIONS  1. The RV is severely dilated with moderately reduced function. Would consider acute PE to  explain tachycardia, syncope, and elevated troponin.  2. Left ventricular ejection fraction, by estimation, is 60  to 65%. The left ventricle has normal function. The left ventricle has no regional wall motion abnormalities. Left ventricular diastolic parameters were normal. There is the interventricular septum is flattened in systole, consistent with right ventricular pressure overload.  3. Right ventricular systolic function is moderately reduced. The right ventricular size is severely enlarged.  4. The mitral valve is grossly normal. Trivial mitral valve regurgitation. No evidence of mitral stenosis.  5. The aortic valve is tricuspid. Aortic valve regurgitation is not visualized. No aortic stenosis is present. Conclusion(s)/Recommendation(s): Findings consistent with Cor Pulmonale. FINDINGS  Left Ventricle: Left ventricular ejection fraction, by estimation, is 60 to 65%. The left ventricle has normal function. The left ventricle has no regional wall motion abnormalities. The left ventricular internal cavity size was normal in size. There is  no left ventricular hypertrophy. The interventricular septum is flattened in systole, consistent with right ventricular pressure overload. Left ventricular diastolic parameters were normal. Normal left ventricular filling pressure. Right Ventricle: The right ventricular size is severely enlarged. No increase in right ventricular wall thickness. Right ventricular systolic function is moderately reduced. Left Atrium: Left atrial size was normal in size. Right Atrium: Right atrial size was normal in size. Pericardium: Trivial pericardial effusion is present. Presence of pericardial fat pad. Mitral Valve: The mitral valve is grossly normal. Trivial mitral valve regurgitation. No evidence of mitral valve stenosis. Tricuspid Valve: The tricuspid valve is grossly normal. Tricuspid valve regurgitation is trivial. No evidence of tricuspid stenosis. Aortic Valve: The aortic valve is  tricuspid. Aortic valve regurgitation is not visualized. No aortic stenosis is present. Aortic valve mean gradient measures 4.0 mmHg. Aortic valve peak gradient measures 7.2 mmHg. Aortic valve area, by VTI measures 2.58 cm. Pulmonic Valve: The pulmonic valve was grossly normal. Pulmonic valve regurgitation is not visualized. No evidence of pulmonic stenosis. Aorta: The aortic root is normal in size and structure. Venous: The inferior vena cava was not well visualized. IAS/Shunts: The atrial septum is grossly normal.  LEFT VENTRICLE PLAX 2D LVIDd:         4.90 cm  Diastology LVIDs:         2.80 cm  LV e' medial:    8.81 cm/s LV PW:         1.10 cm  LV E/e' medial:  8.2 LV IVS:        1.10 cm  LV e' lateral:   12.80 cm/s LVOT diam:     2.30 cm  LV E/e' lateral: 5.6 LV SV:         55 LV SV Index:   28 LVOT Area:     4.15 cm  RIGHT VENTRICLE RV Basal diam:  2.80 cm RV S prime:     11.50 cm/s TAPSE (M-mode): 1.8 cm LEFT ATRIUM             Index       RIGHT ATRIUM           Index LA diam:        4.00 cm 2.00 cm/m  RA Area:     17.10 cm LA Vol (A2C):   41.8 ml 20.94 ml/m RA Volume:   41.10 ml  20.59 ml/m LA Vol (A4C):   37.2 ml 18.63 ml/m LA Biplane Vol: 40.5 ml 20.29 ml/m  AORTIC VALVE AV Area (Vmax):    2.98 cm AV Area (Vmean):   2.95 cm AV Area (VTI):     2.58 cm AV Vmax:  134.00 cm/s AV Vmean:          99.000 cm/s AV VTI:            0.214 m AV Peak Grad:      7.2 mmHg AV Mean Grad:      4.0 mmHg LVOT Vmax:         96.00 cm/s LVOT Vmean:        70.200 cm/s LVOT VTI:          0.133 m LVOT/AV VTI ratio: 0.62  AORTA Ao Root diam: 3.40 cm MITRAL VALVE               TRICUSPID VALVE MV Area (PHT): 3.42 cm    TR Peak grad:   26.0 mmHg MV Decel Time: 222 msec    TR Vmax:        255.00 cm/s MV E velocity: 72.00 cm/s MV A velocity: 62.10 cm/s  SHUNTS MV E/A ratio:  1.16        Systemic VTI:  0.13 m                            Systemic Diam: 2.30 cm Jason Chiquito MD Electronically signed by Jason Chiquito MD  Signature Date/Time: 01/13/2020/3:44:25 PM    Final    VAS [place] LOWER EXTREMITY VENOUS (DVT)  Result Date: 01/14/2020  Lower Venous DVTStudy Indications: Suspected PE.  Risk Factors: Surgery 01-12-20 Irrigation and debridement left lower Ext. Limitations: Open wound and Right Above the knee Amputation. Comparison Study: 12-01-2011 Performing Technologist: Griffin Basil RCT RDMS  Examination Guidelines: A complete evaluation includes B-mode imaging, spectral Doppler, color Doppler, and power Doppler as needed of all accessible portions of each vessel. Bilateral testing is considered an integral part of a complete examination. Limited examinations for reoccurring indications may be performed as noted. The reflux portion of the exam is performed with the patient in reverse Trendelenburg.  +---------+---------------+---------+-----------+----------+--------------+ RIGHT    CompressibilityPhasicitySpontaneityPropertiesThrombus Aging +---------+---------------+---------+-----------+----------+--------------+ CFV      Full           Yes      Yes                                 +---------+---------------+---------+-----------+----------+--------------+ SFJ      Full                                                        +---------+---------------+---------+-----------+----------+--------------+ FV Prox  Full                                                        +---------+---------------+---------+-----------+----------+--------------+ FV Mid   Full                                                        +---------+---------------+---------+-----------+----------+--------------+ FV DistalFull                                                        +---------+---------------+---------+-----------+----------+--------------+  Right Above the knee Amputaion.  Right Technical Findings: No DVT seen in the visualized areas.   +-------+---------------+---------+-----------+----------+--------------+ LEFT   CompressibilityPhasicitySpontaneityPropertiesThrombus Aging +-------+---------------+---------+-----------+----------+--------------+ CFV    Full           Yes      Yes                                 +-------+---------------+---------+-----------+----------+--------------+ SFJ    Full                                                        +-------+---------------+---------+-----------+----------+--------------+ FV ProxFull                                                        +-------+---------------+---------+-----------+----------+--------------+ FV Mid Full                                                        +-------+---------------+---------+-----------+----------+--------------+ Left Leg Open wound with Vac and Bandage.  Left Technical Findings: No DVT seen in the visualized areas.   Summary: RIGHT: - There is no evidence of deep vein thrombosis in the lower extremity.  LEFT: - There is no evidence of deep vein thrombosis in the lower extremity.  *See table(s) above for measurements and observations. Electronically signed by Deitra Mayo MD on 01/14/2020 at 8:47:25 AM.    Final    [place] EKG SITE RITE  Result Date: 01/14/2020 If Site Rite image not attached, placement could not be confirmed due to current cardiac rhythm.       Scheduled Meds: . sodium chloride   Intravenous Once  . acetaminophen  1,000 mg Oral Q6H  . Chlorhexidine Gluconate Cloth  6 each Topical Daily  . docusate sodium  100 mg Oral BID  . enoxaparin (LOVENOX) injection  80 mg Subcutaneous Q12H  . gabapentin  300 mg Oral TID  . lidocaine (PF)      . methocarbamol  1,000 mg Oral Q8H   Continuous Infusions: . ampicillin-sulbactam (UNASYN) IV 3 g (01/15/20 0343)  . methocarbamol (ROBAXIN) IV       LOS: 4 days     Cordelia Poche, MD [facility] 01/15/2020, 10:46 AM  If 7PM-7AM, please  contact night-coverage [email or web address]

## 2020-01-15 NOTE — Progress Notes (Signed)
Progress Note  Patient Name: Jason [name] Date of Encounter: 01/15/2020  [facility] Cardiologist: Sanda Klein, MD   Subjective  Down for CTA this morning. Remains on heparin gtt.  Central line placed due to lack of IV access.  Inpatient Medications    Scheduled Meds: . sodium chloride   Intravenous Once  . acetaminophen  1,000 mg Oral Q6H  . Chlorhexidine Gluconate Cloth  6 each Topical Daily  . docusate sodium  100 mg Oral BID  . enoxaparin (LOVENOX) injection  80 mg Subcutaneous Q12H  . gabapentin  300 mg Oral TID  . methocarbamol  1,000 mg Oral Q8H   Continuous Infusions: . ampicillin-sulbactam (UNASYN) IV 3 g (01/15/20 0343)  . methocarbamol (ROBAXIN) IV     PRN Meds: HYDROmorphone (DILAUDID) injection, iohexol, ondansetron **OR** ondansetron (ZOFRAN) IV, oxyCODONE, oxyCODONE, polyethylene glycol, zinc oxide   Vital Signs    Vitals:   01/14/20 2300 01/15/20 0157 01/15/20 0500 01/15/20 0716  BP: 136/87 134/81 138/77   Pulse: 97 (!) 101 90   Resp:  18 18 18   Temp:   97.8 F (36.6 C) 98.2 F (36.8 C)  TempSrc:   Oral Oral  SpO2: 94% 96% 95%   Weight:      Height:        Intake/Output Summary (Last 24 hours) at 01/15/2020 0850 Last data filed at 01/15/2020 0300 Gross per 24 hour  Intake 699.58 ml  Output 600 ml  Net 99.58 ml   Last 3 Weights 01/14/2020 01/12/2020 01/11/2020  Weight (lbs) 175 lb 0.7 oz 180 lb 1.9 oz 191 lb  Weight (kg) 79.4 kg 81.7 kg 86.637 kg      Telemetry    NSR with occasional PVC - Personally Reviewed  ECG    NSR with HR 89 - Personally Reviewed  Physical Exam   GEN: No acute distress.   Neck: Right CVL in place Cardiac: RRR, no mumurs Respiratory: Clear to auscultation bilaterally. GI: Soft, nontender, non-distended  MS: Right AKA, left leg wrapped  Neuro:  Nonfocal  Psych: Normal affect   Labs    High Sensitivity Troponin:   Recent Labs  Lab 01/11/20 2014 01/12/20 0540 01/12/20 1504 01/15/20 0100  01/15/20 0300  TROPONINIHS 93* 68* 79* 65* 56*      Chemistry Recent Labs  Lab 01/11/20 1816 01/11/20 1816 01/12/20 0540 01/12/20 1504 01/13/20 0308 01/14/20 0543 01/15/20 0300  NA 137   < > 140  --  138 139 139  K 3.7   < > 3.6  --  4.9 4.6 4.1  CL 100   < > 107  --  105 104 103  CO2 21*   < > 23  --  24 26 27   GLUCOSE 81   < > 87  --  207* 180* 196*  BUN 20   < > 15  --  15 14 17   CREATININE 1.09   < > 0.84   < > 0.84 0.94 0.83  CALCIUM 9.3   < > 8.9  --  8.4* 8.6* 8.3*  PROT 8.1  --  7.5  --   --   --   --   ALBUMIN 2.8*  --  2.5*  --   --   --   --   AST 18  --  16  --   --   --   --   ALT 18  --  16  --   --   --   --  ALKPHOS 102  --  84  --   --   --   --   BILITOT 1.5*  --  1.2  --   --   --   --   GFRNONAA >60   < > >60   < > >60 >60 >60  ANIONGAP 16*   < > 10  --  9 9 9    < > = values in this interval not displayed.     Hematology Recent Labs  Lab 01/14/20 0543 01/14/20 2300 01/15/20 0410  WBC 13.7* 7.7 7.7  RBC 3.57* 3.21* 3.23*  HGB 9.0* 7.9* 7.9*  HCT 28.9* 25.4* 26.0*  MCV 81.0 79.1* 80.5  MCH 25.2* 24.6* 24.5*  MCHC 31.1 31.1 30.4  RDW 16.9* 17.3* 17.5*  PLT 403* 378 391    BNPNo results for input(s): BNP, PROBNP in the last 168 hours.   DDimer  Recent Labs  Lab 01/12/20 0540  DDIMER 9.72*     Radiology    DG Chest Port 1 View  Result Date: 01/14/2020 CLINICAL DATA:  Central line placement. EXAM: PORTABLE CHEST 1 VIEW COMPARISON:  January 11, 2020 FINDINGS: Right internal jugular approach central venous catheter terminates at the expected location of the cavoatrial junction. Cardiomediastinal silhouette is normal. Mediastinal contours appear intact. There is no evidence of focal airspace consolidation, pleural effusion or pneumothorax. Osseous structures are without acute abnormality. Soft tissues are grossly normal. IMPRESSION: Right internal jugular approach central venous catheter terminates at the expected location of the cavoatrial  junction. Electronically Signed   By: Fidela Salisbury M.D.   On: 01/14/2020 19:02   ECHOCARDIOGRAM COMPLETE  Result Date: 01/13/2020    ECHOCARDIOGRAM REPORT   Patient Name:   Jason [name] Date of Exam: 01/13/2020 Medical Rec #:  [id number]      Height:       70.0 in Accession #:    [id number]     Weight:       180.1 lb Date of Birth:  June 23, 1965      BSA:          1.996 m Patient Age:    54 years       BP:           130/81 mmHg Patient Gender: M              HR:           100 bpm. Exam Location:  Inpatient Procedure: 2D Echo, Cardiac Doppler and Color Doppler Indications:    Elevated troponin  History:        Patient has prior history of Echocardiogram examinations, most                 recent November 16, 1967. Signs/Symptoms:Syncope and Chest Pain; Risk                 Factors:Hypertension.  Sonographer:    Clayton Lefort RDCS (AE) Referring Phys: [id number] ANASTASSIA DOUTOVA  Sonographer Comments: No subcostal window. IMPRESSIONS  1. The RV is severely dilated with moderately reduced function. Would consider acute PE to explain tachycardia, syncope, and elevated troponin.  2. Left ventricular ejection fraction, by estimation, is 60 to 65%. The left ventricle has normal function. The left ventricle has no regional wall motion abnormalities. Left ventricular diastolic parameters were normal. There is the interventricular septum is flattened in systole, consistent with right ventricular pressure overload.  3. Right ventricular systolic function is moderately reduced. The right ventricular size is severely enlarged.  4. The mitral valve is grossly normal. Trivial mitral valve regurgitation. No evidence of mitral stenosis.  5. The aortic valve is tricuspid. Aortic valve regurgitation is not visualized. No aortic stenosis is present. Conclusion(s)/Recommendation(s): Findings consistent with Cor Pulmonale. FINDINGS  Left Ventricle: Left ventricular ejection fraction, by estimation, is 60 to 65%. The left ventricle has normal  function. The left ventricle has no regional wall motion abnormalities. The left ventricular internal cavity size was normal in size. There is  no left ventricular hypertrophy. The interventricular septum is flattened in systole, consistent with right ventricular pressure overload. Left ventricular diastolic parameters were normal. Normal left ventricular filling pressure. Right Ventricle: The right ventricular size is severely enlarged. No increase in right ventricular wall thickness. Right ventricular systolic function is moderately reduced. Left Atrium: Left atrial size was normal in size. Right Atrium: Right atrial size was normal in size. Pericardium: Trivial pericardial effusion is present. Presence of pericardial fat pad. Mitral Valve: The mitral valve is grossly normal. Trivial mitral valve regurgitation. No evidence of mitral valve stenosis. Tricuspid Valve: The tricuspid valve is grossly normal. Tricuspid valve regurgitation is trivial. No evidence of tricuspid stenosis. Aortic Valve: The aortic valve is tricuspid. Aortic valve regurgitation is not visualized. No aortic stenosis is present. Aortic valve mean gradient measures 4.0 mmHg. Aortic valve peak gradient measures 7.2 mmHg. Aortic valve area, by VTI measures 2.58 cm. Pulmonic Valve: The pulmonic valve was grossly normal. Pulmonic valve regurgitation is not visualized. No evidence of pulmonic stenosis. Aorta: The aortic root is normal in size and structure. Venous: The inferior vena cava was not well visualized. IAS/Shunts: The atrial septum is grossly normal.  LEFT VENTRICLE PLAX 2D LVIDd:         4.90 cm  Diastology LVIDs:         2.80 cm  LV e' medial:    8.81 cm/s LV PW:         1.10 cm  LV E/e' medial:  8.2 LV IVS:        1.10 cm  LV e' lateral:   12.80 cm/s LVOT diam:     2.30 cm  LV E/e' lateral: 5.6 LV SV:         55 LV SV Index:   28 LVOT Area:     4.15 cm  RIGHT VENTRICLE RV Basal diam:  2.80 cm RV S prime:     11.50 cm/s TAPSE (M-mode):  1.8 cm LEFT ATRIUM             Index       RIGHT ATRIUM           Index LA diam:        4.00 cm 2.00 cm/m  RA Area:     17.10 cm LA Vol (A2C):   41.8 ml 20.94 ml/m RA Volume:   41.10 ml  20.59 ml/m LA Vol (A4C):   37.2 ml 18.63 ml/m LA Biplane Vol: 40.5 ml 20.29 ml/m  AORTIC VALVE AV Area (Vmax):    2.98 cm AV Area (Vmean):   2.95 cm AV Area (VTI):     2.58 cm AV Vmax:           134.00 cm/s AV Vmean:          99.000 cm/s AV VTI:            0.214 m AV Peak Grad:      7.2 mmHg AV Mean Grad:      4.0 mmHg LVOT  Vmax:         96.00 cm/s LVOT Vmean:        70.200 cm/s LVOT VTI:          0.133 m LVOT/AV VTI ratio: 0.62  AORTA Ao Root diam: 3.40 cm MITRAL VALVE               TRICUSPID VALVE MV Area (PHT): 3.42 cm    TR Peak grad:   26.0 mmHg MV Decel Time: 222 msec    TR Vmax:        255.00 cm/s MV E velocity: 72.00 cm/s MV A velocity: 62.10 cm/s  SHUNTS MV E/A ratio:  1.16        Systemic VTI:  0.13 m                            Systemic Diam: 2.30 cm Eleonore Chiquito MD Electronically signed by Eleonore Chiquito MD Signature Date/Time: 01/13/2020/3:44:25 PM    Final    VAS [place] LOWER EXTREMITY VENOUS (DVT)  Result Date: 01/14/2020  Lower Venous DVTStudy Indications: Suspected PE.  Risk Factors: Surgery 01-12-20 Irrigation and debridement left lower Ext. Limitations: Open wound and Right Above the knee Amputation. Comparison Study: 12-01-2011 Performing Technologist: Griffin Basil RCT RDMS  Examination Guidelines: A complete evaluation includes B-mode imaging, spectral Doppler, color Doppler, and power Doppler as needed of all accessible portions of each vessel. Bilateral testing is considered an integral part of a complete examination. Limited examinations for reoccurring indications may be performed as noted. The reflux portion of the exam is performed with the patient in reverse Trendelenburg.  +---------+---------------+---------+-----------+----------+--------------+ RIGHT     CompressibilityPhasicitySpontaneityPropertiesThrombus Aging +---------+---------------+---------+-----------+----------+--------------+ CFV      Full           Yes      Yes                                 +---------+---------------+---------+-----------+----------+--------------+ SFJ      Full                                                        +---------+---------------+---------+-----------+----------+--------------+ FV Prox  Full                                                        +---------+---------------+---------+-----------+----------+--------------+ FV Mid   Full                                                        +---------+---------------+---------+-----------+----------+--------------+ FV DistalFull                                                        +---------+---------------+---------+-----------+----------+--------------+ Right Above the knee Amputaion.  Right  Technical Findings: No DVT seen in the visualized areas.  +-------+---------------+---------+-----------+----------+--------------+ LEFT   CompressibilityPhasicitySpontaneityPropertiesThrombus Aging +-------+---------------+---------+-----------+----------+--------------+ CFV    Full           Yes      Yes                                 +-------+---------------+---------+-----------+----------+--------------+ SFJ    Full                                                        +-------+---------------+---------+-----------+----------+--------------+ FV ProxFull                                                        +-------+---------------+---------+-----------+----------+--------------+ FV Mid Full                                                        +-------+---------------+---------+-----------+----------+--------------+ Left Leg Open wound with Vac and Bandage.  Left Technical Findings: No DVT seen in the visualized areas.   Summary: RIGHT: - There is no  evidence of deep vein thrombosis in the lower extremity.  LEFT: - There is no evidence of deep vein thrombosis in the lower extremity.  *See table(s) above for measurements and observations. Electronically signed by Deitra Mayo MD on 01/14/2020 at 8:47:25 AM.    Final    [place] EKG SITE RITE  Result Date: 01/14/2020 If Site Rite image not attached, placement could not be confirmed due to current cardiac rhythm.   Cardiac Studies   Left heart cath 2018:  Normal coronary arteries.  Low normal LV function. EF estimated to be 50%.  Continuous chest discomfort during the procedure.  RECOMMENDATIONS:   Chest discomfort is not related to obstructive coronary disease.  Consider pericardial or other nonischemic chest pain etiology  IMPRESSIONS    1. The RV is severely dilated with moderately reduced function. Would  consider acute PE to explain tachycardia, syncope, and elevated troponin.  2. Left ventricular ejection fraction, by estimation, is 60 to 65%. The  left ventricle has normal function. The left ventricle has no regional  wall motion abnormalities. Left ventricular diastolic parameters were  normal. There is the interventricular  septum is flattened in systole, consistent with right ventricular pressure  overload.  3. Right ventricular systolic function is moderately reduced. The right  ventricular size is severely enlarged.  4. The mitral valve is grossly normal. Trivial mitral valve  regurgitation. No evidence of mitral stenosis.  5. The aortic valve is tricuspid. Aortic valve regurgitation is not  visualized. No aortic stenosis is present.   Conclusion(s)/Recommendation(s): Findings consistent with Cor Pulmonale.   FINDINGS  Left Ventricle: Left ventricular ejection fraction, by estimation, is 60  to 65%. The left ventricle has normal function. The left ventricle has no  regional wall motion abnormalities. The left ventricular internal cavity  size  was normal in size. There is  no left ventricular hypertrophy. The  interventricular septum is flattened  in systole, consistent with right ventricular pressure overload. Left  ventricular diastolic parameters were normal. Normal left ventricular  filling pressure.   Right Ventricle: The right ventricular size is severely enlarged. No  increase in right ventricular wall thickness. Right ventricular systolic  function is moderately reduced.   Left Atrium: Left atrial size was normal in size.   Right Atrium: Right atrial size was normal in size.   Pericardium: Trivial pericardial effusion is present. Presence of  pericardial fat pad.   Mitral Valve: The mitral valve is grossly normal. Trivial mitral valve  regurgitation. No evidence of mitral valve stenosis.   Tricuspid Valve: The tricuspid valve is grossly normal. Tricuspid valve  regurgitation is trivial. No evidence of tricuspid stenosis.   Aortic Valve: The aortic valve is tricuspid. Aortic valve regurgitation is  not visualized. No aortic stenosis is present. Aortic valve mean gradient  measures 4.0 mmHg. Aortic valve peak gradient measures 7.2 mmHg. Aortic  valve area, by VTI measures 2.58  cm.   Pulmonic Valve: The pulmonic valve was grossly normal. Pulmonic valve  regurgitation is not visualized. No evidence of pulmonic stenosis.   Aorta: The aortic root is normal in size and structure.   Venous: The inferior vena cava was not well visualized.   IAS/Shunts: The atrial septum is grossly normal.   LE Doppler ultrasound 01/13/20: Summary:  RIGHT:  - There is no evidence of deep vein thrombosis in the lower extremity.    LEFT:  - There is no evidence of deep vein thrombosis in the lower extremity.   Patient Profile     54 y.o. male with history of HTN, obesity, right AKA, Munchausen syndrome, and normal coronaries on cath in 2018 who presented with septic knee arthritis and left distal femur/proximal tibia oseto  s/p I and D with placement of ABX cement beads and wound vac. Cardiology is consulted for syncope.  Assessment & Plan    #Syncope  #Concern for Cor Pulmonale on TTE #Sinus Tachycardia: #Chest pain Patient with evidence of severe RV dilation and moderate systolic dysfunction in the setting of syncope, elevated trop and sinus tachycardia concerning for acute PE. Difficult IV access requiring central line placement. On AC with heparin. Awaiting CTA -TTE with evidence of cor pulmonale -Follpw-up CTA results -Continue heparin gtt for [facility] -Will hold on DOAC for now as planned to return to OR for repeat I and D with ortho -Monitor H/H closely with AC  #Septic left knee with left distal femur/proximal tibia oseto: S/p I and D with placement of ABX beads and wound vac per ortho. -Management per ortho team     For questions or updates, please contact [place] Please consult [email or web address] for contact info under        Signed, Freada Bergeron, MD  01/15/2020, 8:50 AM

## 2020-01-16 DIAGNOSIS — R778 Other specified abnormalities of plasma proteins: Secondary | ICD-10-CM | POA: Diagnosis not present

## 2020-01-16 DIAGNOSIS — D500 Iron deficiency anemia secondary to blood loss (chronic): Secondary | ICD-10-CM | POA: Diagnosis not present

## 2020-01-16 DIAGNOSIS — I2699 Other pulmonary embolism without acute cor pulmonale: Secondary | ICD-10-CM | POA: Diagnosis not present

## 2020-01-16 DIAGNOSIS — Z89611 Acquired absence of right leg above knee: Secondary | ICD-10-CM | POA: Diagnosis not present

## 2020-01-16 DIAGNOSIS — R55 Syncope and collapse: Secondary | ICD-10-CM | POA: Diagnosis not present

## 2020-01-16 DIAGNOSIS — I10 Essential (primary) hypertension: Secondary | ICD-10-CM | POA: Diagnosis not present

## 2020-01-16 LAB — CBC
HCT: 25.2 % — ABNORMAL LOW (ref 39.0–52.0)
Hemoglobin: 7.8 g/dL — ABNORMAL LOW (ref 13.0–17.0)
MCH: 25.3 pg — ABNORMAL LOW (ref 26.0–34.0)
MCHC: 31 g/dL (ref 30.0–36.0)
MCV: 81.8 fL (ref 80.0–100.0)
Platelets: 378 10*3/uL (ref 150–400)
RBC: 3.08 MIL/uL — ABNORMAL LOW (ref 4.22–5.81)
RDW: 17.9 % — ABNORMAL HIGH (ref 11.5–15.5)
WBC: 7.3 10*3/uL (ref 4.0–10.5)
nRBC: 0.3 % — ABNORMAL HIGH (ref 0.0–0.2)

## 2020-01-16 LAB — HEPARIN LEVEL (UNFRACTIONATED)
Heparin Unfractionated: 1.02 IU/mL — ABNORMAL HIGH (ref 0.30–0.70)
Heparin Unfractionated: 0.45 IU/mL (ref 0.30–0.70)

## 2020-01-16 MED ORDER — SODIUM CHLORIDE 0.9% FLUSH
10.0000 mL | INTRAVENOUS | Status: DC | PRN
  Administered 2020-01-23: 30 mL
  Administered 2020-01-24: 40 mL

## 2020-01-16 MED ORDER — SODIUM CHLORIDE 0.9% FLUSH
10.0000 mL | Freq: Two times a day (BID) | INTRAVENOUS | Status: DC
  Administered 2020-01-16: 20 mL
  Administered 2020-01-17 (×2): 10 mL
  Administered 2020-01-18: 30 mL
  Administered 2020-01-18: 10 mL
  Administered 2020-01-19: 20 mL
  Administered 2020-01-19 – 2020-01-20 (×2): 10 mL
  Administered 2020-01-21: 40 mL
  Administered 2020-01-21: 10 mL
  Administered 2020-01-21: 20 mL
  Administered 2020-01-22: 10 mL
  Administered 2020-01-22 – 2020-01-23 (×2): 20 mL
  Administered 2020-01-24: 10 mL

## 2020-01-16 MED ORDER — HYDROMORPHONE HCL 1 MG/ML IJ SOLN
2.0000 mg | Freq: Every evening | INTRAMUSCULAR | Status: DC | PRN
  Administered 2020-01-17 – 2020-01-18 (×3): 2 mg via INTRAVENOUS
  Filled 2020-01-16 (×3): qty 2

## 2020-01-16 NOTE — Consult Note (Signed)
NAME:  NEEMA FLUEGGE, MRN:  [id number], DOB:  07-09-1965, LOS: 5 ADMISSION DATE:  01/11/2020, CONSULTATION DATE:  10/31 REFERRING MD:  Elder Love, CHIEF COMPLAINT:  Pulmonary emboli    Brief History   54 year old male admitted initially admitted after syncopal episode w/ trop. All in context of infected left knee. Seen by cards who recommended CT chest. Went to OR for I&D on 10/28. CT chest and ECHO completed 10/29 w/ severe RV dilation and reduced RV fxn. 10/30 CT chest w/ bilateral mult PE. Pulm asked to see 10/31 given surgical risk concern as he was planned to go back to OR on 10/2 or 10/3  History of present illness    54 year old male admitted 10/27 w/ left knee septic arthritis. Had prior I&D at [facility] 10/3. 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 Presented to [facility] after syncopal event. Refused xfer to [employer]. Ortho was consulted for evaluation of left knee drainage. On 10/28 seen by ortho w/ plan to proceed w/ I&D and abx bead placement.  Cards seen 10/28: felt trop elevation not cardiac a CT of chest was recommended 10/28 underwent I&D of left left w/ abx cement  10/29 seen by ID. 1. The RV is severely dilated with moderately reduced function. Would consider acute PE to explain tachycardia, syncope, and elevated troponin.2. Left ventricular ejection fraction, by estimation, is 60 to 65%. The left ventricle has normal function. The left ventricle has no regional wall motion abnormalities. Left ventricular diastolic parameters were normal. There is the interventricular septum is flattened in systole, consistent with right ventricular pressure overload. 3. Right ventricular systolic function is moderately reduced. The right ventricular size is severely enlarged. CT chest: . Bilateral pulmonary emboli without right heart strain as detailed above.2. No other acute abnormality.   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   Significant Hospital Events   10/28: seen by cards, ortho and underwent I&D of left left w/ abx cement  10/29 seen by ID. 1. The RV is severely dilated with moderately reduced function. Would consider acute PE to explain tachycardia, syncope, and elevated troponin.2. Left ventricular ejection fraction, by estimation, is 60 to 65%. The left ventricle has normal function. The left ventricle has no regional wall motion abnormalities. Left ventricular diastolic parameters were normal. There is the interventricular septum is flattened in systole, consistent with right ventricular pressure overload. 3. Right ventricular systolic function is moderately reduced. The right ventricular size is severely enlarged. CT chest: . Bilateral pulmonary emboli without right heart strain as detailed above.2. No other acute abnormality.   Consults:  Ortho ID  Cards Critical care   Procedures:    Significant Diagnostic Tests:   ECHO 10/29: The RV is severely dilated with moderately reduced function. Would consider acute PE to explain tachycardia, syncope, and elevated troponin.2. Left ventricular ejection fraction, by estimation, is 60 to 65%. The left ventricle has normal function. The left ventricle has no regional wall motion abnormalities. Left ventricular diastolic parameters were normal. There is the interventricular septum is flattened in systole, consistent with right ventricular pressure overload. 3. Right ventricular systolic function is moderately reduced. The right ventricular size is severely enlarged.  CT chest: 10/31: Bilateral pulmonary emboli without right heart strain as detailed above.2. No other acute abnormality.  ECHO 11/1: Mildly reduced RV, improved compared to prior. EF 60-65%  Micro Data:  10/27: resp viral panel: negative 10/28: BCX2: >>> 10/28: surgical wound PUSS: few beta lactamase + prevotella denticola  Antimicrobials:    unasyn 10/29 >>> Rocephin/tobra/vanc 10/28 Interim history/subjective:  Reports pleuritic chest pain. Able to sit up. On RA  Objective   Blood pressure (!) 150/85, pulse 100, temperature 98.1 F (36.7 C), temperature source Oral, resp. rate 18, height 5\' 10"  (1.778 m), weight 79.4 kg, SpO2 97 %.        Intake/Output Summary (Last 24 hours) at 01/16/2020 1003 Last data filed at 01/16/2020 8299 Gross per 24 hour  Intake 120 ml  Output 300 ml  Net -180 ml   Filed Weights   01/11/20 1749 01/12/20 0600 01/14/20 0326  Weight: 86.6 kg 81.7 kg 79.4 kg   Physical Exam: General: Well-appearing, no acute distress HENT: [place], AT, OP clear, MMM, R IJ in place Eyes: EOMI, no scleral icterus Respiratory: Clear to auscultation bilaterally.  No crackles, wheezing or rales Cardiovascular: RRR, -M/R/G, no JVD Extremities:R AKA, left leg wrapped in ACE Neuro: AAO x4, CNII-XII grossly intact Skin: Intact, no rashes or bruising Psych: Normal mood, normal affect   Resolved Hospital Problem list     Assessment & Plan:   Septic left knee joint hx of recurrent infection requiring multiple washouts, management per Ortho Provoked bilateral submassive PE Acute Cor Pulmonale - improved Syncopal event  Acute on chronic anemia, post-op blood loss anemia  Admitted for syncope from physical therapy. Septic arthritis of left knee s/p I&D on 37/16 complicated by submassive pulmonary emboli with cor pulmonale. Repeat echocardiogram reviewed and demonstrates improved RV function compared to 10/29. Would not recommend additional intervention given improvement with medical management alone. For his septic arthritis, orthopedics managing. Would recommend proceeding with urgent procedures as indicated per surgical team with the plan to minimize the time anticoagulation is paused. Heparin would be best anticoagulation while inpatient. --Peri-operative anticoagulation with heparin gtt. Please coordinate with  surgical team the timing to hold anticoagulation to minimize risk of bleeding --Transition to [place] prior to discharge --Will need 3-6 months of anticoagulation --Continue IS while inpatient  Pulmonary will sign off. Please call consult team for any questions or concerns.  Best practice:  Per primary   Labs   CBC: Recent Labs  Lab 01/11/20 1816 01/11/20 1816 01/12/20 0540 01/12/20 1504 01/13/20 1845 01/14/20 0543 01/14/20 2300 01/15/20 0410 01/16/20 0328  WBC 12.0*   < > 9.7   < > 9.4 13.7* 7.7 7.7 7.3  NEUTROABS 9.9*  --  7.4  --   --   --   --   --   --   HGB 9.0*   < > 8.0*   < > 6.5* 9.0* 7.9* 7.9* 7.8*  HCT 31.7*   < > 26.7*   < > 21.9* 28.9* 25.4* 26.0* 25.2*  MCV 83.4   < > 79.7*   < > 80.2 81.0 79.1* 80.5 81.8  PLT 497*   < > 493*   < > 295 403* 378 391 378   < > = values in this interval not displayed.    Basic Metabolic Panel: Recent Labs  Lab 01/11/20 1816 01/11/20 1816 01/12/20 0540 01/12/20 1504 01/13/20 0308 01/14/20 0543 01/15/20 0300  NA 137  --  140  --  138 139 139  K 3.7  --  3.6  --  4.9 4.6 4.1  CL 100  --  107  --  105 104 103  CO2 21*  --  23  --  24 26 27   GLUCOSE 81  --  87  --  207* 180* 196*  BUN 20  --  15  --  15 14 17   CREATININE 1.09   < > 0.84 1.05 0.84 0.94 0.83  CALCIUM 9.3  --  8.9  --  8.4* 8.6* 8.3*  MG  --   --  1.9  --   --   --   --   PHOS  --   --  3.8  --   --   --   --    < > = values in this interval not displayed.   GFR: Estimated Creatinine Clearance: 108.7 mL/min (by C-G formula based on SCr of 0.83 mg/dL). Recent Labs  Lab 01/12/20 0540 01/12/20 1504 01/14/20 0543 01/14/20 2300 01/15/20 0410 01/16/20 0328  WBC 9.7   < > 13.7* 7.7 7.7 7.3  LATICACIDVEN 0.6  --   --   --   --   --    < > = values in this interval not displayed.    Liver Function Tests: Recent Labs  Lab 01/11/20 1816 01/12/20 0540  AST 18 16  ALT 18 16  ALKPHOS 102 84  BILITOT 1.5* 1.2  PROT 8.1 7.5  ALBUMIN 2.8* 2.5*   No  results for input(s): LIPASE, AMYLASE in the last 168 hours. No results for input(s): AMMONIA in the last 168 hours.  ABG    Component Value Date/Time   TCO2 28 03/03/2018 1722     Coagulation Profile: Recent Labs  Lab 01/12/20 0540  INR 1.2    Cardiac Enzymes: Recent Labs  Lab 01/12/20 0540  CKTOTAL 33*    HbA1C: No results found for: HGBA1C  CBG: Recent Labs  Lab 01/12/20 0118 01/12/20 0212 01/12/20 0829 01/12/20 2219 01/13/20 2056  GLUCAP 74 79 81 190* 109*    Signature   Care Time: 35 minutes Reviewed prior documentation, coordinating care and discussing medical diagnosis and plan with the patient/family. Imaging, labs and tests included in this note have been reviewed and interpreted independently by me.  Rodman Pickle, M.D. [facility] Pulmonary/Critical Care Medicine 01/16/2020 10:03 AM

## 2020-01-16 NOTE — TOC Initial Note (Signed)
Transition of Care [facility]) - Initial/Assessment Note    Patient Details  Name: Jason [name] MRN: [id number] Date of Birth: 1965/11/27  Transition of [facility]) CM/SW Contact:    Ninfa Meeker, RN Phone Number: [phone] 01/16/2020, 10:53 AM  Clinical Narrative:  Patient is 54 yr old male admitted with Left septic knee arthritis and Left distal femur /tibial osteo. Patient also being treated for bilateral PE's. Case manager spoke with patient concerning need for Home Health therapy. He states he lives alone but his son occasionally stays with him, his girlfriend lives nearby and he has a neighbor that assists him as well.Patient has wheelchair, will ask therapist for crutches.  Choice offered for [place], patient has no preference. Referral called to Adela Lank, [facility] Liaison. Patient will be returning to OR on 01/18/20 for further exploration of knee by orthopedic surgeon. TOC Team will continue to monitor.             Expected Discharge Plan: [place] Barriers to Discharge: Continued Medical Work up   Patient Goals and CMS Choice Patient states their goals for this hospitalization and ongoing recovery are:: get better CMS Medicare.gov Compare Post Acute Care list provided to:: Patient Choice offered to / list presented to : Patient  Expected Discharge Plan and Services Expected Discharge Plan: [place]   Discharge Planning Services: CM Consult Post Acute Care Choice: Home Health (has wheelchair) Living arrangements for the past 2 months: [place] Arranged: PT, OT HH Agency: [place] Date [place]: 01/16/20 Time HH Agency Contacted: 26 Representative spoke with at [place]: Adela Lank  Prior Living Arrangements/Services Living arrangements for the past 2 months: [place] Lives with:: Self (Son stay with him  occasionally) Patient language and need for interpreter reviewed:: Yes Do you feel safe going back to the place where you live?: Yes      Need for Family Participation in Patient Care: Yes (Comment) Care giver support system in place?: Yes (comment) (girlfriend lives near and has neighbor as well) Current home services: DME Criminal Activity/Legal Involvement Pertinent to Current Situation/Hospitalization: No - Comment as needed  Activities of Daily Living Home Assistive Devices/Equipment: Crutches, Shower chair with back, Environmental consultant (specify type), Wheelchair ADL Screening (condition at time of admission) Patient's cognitive ability adequate to safely complete daily activities?: Yes Is the patient deaf or have difficulty hearing?: No Does the patient have difficulty seeing, even when wearing glasses/contacts?: No Does the patient have difficulty concentrating, remembering, or making decisions?: No Patient able to express need for assistance with ADLs?: Yes Does the patient have difficulty dressing or bathing?: No Independently performs ADLs?: Yes (appropriate for developmental age) Does the patient have difficulty walking or climbing stairs?: Yes Weakness of Legs: Left Weakness of Arms/Hands: None  Permission Sought/Granted Permission sought to share information with : Case Manager       Permission granted to share info w AGENCY: Al[facility]mings        Emotional Assessment   Attitude/Demeanor/Rapport: Gracious   Orientation: : Oriented to Self, Oriented to Place, Oriented to  Time, Oriented to Situation Alcohol / Substance Use: Not Applicable Psych Involvement: No (comment)  Admission diagnosis:  Chest pain [R07.9] Syncope, unspecified syncope type [R55] Pyogenic arthritis of left knee  joint, due to unspecified organism [facility]) [M00.9] Patient Active Problem List   Diagnosis Date Noted  . Bilateral pulmonary embolism ([place]) 01/15/2020  . Encounter for central line placement   . Septic  arthritis ([place]) 01/11/2020  . Syncope and collapse 01/11/2020  . Elevated troponin 01/11/2020  . Chest pain 01/11/2020  . Bright red blood per rectum 01/11/2020  . Anemia 01/11/2020  . Pain management contract broken 09/13/2018  . Benzodiazepine dependence, continuous ([place]) 12/25/2017  . Amputation stump pain ([place]) 11/18/2017  . Opiate dependence ([place]) 05/06/2017  . Primary insomnia 05/06/2017  . Functional diarrhea 05/06/2017  . Phantom limb pain ([place]) 05/06/2017  . Muscle atrophy of lower extremity 05/06/2017  . Other chronic pain 05/06/2017  . Essential hypertension 07/14/2016  . S/P AKA (above knee amputation) unilateral, right ([place]) 07/14/2016  . Shoulder impingement syndrome, left 07/29/2011   PCP:  Patient, No Pcp Per Pharmacy:   [facility], [place] [address] Phone: [phone] Fax: [phone]  [id number] [place], [address] [place] [address] Phone: [phone] Fax: [place], [place] [address] Phone: [phone] Fax: [phone]     Social Determinants of Health (SDOH) Interventions    Readmission Risk Interventions No flowsheet data found.

## 2020-01-16 NOTE — Progress Notes (Signed)
[place] for heparin Indication: pulmonary embolus  Allergies  Allergen Reactions  . Ivp Dye [Iodinated Diagnostic Agents] Anaphylaxis    Can be pre-treated with Benadryl  . Metrizamide Anaphylaxis  . Other Other (See Comments)    Under no circumstances will the patient agree to a PICC line  . Reglan [Metoclopramide] Anaphylaxis  . Shellfish-Derived Products Anaphylaxis  . Codeine Hives  . Lidocaine Other (See Comments) and Rash    Pt not sure if this is an actual allergy - might have been a one time incident  . Methadone Hives  . Morphine Swelling and Rash    Local reaction to IV being pushed too fast  . Propoxyphene Hives  . Toradol [Ketorolac Tromethamine] Itching    Patient having systemic itching after receiving IV dose  . Iodine Rash    Reports localized reaction at IV site. Able to tolerate with Benadryl.   . Tape Rash  . Vancomycin Rash    Has had vancomycin since this reaction and had no reaction at all    Patient Measurements: Height: 5\' 10"  (177.8 cm) Weight: 79.4 kg (175 lb 0.7 oz) IBW/kg (Calculated) : 73 Heparin Dosing Weight: 79.4 kg  Vital Signs: BP: 150/85 (11/01 0615)  Labs: Recent Labs    01/14/20 0543 01/14/20 0543 01/14/20 0956 01/14/20 2300 01/14/20 2300 01/15/20 0100 01/15/20 0300 01/15/20 0410 01/16/20 0328  HGB 9.0*   < >  --  7.9*   < >  --   --  7.9* 7.8*  HCT 28.9*   < >  --  25.4*  --   --   --  26.0* 25.2*  PLT 403*   < >  --  378  --   --   --  391 378  HEPARINUNFRC <0.10*  --  0.12*  --   --   --   --   --  1.02*  CREATININE 0.94  --   --   --   --   --  0.83  --   --   TROPONINIHS  --   --   --   --   --  65* 56*  --   --    < > = values in this interval not displayed.    Estimated Creatinine Clearance: 108.7 mL/min (by C-G formula based on SCr of 0.83 mg/dL).   Medical History: Past Medical History:  Diagnosis Date  . Allergy   . Anxiety   . Arthritis    left shoulder  .  Bronchitis   . Chest pain 07/14/2016  . Complication of anesthesia   . DJD (degenerative joint disease) 11/29/2011  . Hypertension   . Neuromuscular disorder ([place])    carpal tunnel bilateral, ulner nerve surgery  . PONV (postoperative nausea and vomiting)   . Septic arthritis of knee, right ([place]) 11/29/2011  . Spinal headache    "long time ago"   Assessment: 54 yr old male POD#2 I&D L knee and L anterior thigh abscess. Pharmacy consulted 10/30 to switch IV heparin to Lovenox for possible pulmonary embolism due to difficult IV access, need for central line placement and CTA.   10/31: CTA showed bilateral non-occlusive PE. Pharmacy now consulted to switch back to heparin with plans to return to OR for repeat I&D.   Morning heparin level above goal, rate adjusted. Repeat level at goal. Hgb stable at 7.8 this am.   Goal of Therapy:   Heparin level 0.3-0.7 units/mL Monitor platelets  by anticoagulation protocol: Yes   Plan:  Continue heparin at 1200 units/hr Plan for surgery 11/3  Erin Hearing PharmD., BCPS Clinical Pharmacist 01/16/2020 12:23 PM

## 2020-01-16 NOTE — Progress Notes (Signed)
Physical Therapy Treatment Patient Details Name: Jason [name] MRN: [id number] DOB: [birth year] Today's Date: 01/16/2020    History of Present Illness Pt adm lt septic knee arthritis and lt distal femur/proximal tibia osteomyelitis. On 10/28 underwent I&D of lt knee and lt anterior thigh abscess and placement of antibiotic beads and cement. PMH - Rt AKA, rt thr, Munchausen syndrome, HTN, anxiety, arthritis, multiple shoulder surgeries.     PT Comments    Pt is in bed with wound vac, had bandage on knee cut off by MD.  He is not tolerating much movement but did get him to agree to some initial gentle moving on LLE.  Pt is beginning to tolerate a little with very careful assist to move, and will progress him as he can permit.  Will try to get to the chair next visit.   Follow Up Recommendations  SNF     Equipment Recommendations  None recommended by PT    Recommendations for Other Services       Precautions / Restrictions Precautions Precautions: Fall Restrictions Weight Bearing Restrictions: Yes LLE Weight Bearing: Weight bearing as tolerated    Mobility  Bed Mobility Overal bed mobility: Needs Assistance             General bed mobility comments: declined to sit up on side of bed  Transfers                    Ambulation/Gait                 Stairs             Wheelchair Mobility    Modified Rankin (Stroke Patients Only)       Balance                                            Cognition Arousal/Alertness: Awake/alert Behavior During Therapy: WFL for tasks assessed/performed Overall Cognitive Status: Within Functional Limits for tasks assessed                                        Exercises General Exercises - Lower Extremity Ankle Circles/Pumps: AROM;Both;10 reps;Supine Quad Sets: AROM;10 reps Gluteal Sets: AROM;Both;5 reps;Supine Hip ABduction/ADduction: AAROM;10 reps    General Comments  General comments (skin integrity, edema, etc.): Pt is extremely guarded about moving his LLEdespite permission from MD      Pertinent Vitals/Pain Pain Assessment: Faces Faces Pain Scale: Hurts even more Pain Location: lt knee and leg Pain Descriptors / Indicators: Guarding;Grimacing Pain Intervention(s): Monitored during session;Repositioned;Premedicated before session    Home Living                      Prior Function            PT Goals (current goals can now be found in the care plan section) Progress towards PT goals: Not progressing toward goals - comment    Frequency    Min 3X/week      PT Plan Discharge plan needs to be updated    Co-evaluation              AM-PAC PT "6 Clicks" Mobility   Outcome Measure  Help needed turning from your back to your side while in a  flat bed without using bedrails?: Total Help needed moving from lying on your back to sitting on the side of a flat bed without using bedrails?: Total Help needed moving to and from a bed to a chair (including a wheelchair)?: Total Help needed standing up from a chair using your arms (e.g., wheelchair or bedside chair)?: Total Help needed to walk in hospital room?: Total Help needed climbing 3-5 steps with a railing? : Total 6 Click Score: 6    End of Session   Activity Tolerance: Patient limited by pain Patient left: in bed;with call bell/phone within reach;with bed alarm set Nurse Communication: Mobility status PT Visit Diagnosis: Other abnormalities of gait and mobility (R26.89);Pain Pain - Right/Left: Left Pain - part of body: Knee;Leg     Time: 8921-1941 PT Time Calculation (min) (ACUTE ONLY): 23 min  Charges:  $Therapeutic Exercise: 8-22 mins $Therapeutic Activity: 8-22 mins                 Ramond Dial 01/16/2020, 9:03 PM  Mee Hives, PT MS Acute Rehab Dept. Number: [place] and [place]

## 2020-01-16 NOTE — Progress Notes (Signed)
PROGRESS NOTE    KODA ROUTON  GUR:[id number] DOB: Jul 03, 1965 DOA: 01/11/2020 PCP: Patient, No Pcp Per   Brief Narrative: Jason [name] is a 54 y.o. male with medical history significant of  Sp AKA, septir arthritis of left knee, h/o opioid dependence,documented Munchausen syndrome. Patient presented secondary to LOC, chest pain and knee pain. Syncope workup in addition to management of septic arthritis.   Assessment & Plan:   Principal Problem:   Bilateral pulmonary embolism (HCC) Active Problems:   Essential hypertension   S/P AKA (above knee amputation) unilateral, right (HCC)   Other chronic pain   Septic arthritis (HCC)   Syncope and collapse   Elevated troponin   Chest pain   Bright red blood per rectum   Anemia   Encounter for central line placement   Chest pain Present on admission and has improved. Cardiology consulted. HS troponin elevated: 77>93>68. Elevated D-dimer of 9.72 and Transthoracic Echocardiogram significant for RV dilation with concern for possible PE. No chest pain currently. On room air. Negative venous duplex for DVT. -Telemetry -Cardiology recommendations: metoprolol started  Bilateral PEs Submassive PE Non-occlusive but it is possible that with treatment clots may have been more centrally located. Left side includes distal main artery with segmental clots in upper/lower lobe in addition to segmental clots on right in upper/lower lobe. No heart strain per CT but initial Transthoracic Echocardiogram with moderate RV dilation and reduced function. Repeat Transthoracic Echocardiogram significant for improved RV. -Pulmonology recommendations: heparin drip. No targeted therapy secondary to improved RV function on repeat Transthoracic Echocardiogram.  Syncope and collapse Likely secondary to PE.  -Management above  Septic arthritis Recurring issue. Per chart review, patient was previously managed at [facility] with concern patient may  have Munchausen and is possibly inoculating himself. Previous cultures (10/3) from OSH significant for significant for strep mitis/oralis/anginosus/parasnguinis with blood culture significant for strep anginosus/strepp salivarius/gemella haemolysans (as mentioned in H&P) with susceptibilities to penicillin. Per patient he received two arthrocenteses while at [place] surgery consulted and patient underwent I&D on 10/28 in the OR with vancomycin/tobramycin cement beads placed. ID consulted by orthopedic surgery on 10/28. Started on Unasyn per ID -ID recommendations: Unasyn IV -Orthopedic surgery recommendations: pain management and plans for re-exploration  Bright red blood per rectum FOBT negative x1. Hemoglobin tended down slightly from admission but still baseline -FOBT x2 more  Acute on chronic Anemia Unknown etiology. Baseline appears to be between 8-9. Worsened hemoglobin with a low of 6.5 on 10/29. Ordered 2 units of PRBC but he received 1.5 secondary to IV infiltration of the second unit. Resultant rebound hemoglobin of 9.0 which has stabilized to about 7.9.    DVT prophylaxis: SCDs Code Status:   Code Status: Full Code Family Communication: None at bedside Disposition Plan: Discharge home pending orthopedic surgery management and ID recommendations for antibiotics   Consultants:   Orthopedic surgery  Cardiology  Pulmonology  Procedures:   INCISION & DRAINAGE (10/28) 1. Incision and drainage of left septic knee 2. Incision and drainage of left anterior thigh abscess 3. Placement of antibiotic cement  4. Incisional wound vac placement   TRANSTHORACIC ECHOCARDIOGRAM (10/29) IMPRESSIONS    1. The RV is severely dilated with moderately reduced function. Would  consider acute PE to explain tachycardia, syncope, and elevated troponin.  2. Left ventricular ejection fraction, by estimation, is 60 to 65%. The  left ventricle has normal function. The left ventricle  has no regional  wall motion abnormalities. Left  ventricular diastolic parameters were  normal. There is the interventricular  septum is flattened in systole, consistent with right ventricular pressure  overload.  3. Right ventricular systolic function is moderately reduced. The right  ventricular size is severely enlarged.  4. The mitral valve is grossly normal. Trivial mitral valve  regurgitation. No evidence of mitral stenosis.  5. The aortic valve is tricuspid. Aortic valve regurgitation is not  visualized. No aortic stenosis is present.   Conclusion(s)/Recommendation(s): Findings consistent with Cor Pulmonale.   TRANSTHORACIC ECHOCARDIOGRAM (10/31) IMPRESSIONS    1. Left ventricular ejection fraction, by estimation, is 60 to 65%. The  left ventricle has normal function. The left ventricle has no regional  wall motion abnormalities.  2. Right ventricular systolic function is mildly reduced. The right  ventricular size is mildly enlarged.   Comparison(s): The right ventricular systolic function has improved.  Antimicrobials:  Unasyn   Subjective: Patient still with some pleuritic pain.  Objective: Vitals:   01/15/20 0716 01/15/20 1449 01/15/20 1945 01/16/20 0615  BP:  129/88 139/88 (!) 150/85  Pulse:   100   Resp: 18 18 20 18   Temp: 98.2 F (36.8 C) 98.2 F (36.8 C) 98.1 F (36.7 C)   TempSrc: Oral Oral Oral   SpO2:   97%   Weight:      Height:        Intake/Output Summary (Last 24 hours) at 01/16/2020 1139 Last data filed at 01/16/2020 9622 Gross per 24 hour  Intake 120 ml  Output 300 ml  Net -180 ml   Filed Weights   01/11/20 1749 01/12/20 0600 01/14/20 0326  Weight: 86.6 kg 81.7 kg 79.4 kg    Examination:  General exam: Appears calm and comfortable Respiratory system: Clear to auscultation. Respiratory effort normal. Cardiovascular system: S1 & S2 heard, RRR. No murmurs, rubs, gallops or clicks. Gastrointestinal system: Abdomen is  nondistended, soft and nontender. No organomegaly or masses felt. Normal bowel sounds heard. Central nervous system: Alert and oriented. No focal neurological deficits. Musculoskeletal: Right AKA. Skin: No cyanosis. No rashes Psychiatry: Judgement and insight appear normal. Mood & affect appropriate.     Data Reviewed: I have personally reviewed following labs and imaging studies  CBC Lab Results  Component Value Date   WBC 7.3 01/16/2020   RBC 3.08 (L) 01/16/2020   HGB 7.8 (L) 01/16/2020   HCT 25.2 (L) 01/16/2020   MCV 81.8 01/16/2020   MCH 25.3 (L) 01/16/2020   PLT 378 01/16/2020   MCHC 31.0 01/16/2020   RDW 17.9 (H) 01/16/2020   LYMPHSABS 1.1 01/12/2020   MONOABS 0.8 01/12/2020   EOSABS 0.2 01/12/2020   BASOSABS 0.0 29/79/8921     Last metabolic panel Lab Results  Component Value Date   NA 139 01/15/2020   K 4.1 01/15/2020   CL 103 01/15/2020   CO2 27 01/15/2020   BUN 17 01/15/2020   CREATININE 0.83 01/15/2020   GLUCOSE 196 (H) 01/15/2020   GFRNONAA >60 01/15/2020   GFRAA >60 08/18/2018   CALCIUM 8.3 (L) 01/15/2020   PHOS 3.8 01/12/2020   PROT 7.5 01/12/2020   ALBUMIN 2.5 (L) 01/12/2020   LABGLOB 2.6 08/14/2017   AGRATIO 1.9 08/14/2017   BILITOT 1.2 01/12/2020   ALKPHOS 84 01/12/2020   AST 16 01/12/2020   ALT 16 01/12/2020   ANIONGAP 9 01/15/2020    CBG (last 3)  Recent Labs    01/13/20 2056  GLUCAP 109*     GFR: Estimated Creatinine Clearance: 108.7 mL/min (  by C-G formula based on SCr of 0.83 mg/dL).  Coagulation Profile: Recent Labs  Lab 01/12/20 0540  INR 1.2    Recent Results (from the past 240 hour(s))  Respiratory Panel by RT PCR (Flu A&B, Covid) - Nasopharyngeal Swab     Status: None   Collection Time: 01/11/20  8:14 PM   Specimen: Nasopharyngeal Swab  Result Value Ref Range Status   SARS Coronavirus 2 by RT PCR NEGATIVE NEGATIVE Final    Comment: (NOTE) SARS-CoV-2 target nucleic acids are NOT DETECTED.  The SARS-CoV-2 RNA is  generally detectable in upper respiratoy specimens during the acute phase of infection. The lowest concentration of SARS-CoV-2 viral copies this assay can detect is 131 copies/mL. A negative result does not preclude SARS-Cov-2 infection and should not be used as the sole basis for treatment or other patient management decisions. A negative result may occur with  improper specimen collection/handling, submission of specimen other than nasopharyngeal swab, presence of viral mutation(s) within the areas targeted by this assay, and inadequate number of viral copies (<131 copies/mL). A negative result must be combined with clinical observations, patient history, and epidemiological information. The expected result is Negative.  Fact Sheet for Patients:  PinkCheek.be  Fact Sheet for Healthcare Providers:  GravelBags.it  This test is no t yet approved or cleared by the [place] FDA and  has been authorized for detection and/or diagnosis of SARS-CoV-2 by FDA under an Emergency Use Authorization (EUA). This EUA will remain  in effect (meaning this test can be used) for the duration of the COVID-19 declaration under Section 564(b)(1) of the Act, 21 U.S.C. section 360bbb-3(b)(1), unless the authorization is terminated or revoked sooner.     Influenza A by PCR NEGATIVE NEGATIVE Final   Influenza B by PCR NEGATIVE NEGATIVE Final    Comment: (NOTE) The Xpert Xpress SARS-CoV-2/FLU/RSV assay is intended as an aid in  the diagnosis of influenza from Nasopharyngeal swab specimens and  should not be used as a sole basis for treatment. Nasal washings and  aspirates are unacceptable for Xpert Xpress SARS-CoV-2/FLU/RSV  testing.  Fact Sheet for Patients: PinkCheek.be  Fact Sheet for Healthcare Providers: GravelBags.it  This test is not yet approved or cleared by the [place] FDA and  has been authorized for detection and/or diagnosis of SARS-CoV-2 by  FDA under an Emergency Use Authorization (EUA). This EUA will remain  in effect (meaning this test can be used) for the duration of the  Covid-19 declaration under Section 564(b)(1) of the Act, 21  U.S.C. section 360bbb-3(b)(1), unless the authorization is  terminated or revoked. Performed at [facility], [place] [address], [place], [place] [address]   Culture, blood (Routine X 2) w Reflex to ID Panel     Status: None (Preliminary result)   Collection Time: 01/12/20  5:39 AM   Specimen: BLOOD LEFT HAND  Result Value Ref Range Status   Specimen Description BLOOD LEFT HAND  Final   Special Requests   Final    BOTTLES DRAWN AEROBIC AND ANAEROBIC Blood Culture adequate volume   Culture   Final    NO GROWTH 4 DAYS Performed at [facility], [place] [address], [place], [place] [address]    Report Status PENDING  Incomplete  Surgical pcr screen     Status: None   Collection Time: 01/12/20 10:31 AM   Specimen: Nasal Mucosa; Nasal Swab  Result Value Ref Range Status   MRSA, PCR NEGATIVE NEGATIVE Final  Staphylococcus aureus NEGATIVE NEGATIVE Final    Comment: (NOTE) The Xpert SA Assay (FDA approved for NASAL specimens in patients 7 years of age and older), is one component of a comprehensive surveillance program. It is not intended to diagnose infection nor to guide or monitor treatment. Performed at [facility], [place] [address], [place], [place] [address]   Aerobic/Anaerobic Culture (surgical/deep wound)     Status: None   Collection Time: 01/12/20 12:31 PM   Specimen: PATH Other; Tissue  Result Value Ref Range Status   Specimen Description TISSUE  Final   Special Requests LEFT KNEE PUSS SPEC A  Final   Gram Stain   Final    MODERATE WBC PRESENT, PREDOMINANTLY PMN FEW GRAM NEGATIVE RODS    Culture   Final    FEW PREVOTELLA DENTICOLA BETA LACTAMASE POSITIVE Performed at [facility], [place] [address] [place], [place] [address]    Report Status 01/15/2020 FINAL  Final  Culture, blood (Routine X 2) w Reflex to ID Panel     Status: None (Preliminary result)   Collection Time: 01/12/20  3:04 PM   Specimen: BLOOD  Result Value Ref Range Status   Specimen Description BLOOD LEFT ANTECUBITAL  Final   Special Requests   Final    BOTTLES DRAWN AEROBIC AND ANAEROBIC Blood Culture adequate volume   Culture   Final    NO GROWTH 4 DAYS Performed at [facility], [place] [address] [place], [place] [address]    Report Status PENDING  Incomplete        Radiology Studies: CT ANGIO CHEST PE W OR WO CONTRAST  Result Date: 01/15/2020 CLINICAL DATA:  54 y.o. male with medical history significant of Sp AKA, septir arthritis of left knee, h/o opioid dependence, documented Munchausen syndrome. Patient presented secondary to LOC, chest pain and knee pain. Syncope workup in addition to management of septic arthritis. Elevated D-dimer. EXAM: CT ANGIOGRAPHY CHEST WITH CONTRAST TECHNIQUE: Multidetector CT imaging of the chest was performed using the standard protocol during bolus administration of intravenous contrast. Multiplanar CT image reconstructions and MIPs were obtained to evaluate the vascular anatomy. CONTRAST:  27mL OMNIPAQUE IOHEXOL 350 MG/ML SOLN COMPARISON:  None. FINDINGS: Cardiovascular: There are bilateral pulmonary emboli. On the right, pulmonary emboli are noted in right lower lobe segmental branches and segmental branches to the anterior and apical segments of the right upper lobe. On the left, pulmonary emboli are seen in the distal main pulmonary artery extending into the apical segmental branch to the left upper lobe and into left lower lobe segmental branches. Emboli are nonocclusive. RV/LV ratio is 0.86, no evidence of right ventricular strain. Heart is normal in size. No pericardial effusion. Left coronary artery calcifications. Great vessels are normal in  caliber. No aortic dissection or atherosclerosis. Mediastinum/Nodes: No enlarged mediastinal, hilar, or axillary lymph nodes. Thyroid gland, trachea, and esophagus demonstrate no significant findings. Lungs/Pleura: Lungs demonstrate subtle areas of hazy opacity likely due to vascular shunting. No evidence of pneumonia or pulmonary edema. No mass or nodule. No pleural effusion or pneumothorax. Upper Abdomen: No acute or significant abnormalities. Musculoskeletal: Slight upper endplate depressions 3 contiguous midthoracic vertebra and a lower thoracic vertebra all of which appear chronic. No convincing acute fracture. No osteoblastic or osteolytic lesions. Review of the MIP images confirms the above findings. IMPRESSION: 1. Bilateral pulmonary emboli without right heart strain as detailed above. 2. No other acute abnormality. Electronically Signed   By: Lajean Manes  M.D.   On: 01/15/2020 11:18   US Guided Needle Placement  Result Date: 01/15/2020 INDICATION: 54 year old male with a history of poor venous access, concern for pulmonary embolism, referred for image guided placement of a peripheral IV EXAM: ULTRASOUND GUIDED VENIPUNCTURE MEDICATIONS: None ANESTHESIA/SEDATION: None FLUOROSCOPY TIME:  Ultrasound COMPLICATIONS: None PROCEDURE: Informed written consent was obtained from the patient after a thorough discussion of the procedural risks, benefits and alternatives. All questions were addressed. Sterile Barrier Technique was utilized including caps, mask, sterile gloves, sterile drape, hand hygiene and skin antiseptic. A timeout was performed prior to the initiation of the procedure. Ultrasound survey of the upper extremity was performed with images stored and sent to PACs. Of note, partially chronically thrombosed cephalic and basilic veins of the right upper extremity. Brachial vein is patent A micropuncture needle was used access the right brachial vein under ultrasound. With venous blood flow returned, an  .018 micro wire was passed through the needle into the vein. The needle was removed, and a micropuncture sheath was placed over the wire. The inner dilator and wire were removed, and the catheter was secured in position after attaching to saline flush. Patient tolerated the procedure well and remained hemodynamically stable throughout. No complications were encountered and no significant blood loss. IMPRESSION: Status post ultrasound guided venipuncture. Signed, Dulcy Fanny. Dellia Nims, RPVI [facility] Electronically Signed   By: Corrie Mckusick D.O.   On: 01/15/2020 14:29   DG Chest Port 1 View  Result Date: 01/14/2020 CLINICAL DATA:  Central line placement. EXAM: PORTABLE CHEST 1 VIEW COMPARISON:  January 11, 2020 FINDINGS: Right internal jugular approach central venous catheter terminates at the expected location of the cavoatrial junction. Cardiomediastinal silhouette is normal. Mediastinal contours appear intact. There is no evidence of focal airspace consolidation, pleural effusion or pneumothorax. Osseous structures are without acute abnormality. Soft tissues are grossly normal. IMPRESSION: Right internal jugular approach central venous catheter terminates at the expected location of the cavoatrial junction. Electronically Signed   By: Fidela Salisbury M.D.   On: 01/14/2020 19:02   ECHOCARDIOGRAM LIMITED  Result Date: 01/15/2020    ECHOCARDIOGRAM LIMITED REPORT   Patient Name:   MONTRAE BRAITHWAITE Date of Exam: 01/15/2020 Medical Rec #:  [id number]      Height:       70.0 in Accession #:    [id number]     Weight:       175.0 lb Date of Birth:  1965-09-06      BSA:          1.972 m Patient Age:    29 years       BP:           138/77 mmHg Patient Gender: M              HR:           90 bpm. Exam Location:  Inpatient Procedure: Limited Echo Indications:    Pulmonary Embolus 415.19 / I26.99  History:        Patient has prior history of Echocardiogram  examinations, most                 recent 01/13/2020. Signs/Symptoms:Chest Pain and Syncope; Risk                 Factors:Hypertension.  Sonographer:    Alvino Chapel RCS Referring Phys: [id number]  1. Left ventricular ejection fraction, by estimation, is 60 to  65%. The left ventricle has normal function. The left ventricle has no regional wall motion abnormalities.  2. Right ventricular systolic function is mildly reduced. The right ventricular size is mildly enlarged. Comparison(s): The right ventricular systolic function has improved. FINDINGS  Left Ventricle: Left ventricular ejection fraction, by estimation, is 60 to 65%. The left ventricle has normal function. The left ventricle has no regional wall motion abnormalities. The left ventricular internal cavity size was normal in size. There is  no left ventricular hypertrophy. Right Ventricle: The right ventricular size is mildly enlarged. No increase in right ventricular wall thickness. Right ventricular systolic function is mildly reduced. Pericardium: There is no evidence of pericardial effusion. Candee Furbish MD Electronically signed by Candee Furbish MD Signature Date/Time: 01/15/2020/4:53:38 PM    Final    [place] EKG SITE RITE  Result Date: 01/14/2020 If Site Rite image not attached, placement could not be confirmed due to current cardiac rhythm.       Scheduled Meds: . sodium chloride   Intravenous Once  . acetaminophen  1,000 mg Oral Q6H  . Chlorhexidine Gluconate Cloth  6 each Topical Daily  . docusate sodium  100 mg Oral BID  . gabapentin  300 mg Oral TID  . methocarbamol  1,000 mg Oral Q8H  . sodium chloride flush  10-40 mL Intracatheter Q12H   Continuous Infusions: . ampicillin-sulbactam (UNASYN) IV 3 g (01/16/20 0915)  . heparin 1,200 Units/hr (01/16/20 0551)  . methocarbamol (ROBAXIN) IV       LOS: 5 days     Cordelia Poche, MD [facility] 01/16/2020, 11:39 AM  If 7PM-7AM, please contact  night-coverage [email or web address]

## 2020-01-16 NOTE — Progress Notes (Signed)
Ortho Trauma Note  Still having a lot of pain in knee.  LLE: Incision wound vac with minimal output. Does not tolerate ROM of knee or leg. Swelling appropriate  A/P L knee septic arthritis  Continue with IV anbiotics Dr. Sharol Given to consult Will add a single dose of dilaudid IV 2mg  before bed to assist with pain for sleeping Continue heparin drip for PE  Shona Needles, MD [facility] [phone] (office) [email or web address]

## 2020-01-16 NOTE — Progress Notes (Addendum)
Progress Note  Patient Name: GEORGES VICTORIO Date of Encounter: 01/16/2020  [facility] Cardiologist: Sanda Klein, MD   Subjective   Pt complains of PND and left sided chest pain. O2 sat normal.  Inpatient Medications    Scheduled Meds: . sodium chloride   Intravenous Once  . acetaminophen  1,000 mg Oral Q6H  . Chlorhexidine Gluconate Cloth  6 each Topical Daily  . docusate sodium  100 mg Oral BID  . gabapentin  300 mg Oral TID  . methocarbamol  1,000 mg Oral Q8H   Continuous Infusions: . ampicillin-sulbactam (UNASYN) IV 3 g (01/16/20 0309)  . heparin 1,200 Units/hr (01/16/20 0551)  . methocarbamol (ROBAXIN) IV     PRN Meds: HYDROmorphone (DILAUDID) injection, ondansetron **OR** ondansetron (ZOFRAN) IV, oxyCODONE, oxyCODONE, polyethylene glycol, zinc oxide   Vital Signs    Vitals:   01/15/20 0716 01/15/20 1449 01/15/20 1945 01/16/20 0615  BP:  129/88 139/88 (!) 150/85  Pulse:   100   Resp: 18 18 20 18   Temp: 98.2 F (36.8 C) 98.2 F (36.8 C) 98.1 F (36.7 C)   TempSrc: Oral Oral Oral   SpO2:   97%   Weight:      Height:        Intake/Output Summary (Last 24 hours) at 01/16/2020 0755 Last data filed at 01/15/2020 2000 Gross per 24 hour  Intake 170 ml  Output 0 ml  Net 170 ml   Last 3 Weights 01/14/2020 01/12/2020 01/11/2020  Weight (lbs) 175 lb 0.7 oz 180 lb 1.9 oz 191 lb  Weight (kg) 79.4 kg 81.7 kg 86.637 kg      Telemetry    Sinus rhythm HR 70-90s - Personally Reviewed  ECG    No new tracings - Personally Reviewed  Physical Exam   GEN: No acute distress.   Neck: No JVD Cardiac: RRR, no murmurs, rubs, or gallops.  Respiratory: Clear to auscultation bilaterally. GI: Soft, nontender, non-distended  MS: right AKA, dressings/wrap left lower extremity Neuro:  Nonfocal  Psych: Normal affect   Labs    High Sensitivity Troponin:   Recent Labs  Lab 01/11/20 2014 01/12/20 0540 01/12/20 1504 01/15/20 0100 01/15/20 0300  TROPONINIHS  93* 68* 79* 65* 56*      Chemistry Recent Labs  Lab 01/11/20 1816 01/11/20 1816 01/12/20 0540 01/12/20 1504 01/13/20 0308 01/14/20 0543 01/15/20 0300  NA 137   < > 140  --  138 139 139  K 3.7   < > 3.6  --  4.9 4.6 4.1  CL 100   < > 107  --  105 104 103  CO2 21*   < > 23  --  24 26 27   GLUCOSE 81   < > 87  --  207* 180* 196*  BUN 20   < > 15  --  15 14 17   CREATININE 1.09   < > 0.84   < > 0.84 0.94 0.83  CALCIUM 9.3   < > 8.9  --  8.4* 8.6* 8.3*  PROT 8.1  --  7.5  --   --   --   --   ALBUMIN 2.8*  --  2.5*  --   --   --   --   AST 18  --  16  --   --   --   --   ALT 18  --  16  --   --   --   --   ALKPHOS 102  --  84  --   --   --   --   BILITOT 1.5*  --  1.2  --   --   --   --   GFRNONAA >60   < > >60   < > >60 >60 >60  ANIONGAP 16*   < > 10  --  9 9 9    < > = values in this interval not displayed.     Hematology Recent Labs  Lab 01/14/20 2300 01/15/20 0410 01/16/20 0328  WBC 7.7 7.7 7.3  RBC 3.21* 3.23* 3.08*  HGB 7.9* 7.9* 7.8*  HCT 25.4* 26.0* 25.2*  MCV 79.1* 80.5 81.8  MCH 24.6* 24.5* 25.3*  MCHC 31.1 30.4 31.0  RDW 17.3* 17.5* 17.9*  PLT 378 391 378    BNPNo results for input(s): BNP, PROBNP in the last 168 hours.   DDimer  Recent Labs  Lab 01/12/20 0540  DDIMER 9.72*     Radiology    CT ANGIO CHEST PE W OR WO CONTRAST  Result Date: 01/15/2020 CLINICAL DATA:  54 y.o. male with medical history significant of Sp AKA, septir arthritis of left knee, h/o opioid dependence, documented Munchausen syndrome. Patient presented secondary to LOC, chest pain and knee pain. Syncope workup in addition to management of septic arthritis. Elevated D-dimer. EXAM: CT ANGIOGRAPHY CHEST WITH CONTRAST TECHNIQUE: Multidetector CT imaging of the chest was performed using the standard protocol during bolus administration of intravenous contrast. Multiplanar CT image reconstructions and MIPs were obtained to evaluate the vascular anatomy. CONTRAST:  34mL OMNIPAQUE IOHEXOL  350 MG/ML SOLN COMPARISON:  None. FINDINGS: Cardiovascular: There are bilateral pulmonary emboli. On the right, pulmonary emboli are noted in right lower lobe segmental branches and segmental branches to the anterior and apical segments of the right upper lobe. On the left, pulmonary emboli are seen in the distal main pulmonary artery extending into the apical segmental branch to the left upper lobe and into left lower lobe segmental branches. Emboli are nonocclusive. RV/LV ratio is 0.86, no evidence of right ventricular strain. Heart is normal in size. No pericardial effusion. Left coronary artery calcifications. Great vessels are normal in caliber. No aortic dissection or atherosclerosis. Mediastinum/Nodes: No enlarged mediastinal, hilar, or axillary lymph nodes. Thyroid gland, trachea, and esophagus demonstrate no significant findings. Lungs/Pleura: Lungs demonstrate subtle areas of hazy opacity likely due to vascular shunting. No evidence of pneumonia or pulmonary edema. No mass or nodule. No pleural effusion or pneumothorax. Upper Abdomen: No acute or significant abnormalities. Musculoskeletal: Slight upper endplate depressions 3 contiguous midthoracic vertebra and a lower thoracic vertebra all of which appear chronic. No convincing acute fracture. No osteoblastic or osteolytic lesions. Review of the MIP images confirms the above findings. IMPRESSION: 1. Bilateral pulmonary emboli without right heart strain as detailed above. 2. No other acute abnormality. Electronically Signed   By: Lajean Manes M.D.   On: 01/15/2020 11:18   US Guided Needle Placement  Result Date: 01/15/2020 INDICATION: 54 year old male with a history of poor venous access, concern for pulmonary embolism, referred for image guided placement of a peripheral IV EXAM: ULTRASOUND GUIDED VENIPUNCTURE MEDICATIONS: None ANESTHESIA/SEDATION: None FLUOROSCOPY TIME:  Ultrasound COMPLICATIONS: None PROCEDURE: Informed written consent was obtained  from the patient after a thorough discussion of the procedural risks, benefits and alternatives. All questions were addressed. Sterile Barrier Technique was utilized including caps, mask, sterile gloves, sterile drape, hand hygiene and skin antiseptic. A timeout was performed prior to the initiation of the procedure. Ultrasound survey of  the upper extremity was performed with images stored and sent to PACs. Of note, partially chronically thrombosed cephalic and basilic veins of the right upper extremity. Brachial vein is patent A micropuncture needle was used access the right brachial vein under ultrasound. With venous blood flow returned, an .018 micro wire was passed through the needle into the vein. The needle was removed, and a micropuncture sheath was placed over the wire. The inner dilator and wire were removed, and the catheter was secured in position after attaching to saline flush. Patient tolerated the procedure well and remained hemodynamically stable throughout. No complications were encountered and no significant blood loss. IMPRESSION: Status post ultrasound guided venipuncture. Signed, Dulcy Fanny. Dellia Nims, RPVI [facility] Radiology Electronically Signed   By: Corrie Mckusick D.O.   On: 01/15/2020 14:29   DG Chest Port 1 View  Result Date: 01/14/2020 CLINICAL DATA:  Central line placement. EXAM: PORTABLE CHEST 1 VIEW COMPARISON:  January 11, 2020 FINDINGS: Right internal jugular approach central venous catheter terminates at the expected location of the cavoatrial junction. Cardiomediastinal silhouette is normal. Mediastinal contours appear intact. There is no evidence of focal airspace consolidation, pleural effusion or pneumothorax. Osseous structures are without acute abnormality. Soft tissues are grossly normal. IMPRESSION: Right internal jugular approach central venous catheter terminates at the expected location of the cavoatrial junction.  Electronically Signed   By: Fidela Salisbury M.D.   On: 01/14/2020 19:02   ECHOCARDIOGRAM LIMITED  Result Date: 01/15/2020    ECHOCARDIOGRAM LIMITED REPORT   Patient Name:   RHYSE LOUX Date of Exam: 01/15/2020 Medical Rec #:  [id number]      Height:       70.0 in Accession #:    [id number]     Weight:       175.0 lb Date of Birth:  1965/11/29      BSA:          1.972 m Patient Age:    54 years       BP:           138/77 mmHg Patient Gender: M              HR:           90 bpm. Exam Location:  Inpatient Procedure: Limited Echo Indications:    Pulmonary Embolus 415.19 / I26.99  History:        Patient has prior history of Echocardiogram examinations, most                 recent 01/13/2020. Signs/Symptoms:Chest Pain and Syncope; Risk                 Factors:Hypertension.  Sonographer:    Alvino Chapel RCS Referring Phys: [id number] [place]  1. Left ventricular ejection fraction, by estimation, is 60 to 65%. The left ventricle has normal function. The left ventricle has no regional wall motion abnormalities.  2. Right ventricular systolic function is mildly reduced. The right ventricular size is mildly enlarged. Comparison(s): The right ventricular systolic function has improved. FINDINGS  Left Ventricle: Left ventricular ejection fraction, by estimation, is 60 to 65%. The left ventricle has normal function. The left ventricle has no regional wall motion abnormalities. The left ventricular internal cavity size was normal in size. There is  no left ventricular hypertrophy. Right Ventricle: The right ventricular size is mildly enlarged. No increase in right ventricular wall thickness. Right ventricular systolic function is mildly reduced.  Pericardium: There is no evidence of pericardial effusion. Candee Furbish MD Electronically signed by Candee Furbish MD Signature Date/Time: 01/15/2020/4:53:38 PM    Final    [place] EKG SITE RITE  Result Date: 01/14/2020 If Site Rite image not attached, placement  could not be confirmed due to current cardiac rhythm.   Cardiac Studies    Echo 01/13/20: 1. The RV is severely dilated with moderately reduced function. Would  consider acute PE to explain tachycardia, syncope, and elevated troponin.   2. Left ventricular ejection fraction, by estimation, is 60 to 65%. The  left ventricle has normal function. The left ventricle has no regional  wall motion abnormalities. Left ventricular diastolic parameters were  normal. There is the interventricular  septum is flattened in systole, consistent with right ventricular pressure  overload.   3. Right ventricular systolic function is moderately reduced. The right  ventricular size is severely enlarged.   4. The mitral valve is grossly normal. Trivial mitral valve  regurgitation. No evidence of mitral stenosis.   5. The aortic valve is tricuspid. Aortic valve regurgitation is not  visualized. No aortic stenosis is present.    Echo 01/15/20: FINDINGS   Left Ventricle: Left ventricular ejection fraction, by estimation, is 60  to 65%. The left ventricle has normal function. The left ventricle has no  regional wall motion abnormalities. The left ventricular internal cavity  size was normal in size. There is   no left ventricular hypertrophy.   Right Ventricle: The right ventricular size is mildly enlarged. No  increase in right ventricular wall thickness. Right ventricular systolic  function is mildly reduced.   Pericardium: There is no evidence of pericardial effusion.    Left heart cath 2018:  Normal coronary arteries.  Low normal LV function. EF estimated to be 50%.  Continuous chest discomfort during the procedure.   RECOMMENDATIONS:    Chest discomfort is not related to obstructive coronary disease.  Consider pericardial or other nonischemic chest pain etiology    Patient Profile     54 y.o. male with history of HTN, obesity, right AKA, Munchausen syndrome, and normal coronaries  on cath in 2018 who presented with septic knee arthritis and left distal femur/proximal tibia oseto s/p I and D with placement of ABX cement beads and wound vac. Cardiology is consulted for syncope and chest pain.  Assessment & Plan    Syncope Submassive bilateral PE - repeat echo with RV mildly reduced - heparin gtt - will defer to PCCM RE IR for embolectomy or catheter directed lytics - sinus tachycardia has largely resolved  Chest pain - hs troponin 77 --> 93 --> 68 --> 79 --> 65 --> 56 - low and flat, inconsistent with ACS - reassuring heart cath in 2018 - pt continues to complain of left sided chest pain relieved with IV dilaudid  Acute on chronic anemia - Hb stable at 7.8 - Hb dropped to 6.5 01/13/20   Septic knee arthritis - per ortho - planning repeat I&D   For questions or updates, please contact [place] Please consult [email or web address] for contact info under      Signed, Ledora Bottcher, PA  01/16/2020, 7:55 AM    Personally seen and examined. Agree with APP above with the following comments:  54 yo M with prior traumatic right AKA, submassive PE with syncope in the setting of septic knee arthritis, and acute on chronic microcytic anemia.  Patient's RV function appears to have improved, with improvement in his sinus tachycardia.  Anemia is stable on heparin.  Patient has concerns about needing to find out where he is bleeding from.   Exam notable for no bleeding around CVL (Right IJ) and no RV heave. Heart rate sinus at 84 with improvement from prior sinus tach. Hgb stable at  7.8.  RV function has improved from 10/29 (full study) to 10/31 (no TAPSE or S' in limited). Agree with PCCM- given anemia, improvement in HR, and left knee, may not be ideal candidate for EKOS/FlowTriver/Catheter based PE intervention. Would continue heparin and plan for outpatient DOAC after surgical evaluation. Discussed with patient who understands the risks and is amenable to moore  conservative therapy  Will sign off at this time; happy to re-engage as need issues arise.  Werner Lean, MD

## 2020-01-16 NOTE — Progress Notes (Signed)
[facility]   Reason for visit: Follow up on septic arthritis  Interval History: growth in culture with Provetella denticola; afebrile.  WBC wnl.  No associated rash or diarrhea. Events over the weekend noted: new PE.   Plan for repeat surgery Wed   Physical Exam: Constitutional:  Vitals:   01/15/20 1945 01/16/20 0615  BP: 139/88 (!) 150/85  Pulse: 100   Resp: 20 18  Temp: 98.1 F (36.7 C)   SpO2: 97%    patient appears in NAD Respiratory: Normal respiratory effort; CTA B Cardiovascular: RRR GI: soft, nt, nd  Review of Systems: Constitutional: negative for fevers and chills Gastrointestinal: negative for nausea and diarrhea Integument/breast: negative for rash  Lab Results  Component Value Date   WBC 7.3 01/16/2020   HGB 7.8 (L) 01/16/2020   HCT 25.2 (L) 01/16/2020   MCV 81.8 01/16/2020   PLT 378 01/16/2020    Lab Results  Component Value Date   CREATININE 0.83 01/15/2020   BUN 17 01/15/2020   NA 139 01/15/2020   K 4.1 01/15/2020   CL 103 01/15/2020   CO2 27 01/15/2020    Lab Results  Component Value Date   ALT 16 01/12/2020   AST 16 01/12/2020   ALKPHOS 84 01/12/2020     Microbiology: Recent Results (from the past 240 hour(s))  Respiratory Panel by RT PCR (Flu A&B, Covid) - Nasopharyngeal Swab     Status: None   Collection Time: 01/11/20  8:14 PM   Specimen: Nasopharyngeal Swab  Result Value Ref Range Status   SARS Coronavirus 2 by RT PCR NEGATIVE NEGATIVE Final    Comment: (NOTE) SARS-CoV-2 target nucleic acids are NOT DETECTED.  The SARS-CoV-2 RNA is generally detectable in upper respiratoy specimens during the acute phase of infection. The lowest concentration of SARS-CoV-2 viral copies this assay can detect is 131 copies/mL. A negative result does not preclude SARS-Cov-2 infection and should not be used as the sole basis for treatment or other patient management decisions. A negative result may occur with    improper specimen collection/handling, submission of specimen other than nasopharyngeal swab, presence of viral mutation(s) within the areas targeted by this assay, and inadequate number of viral copies (<131 copies/mL). A negative result must be combined with clinical observations, patient history, and epidemiological information. The expected result is Negative.  Fact Sheet for Patients:  PinkCheek.be  Fact Sheet for Healthcare Providers:  GravelBags.it  This test is no t yet approved or cleared by the [place] FDA and  has been authorized for detection and/or diagnosis of SARS-CoV-2 by FDA under an Emergency Use Authorization (EUA). This EUA will remain  in effect (meaning this test can be used) for the duration of the COVID-19 declaration under Section 564(b)(1) of the Act, 21 U.S.C. section 360bbb-3(b)(1), unless the authorization is terminated or revoked sooner.     Influenza A by PCR NEGATIVE NEGATIVE Final   Influenza B by PCR NEGATIVE NEGATIVE Final    Comment: (NOTE) The Xpert Xpress SARS-CoV-2/FLU/RSV assay is intended as an aid in  the diagnosis of influenza from Nasopharyngeal swab specimens and  should not be used as a sole basis for treatment. Nasal washings and  aspirates are unacceptable for Xpert Xpress SARS-CoV-2/FLU/RSV  testing.  Fact Sheet for Patients: PinkCheek.be  Fact Sheet for Healthcare Providers: GravelBags.it  This test is not yet approved or cleared by the [place] FDA and  has been authorized for detection and/or diagnosis of  SARS-CoV-2 by  FDA under an Emergency Use Authorization (EUA). This EUA will remain  in effect (meaning this test can be used) for the duration of the  Covid-19 declaration under Section 564(b)(1) of the Act, 21  U.S.C. section 360bbb-3(b)(1), unless the authorization is  terminated or  revoked. Performed at [facility], [place] [address], [place], [place] [address]   Culture, blood (Routine X 2) w Reflex to ID Panel     Status: None (Preliminary result)   Collection Time: 01/12/20  5:39 AM   Specimen: BLOOD LEFT HAND  Result Value Ref Range Status   Specimen Description BLOOD LEFT HAND  Final   Special Requests   Final    BOTTLES DRAWN AEROBIC AND ANAEROBIC Blood Culture adequate volume   Culture   Final    NO GROWTH 4 DAYS Performed at [facility], [place] [address] [place], [place] [address]    Report Status PENDING  Incomplete  Surgical pcr screen     Status: None   Collection Time: 01/12/20 10:31 AM   Specimen: Nasal Mucosa; Nasal Swab  Result Value Ref Range Status   MRSA, PCR NEGATIVE NEGATIVE Final   Staphylococcus aureus NEGATIVE NEGATIVE Final    Comment: (NOTE) The Xpert SA Assay (FDA approved for NASAL specimens in patients 39 years of age and older), is one component of a comprehensive surveillance program. It is not intended to diagnose infection nor to guide or monitor treatment. Performed at [facility], [place] [address], [place], [place] [address]   Aerobic/Anaerobic Culture (surgical/deep wound)     Status: None   Collection Time: 01/12/20 12:31 PM   Specimen: PATH Other; Tissue  Result Value Ref Range Status   Specimen Description TISSUE  Final   Special Requests LEFT KNEE PUSS SPEC A  Final   Gram Stain   Final    MODERATE WBC PRESENT, PREDOMINANTLY PMN FEW GRAM NEGATIVE RODS    Culture   Final    FEW PREVOTELLA DENTICOLA BETA LACTAMASE POSITIVE Performed at [facility], [place] [address] [place], [place] [address]    Report Status 01/15/2020 FINAL  Final  Culture, blood (Routine X 2) w Reflex to ID Panel     Status: None (Preliminary result)   Collection Time: 01/12/20  3:04 PM   Specimen: BLOOD  Result Value Ref Range Status   Specimen Description BLOOD LEFT ANTECUBITAL  Final   Special Requests   Final     BOTTLES DRAWN AEROBIC AND ANAEROBIC Blood Culture adequate volume   Culture   Final    NO GROWTH 4 DAYS Performed at [facility], [place] [address] [place], [place] [address]    Report Status PENDING  Incomplete    Impression/Plan:  1. Septic arthritis - bacteria c/w oral source which is consistent with previous cultures as oral source - previously Strep anginosus, gemella haemolysans, Strep salivarius, mitis/ Also Provetella in operative cultures from 10/8 at OSH.   Will continue with IV amp/sulbactam while inpatient then transition to oral Augmentin at discharge.   2.  PE - new finding and followed by pulmonary now.    Will follow up again after surgery.

## 2020-01-16 NOTE — Progress Notes (Signed)
ANTICOAGULATION CONSULT NOTE - Follow Up Consult  Pharmacy Consult for heparin Indication: pulmonary embolus   Labs: Recent Labs    01/14/20 0543 01/14/20 0543 01/14/20 0956 01/14/20 2300 01/14/20 2300 01/15/20 0100 01/15/20 0300 01/15/20 0410 01/16/20 0328  HGB 9.0*   < >  --  7.9*   < >  --   --  7.9* 7.8*  HCT 28.9*   < >  --  25.4*  --   --   --  26.0* 25.2*  PLT 403*   < >  --  378  --   --   --  391 378  HEPARINUNFRC <0.10*  --  0.12*  --   --   --   --   --  1.02*  CREATININE 0.94  --   --   --   --   --  0.83  --   --   TROPONINIHS  --   --   --   --   --  65* 56*  --   --    < > = values in this interval not displayed.    Assessment: 54yo male supratherapeutic on heparin after resumed though LMWH may still be contributing to level; no gtt issues or signs of bleeding per RN.  Goal of Therapy:  Heparin level 0.3-0.7 units/ml   Plan:  Will decrease heparin gtt by 2 units/kg/hr to 1200 units/hr and check level in 6 hours.    Wynona Neat, PharmD, BCPS  01/16/2020,4:54 AM

## 2020-01-17 DIAGNOSIS — Z89611 Acquired absence of right leg above knee: Secondary | ICD-10-CM | POA: Diagnosis not present

## 2020-01-17 DIAGNOSIS — I10 Essential (primary) hypertension: Secondary | ICD-10-CM | POA: Diagnosis not present

## 2020-01-17 DIAGNOSIS — R778 Other specified abnormalities of plasma proteins: Secondary | ICD-10-CM | POA: Diagnosis not present

## 2020-01-17 DIAGNOSIS — R55 Syncope and collapse: Secondary | ICD-10-CM | POA: Diagnosis not present

## 2020-01-17 DIAGNOSIS — I2699 Other pulmonary embolism without acute cor pulmonale: Secondary | ICD-10-CM | POA: Diagnosis not present

## 2020-01-17 LAB — CBC
HCT: 28 % — ABNORMAL LOW (ref 39.0–52.0)
Hemoglobin: 8.4 g/dL — ABNORMAL LOW (ref 13.0–17.0)
MCH: 24.7 pg — ABNORMAL LOW (ref 26.0–34.0)
MCHC: 30 g/dL (ref 30.0–36.0)
MCV: 82.4 fL (ref 80.0–100.0)
Platelets: 358 10*3/uL (ref 150–400)
RBC: 3.4 MIL/uL — ABNORMAL LOW (ref 4.22–5.81)
RDW: 18.5 % — ABNORMAL HIGH (ref 11.5–15.5)
WBC: 9.1 10*3/uL (ref 4.0–10.5)
nRBC: 0 % (ref 0.0–0.2)

## 2020-01-17 LAB — CULTURE, BLOOD (ROUTINE X 2)
Culture: NO GROWTH
Culture: NO GROWTH
Special Requests: ADEQUATE
Special Requests: ADEQUATE

## 2020-01-17 LAB — HEPARIN LEVEL (UNFRACTIONATED)
Heparin Unfractionated: 0.23 IU/mL — ABNORMAL LOW (ref 0.30–0.70)
Heparin Unfractionated: 0.16 IU/mL — ABNORMAL LOW (ref 0.30–0.70)
Heparin Unfractionated: 0.35 IU/mL (ref 0.30–0.70)

## 2020-01-17 NOTE — Progress Notes (Signed)
[facility] Infectious [facility]   Reason for visit: Follow up on septic arthritis  Interval History: no new growth; WBC wnl, afebrile.   Total antibiotics day 6 Ampicillin/sulbactam day 5   Physical Exam: Constitutional:  Vitals:   01/16/20 1828 01/17/20 0500  BP: (!) 158/88 129/75  Pulse:  76  Resp:  18  Temp:  98.2 F (36.8 C)  SpO2: 97% 99%   patient appears in NAD Respiratory: Normal respiratory effort; CTA B Cardiovascular: RRR  Review of Systems: Constitutional: negative for fevers and chills Gastrointestinal: negative for nausea and diarrhea Integument/breast: negative for rash  Lab Results  Component Value Date   WBC 9.1 01/17/2020   HGB 8.4 (L) 01/17/2020   HCT 28.0 (L) 01/17/2020   MCV 82.4 01/17/2020   PLT 358 01/17/2020    Lab Results  Component Value Date   CREATININE 0.83 01/15/2020   BUN 17 01/15/2020   NA 139 01/15/2020   K 4.1 01/15/2020   CL 103 01/15/2020   CO2 27 01/15/2020    Lab Results  Component Value Date   ALT 16 01/12/2020   AST 16 01/12/2020   ALKPHOS 84 01/12/2020     Microbiology: Recent Results (from the past 240 hour(s))  Respiratory Panel by RT PCR (Flu A&B, Covid) - Nasopharyngeal Swab     Status: None   Collection Time: 01/11/20  8:14 PM   Specimen: Nasopharyngeal Swab  Result Value Ref Range Status   SARS Coronavirus 2 by RT PCR NEGATIVE NEGATIVE Final    Comment: (NOTE) SARS-CoV-2 target nucleic acids are NOT DETECTED.  The SARS-CoV-2 RNA is generally detectable in upper respiratoy specimens during the acute phase of infection. The lowest concentration of SARS-CoV-2 viral copies this assay can detect is 131 copies/mL. A negative result does not preclude SARS-Cov-2 infection and should not be used as the sole basis for treatment or other patient management decisions. A negative result may occur with  improper specimen collection/handling, submission of specimen other than nasopharyngeal swab, presence  of viral mutation(s) within the areas targeted by this assay, and inadequate number of viral copies (<131 copies/mL). A negative result must be combined with clinical observations, patient history, and epidemiological information. The expected result is Negative.  Fact Sheet for Patients:  PinkCheek.be  Fact Sheet for Healthcare Providers:  GravelBags.it  This test is no t yet approved or cleared by the [place] FDA and  has been authorized for detection and/or diagnosis of SARS-CoV-2 by FDA under an Emergency Use Authorization (EUA). This EUA will remain  in effect (meaning this test can be used) for the duration of the COVID-19 declaration under Section 564(b)(1) of the Act, 21 U.S.C. section 360bbb-3(b)(1), unless the authorization is terminated or revoked sooner.     Influenza A by PCR NEGATIVE NEGATIVE Final   Influenza B by PCR NEGATIVE NEGATIVE Final    Comment: (NOTE) The Xpert Xpress SARS-CoV-2/FLU/RSV assay is intended as an aid in  the diagnosis of influenza from Nasopharyngeal swab specimens and  should not be used as a sole basis for treatment. Nasal washings and  aspirates are unacceptable for Xpert Xpress SARS-CoV-2/FLU/RSV  testing.  Fact Sheet for Patients: PinkCheek.be  Fact Sheet for Healthcare Providers: GravelBags.it  This test is not yet approved or cleared by the [place] FDA and  has been authorized for detection and/or diagnosis of SARS-CoV-2 by  FDA under an Emergency Use Authorization (EUA). This EUA will remain  in effect (meaning this test  can be used) for the duration of the  Covid-19 declaration under Section 564(b)(1) of the Act, 21  U.S.C. section 360bbb-3(b)(1), unless the authorization is  terminated or revoked. Performed at [facility], [place] [address] [place], [place] [address]   Culture, blood (Routine X  2) w Reflex to ID Panel     Status: None   Collection Time: 01/12/20  5:39 AM   Specimen: BLOOD LEFT HAND  Result Value Ref Range Status   Specimen Description BLOOD LEFT HAND  Final   Special Requests   Final    BOTTLES DRAWN AEROBIC AND ANAEROBIC Blood Culture adequate volume   Culture   Final    NO GROWTH 5 DAYS Performed at [facility], [place] [address], [place], [place] [address]    Report Status 01/17/2020 FINAL  Final  Surgical pcr screen     Status: None   Collection Time: 01/12/20 10:31 AM   Specimen: Nasal Mucosa; Nasal Swab  Result Value Ref Range Status   MRSA, PCR NEGATIVE NEGATIVE Final   Staphylococcus aureus NEGATIVE NEGATIVE Final    Comment: (NOTE) The Xpert SA Assay (FDA approved for NASAL specimens in patients 15 years of age and older), is one component of a comprehensive surveillance program. It is not intended to diagnose infection nor to guide or monitor treatment. Performed at [facility], [place] [address], [place], [place] [address]   Aerobic/Anaerobic Culture (surgical/deep wound)     Status: None   Collection Time: 01/12/20 12:31 PM   Specimen: PATH Other; Tissue  Result Value Ref Range Status   Specimen Description TISSUE  Final   Special Requests LEFT KNEE PUSS SPEC A  Final   Gram Stain   Final    MODERATE WBC PRESENT, PREDOMINANTLY PMN FEW GRAM NEGATIVE RODS    Culture   Final    FEW PREVOTELLA DENTICOLA BETA LACTAMASE POSITIVE Performed at [facility], [place] [address] [place], [place] [address]    Report Status 01/15/2020 FINAL  Final  Culture, blood (Routine X 2) w Reflex to ID Panel     Status: None   Collection Time: 01/12/20  3:04 PM   Specimen: BLOOD  Result Value Ref Range Status   Specimen Description BLOOD LEFT ANTECUBITAL  Final   Special Requests   Final    BOTTLES DRAWN AEROBIC AND ANAEROBIC Blood Culture adequate volume   Culture   Final    NO GROWTH 5 DAYS Performed at [facility], [place]  [address], [place], [place] [address]    Report Status 01/17/2020 FINAL  Final    Impression/Plan:  1. Septic arthritis - he will continue on ampicillin/sulbactam and plan for Augmentin at discharge.    2.  PE - continuing on anticoagulation.    Will follow up again after surgery.

## 2020-01-17 NOTE — Progress Notes (Signed)
Physical Therapy Treatment Patient Details Name: Jason [name] MRN: [id number] DOB: 05-29-1965 Today's Date: 01/17/2020    History of Present Illness Pt adm lt septic knee arthritis and lt distal femur/proximal tibia osteomyelitis. On 10/28 underwent I&D of lt knee and lt anterior thigh abscess and placement of antibiotic beads and cement. PMH - Rt AKA, rt thr, Munchausen syndrome, HTN, anxiety, arthritis, multiple shoulder surgeries.     PT Comments    Pt supine in bed on arrival.  He was able to progress to long sitting this session and perform lateral scoot to R and L in long sitting position.  He refused further ROM to LLE and refused to sit edge of bed.  Continue to recommend snf placement at this time and will reduce frequency to 2x/wk.      Follow Up Recommendations  SNF     Equipment Recommendations  None recommended by PT    Recommendations for Other Services       Precautions / Restrictions Precautions Precautions: Fall Restrictions Weight Bearing Restrictions: Yes LLE Weight Bearing: Weight bearing as tolerated Other Position/Activity Restrictions: wound vac L knee.    Mobility  Bed Mobility Overal bed mobility: Needs Assistance Bed Mobility: Supine to Sit     Supine to sit: Supervision     General bed mobility comments: Pt able to come supine to long sitting with supervision but refused to move to edge of bed and let L knee dangle due to pain.  Performed additional x5 reps for strengthening core and UEs.  Transfers Overall transfer level:  (refused)                  Ambulation/Gait                 Stairs             Wheelchair Mobility    Modified Rankin (Stroke Patients Only)       Balance Overall balance assessment: Mild deficits observed, not formally tested (for long sitting only required B UE support to maintain sitting.)                                          Cognition Arousal/Alertness:  Awake/alert Behavior During Therapy: WFL for tasks assessed/performed Overall Cognitive Status: Within Functional Limits for tasks assessed                                        Exercises General Exercises - Lower Extremity Ankle Circles/Pumps: AROM;Left;10 reps;Supine Quad Sets: AROM;Left;10 reps;Supine    General Comments        Pertinent Vitals/Pain Pain Assessment: 0-10 Pain Score: 8  Pain Location: lt knee and leg Pain Descriptors / Indicators: Guarding;Grimacing Pain Intervention(s): Monitored during session;Repositioned    Home Living                      Prior Function            PT Goals (current goals can now be found in the care plan section) Acute Rehab PT Goals Patient Stated Goal: I need better pain management Potential to Achieve Goals: Fair Progress towards PT goals: Progressing toward goals    Frequency    Min 2X/week      PT Plan Discharge plan needs  to be updated    Co-evaluation              AM-PAC PT "6 Clicks" Mobility   Outcome Measure  Help needed turning from your back to your side while in a flat bed without using bedrails?: A Little Help needed moving from lying on your back to sitting on the side of a flat bed without using bedrails?: None Help needed moving to and from a bed to a chair (including a wheelchair)?: Total Help needed standing up from a chair using your arms (e.g., wheelchair or bedside chair)?: Total Help needed to walk in hospital room?: Total Help needed climbing 3-5 steps with a railing? : Total 6 Click Score: 11    End of Session   Activity Tolerance: Patient limited by pain (behavior) Patient left: in bed;with call bell/phone within reach;with bed alarm set Nurse Communication: Mobility status PT Visit Diagnosis: Other abnormalities of gait and mobility (R26.89);Pain Pain - Right/Left: Left Pain - part of body: Knee;Leg     Time: 1648-1700 PT Time Calculation (min)  (ACUTE ONLY): 12 min  Charges:  $Therapeutic Activity: 8-22 mins                     [name] , PTA Acute Rehabilitation Services Pager [phone] Office [phone]     Philander Ake Eli Hose 01/17/2020, 5:08 PM

## 2020-01-17 NOTE — Progress Notes (Signed)
ANTICOAGULATION CONSULT NOTE - Follow Up Consult  Pharmacy Consult for heparin Indication: pulmonary embolus   Labs: Recent Labs    01/14/20 0543 01/14/20 0956 01/14/20 2300 01/15/20 0100 01/15/20 0300 01/15/20 0410 01/16/20 0328 01/16/20 1250 01/17/20 0434  HGB 9.0*   < >   < >  --   --  7.9* 7.8*  --  8.4*  HCT 28.9*   < >   < >  --   --  26.0* 25.2*  --  28.0*  PLT 403*   < >   < >  --   --  391 378  --  358  HEPARINUNFRC <0.10*   < >  --   --   --   --  1.02* 0.45 0.23*  CREATININE 0.94  --   --   --  0.83  --   --   --   --   TROPONINIHS  --   --   --  65* 56*  --   --   --   --    < > = values in this interval not displayed.    Assessment: 54yo male supratherapeutic on heparin after one level at goal; no gtt issues or signs of bleeding per RN.  Goal of Therapy:  Heparin level 0.3-0.7 units/ml   Plan:  Will increase heparin gtt by 1-2 unit/kg/hr to 1300 units/hr and check level in 6 hours.    Wynona Neat, PharmD, BCPS  01/17/2020,5:23 AM

## 2020-01-17 NOTE — Progress Notes (Signed)
[facility] for heparin Indication: pulmonary embolus  Allergies  Allergen Reactions  . Ivp Dye [Iodinated Diagnostic Agents] Anaphylaxis    Can be pre-treated with Benadryl  . Metrizamide Anaphylaxis  . Other Other (See Comments)    Under no circumstances will the patient agree to a PICC line  . Reglan [Metoclopramide] Anaphylaxis  . Shellfish-Derived Products Anaphylaxis  . Codeine Hives  . Lidocaine Other (See Comments) and Rash    Pt not sure if this is an actual allergy - might have been a one time incident  . Methadone Hives  . Morphine Swelling and Rash    Local reaction to IV being pushed too fast  . Propoxyphene Hives  . Toradol [Ketorolac Tromethamine] Itching    Patient having systemic itching after receiving IV dose  . Iodine Rash    Reports localized reaction at IV site. Able to tolerate with Benadryl.   . Tape Rash  . Vancomycin Rash    Has had vancomycin since this reaction and had no reaction at all    Patient Measurements: Height: 5\' 10"  (177.8 cm) Weight: 79.4 kg (175 lb 0.7 oz) IBW/kg (Calculated) : 73 Heparin Dosing Weight: 79.4 kg  Vital Signs: Temp: 98.7 F (37.1 C) (11/02 2002) Temp Source: Oral (11/02 2002) BP: 146/81 (11/02 2002) Pulse Rate: 73 (11/02 2002)  Labs: Recent Labs    01/14/20 2300 01/15/20 0100 01/15/20 0300 01/15/20 0410 01/15/20 0410 01/16/20 0328 01/16/20 1250 01/17/20 0434 01/17/20 1230 01/17/20 2000  HGB   < >  --   --  7.9*   < > 7.8*  --  8.4*  --   --   HCT   < >  --   --  26.0*  --  25.2*  --  28.0*  --   --   PLT   < >  --   --  391  --  378  --  358  --   --   HEPARINUNFRC  --   --   --   --   --  1.02*   < > 0.23* 0.16* 0.35  CREATININE  --   --  0.83  --   --   --   --   --   --   --   TROPONINIHS  --  65* 56*  --   --   --   --   --   --   --    < > = values in this interval not displayed.    Estimated Creatinine Clearance: 108.7 mL/min (by C-G formula based on SCr of  0.83 mg/dL).   Medical History: Past Medical History:  Diagnosis Date  . Allergy   . Anxiety   . Arthritis    left shoulder  . Bronchitis   . Chest pain 07/14/2016  . Complication of anesthesia   . DJD (degenerative joint disease) 11/29/2011  . Hypertension   . Neuromuscular disorder ([place])    carpal tunnel bilateral, ulner nerve surgery  . PONV (postoperative nausea and vomiting)   . Septic arthritis of knee, right ([place]) 11/29/2011  . Spinal headache    "long time ago"   Assessment: 54 yr old male POD#2 I&D L knee and L anterior thigh abscess. Pharmacy consulted 10/30 to switch IV heparin to Lovenox for possible pulmonary embolism due to difficult IV access, need for central line placement and CTA.   10/31: CTA showed bilateral non-occlusive PE. Pharmacy now consulted to switch  back to heparin with plans to return to OR for repeat I&D.    No bleeding noted.  Heparin drip 1550 uts/hr HL 0.35 at goal   Goal of Therapy:   Heparin level 0.3-0.7 units/mL Monitor platelets by anticoagulation protocol: Yes   Plan:  Continue heparin 1550 units/hr Daily heparin level, cbc Plan for surgery 11/3    Bonnita Nasuti Pharm.D. CPP, BCPS Clinical Pharmacist [phone] 01/17/2020 9:08 PM

## 2020-01-17 NOTE — Progress Notes (Signed)
PROGRESS NOTE    [name]  FTD:[id number] DOB: [birth year] DOA: 01/11/2020 PCP: Patient, No Pcp Per   Brief Narrative: Jason [name] is a 54 y.o. male with medical history significant of  Sp AKA, septir arthritis of left knee, h/o opioid dependence,documented Munchausen syndrome. Patient presented secondary to LOC, chest pain and knee pain. Syncope workup in addition to management of septic arthritis.   Assessment & Plan:   Principal Problem:   Bilateral pulmonary embolism (HCC) Active Problems:   Essential hypertension   S/P AKA (above knee amputation) unilateral, right (HCC)   Other chronic pain   Septic arthritis (HCC)   Syncope   Elevated troponin   Chest pain   Bright red blood per rectum   Anemia   Encounter for central line placement   Chest pain Present on admission and has improved. Cardiology consulted. HS troponin elevated: 77>93>68. Elevated D-dimer of 9.72 and Transthoracic Echocardiogram significant for RV dilation with concern for possible PE. No chest pain currently. On room air. Negative venous duplex for DVT. PE confirmed on CTA chest. Patient still with some pleuritic chest pain. -Telemetry  Bilateral PEs Submassive PE Non-occlusive but it is possible that with treatment clots may have been more centrally located. Left side includes distal main artery with segmental clots in upper/lower lobe in addition to segmental clots on right in upper/lower lobe. No heart strain per CT but initial Transthoracic Echocardiogram with moderate RV dilation and reduced function. Repeat Transthoracic Echocardiogram significant for improved RV. -Pulmonology recommendations: heparin drip. No targeted therapy secondary to improved RV function on repeat Transthoracic Echocardiogram. -Heparin drip; transition to NOAC after completion of planned surgical procedures  Syncope and collapse Likely secondary to PE.  -Management above  Septic arthritis Recurring issue. Per  chart review, patient was previously managed at [facility] with concern patient may have Munchausen and is possibly inoculating himself. Previous cultures (10/3) from OSH significant for significant for strep mitis/oralis/anginosus/parasnguinis with blood culture significant for strep anginosus/strepp salivarius/gemella haemolysans (as mentioned in H&P) with susceptibilities to penicillin. Per patient he received two arthrocenteses while at [place] surgery consulted and patient underwent I&D on 10/28 in the OR with vancomycin/tobramycin cement beads placed. ID consulted by orthopedic surgery on 10/28. Started on Unasyn per ID -ID recommendations: Unasyn IV -Orthopedic surgery recommendations: pain management and plans for re-exploration  Bright red blood per rectum FOBT negative x1. Hemoglobin tended down slightly from admission. Required 2 units of PRBC. Currently stable. -FOBT x2 more  Acute on chronic Anemia Unknown etiology. Baseline appears to be between 8-9. Worsened hemoglobin with a low of 6.5 on 10/29. Ordered 2 units of PRBC but he received 1.5 secondary to IV infiltration of the second unit. Resultant rebound hemoglobin of 9.0 which has stabilized to about 7.9.    DVT prophylaxis: SCDs Code Status:   Code Status: Full Code Family Communication: None at bedside Disposition Plan: Discharge home in several days pending orthopedic surgery management and ID recommendations for antibiotics   Consultants:   Orthopedic surgery  Cardiology  Pulmonology  Infectious  disease  Procedures:   INCISION & DRAINAGE (10/28) 1. Incision and drainage of left septic knee 2. Incision and drainage of left anterior thigh abscess 3. Placement of antibiotic cement  4. Incisional wound vac placement   TRANSTHORACIC ECHOCARDIOGRAM (10/29) IMPRESSIONS    1. The RV is severely dilated with moderately reduced function. Would  consider acute PE to explain tachycardia,  syncope, and elevated troponin.  2.  Left ventricular ejection fraction, by estimation, is 60 to 65%. The  left ventricle has normal function. The left ventricle has no regional  wall motion abnormalities. Left ventricular diastolic parameters were  normal. There is the interventricular  septum is flattened in systole, consistent with right ventricular pressure  overload.  3. Right ventricular systolic function is moderately reduced. The right  ventricular size is severely enlarged.  4. The mitral valve is grossly normal. Trivial mitral valve  regurgitation. No evidence of mitral stenosis.  5. The aortic valve is tricuspid. Aortic valve regurgitation is not  visualized. No aortic stenosis is present.   Conclusion(s)/Recommendation(s): Findings consistent with Cor Pulmonale.   TRANSTHORACIC ECHOCARDIOGRAM (10/31) IMPRESSIONS    1. Left ventricular ejection fraction, by estimation, is 60 to 65%. The  left ventricle has normal function. The left ventricle has no regional  wall motion abnormalities.  2. Right ventricular systolic function is mildly reduced. The right  ventricular size is mildly enlarged.   Comparison(s): The right ventricular systolic function has improved.  Antimicrobials:  Unasyn   Subjective: Some pain last night. No other issues.  Objective: Vitals:   01/16/20 0615 01/16/20 1757 01/16/20 1828 01/17/20 0500  BP: (!) 150/85 (!) 162/106 (!) 158/88 129/75  Pulse:  93  76  Resp: 18 18  18   Temp:  98.7 F (37.1 C)  98.2 F (36.8 C)  TempSrc:  Oral  Oral  SpO2:  99% 97% 99%  Weight:      Height:        Intake/Output Summary (Last 24 hours) at 01/17/2020 1037 Last data filed at 01/17/2020 0330 Gross per 24 hour  Intake --  Output 1350 ml  Net -1350 ml   Filed Weights   01/11/20 1749 01/12/20 0600 01/14/20 0326  Weight: 86.6 kg 81.7 kg 79.4 kg    Examination:  General exam: Appears calm and comfortable Respiratory system: Clear to  auscultation. Respiratory effort normal. Cardiovascular system: S1 & S2 heard, RRR. No murmurs, rubs, gallops or clicks. Gastrointestinal system: Abdomen is nondistended, soft and nontender. No organomegaly or masses felt. Normal bowel sounds heard. Central nervous system: Alert and oriented. No focal neurological deficits. Musculoskeletal: Right AKA. Skin: No cyanosis. No rashes Psychiatry: Judgement and insight appear normal. Mood & affect appropriate.     Data Reviewed: I have personally reviewed following labs and imaging studies  CBC Lab Results  Component Value Date   WBC 9.1 01/17/2020   RBC 3.40 (L) 01/17/2020   HGB 8.4 (L) 01/17/2020   HCT 28.0 (L) 01/17/2020   MCV 82.4 01/17/2020   MCH 24.7 (L) 01/17/2020   PLT 358 01/17/2020   MCHC 30.0 01/17/2020   RDW 18.5 (H) 01/17/2020   LYMPHSABS 1.1 01/12/2020   MONOABS 0.8 01/12/2020   EOSABS 0.2 01/12/2020   BASOSABS 0.0 40/10/6759     Last metabolic panel Lab Results  Component Value Date   NA 139 01/15/2020   K 4.1 01/15/2020   CL 103 01/15/2020   CO2 27 01/15/2020   BUN 17 01/15/2020   CREATININE 0.83 01/15/2020   GLUCOSE 196 (H) 01/15/2020   GFRNONAA >60 01/15/2020   GFRAA >60 08/18/2018   CALCIUM 8.3 (L) 01/15/2020   PHOS 3.8 01/12/2020   PROT 7.5 01/12/2020   ALBUMIN 2.5 (L) 01/12/2020   LABGLOB 2.6 08/14/2017   AGRATIO 1.9 08/14/2017   BILITOT 1.2 01/12/2020   ALKPHOS 84 01/12/2020   AST 16 01/12/2020   ALT 16 01/12/2020   ANIONGAP 9 01/15/2020  CBG (last 3)  No results for input(s): GLUCAP in the last 72 hours.   GFR: Estimated Creatinine Clearance: 108.7 mL/min (by C-G formula based on SCr of 0.83 mg/dL).  Coagulation Profile: Recent Labs  Lab 01/12/20 0540  INR 1.2    Recent Results (from the past 240 hour(s))  Respiratory Panel by RT PCR (Flu A&B, Covid) - Nasopharyngeal Swab     Status: None   Collection Time: 01/11/20  8:14 PM   Specimen: Nasopharyngeal Swab  Result Value Ref  Range Status   SARS Coronavirus 2 by RT PCR NEGATIVE NEGATIVE Final    Comment: (NOTE) SARS-CoV-2 target nucleic acids are NOT DETECTED.  The SARS-CoV-2 RNA is generally detectable in upper respiratoy specimens during the acute phase of infection. The lowest concentration of SARS-CoV-2 viral copies this assay can detect is 131 copies/mL. A negative result does not preclude SARS-Cov-2 infection and should not be used as the sole basis for treatment or other patient management decisions. A negative result may occur with  improper specimen collection/handling, submission of specimen other than nasopharyngeal swab, presence of viral mutation(s) within the areas targeted by this assay, and inadequate number of viral copies (<131 copies/mL). A negative result must be combined with clinical observations, patient history, and epidemiological information. The expected result is Negative.  Fact Sheet for Patients:  PinkCheek.be  Fact Sheet for Healthcare Providers:  GravelBags.it  This test is no t yet approved or cleared by the [place] FDA and  has been authorized for detection and/or diagnosis of SARS-CoV-2 by FDA under an Emergency Use Authorization (EUA). This EUA will remain  in effect (meaning this test can be used) for the duration of the COVID-19 declaration under Section 564(b)(1) of the Act, 21 U.S.C. section 360bbb-3(b)(1), unless the authorization is terminated or revoked sooner.     Influenza A by PCR NEGATIVE NEGATIVE Final   Influenza B by PCR NEGATIVE NEGATIVE Final    Comment: (NOTE) The Xpert Xpress SARS-CoV-2/FLU/RSV assay is intended as an aid in  the diagnosis of influenza from Nasopharyngeal swab specimens and  should not be used as a sole basis for treatment. Nasal washings and  aspirates are unacceptable for Xpert Xpress SARS-CoV-2/FLU/RSV  testing.  Fact Sheet for  Patients: PinkCheek.be  Fact Sheet for Healthcare Providers: GravelBags.it  This test is not yet approved or cleared by the [place] FDA and  has been authorized for detection and/or diagnosis of SARS-CoV-2 by  FDA under an Emergency Use Authorization (EUA). This EUA will remain  in effect (meaning this test can be used) for the duration of the  Covid-19 declaration under Section 564(b)(1) of the Act, 21  U.S.C. section 360bbb-3(b)(1), unless the authorization is  terminated or revoked. Performed at [facility], [place] [address], [place], [place] [address]   Culture, blood (Routine X 2) w Reflex to ID Panel     Status: None   Collection Time: 01/12/20  5:39 AM   Specimen: BLOOD LEFT HAND  Result Value Ref Range Status   Specimen Description BLOOD LEFT HAND  Final   Special Requests   Final    BOTTLES DRAWN AEROBIC AND ANAEROBIC Blood Culture adequate volume   Culture   Final    NO GROWTH 5 DAYS Performed at [facility], [place] [address], [place], [place] [address]    Report Status 01/17/2020 FINAL  Final  Surgical pcr screen     Status: None   Collection Time: 01/12/20 10:31 AM  Specimen: Nasal Mucosa; Nasal Swab  Result Value Ref Range Status   MRSA, PCR NEGATIVE NEGATIVE Final   Staphylococcus aureus NEGATIVE NEGATIVE Final    Comment: (NOTE) The Xpert SA Assay (FDA approved for NASAL specimens in patients 61 years of age and older), is one component of a comprehensive surveillance program. It is not intended to diagnose infection nor to guide or monitor treatment. Performed at [facility], [place] [address], [place], [place] [address]   Aerobic/Anaerobic Culture (surgical/deep wound)     Status: None   Collection Time: 01/12/20 12:31 PM   Specimen: PATH Other; Tissue  Result Value Ref Range Status   Specimen Description TISSUE  Final   Special Requests LEFT KNEE PUSS SPEC A  Final   Gram  Stain   Final    MODERATE WBC PRESENT, PREDOMINANTLY PMN FEW GRAM NEGATIVE RODS    Culture   Final    FEW PREVOTELLA DENTICOLA BETA LACTAMASE POSITIVE Performed at [facility], [place] [address], [place], [place] [address]    Report Status 01/15/2020 FINAL  Final  Culture, blood (Routine X 2) w Reflex to ID Panel     Status: None   Collection Time: 01/12/20  3:04 PM   Specimen: BLOOD  Result Value Ref Range Status   Specimen Description BLOOD LEFT ANTECUBITAL  Final   Special Requests   Final    BOTTLES DRAWN AEROBIC AND ANAEROBIC Blood Culture adequate volume   Culture   Final    NO GROWTH 5 DAYS Performed at [facility], [place] [address], [place], [place] [address]    Report Status 01/17/2020 FINAL  Final        Radiology Studies: CT ANGIO CHEST PE W OR WO CONTRAST  Result Date: 01/15/2020 CLINICAL DATA:  54 y.o. male with medical history significant of Sp AKA, septir arthritis of left knee, h/o opioid dependence, documented Munchausen syndrome. Patient presented secondary to LOC, chest pain and knee pain. Syncope workup in addition to management of septic arthritis. Elevated D-dimer. EXAM: CT ANGIOGRAPHY CHEST WITH CONTRAST TECHNIQUE: Multidetector CT imaging of the chest was performed using the standard protocol during bolus administration of intravenous contrast. Multiplanar CT image reconstructions and MIPs were obtained to evaluate the vascular anatomy. CONTRAST:  70mL OMNIPAQUE IOHEXOL 350 MG/ML SOLN COMPARISON:  None. FINDINGS: Cardiovascular: There are bilateral pulmonary emboli. On the right, pulmonary emboli are noted in right lower lobe segmental branches and segmental branches to the anterior and apical segments of the right upper lobe. On the left, pulmonary emboli are seen in the distal main pulmonary artery extending into the apical segmental branch to the left upper lobe and into left lower lobe segmental branches. Emboli are nonocclusive. RV/LV ratio is  0.86, no evidence of right ventricular strain. Heart is normal in size. No pericardial effusion. Left coronary artery calcifications. Great vessels are normal in caliber. No aortic dissection or atherosclerosis. Mediastinum/Nodes: No enlarged mediastinal, hilar, or axillary lymph nodes. Thyroid gland, trachea, and esophagus demonstrate no significant findings. Lungs/Pleura: Lungs demonstrate subtle areas of hazy opacity likely due to vascular shunting. No evidence of pneumonia or pulmonary edema. No mass or nodule. No pleural effusion or pneumothorax. Upper Abdomen: No acute or significant abnormalities. Musculoskeletal: Slight upper endplate depressions 3 contiguous midthoracic vertebra and a lower thoracic vertebra all of which appear chronic. No convincing acute fracture. No osteoblastic or osteolytic lesions. Review of the MIP images confirms the above findings. IMPRESSION: 1. Bilateral pulmonary emboli without  right heart strain as detailed above. 2. No other acute abnormality. Electronically Signed   By: Lajean Manes M.D.   On: 01/15/2020 11:18   ECHOCARDIOGRAM LIMITED  Result Date: 01/15/2020    ECHOCARDIOGRAM LIMITED REPORT   Patient Name:   MIKLO AKEN Date of Exam: 01/15/2020 Medical Rec #:  [id number]      Height:       70.0 in Accession #:    [id number]     Weight:       175.0 lb Date of Birth:  Jul 20, 1965      BSA:          1.972 m Patient Age:    31 years       BP:           138/77 mmHg Patient Gender: M              HR:           90 bpm. Exam Location:  Inpatient Procedure: Limited Echo Indications:    Pulmonary Embolus 415.19 / I26.99  History:        Patient has prior history of Echocardiogram examinations, most                 recent 01/13/2020. Signs/Symptoms:Chest Pain and Syncope; Risk                 Factors:Hypertension.  Sonographer:    Alvino Chapel RCS Referring Phys: [id number] [place]  1. Left ventricular ejection fraction, by estimation, is 60 to 65%. The left  ventricle has normal function. The left ventricle has no regional wall motion abnormalities.  2. Right ventricular systolic function is mildly reduced. The right ventricular size is mildly enlarged. Comparison(s): The right ventricular systolic function has improved. FINDINGS  Left Ventricle: Left ventricular ejection fraction, by estimation, is 60 to 65%. The left ventricle has normal function. The left ventricle has no regional wall motion abnormalities. The left ventricular internal cavity size was normal in size. There is  no left ventricular hypertrophy. Right Ventricle: The right ventricular size is mildly enlarged. No increase in right ventricular wall thickness. Right ventricular systolic function is mildly reduced. Pericardium: There is no evidence of pericardial effusion. Candee Furbish MD Electronically signed by Candee Furbish MD Signature Date/Time: 01/15/2020/4:53:38 PM    Final         Scheduled Meds: . sodium chloride   Intravenous Once  . acetaminophen  1,000 mg Oral Q6H  . Chlorhexidine Gluconate Cloth  6 each Topical Daily  . docusate sodium  100 mg Oral BID  . gabapentin  300 mg Oral TID  . methocarbamol  1,000 mg Oral Q8H  . sodium chloride flush  10-40 mL Intracatheter Q12H   Continuous Infusions: . ampicillin-sulbactam (UNASYN) IV 3 g (01/17/20 0427)  . heparin 1,300 Units/hr (01/17/20 0529)  . methocarbamol (ROBAXIN) IV       LOS: 6 days     Cordelia Poche, MD [facility] 01/17/2020, 10:37 AM  If 7PM-7AM, please contact night-coverage [email or web address]

## 2020-01-17 NOTE — Progress Notes (Signed)
[place] for heparin Indication: pulmonary embolus  Allergies  Allergen Reactions  . Ivp Dye [Iodinated Diagnostic Agents] Anaphylaxis    Can be pre-treated with Benadryl  . Metrizamide Anaphylaxis  . Other Other (See Comments)    Under no circumstances will the patient agree to a PICC line  . Reglan [Metoclopramide] Anaphylaxis  . Shellfish-Derived Products Anaphylaxis  . Codeine Hives  . Lidocaine Other (See Comments) and Rash    Pt not sure if this is an actual allergy - might have been a one time incident  . Methadone Hives  . Morphine Swelling and Rash    Local reaction to IV being pushed too fast  . Propoxyphene Hives  . Toradol [Ketorolac Tromethamine] Itching    Patient having systemic itching after receiving IV dose  . Iodine Rash    Reports localized reaction at IV site. Able to tolerate with Benadryl.   . Tape Rash  . Vancomycin Rash    Has had vancomycin since this reaction and had no reaction at all    Patient Measurements: Height: 5\' 10"  (177.8 cm) Weight: 79.4 kg (175 lb 0.7 oz) IBW/kg (Calculated) : 73 Heparin Dosing Weight: 79.4 kg  Vital Signs: Temp: 98.2 F (36.8 C) (11/02 0500) Temp Source: Oral (11/02 0500) BP: 129/75 (11/02 0500) Pulse Rate: 76 (11/02 0500)  Labs: Recent Labs    01/14/20 0956 01/14/20 2300 01/15/20 0100 01/15/20 0300 01/15/20 0410 01/16/20 0328 01/16/20 1250 01/17/20 0434  HGB   < >   < >  --   --  7.9* 7.8*  --  8.4*  HCT  --    < >  --   --  26.0* 25.2*  --  28.0*  PLT  --    < >  --   --  391 378  --  358  HEPARINUNFRC   < >  --   --   --   --  1.02* 0.45 0.23*  CREATININE  --   --   --  0.83  --   --   --   --   TROPONINIHS  --   --  65* 56*  --   --   --   --    < > = values in this interval not displayed.    Estimated Creatinine Clearance: 108.7 mL/min (by C-G formula based on SCr of 0.83 mg/dL).   Medical History: Past Medical History:  Diagnosis Date  . Allergy    . Anxiety   . Arthritis    left shoulder  . Bronchitis   . Chest pain 07/14/2016  . Complication of anesthesia   . DJD (degenerative joint disease) 11/29/2011  . Hypertension   . Neuromuscular disorder ([place])    carpal tunnel bilateral, ulner nerve surgery  . PONV (postoperative nausea and vomiting)   . Septic arthritis of knee, right ([place]) 11/29/2011  . Spinal headache    "long time ago"   Assessment: 54 yr old male POD#2 I&D L knee and L anterior thigh abscess. Pharmacy consulted 10/30 to switch IV heparin to Lovenox for possible pulmonary embolism due to difficult IV access, need for central line placement and CTA.   10/31: CTA showed bilateral non-occlusive PE. Pharmacy now consulted to switch back to heparin with plans to return to OR for repeat I&D.   Afternoon heparin level now trending down below goal at 0.16. CBC improved overnight. No bleeding noted.   Goal of Therapy:   Heparin  level 0.3-0.7 units/mL Monitor platelets by anticoagulation protocol: Yes   Plan:  Increase heparin to 1550 units/hr Recheck heparin level in 6 hours Plan for surgery 11/3  Erin Hearing PharmD., BCPS Clinical Pharmacist 01/17/2020 7:19 AM

## 2020-01-17 NOTE — Care Management Important Message (Signed)
Important Message  Patient Details  Name: Jason [name] MRN: [id number] Date of Birth: October 01, 1965   Medicare Important Message Given:  Yes     [name] 01/17/2020, 12:03 PM

## 2020-01-18 ENCOUNTER — Inpatient Hospital Stay (HOSPITAL_COMMUNITY): Payer: Medicare Other | Admitting: Anesthesiology

## 2020-01-18 ENCOUNTER — Other Ambulatory Visit: Payer: Self-pay | Admitting: Physician Assistant

## 2020-01-18 ENCOUNTER — Encounter (HOSPITAL_COMMUNITY)
Admission: EM | Disposition: A | Discharge status: Skilled Nursing Facility | Payer: Self-pay | Source: Home / Self Care | Attending: Family Medicine

## 2020-01-18 ENCOUNTER — Encounter (HOSPITAL_COMMUNITY): Payer: Self-pay | Admitting: Internal Medicine

## 2020-01-18 DIAGNOSIS — M869 Osteomyelitis, unspecified: Secondary | ICD-10-CM

## 2020-01-18 DIAGNOSIS — M25562 Pain in left knee: Secondary | ICD-10-CM | POA: Diagnosis not present

## 2020-01-18 DIAGNOSIS — M009 Pyogenic arthritis, unspecified: Secondary | ICD-10-CM | POA: Diagnosis not present

## 2020-01-18 DIAGNOSIS — I2699 Other pulmonary embolism without acute cor pulmonale: Secondary | ICD-10-CM | POA: Diagnosis not present

## 2020-01-18 HISTORY — PX: I & D EXTREMITY: SHX5045

## 2020-01-18 LAB — CBC WITH DIFFERENTIAL/PLATELET
Abs Immature Granulocytes: 0.17 10*3/uL — ABNORMAL HIGH (ref 0.00–0.07)
Basophils Absolute: 0 10*3/uL (ref 0.0–0.1)
Basophils Relative: 0 %
Eosinophils Absolute: 0 10*3/uL (ref 0.0–0.5)
Eosinophils Relative: 0 %
HCT: 27.5 % — ABNORMAL LOW (ref 39.0–52.0)
Hemoglobin: 8.3 g/dL — ABNORMAL LOW (ref 13.0–17.0)
Immature Granulocytes: 1 %
Lymphocytes Relative: 3 %
Lymphs Abs: 0.6 10*3/uL — ABNORMAL LOW (ref 0.7–4.0)
MCH: 24.9 pg — ABNORMAL LOW (ref 26.0–34.0)
MCHC: 30.2 g/dL (ref 30.0–36.0)
MCV: 82.3 fL (ref 80.0–100.0)
Monocytes Absolute: 0.5 10*3/uL (ref 0.1–1.0)
Monocytes Relative: 3 %
Neutro Abs: 18.3 10*3/uL — ABNORMAL HIGH (ref 1.7–7.7)
Neutrophils Relative %: 93 %
Platelets: 393 10*3/uL (ref 150–400)
RBC: 3.34 MIL/uL — ABNORMAL LOW (ref 4.22–5.81)
RDW: 19.1 % — ABNORMAL HIGH (ref 11.5–15.5)
WBC: 19.6 10*3/uL — ABNORMAL HIGH (ref 4.0–10.5)
nRBC: 0 % (ref 0.0–0.2)

## 2020-01-18 LAB — COMPREHENSIVE METABOLIC PANEL
ALT: 8 U/L (ref 0–44)
AST: 13 U/L — ABNORMAL LOW (ref 15–41)
Albumin: 2.6 g/dL — ABNORMAL LOW (ref 3.5–5.0)
Alkaline Phosphatase: 83 U/L (ref 38–126)
Anion gap: 10 (ref 5–15)
BUN: 12 mg/dL (ref 6–20)
CO2: 29 mmol/L (ref 22–32)
Calcium: 8.6 mg/dL — ABNORMAL LOW (ref 8.9–10.3)
Chloride: 99 mmol/L (ref 98–111)
Creatinine, Ser: 0.77 mg/dL (ref 0.61–1.24)
GFR, Estimated: >60 mL/min (ref >60)
Glucose, Bld: 144 mg/dL — ABNORMAL HIGH (ref 70–99)
Potassium: 3.3 mmol/L — ABNORMAL LOW (ref 3.5–5.1)
Sodium: 138 mmol/L (ref 135–145)
Total Bilirubin: 0.2 mg/dL — ABNORMAL LOW (ref 0.3–1.2)
Total Protein: 6.5 g/dL (ref 6.5–8.1)

## 2020-01-18 LAB — CBC
HCT: 28.8 % — ABNORMAL LOW (ref 39.0–52.0)
Hemoglobin: 8.7 g/dL — ABNORMAL LOW (ref 13.0–17.0)
MCH: 24.9 pg — ABNORMAL LOW (ref 26.0–34.0)
MCHC: 30.2 g/dL (ref 30.0–36.0)
MCV: 82.3 fL (ref 80.0–100.0)
Platelets: 377 10*3/uL (ref 150–400)
RBC: 3.5 MIL/uL — ABNORMAL LOW (ref 4.22–5.81)
RDW: 19.2 % — ABNORMAL HIGH (ref 11.5–15.5)
WBC: 12.2 10*3/uL — ABNORMAL HIGH (ref 4.0–10.5)
nRBC: 0 % (ref 0.0–0.2)

## 2020-01-18 LAB — HEPARIN LEVEL (UNFRACTIONATED): Heparin Unfractionated: 0.44 IU/mL (ref 0.30–0.70)

## 2020-01-18 SURGERY — IRRIGATION AND DEBRIDEMENT EXTREMITY
Anesthesia: General | Laterality: Left

## 2020-01-18 MED ORDER — CEFAZOLIN SODIUM-DEXTROSE 2-4 GM/100ML-% IV SOLN
2.0000 g | Freq: Four times a day (QID) | INTRAVENOUS | Status: AC
  Administered 2020-01-18 – 2020-01-19 (×3): 2 g via INTRAVENOUS
  Filled 2020-01-18 (×3): qty 100

## 2020-01-18 MED ORDER — PROPOFOL 10 MG/ML IV BOLUS
INTRAVENOUS | Status: DC | PRN
  Administered 2020-01-18: 200 mg via INTRAVENOUS

## 2020-01-18 MED ORDER — LACTATED RINGERS IV SOLN: INTRAVENOUS | Status: DC

## 2020-01-18 MED ORDER — OXYCODONE HCL 5 MG PO TABS
ORAL_TABLET | ORAL | Status: AC
  Administered 2020-01-18: 5 mg via ORAL
  Filled 2020-01-18: qty 1

## 2020-01-18 MED ORDER — GENTAMICIN SULFATE 40 MG/ML IJ SOLN
INTRAMUSCULAR | Status: DC | PRN
  Administered 2020-01-18: 240 mg

## 2020-01-18 MED ORDER — DEXAMETHASONE SODIUM PHOSPHATE 10 MG/ML IJ SOLN
INTRAMUSCULAR | Status: DC | PRN
  Administered 2020-01-18: 5 mg via INTRAVENOUS

## 2020-01-18 MED ORDER — MIDAZOLAM HCL 5 MG/5ML IJ SOLN
INTRAMUSCULAR | Status: DC | PRN
  Administered 2020-01-18: 2 mg via INTRAVENOUS

## 2020-01-18 MED ORDER — VANCOMYCIN HCL 1000 MG IV SOLR
INTRAVENOUS | Status: AC
  Filled 2020-01-18: qty 1000

## 2020-01-18 MED ORDER — ACETAMINOPHEN 10 MG/ML IV SOLN
INTRAVENOUS | Status: AC
  Administered 2020-01-18: 1000 mg via INTRAVENOUS
  Filled 2020-01-18: qty 100

## 2020-01-18 MED ORDER — VANCOMYCIN HCL 1000 MG IV SOLR
INTRAVENOUS | Status: DC | PRN
  Administered 2020-01-18: 1000 mg

## 2020-01-18 MED ORDER — ONDANSETRON HCL 4 MG/2ML IJ SOLN
INTRAMUSCULAR | Status: DC | PRN
  Administered 2020-01-18: 4 mg via INTRAVENOUS

## 2020-01-18 MED ORDER — PHENYLEPHRINE 40 MCG/ML (10ML) SYRINGE FOR IV PUSH (FOR BLOOD PRESSURE SUPPORT)
PREFILLED_SYRINGE | INTRAVENOUS | Status: DC | PRN
  Administered 2020-01-18: 160 ug via INTRAVENOUS
  Administered 2020-01-18: 120 ug via INTRAVENOUS

## 2020-01-18 MED ORDER — FENTANYL CITRATE (PF) 100 MCG/2ML IJ SOLN
INTRAMUSCULAR | Status: AC
  Administered 2020-01-18: 50 ug via INTRAVENOUS
  Filled 2020-01-18: qty 2

## 2020-01-18 MED ORDER — PROMETHAZINE HCL 25 MG/ML IJ SOLN: 6.2500 mg | INTRAMUSCULAR | Status: DC | PRN

## 2020-01-18 MED ORDER — SODIUM CHLORIDE 0.9 % IV SOLN: INTRAVENOUS | Status: DC

## 2020-01-18 MED ORDER — DOCUSATE SODIUM 100 MG PO CAPS
100.0000 mg | ORAL_CAPSULE | Freq: Two times a day (BID) | ORAL | Status: DC
  Administered 2020-01-20 – 2020-01-25 (×9): 100 mg via ORAL
  Filled 2020-01-18 (×21): qty 1

## 2020-01-18 MED ORDER — CHLORHEXIDINE GLUCONATE 0.12 % MT SOLN
15.0000 mL | Freq: Once | OROMUCOSAL | Status: AC
  Administered 2020-01-18: 15 mL via OROMUCOSAL

## 2020-01-18 MED ORDER — SODIUM CHLORIDE 0.9 % IR SOLN
Status: DC | PRN
  Administered 2020-01-18: 3000 mL

## 2020-01-18 MED ORDER — FENTANYL CITRATE (PF) 250 MCG/5ML IJ SOLN
INTRAMUSCULAR | Status: AC
  Filled 2020-01-18: qty 5

## 2020-01-18 MED ORDER — FENTANYL CITRATE (PF) 250 MCG/5ML IJ SOLN
INTRAMUSCULAR | Status: DC | PRN
  Administered 2020-01-18: 100 ug via INTRAVENOUS
  Administered 2020-01-18: 50 ug via INTRAVENOUS
  Administered 2020-01-18: 100 ug via INTRAVENOUS

## 2020-01-18 MED ORDER — CEFAZOLIN SODIUM-DEXTROSE 2-4 GM/100ML-% IV SOLN
2.0000 g | INTRAVENOUS | Status: AC
  Administered 2020-01-18: 2 g via INTRAVENOUS
  Filled 2020-01-18: qty 100

## 2020-01-18 MED ORDER — SCOPOLAMINE 1 MG/3DAYS TD PT72
1.0000 | MEDICATED_PATCH | TRANSDERMAL | Status: DC
  Administered 2020-01-18: 1.5 mg via TRANSDERMAL
  Filled 2020-01-18: qty 1

## 2020-01-18 MED ORDER — METHOCARBAMOL 500 MG PO TABS
ORAL_TABLET | ORAL | Status: AC
  Administered 2020-01-18: 500 mg via ORAL
  Filled 2020-01-18: qty 1

## 2020-01-18 MED ORDER — FENTANYL CITRATE (PF) 100 MCG/2ML IJ SOLN
INTRAMUSCULAR | Status: AC
  Filled 2020-01-18: qty 2

## 2020-01-18 MED ORDER — FENTANYL CITRATE (PF) 100 MCG/2ML IJ SOLN
25.0000 ug | INTRAMUSCULAR | Status: DC | PRN
  Administered 2020-01-18 (×2): 50 ug via INTRAVENOUS

## 2020-01-18 MED ORDER — ACETAMINOPHEN 10 MG/ML IV SOLN: 1000.0000 mg | Freq: Once | INTRAVENOUS | Status: AC

## 2020-01-18 MED ORDER — MIDAZOLAM HCL 2 MG/2ML IJ SOLN
INTRAMUSCULAR | Status: AC
  Filled 2020-01-18: qty 2

## 2020-01-18 MED ORDER — GENTAMICIN SULFATE 40 MG/ML IJ SOLN
INTRAMUSCULAR | Status: AC
  Filled 2020-01-18: qty 6

## 2020-01-18 SURGICAL SUPPLY — 36 items
BLADE SURG 21 STRL SS (BLADE) ×3 IMPLANT
BNDG COHESIVE 6X5 TAN STRL LF (GAUZE/BANDAGES/DRESSINGS) ×3 IMPLANT
CANISTER WOUNDNEG PRESSURE 500 (CANNISTER) ×3 IMPLANT
CNTNR URN SCR LID CUP LEK RST (MISCELLANEOUS) ×2 IMPLANT
CONT SPEC 4OZ STRL OR WHT (MISCELLANEOUS) ×4
COVER SURGICAL LIGHT HANDLE (MISCELLANEOUS) ×3 IMPLANT
DRAPE DERMATAC (DRAPES) ×6 IMPLANT
DRAPE INCISE IOBAN 66X45 STRL (DRAPES) ×3 IMPLANT
DRAPE U-SHAPE 47X51 STRL (DRAPES) ×3 IMPLANT
DRESSING PREVENA PLUS CUSTOM (GAUZE/BANDAGES/DRESSINGS) ×1 IMPLANT
DRSG PREVENA PLUS CUSTOM (GAUZE/BANDAGES/DRESSINGS) ×3
DURAPREP 26ML APPLICATOR (WOUND CARE) ×3 IMPLANT
ELECT REM PT RETURN 9FT ADLT (ELECTROSURGICAL) ×3
ELECTRODE REM PT RTRN 9FT ADLT (ELECTROSURGICAL) ×1 IMPLANT
GLOVE BIOGEL PI IND STRL 9 (GLOVE) ×1 IMPLANT
GLOVE BIOGEL PI INDICATOR 9 (GLOVE) ×2
GLOVE SURG ORTHO 9.0 STRL STRW (GLOVE) ×3 IMPLANT
GOWN STRL REUS W/ TWL XL LVL3 (GOWN DISPOSABLE) ×2 IMPLANT
GOWN STRL REUS W/TWL XL LVL3 (GOWN DISPOSABLE) ×4
HANDPIECE INTERPULSE COAX TIP (DISPOSABLE) ×2
KIT BASIN OR (CUSTOM PROCEDURE TRAY) ×3 IMPLANT
KIT STIMULAN  10CC (Orthopedic Implant) ×2 IMPLANT
KIT STIMULAN 10CC (Orthopedic Implant) ×1 IMPLANT
KIT TURNOVER KIT B (KITS) ×3 IMPLANT
MANIFOLD NEPTUNE II (INSTRUMENTS) ×3 IMPLANT
NS IRRIG 1000ML POUR BTL (IV SOLUTION) ×3 IMPLANT
PACK ORTHO EXTREMITY (CUSTOM PROCEDURE TRAY) ×3 IMPLANT
SET HNDPC FAN SPRY TIP SCT (DISPOSABLE) ×1 IMPLANT
STAPLER VISISTAT 35W (STAPLE) ×3 IMPLANT
STOCKINETTE IMPERVIOUS 9X36 MD (GAUZE/BANDAGES/DRESSINGS) ×3 IMPLANT
SUT ETHILON 2 0 PSLX (SUTURE) ×6 IMPLANT
SUT VIC AB 1 CTX 27 (SUTURE) ×3 IMPLANT
TOWEL GREEN STERILE (TOWEL DISPOSABLE) ×3 IMPLANT
TUBE CONNECTING 12'X1/4 (SUCTIONS) ×1
TUBE CONNECTING 12X1/4 (SUCTIONS) ×2 IMPLANT
YANKAUER SUCT BULB TIP NO VENT (SUCTIONS) ×3 IMPLANT

## 2020-01-18 NOTE — Progress Notes (Signed)
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-blood culture grew strep angina gnosis wound culture in OR grew strep angina gnosis strep para sanguinous and strep mitis strep salivary us-it has been suspected that he is manipulating and self inoculating infecting his left knee  admitted with chest pain on admission   Assessment & Plan:   Principal Problem:   Bilateral pulmonary embolism (HCC) Active Problems:   Essential hypertension   S/P AKA (above knee amputation) unilateral, right (HCC)   Other chronic pain   Septic arthritis (HCC)   Syncope   Elevated troponin   Chest pain   Bright red blood per rectum   Anemia   Encounter for central line placement   1. Chest pain a. Noncardiac ruled out by cardiology, see below 2. bilateral submassive pulmonary embolisms PSI score 1 a. Found on scan of chest b. Pulmonology consulted given need for surgical intervention and have cleared him although he is relatively high risk given recent PE c. Overall needs 3 to 6 months of anticoagulation 3. syncopal collapse a. Probably secondary to pulmonary embolism 4. septic arthritis in the setting of left AKA 11/2011 with recurrent admissions to [facility] 5. 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 Will need operative intervention-this will occur 11/3 hopeful to get bone biopsy b. Continuing at this time Unasyn and appreciate ID follow-up and input 6. mild hypokalemia a. Replacing with K. Dur 40 mEq daily b. Recheck with a.m. labs in addition to mag 7. ?   Bright red blood 8. chronic anemia a. No reported bleeding, hemoglobin is stable-monitor trends  DVT prophylaxis: Currently on heparin Code Status: Full CODE STATUS Family Communication: No family present currently Disposition: Inpatient  Status is: Inpatient  Remains inpatient appropriate because:Hemodynamically unstable, Persistent severe electrolyte disturbances, Unsafe d/c plan and IV treatments appropriate due to intensity of illness or inability to take PO   Dispo: The patient is from: Home              Anticipated d/c is to: Home              Anticipated d/c date is: 2 days              Patient currently is not medically stable to d/c.       Consultants:   Orthopedics  ID  Cardiology  Pulmonology  Procedures: CT chest Echo I&D of the left knee 10/28 Dr. Doreatha Martin  Antimicrobials: Unasyn currently   Subjective: Awake alert coherent no distress, is n.p.o. and is scheduled he tells me for procedure later today No distress Pain is moderate when addressed but becomes severe on movement he has a VAC in place  Objective: Vitals:   01/17/20 1218 01/17/20 1555 01/17/20 2002 01/18/20 0633  BP: (!) 143/86 (!) 146/88 (!) 146/81 129/81  Pulse: 89 89 73 89  Resp: 17 18 18 18   Temp: 98.4 F (36.9 C) 98.4 F (36.9 C) 98.7 F (37.1 C) 97.7 F (36.5 C)  TempSrc: Oral Oral Oral Oral  SpO2: 96% 100% 96% 93%  Weight:    83.7 kg  Height:  Intake/Output Summary (Last 24 hours) at 01/18/2020 9198 Last data filed at 01/17/2020 1700 Gross per 24 hour  Intake 1202.11 ml  Output 800 ml  Net 402.11 ml   Filed Weights   01/12/20 0600 01/14/20 0326 01/18/20 0221  Weight: 81.7 kg 79.4 kg 83.7 kg    Examination:  General exam: EOMI NCAT no focal deficit looks about stated age Respiratory system: Clinically clear no rales rhonchi Cardiovascular system: S1-S2 no murmur rub gallop RRR Gastrointestinal system: Abdomen soft no rebound no guarding. Central nervous  system: No focal deficit Extremities: AKA on right side he has a wound VAC on the left side not visualized skin surrounding the wound VAC is warm and red Skin: As above Psychiatry: Intact euthymic and congruent  Data Reviewed: I have personally reviewed following labs and imaging studies potassium 3.3 BUNs/creatinine 12/0.77 WBC 12.2, hemoglobin 8.4  Radiology Studies: No results found.   Scheduled Meds: . sodium chloride   Intravenous Once  . acetaminophen  1,000 mg Oral Q6H  . Chlorhexidine Gluconate Cloth  6 each Topical Daily  . docusate sodium  100 mg Oral BID  . gabapentin  300 mg Oral TID  . methocarbamol  1,000 mg Oral Q8H  . sodium chloride flush  10-40 mL Intracatheter Q12H   Continuous Infusions: . ampicillin-sulbactam (UNASYN) IV 3 g (01/18/20 0501)  . methocarbamol (ROBAXIN) IV       LOS: 7 days    Time spent: [place], MD [facility] To contact the attending provider between 7A-7P or the covering provider during after hours 7P-7A, please log into the web site [email or web address] and access using universal [place] password for that web site. If you do not have the password, please call the hospital operator.  01/18/2020, 7:22 AM

## 2020-01-18 NOTE — Progress Notes (Signed)
[place] for heparin Indication: pulmonary embolus  Allergies  Allergen Reactions  . Ivp Dye [Iodinated Diagnostic Agents] Anaphylaxis    Can be pre-treated with Benadryl  . Metrizamide Anaphylaxis  . Other Other (See Comments)    Under no circumstances will the patient agree to a PICC line  . Reglan [Metoclopramide] Anaphylaxis  . Shellfish-Derived Products Anaphylaxis  . Codeine Hives  . Lidocaine Other (See Comments) and Rash    Pt not sure if this is an actual allergy - might have been a one time incident  . Methadone Hives  . Morphine Swelling and Rash    Local reaction to IV being pushed too fast  . Propoxyphene Hives  . Toradol [Ketorolac Tromethamine] Itching    Patient having systemic itching after receiving IV dose  . Iodine Rash    Reports localized reaction at IV site. Able to tolerate with Benadryl.   . Tape Rash  . Vancomycin Rash    Has had vancomycin since this reaction and had no reaction at all    Patient Measurements: Height: 5\' 10"  (177.8 cm) Weight: 83.7 kg (184 lb 8.4 oz) IBW/kg (Calculated) : 73 Heparin Dosing Weight: 79.4 kg  Vital Signs: Temp: 97.7 F (36.5 C) (11/03 0633) Temp Source: Oral (11/03 1610) BP: 129/81 (11/03 9604) Pulse Rate: 89 (11/03 0633)  Labs: Recent Labs    01/16/20 0328 01/16/20 1250 01/17/20 0434 01/17/20 0434 01/17/20 1230 01/17/20 2000 01/18/20 0515 01/18/20 0516  HGB 7.8*  --  8.4*  --   --   --   --  8.7*  HCT 25.2*  --  28.0*  --   --   --   --  28.8*  PLT 378  --  358  --   --   --   --  377  HEPARINUNFRC 1.02*   < > 0.23*   < > 0.16* 0.35 0.44  --   CREATININE  --   --   --   --   --   --   --  0.77   < > = values in this interval not displayed.    Estimated Creatinine Clearance: 112.8 mL/min (by C-G formula based on SCr of 0.77 mg/dL).   Medical History: Past Medical History:  Diagnosis Date  . Allergy   . Anxiety   . Arthritis    left shoulder  .  Bronchitis   . Chest pain 07/14/2016  . Complication of anesthesia   . DJD (degenerative joint disease) 11/29/2011  . Hypertension   . Neuromuscular disorder ([place])    carpal tunnel bilateral, ulner nerve surgery  . PONV (postoperative nausea and vomiting)   . Septic arthritis of knee, right ([place]) 11/29/2011  . Spinal headache    "long time ago"   Assessment: 54 yr old male POD#2 I&D L knee and L anterior thigh abscess. Pharmacy consulted 10/30 to switch IV heparin to Lovenox for possible pulmonary embolism due to difficult IV access, need for central line placement and CTA.   10/31: CTA showed bilateral non-occlusive PE. Pharmacy now consulted to switch back to heparin with plans to return to OR for repeat I&D.    No bleeding noted.  Heparin drip 1550 uts/hr HL 0.44 at goal.   Heparin currently off for I&D.   Goal of Therapy:   Heparin level 0.3-0.7 units/mL Monitor platelets by anticoagulation protocol: Yes   Plan:  Follow up heparin plan after OR Daily heparin level, cbc  Pilar Plate  [place] Pulling PharmD., BCPS Clinical Pharmacist 01/18/2020 10:06 AM

## 2020-01-18 NOTE — Anesthesia Preprocedure Evaluation (Signed)
Anesthesia Evaluation  Patient identified by MRN, date of birth, ID band Patient awake    Reviewed: Patient's Chart, lab work & pertinent test results  History of Anesthesia Complications (+) PONV, POST - OP SPINAL HEADACHE and history of anesthetic complications  Airway Mallampati: II  TM Distance: >3 FB Neck ROM: Full    Dental  (+) Teeth Intact   Pulmonary neg pulmonary ROS,    Pulmonary exam normal        Cardiovascular Exercise Tolerance: Good hypertension,  Rhythm:Regular Rate:Normal     Neuro/Psych Anxiety  Neuromuscular disease (phantom limb pain)    GI/Hepatic negative GI ROS, Neg liver ROS,   Endo/Other  negative endocrine ROS  Renal/GU negative Renal ROS  negative genitourinary   Musculoskeletal  (+) Arthritis , Osteoarthritis,  Septic arthritis   Abdominal (+)  Abdomen: soft. Bowel sounds: normal.  Peds  Hematology  (+) anemia ,   Anesthesia Other Findings   Reproductive/Obstetrics negative OB ROS                             Anesthesia Physical  Anesthesia Plan  ASA: II  Anesthesia Plan: General   Post-op Pain Management:    Induction:   PONV Risk Score and Plan: 3 and Ondansetron, Dexamethasone, Scopolamine patch - Pre-op and Treatment may vary due to age or medical condition  Airway Management Planned: LMA  Additional Equipment: None  Intra-op Plan:   Post-operative Plan: Extubation in OR  Informed Consent: I have reviewed the patients History and Physical, chart, labs and discussed the procedure including the risks, benefits and alternatives for the proposed anesthesia with the patient or authorized representative who has indicated his/her understanding and acceptance.     Dental advisory given  Plan Discussed with: CRNA  Anesthesia Plan Comments: (Lab Results      Component                Value               Date                      WBC                       9.7                 01/12/2020                HGB                      8.0 (L)             01/12/2020                HCT                      26.7 (L)            01/12/2020                MCV                      79.7 (L)            01/12/2020                PLT  493 (H)             01/12/2020          )       Anesthesia Quick Evaluation

## 2020-01-18 NOTE — Transfer of Care (Signed)
Immediate Anesthesia Transfer of Care Note  Patient: Jason [name]  Procedure(s) Performed: IRRIGATION AND DEBRIDEMENT EXTREMITY (Left )  Patient Location: PACU  Anesthesia Type:General  Level of Consciousness: awake, alert , oriented and patient cooperative  Airway & Oxygen Therapy: Patient Spontanous Breathing  Post-op Assessment: Report given to RN and Post -op Vital signs reviewed and stable  Post vital signs: Reviewed and stable  Last Vitals:  Vitals Value Taken Time  BP    Temp    Pulse 106 01/18/20 1642  Resp 21 01/18/20 1642  SpO2 94 % 01/18/20 1642  Vitals shown include unvalidated device data.  Last Pain:  Vitals:   01/18/20 1205  TempSrc:   PainSc: 2       Patients Stated Pain Goal: 2 (95/09/32 6712)  Complications: No complications documented.

## 2020-01-18 NOTE — Anesthesia Procedure Notes (Signed)
Procedure Name: LMA Insertion Date/Time: 01/18/2020 3:42 PM Performed by: Shirlyn Goltz, CRNA Pre-anesthesia Checklist: Patient identified, Emergency Drugs available, Suction available and Patient being monitored Patient Re-evaluated:Patient Re-evaluated prior to induction Oxygen Delivery Method: Circle system utilized Preoxygenation: Pre-oxygenation with 100% oxygen Induction Type: IV induction LMA: LMA inserted LMA Size: 5.0 Number of attempts: 1 Placement Confirmation: breath sounds checked- equal and bilateral and positive ETCO2 Tube secured with: Tape Dental Injury: Teeth and Oropharynx as per pre-operative assessment

## 2020-01-18 NOTE — Anesthesia Postprocedure Evaluation (Signed)
Anesthesia Post Note  Patient: Jason [name]  Procedure(s) Performed: IRRIGATION AND DEBRIDEMENT EXTREMITY (Left )     Patient location during evaluation: PACU Anesthesia Type: General Level of consciousness: awake Pain management: pain level controlled Vital Signs Assessment: post-procedure vital signs reviewed and stable Respiratory status: spontaneous breathing, nonlabored ventilation, respiratory function stable and patient connected to nasal cannula oxygen Cardiovascular status: blood pressure returned to baseline and stable Postop Assessment: no apparent nausea or vomiting Anesthetic complications: no   No complications documented.  Last Vitals:  Vitals:   01/18/20 1740 01/18/20 1750  BP: (!) 141/58   Pulse: (!) 103 (!) 108  Resp: (!) 7 11  Temp: 36.6 C   SpO2: 100% 96%    Last Pain:  Vitals:   01/18/20 1904  TempSrc:   PainSc: 10-Worst pain ever                 Angelyse Heslin P Reality Dejonge

## 2020-01-18 NOTE — Progress Notes (Signed)
Verbal Order from Dr Doreatha Martin to stop Heparin gtt now and make sure pt is NPO. Orders placed. Jessie Foot, RN
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
## 2020-01-18 NOTE — Op Note (Addendum)
01/18/2020  5:22 PM  PATIENT:  Jason [name]    PRE-OPERATIVE DIAGNOSIS:  Left septic knee  POST-OPERATIVE DIAGNOSIS:  Same  PROCEDURE: Arthrotomy left knee and excisional debridement DEBRIDEMENT EXTREMITY of skin and soft tissue and capsule. Bony resection of the distal femur and proximal tibia resected for osteomyelitis. Application of incisional wound VAC  SURGEON:  Newt Minion, MD  PHYSICIAN ASSISTANT:None ANESTHESIA:   General  PREOPERATIVE INDICATIONS:  [name] is a  54 y.o. male with a diagnosis of Left septic knee who failed conservative measures and elected for surgical management.    The risks benefits and alternatives were discussed with the patient preoperatively including but not limited to the risks of infection, bleeding, nerve injury, cardiopulmonary complications, the need for revision surgery, among others, and the patient was willing to proceed.  OPERATIVE IMPLANTS: Stimulant antibiotic beads 10 cc with 1 g vancomycin and 240 mg gentamicin  @ENCIMAGES @  OPERATIVE FINDINGS: Fibrinous tissue complete destruction of the medial femoral condyle medial tibial plateau,  bone and soft tissue sent for cultures  OPERATIVE PROCEDURE: Patient brought the operating room and underwent general anesthetic.  After adequate levels anesthesia were obtained patient's left lower extremity was prepped using DuraPrep draped into a sterile field a timeout was called.  The incision was extended proximally and distally.  This was carried down to a medial parapatellar retinacular incision.  Antibiotic beads on the string were removed.  Patient had complete destruction of the medial joint line with destruction of all the cartilage and bone of the medial femoral condyle and medial tibial plateau the meniscus was dislocated as well.  A curette and rondure were used to debride the medial femoral condyle medial tibial plateau with resection of bone back to healthy bleeding subchondral bone.   Bone cartilage and soft tissue and meniscus was removed.  The articular cartilage laterally was intact there was a film of fibrinous tissue over the bone this was resected there is also a filling layer over the joint capsule which was also resected.  The wound was irrigated with pulsatile lavage the tissue was sent for cultures.  10 cc of stimulant antibiotic beads with 1 g of vancomycin and 240 mg of gentamicin were placed within the suprapatellar pouch as well as the medial joint line.  The retinaculum was closed using #1 Vicryl the skin was closed using 2-0 nylon and staples and incisional wound VAC was applied patient will be placed in a knee immobilizer he was extubated taken the PACU in stable condition.   DISCHARGE PLANNING:  Antibiotic duration: Anticipate 6 weeks of IV antibiotics  Weightbearing: Weightbearing as tolerated on the left with a knee immobilizer  Pain medication: Opioid pathway  Dressing care/ Wound VAC: Continue wound VAC for 1 week  Ambulatory devices: Crutches or walker  Discharge to: Anticipate discharge to home  Follow-up: In the office 1 week post operative.

## 2020-01-19 ENCOUNTER — Encounter (HOSPITAL_COMMUNITY): Payer: Self-pay | Admitting: Orthopedic Surgery

## 2020-01-19 DIAGNOSIS — I2699 Other pulmonary embolism without acute cor pulmonale: Secondary | ICD-10-CM | POA: Diagnosis not present

## 2020-01-19 LAB — RENAL FUNCTION PANEL
Albumin: 2.7 g/dL — ABNORMAL LOW (ref 3.5–5.0)
Anion gap: 10 (ref 5–15)
BUN: 12 mg/dL (ref 6–20)
CO2: 28 mmol/L (ref 22–32)
Calcium: 8.8 mg/dL — ABNORMAL LOW (ref 8.9–10.3)
Chloride: 101 mmol/L (ref 98–111)
Creatinine, Ser: 0.93 mg/dL (ref 0.61–1.24)
GFR, Estimated: >60 mL/min (ref >60)
Glucose, Bld: 189 mg/dL — ABNORMAL HIGH (ref 70–99)
Phosphorus: 3.7 mg/dL (ref 2.5–4.6)
Potassium: 4.4 mmol/L (ref 3.5–5.1)
Sodium: 139 mmol/L (ref 135–145)

## 2020-01-19 LAB — COMPREHENSIVE METABOLIC PANEL
ALT: 9 U/L (ref 0–44)
AST: 15 U/L (ref 15–41)
Albumin: 2.6 g/dL — ABNORMAL LOW (ref 3.5–5.0)
Alkaline Phosphatase: 81 U/L (ref 38–126)
Anion gap: 10 (ref 5–15)
BUN: 10 mg/dL (ref 6–20)
CO2: 27 mmol/L (ref 22–32)
Calcium: 8.7 mg/dL — ABNORMAL LOW (ref 8.9–10.3)
Chloride: 102 mmol/L (ref 98–111)
Creatinine, Ser: 0.83 mg/dL (ref 0.61–1.24)
GFR, Estimated: >60 mL/min (ref >60)
Glucose, Bld: 127 mg/dL — ABNORMAL HIGH (ref 70–99)
Potassium: 4 mmol/L (ref 3.5–5.1)
Sodium: 139 mmol/L (ref 135–145)
Total Bilirubin: 0.3 mg/dL (ref 0.3–1.2)
Total Protein: 6.1 g/dL — ABNORMAL LOW (ref 6.5–8.1)

## 2020-01-19 LAB — CBC WITH DIFFERENTIAL/PLATELET
Abs Immature Granulocytes: 0.14 10*3/uL — ABNORMAL HIGH (ref 0.00–0.07)
Basophils Absolute: 0 10*3/uL (ref 0.0–0.1)
Basophils Relative: 0 %
Eosinophils Absolute: 0.1 10*3/uL (ref 0.0–0.5)
Eosinophils Relative: 1 %
HCT: 24.7 % — ABNORMAL LOW (ref 39.0–52.0)
Hemoglobin: 7.4 g/dL — ABNORMAL LOW (ref 13.0–17.0)
Immature Granulocytes: 1 %
Lymphocytes Relative: 9 %
Lymphs Abs: 1.3 10*3/uL (ref 0.7–4.0)
MCH: 24.6 pg — ABNORMAL LOW (ref 26.0–34.0)
MCHC: 30 g/dL (ref 30.0–36.0)
MCV: 82.1 fL (ref 80.0–100.0)
Monocytes Absolute: 0.9 10*3/uL (ref 0.1–1.0)
Monocytes Relative: 6 %
Neutro Abs: 11.7 10*3/uL — ABNORMAL HIGH (ref 1.7–7.7)
Neutrophils Relative %: 83 %
Platelets: 358 10*3/uL (ref 150–400)
RBC: 3.01 MIL/uL — ABNORMAL LOW (ref 4.22–5.81)
RDW: 19.3 % — ABNORMAL HIGH (ref 11.5–15.5)
WBC: 14.1 10*3/uL — ABNORMAL HIGH (ref 4.0–10.5)
nRBC: 0 % (ref 0.0–0.2)

## 2020-01-19 LAB — LACTIC ACID, PLASMA
Lactic Acid, Venous: 2.7 mmol/L (ref 0.5–1.9)
Lactic Acid, Venous: 1.2 mmol/L (ref 0.5–1.9)

## 2020-01-19 LAB — HEPARIN LEVEL (UNFRACTIONATED): Heparin Unfractionated: 0.53 IU/mL (ref 0.30–0.70)

## 2020-01-19 LAB — TROPONIN I (HIGH SENSITIVITY): Troponin I (High Sensitivity): 34 ng/L — ABNORMAL HIGH (ref <18)

## 2020-01-19 LAB — MAGNESIUM: Magnesium: 1.8 mg/dL (ref 1.7–2.4)

## 2020-01-19 LAB — GLUCOSE, CAPILLARY: Glucose-Capillary: 107 mg/dL — ABNORMAL HIGH (ref 70–99)

## 2020-01-19 MED ORDER — SODIUM CHLORIDE 0.9 % IV BOLUS
1000.0000 mL | Freq: Once | INTRAVENOUS | Status: AC
  Administered 2020-01-19: 1000 mL via INTRAVENOUS

## 2020-01-19 MED ORDER — HYDROMORPHONE HCL 1 MG/ML IJ SOLN
2.0000 mg | INTRAMUSCULAR | Status: DC | PRN
  Administered 2020-01-19 – 2020-01-22 (×22): 2 mg via INTRAVENOUS
  Filled 2020-01-19 (×23): qty 2

## 2020-01-19 MED ORDER — HEPARIN (PORCINE) 25000 UT/250ML-% IV SOLN
1900.0000 [IU]/h | INTRAVENOUS | Status: DC
  Administered 2020-01-19 – 2020-01-22 (×7): 1550 [IU]/h via INTRAVENOUS
  Administered 2020-01-24: 2000 [IU]/h via INTRAVENOUS
  Administered 2020-01-24: 1900 [IU]/h via INTRAVENOUS
  Administered 2020-01-25 (×2): 2000 [IU]/h via INTRAVENOUS
  Administered 2020-01-26 – 2020-01-27 (×3): 1900 [IU]/h via INTRAVENOUS
  Filled 2020-01-19 (×16): qty 250

## 2020-01-19 NOTE — Plan of Care (Signed)
  Problem: Education: Goal: Knowledge of General Education information will improve Description: Including pain rating scale, medication(s)/side effects and non-pharmacologic comfort measures Outcome: Progressing   Problem: Clinical Measurements: Goal: Ability to maintain clinical measurements within normal limits will improve Outcome: Progressing Goal: Will remain free from infection Outcome: Progressing Goal: Diagnostic test results will improve Outcome: Progressing Goal: Cardiovascular complication will be avoided Outcome: Progressing   Problem: Pain Managment: Goal: General experience of comfort will improve Outcome: Progressing   Problem: Safety: Goal: Ability to remain free from injury will improve Outcome: Progressing

## 2020-01-19 NOTE — Progress Notes (Signed)
CRITICAL VALUE ALERT  Critical Value:  Lactic 2.7  Date & Time Notied:  01/19/20 @ 0001  Provider Notified: Marlyce Huge, MD  Orders Received/Actions taken: See new orders

## 2020-01-19 NOTE — Progress Notes (Signed)
PT Cancellation Note  Patient Details Name: Jason [name] MRN: [id number] DOB: 22-Jan-1966   Cancelled Treatment:    Reason Eval/Treat Not Completed: Pain limiting ability to participate. OT saw pt earlier and pt unable to progress past long sitting due to pain and didn't feel he could progress any further today. Will follow up tomorrow.    Shary Decamp [facility] 01/19/2020, 3:37 PM [place] Pager [phone] Office [phone]

## 2020-01-19 NOTE — Progress Notes (Signed)
[facility]   Reason for visit: Follow up on septic arthritis and osteomyelitis  Interval History: s/p debridement again by Dr. Sharol Given.  Extensive infection involving bone s/p some resection.  No new culture growth. Some pain. No associated rash or diarrhea. Some abdominal discomfort and decreased po.    Physical Exam: Constitutional:  Vitals:   01/19/20 0704 01/19/20 0810  BP:  (!) 153/82  Pulse:  98  Resp:  15  Temp: 98.8 F (37.1 C) 98.4 F (36.9 C)  SpO2:  95%   patient appears in NAD Eyes: anicteric HENT: no thrush Respiratory: Normal respiratory effort; CTA B Cardiovascular: RRR MS: wrapped  Review of Systems: Constitutional: negative for fevers and chills Gastrointestinal: negative for diarrhea  Lab Results  Component Value Date   WBC 14.1 (H) 01/19/2020   HGB 7.4 (L) 01/19/2020   HCT 24.7 (L) 01/19/2020   MCV 82.1 01/19/2020   PLT 358 01/19/2020    Lab Results  Component Value Date   CREATININE 0.83 01/19/2020   BUN 10 01/19/2020   NA 139 01/19/2020   K 4.0 01/19/2020   CL 102 01/19/2020   CO2 27 01/19/2020    Lab Results  Component Value Date   ALT 9 01/19/2020   AST 15 01/19/2020   ALKPHOS 81 01/19/2020     Microbiology: Recent Results (from the past 240 hour(s))  Respiratory Panel by RT PCR (Flu A&B, Covid) - Nasopharyngeal Swab     Status: None   Collection Time: 01/11/20  8:14 PM   Specimen: Nasopharyngeal Swab  Result Value Ref Range Status   SARS Coronavirus 2 by RT PCR NEGATIVE NEGATIVE Final    Comment: (NOTE) SARS-CoV-2 target nucleic acids are NOT DETECTED.  The SARS-CoV-2 RNA is generally detectable in upper respiratoy specimens during the acute phase of infection. The lowest concentration of SARS-CoV-2 viral copies this assay can detect is 131 copies/mL. A negative result does not preclude SARS-Cov-2 infection and should not be used as the sole basis for treatment or other patient management  decisions. A negative result may occur with  improper specimen collection/handling, submission of specimen other than nasopharyngeal swab, presence of viral mutation(s) within the areas targeted by this assay, and inadequate number of viral copies (<131 copies/mL). A negative result must be combined with clinical observations, patient history, and epidemiological information. The expected result is Negative.  Fact Sheet for Patients:  PinkCheek.be  Fact Sheet for Healthcare Providers:  GravelBags.it  This test is no t yet approved or cleared by the [place] FDA and  has been authorized for detection and/or diagnosis of SARS-CoV-2 by FDA under an Emergency Use Authorization (EUA). This EUA will remain  in effect (meaning this test can be used) for the duration of the COVID-19 declaration under Section 564(b)(1) of the Act, 21 U.S.C. section 360bbb-3(b)(1), unless the authorization is terminated or revoked sooner.     Influenza A by PCR NEGATIVE NEGATIVE Final   Influenza B by PCR NEGATIVE NEGATIVE Final    Comment: (NOTE) The Xpert Xpress SARS-CoV-2/FLU/RSV assay is intended as an aid in  the diagnosis of influenza from Nasopharyngeal swab specimens and  should not be used as a sole basis for treatment. Nasal washings and  aspirates are unacceptable for Xpert Xpress SARS-CoV-2/FLU/RSV  testing.  Fact Sheet for Patients: PinkCheek.be  Fact Sheet for Healthcare Providers: GravelBags.it  This test is not yet approved or cleared by the [place] and  has been authorized  for detection and/or diagnosis of SARS-CoV-2 by  FDA under an Emergency Use Authorization (EUA). This EUA will remain  in effect (meaning this test can be used) for the duration of the  Covid-19 declaration under Section 564(b)(1) of the Act, 21  U.S.C. section 360bbb-3(b)(1), unless the  authorization is  terminated or revoked. Performed at [facility], [place] [address], [place], [place] [address]   Culture, blood (Routine X 2) w Reflex to ID Panel     Status: None   Collection Time: 01/12/20  5:39 AM   Specimen: BLOOD LEFT HAND  Result Value Ref Range Status   Specimen Description BLOOD LEFT HAND  Final   Special Requests   Final    BOTTLES DRAWN AEROBIC AND ANAEROBIC Blood Culture adequate volume   Culture   Final    NO GROWTH 5 DAYS Performed at [facility], [place] [address] [place], [place] [address]    Report Status 01/17/2020 FINAL  Final  Surgical pcr screen     Status: None   Collection Time: 01/12/20 10:31 AM   Specimen: Nasal Mucosa; Nasal Swab  Result Value Ref Range Status   MRSA, PCR NEGATIVE NEGATIVE Final   Staphylococcus aureus NEGATIVE NEGATIVE Final    Comment: (NOTE) The Xpert SA Assay (FDA approved for NASAL specimens in patients 47 years of age and older), is one component of a comprehensive surveillance program. It is not intended to diagnose infection nor to guide or monitor treatment. Performed at [facility], [place] [address], [place], [place] [address]   Aerobic/Anaerobic Culture (surgical/deep wound)     Status: None   Collection Time: 01/12/20 12:31 PM   Specimen: PATH Other; Tissue  Result Value Ref Range Status   Specimen Description TISSUE  Final   Special Requests LEFT KNEE PUSS SPEC A  Final   Gram Stain   Final    MODERATE WBC PRESENT, PREDOMINANTLY PMN FEW GRAM NEGATIVE RODS    Culture   Final    FEW PREVOTELLA DENTICOLA BETA LACTAMASE POSITIVE Performed at [facility], [place] [address], [place], [place] [address]    Report Status 01/15/2020 FINAL  Final  Culture, blood (Routine X 2) w Reflex to ID Panel     Status: None   Collection Time: 01/12/20  3:04 PM   Specimen: BLOOD  Result Value Ref Range Status   Specimen Description BLOOD LEFT ANTECUBITAL  Final   Special Requests   Final     BOTTLES DRAWN AEROBIC AND ANAEROBIC Blood Culture adequate volume   Culture   Final    NO GROWTH 5 DAYS Performed at [facility], [place] [address], [place], [place] [address]    Report Status 01/17/2020 FINAL  Final  Aerobic/Anaerobic Culture (surgical/deep wound)     Status: None (Preliminary result)   Collection Time: 01/18/20  4:18 PM   Specimen: Soft Tissue, Other  Result Value Ref Range Status   Specimen Description TISSUE  Final   Special Requests IRRIGATION AND DEBRIDMENT SEPTIC LEFT KNEE  Final   Gram Stain   Final    RARE WBC PRESENT,BOTH PMN AND MONONUCLEAR NO ORGANISMS SEEN    Culture   Final    NO GROWTH < 24 HOURS Performed at [facility], [place] [address], [place],  [address]    Report Status PENDING  Incomplete    Impression/Plan:  1. Septic arthritis with osteomyelitis - extensive infection noted and growth of Provetella  denticola.  Ideally, he will get IV antibiotics for a prolonged period.  I am though hesitant to have him discharged with a picc line with concern for manipulation of the line.  His culture is suggestive of an oral organism which is similar to previous organisms in his other leg infection suggesting this is self induced.  Best option for his is to remain inpatient for a prolonged period and consider a shorter duration of IV or oral antibiotics at discharge or potentially placement/rehab.    2.  Access - as above, he may need a picc line but concern with manipulation with his history of self induced injury/Munchhausen.  He will need to be counseled on avoiding inappropriate use of the line at the time of discharge if needed.    3.  Abdominal discomfort - likely from amp/sulbactam.  Tolerating well enough though so will continue.    ID will follow up again next week.

## 2020-01-19 NOTE — Progress Notes (Signed)
Occupational Therapy Treatment Patient Details Name: Jason [name] MRN: [id number] DOB: 22-Nov-1965 Today's Date: 01/19/2020    History of present illness Pt adm lt septic knee arthritis and lt distal femur/proximal tibia osteomyelitis. On 10/28 underwent I&D of lt knee and lt anterior thigh abscess and placement of antibiotic beads and cement. PMH - Rt AKA, rt thr, Munchausen syndrome, HTN, anxiety, arthritis, multiple shoulder surgeries.    OT comments  Pt seen for OT f/u session s/p Arthrotomy left knee and excisional debridement with bone involvement for concerning osteomyelitis. L KI arrived during OT Session via orthotech. Educated pt on wearing schedule and how to don/doff but pt not wanting OT to don brace at this time. He was able to pull into long sitting with supervision assist, but required mod A to help assist LLE to edge of mattress. Once edge of mattress, pt declined progressing to sitting EOB due to pain. Educated pt on importance of mobilizing for skin integrity (sacral area is becoming raw). D/c recs remain appropriate at this time unless pt improves. Will continue to follow per POC listed below.    Follow Up Recommendations  SNF;Supervision/Assistance - 24 hour    Equipment Recommendations  3 in 1 bedside commode;Wheelchair (measurements OT);Wheelchair cushion (measurements OT)    Recommendations for Other Services      Precautions / Restrictions Precautions Precautions: Fall Required Braces or Orthoses: Knee Immobilizer - Left Knee Immobilizer - Left: On when out of bed or walking Restrictions Weight Bearing Restrictions: Yes LLE Weight Bearing: Weight bearing as tolerated       Mobility Bed Mobility Overal bed mobility: Needs Assistance Bed Mobility: Supine to Sit     Supine to sit: Mod assist     General bed mobility comments: Pt came to long sitting with supervision and required mod A to guide L leg to edge of mattress. Once leg was there, pt become  anxious and refused further movement. Fearful of mobilizing that leg at all despite education  Transfers                 General transfer comment: Declined - low pain threshold    Balance                                           ADL either performed or assessed with clinical judgement   ADL Overall ADL's : Needs assistance/impaired                                       General ADL Comments: remains self limiting with mobility. Only able to long sit and move LLE slightly to EOB this date before declining and lying back down. 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  Exercises     Shoulder Instructions       General Comments      Pertinent Vitals/ Pain       Pain Assessment: 0-10 Pain Score: 9  Pain Location: lt knee and leg Pain Descriptors / Indicators: Guarding;Grimacing Pain Intervention(s): Monitored during session;Repositioned  Home Living                                          Prior Functioning/Environment              Frequency  Min 2X/week        Progress Toward Goals  OT Goals(current goals can now be found in the care plan section)  Progress towards OT goals: Progressing toward goals  Acute Rehab OT Goals Patient Stated Goal: I need better pain management OT Goal Formulation: With patient Time For Goal Achievement: 01/30/20 Potential to Achieve Goals: [facility] Discharge plan remains appropriate;Discharge plan needs to be updated     Co-evaluation                 AM-PAC OT "6 Clicks" Daily Activity     Outcome Measure   Help from another person eating meals?: None Help from another person taking care of personal grooming?: A Little Help from another person toileting, which includes using toliet, bedpan, or urinal?: A Lot Help from another person bathing (including washing, rinsing, drying)?: A Lot Help from another person to put on and taking off regular upper body clothing?: A Little Help from another person to put on and taking off regular lower body clothing?: A Lot 6 Click Score: 16    End of Session Equipment Utilized During Treatment: Left knee immobilizer  OT Visit Diagnosis: Pain;Other abnormalities of gait and mobility (R26.89) Pain - Right/Left: Left Pain - part of body: Leg   Activity Tolerance Patient limited by pain   Patient Left in bed;with call bell/phone within reach   Nurse Communication Mobility status        Time: 3710-6269 OT Time Calculation (min): 28 min  Charges: OT General Charges $OT Visit: 1 Visit OT Treatments $Self Care/Home Management : 23-37 mins  Zenovia Jarred, MSOT, OTR/L [facility] Office Number: [phone] Pager: [phone]  Zenovia Jarred 01/19/2020, 1:22 PM

## 2020-01-19 NOTE — Progress Notes (Signed)
[facility] for Heparin Indication: pulmonary embolus  Allergies  Allergen Reactions  . Ivp Dye [Iodinated Diagnostic Agents] Anaphylaxis    Can be pre-treated with Benadryl  . Metrizamide Anaphylaxis  . Other Other (See Comments)    Under no circumstances will the patient agree to a PICC line  . Reglan [Metoclopramide] Anaphylaxis  . Shellfish-Derived Products Anaphylaxis  . Codeine Hives  . Lidocaine Other (See Comments) and Rash    Pt not sure if this is an actual allergy - might have been a one time incident  . Methadone Hives  . Morphine Swelling and Rash    Local reaction to IV being pushed too fast  . Propoxyphene Hives  . Toradol [Ketorolac Tromethamine] Itching    Patient having systemic itching after receiving IV dose  . Iodine Rash    Reports localized reaction at IV site. Able to tolerate with Benadryl.   . Tape Rash  . Vancomycin Rash    Has had vancomycin since this reaction and had no reaction at all    Patient Measurements: Height: 5\' 10"  (177.8 cm) Weight: 83.7 kg (184 lb 8.4 oz) IBW/kg (Calculated) : 73 Heparin Dosing Weight: 79.4 kg  Vital Signs: Temp: 98.2 F (36.8 C) (11/04 1100) Temp Source: Oral (11/04 1100) BP: 148/72 (11/04 1100) Pulse Rate: 104 (11/04 1100)  Labs: Recent Labs    01/17/20 0434 01/17/20 2000 01/18/20 0515 01/18/20 0516 01/18/20 0516 01/18/20 2244 01/19/20 0500 01/19/20 1055  HGB   < >  --   --  8.7*   < > 8.3* 7.4*  --   HCT   < >  --   --  28.8*  --  27.5* 24.7*  --   PLT   < >  --   --  377  --  393 358  --   HEPARINUNFRC  --  0.35 0.44  --   --   --   --  0.53  CREATININE  --   --   --  0.77  --  0.93 0.83  --   TROPONINIHS  --   --   --   --   --  34*  --   --    < > = values in this interval not displayed.    Estimated Creatinine Clearance: 108.7 mL/min (by C-G formula based on SCr of 0.83 mg/dL).   Medical History: Past Medical History:  Diagnosis Date  . Allergy   .  Anxiety   . Arthritis    left shoulder  . Bronchitis   . Chest pain 07/14/2016  . Complication of anesthesia   . DJD (degenerative joint disease) 11/29/2011  . Hypertension   . Neuromuscular disorder ([place])    carpal tunnel bilateral, ulner nerve surgery  . PONV (postoperative nausea and vomiting)   . Septic arthritis of knee, right ([place]) 11/29/2011  . Spinal headache    "long time ago"   Assessment: 54 yr old male POD#2 I&D L knee and L anterior thigh abscess. Pharmacy consulted 10/30 to switch IV heparin to Lovenox for possible pulmonary embolism due to difficult IV access, need for central line placement and CTA.   10/31: CTA showed bilateral non-occlusive PE. Pharmacy now consulted to switch back to heparin with plans to return to OR for repeat I&D.    No bleeding noted.  Heparin drip 1550 uts/hr HL 0.44 at goal.   Heparin currently off for I&D.   11/4 AM update:  Heparin  was off 11/3 for I&D Heparin restarted early this am, level continues to be at goal. No issues noted.   Goal of Therapy:   Heparin level 0.3-0.7 units/mL Monitor platelets by anticoagulation protocol: Yes   Plan:  Continue heparin at 1550 units/hr  Erin Hearing PharmD., BCPS Clinical Pharmacist 01/19/2020 3:12 PM

## 2020-01-19 NOTE — Progress Notes (Signed)
Orthopedic Tech Progress Note Patient Details:  MERLE CIRELLI Aug 20, 1965 [id number] PT was working with patient. Ortho let nurse know when patient is getting up she can call ortho to come put on. Ortho Devices Type of Ortho Device: Knee Immobilizer Ortho Device/Splint Location: LLE Ortho Device/Splint Interventions: Ordered   Post Interventions Patient Tolerated: Other (comment) Instructions Provided: Other (comment)   Ellouise Newer 01/19/2020, 11:32 AM

## 2020-01-19 NOTE — Progress Notes (Signed)
Documented for [employer], NT - forgot to document prior to leaving the floor

## 2020-01-19 NOTE — Progress Notes (Signed)
Patient is postop day #1 status post irrigation and debridement left septic knee.  Is lying in bed appears comfortable.  Immobilizer is not on his knee.  Dressing is intact.  Has 2 checks on his wound VAC no drainage in the canister   Mobilize with PT today mustache immobilizer on all times have reorder this.  Cultures pending

## 2020-01-19 NOTE — Progress Notes (Signed)
HOSPITAL MEDICINE OVERNIGHT EVENT NOTE    Notified by nursing that patient has been exhibiting substantial tachycardia occasionally to the 130s since bony debridement of the left knee performed by Dr. Sharol Given earlier in the day on 11/3.  Patient complains of substantial left knee pain requiring frequent doses of as needed Dilaudid intravenously.  Chart reviewed, multiple potential causes of substantial tachycardia.  EKG obtained revealing sinus tachycardia with notable T wave inversions in the inferior leads.  Of note, patient denies chest pain.  Per nursing examination, dressing is clean dry and intact with no obvious evidence of bleeding.  Patient is afebrile.  Patient is not in respiratory distress.  It turns out patient also has recent diagnosis of bilateral submassive pulmonary emboli and has been on heparin infusion up until right before surgery.  I have called the overnight orthopedic provider covering for Dr. Sharol Given,  Dr. Durward Fortes, who stated that it is currently safe to restart the heparin infusion.  I have spoken to pharmacy and they will initiate this right away.  I have additionally obtained a CBC which reveals that hemoglobin is essentially stable from prior readings.  Lactic acid is elevated however at 2.7.  We will bolus patient with normal saline repeat lactic acid afterwards.  Proceeding with treating pain, bolusing with IV fluids and initiating heparin infusion.  Monitoring closely.  Jason Emerald  MD [facility]

## 2020-01-19 NOTE — Progress Notes (Signed)
   01/18/20 2005  Assess: MEWS Score  Temp 98.4 F (36.9 C)  BP (!) 159/84  Pulse Rate (!) 128  ECG Heart Rate (!) 130  Resp 18  Level of Consciousness Alert  SpO2 97 %  O2 Device Room Air  Assess: MEWS Score  MEWS Temp 0  MEWS Systolic 0  MEWS Pulse 3  MEWS RR 0  MEWS LOC 0  MEWS Score 3  MEWS Score Color Yellow  Assess: if the MEWS score is Yellow or Red  Were vital signs taken at a resting state? Yes  Focused Assessment Change from prior assessment (see assessment flowsheet)  Early Detection of Sepsis Score *See Row Information* Low  MEWS guidelines implemented *See Row Information* Yes  Take Vital Signs  Increase Vital Sign Frequency  Yellow: Q 2hr X 2 then Q 4hr X 2, if remains yellow, continue Q 4hrs  Escalate  MEWS: Escalate Yellow: discuss with charge nurse/RN and consider discussing with provider and RRT  Notify: Charge Nurse/RN  Name of Charge Nurse/RN Notified Deborah, RN  Date Charge Nurse/RN Notified 01/18/20  Time Charge Nurse/RN Notified 2015  Notify: Provider  Provider Name/Title Marlyce Huge, MD  Date Provider Notified 01/18/20  Time Provider Notified 2245  Notification Type Page  Notification Reason Change in status  Response See new orders  Date of Provider Response 01/18/20  Time of Provider Response 2245  Document  Patient Outcome Stabilized after interventions  Progress note created (see row info) Yes

## 2020-01-19 NOTE — Progress Notes (Signed)
PROGRESS NOTE    Jason [name]  OEU:[id number] DOB: 1965-06-24 DOA: 01/11/2020 PCP: Patient, No Pcp Per  Brief Narrative:  54 year old  HTN right AKA 11/2011 secondary to prosthetic infection cardiac cath 07/2016 found to be with normal coronaries EF 50% known chronic opioid dependence ?  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-blood culture grew strep angina gnosis wound culture in OR grew strep angina gnosis strep para sanguinous and strep mitis strep salivary us-it has been suspected that he is manipulating and self inoculating infecting his left knee  admitted with chest pain on admission Eventually found to have concerns for infection in the left knee   Assessment & Plan:   Principal Problem:   Bilateral pulmonary embolism ([place]) Active Problems:   Essential hypertension   S/P AKA (above knee amputation) unilateral, right (HCC)   Other chronic pain   Septic arthritis (HCC)   Syncope   Elevated troponin   Chest pain   Bright red blood per rectum   Anemia   Encounter for central line placement   Osteomyelitis of left knee region ([place])   1. Chest pain a. Noncardiac ruled out by cardiology, see below 2. Tachycardia hypotension 11/3 a. Could be secondary to submassive PE versus septic response from recent procedure b. Bolused IV fluid overnight 11/3 and improved c. Monitor trends 3. bilateral submassive pulmonary embolisms PSI score 1 a. Found on scan of chest b. Pulmonology consulted given need for surgical intervention and have cleared him although he is relatively high risk given recent PE c. Overall needs 3 to 6 months of anticoagulation 4. syncopal collapse a. Probably secondary to pulmonary embolism 5. septic arthritis in the setting of left AKA 11/2011 with recurrent admissions to [facility] 6. 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 Underwent on 11/3 excisional debridement and bony resection of distal femur and proximal tibia along with stimulant antibiotic beads placed vancomycin gentamicin by Dr. Sharol Given b. Await bone cultures to guide therapy c. Patient requesting increasing pain controlled therefore increase to Dilaudid 2 mg every 3 hourly and will be changing back in the next 24 hours as per my discussion with him to 1 mg d. Continuing at this time Unasyn and appreciate ID follow-up and input 7. mild hypokalemia a. Replacing with K. Dur 40 mEq daily b. Resolved magnesium slightly low 8. ?  Bright red blood 9. chronic anemia a. Expected blood loss anemia probably from recent surgery b. Recheck labs transfusion threshold below 7  DVT prophylaxis: Currently on heparin Code Status: Full CODE STATUS Family Communication: No family present currently Disposition: Inpatient  Status is: Inpatient  Remains inpatient appropriate because:Hemodynamically unstable, Persistent severe electrolyte disturbances, Unsafe d/c plan and IV treatments appropriate due to intensity of illness or inability to take PO   Dispo: The patient is from: Home              Anticipated d/c is to: Home              Anticipated d/c date is: 2 days              Patient currently is not medically stable to d/c.       Consultants:   Orthopedics  ID  Cardiology  Pulmonology  Procedures: CT chest Echo I&D of the left knee 10/28 Dr. Doreatha Martin  Antimicrobials: Unasyn currently   Subjective: Overnight events noted with  tachycardia in addition to low blood pressure Currently seems better after initiation of heparin and fluids that were bolused He tells me he had a little bit of chest discomfort at the time that this was occurring He is asking for pain meds today but otherwise seems fine  Objective: Vitals:   01/19/20 0000 01/19/20 0316 01/19/20 0704 01/19/20 0810  BP:  (!) 145/76  (!) 153/82   Pulse: (!) 116 (!) 112  98  Resp:    15  Temp:  99.5 F (37.5 C) 98.8 F (37.1 C) 98.4 F (36.9 C)  TempSrc:  Oral Oral Oral  SpO2: 95% 97%  95%  Weight:      Height:        Intake/Output Summary (Last 24 hours) at 01/19/2020 0849 Last data filed at 01/19/2020 0800 Gross per 24 hour  Intake 1519.92 ml  Output 1600 ml  Net -80.08 ml   Filed Weights   01/14/20 0326 01/18/20 0633 01/18/20 1404  Weight: 79.4 kg 83.7 kg 83.7 kg    Examination:  General exam: EOMI NCAT looks stated age Respiratory system: Clinically clear no rales rhonchi Cardiovascular system: S1-S2 no murmur rub gallop RRR Gastrointestinal system: Abdomen soft no rebound no guarding. Central nervous system:No focal deficit Extremities: AKA on right side he has a Coban dressing around the left knee Skin: As above Psychiatry: Intact euthymic and congruent  Data Reviewed: I have personally reviewed following labs and imaging studies potassium 3.3-->4.0 BUNs/creatinine 12/0.77-->10/0.8 WBC 12.2-->14.1, hemoglobin 8.4-->7.4  Radiology Studies: No results found.   Scheduled Meds: . sodium chloride   Intravenous Once  . acetaminophen  1,000 mg Oral Q6H  . Chlorhexidine Gluconate Cloth  6 each Topical Daily  . docusate sodium  100 mg Oral BID  . gabapentin  300 mg Oral TID  . methocarbamol  1,000 mg Oral Q8H  . sodium chloride flush  10-40 mL Intracatheter Q12H   Continuous Infusions: . sodium chloride 125 mL/hr (01/19/20 0740)  . ampicillin-sulbactam (UNASYN) IV 3 g (01/19/20 0535)  . heparin 1,550 Units/hr (01/19/20 0316)  . methocarbamol (ROBAXIN) IV       LOS: 8 days    Time spent: [place], MD [facility] To contact the attending provider between 7A-7P or the covering provider during after hours 7P-7A, please log into the web site [email or web address] and access using universal [place] password for that web site. If you do not have the password, please call the hospital  operator.  01/19/2020, 8:49 AM

## 2020-01-19 NOTE — Progress Notes (Signed)
[place] for Heparin Indication: pulmonary embolus  Allergies  Allergen Reactions  . Ivp Dye [Iodinated Diagnostic Agents] Anaphylaxis    Can be pre-treated with Benadryl  . Metrizamide Anaphylaxis  . Other Other (See Comments)    Under no circumstances will the patient agree to a PICC line  . Reglan [Metoclopramide] Anaphylaxis  . Shellfish-Derived Products Anaphylaxis  . Codeine Hives  . Lidocaine Other (See Comments) and Rash    Pt not sure if this is an actual allergy - might have been a one time incident  . Methadone Hives  . Morphine Swelling and Rash    Local reaction to IV being pushed too fast  . Propoxyphene Hives  . Toradol [Ketorolac Tromethamine] Itching    Patient having systemic itching after receiving IV dose  . Iodine Rash    Reports localized reaction at IV site. Able to tolerate with Benadryl.   . Tape Rash  . Vancomycin Rash    Has had vancomycin since this reaction and had no reaction at all    Patient Measurements: Height: 5\' 10"  (177.8 cm) Weight: 83.7 kg (184 lb 8.4 oz) IBW/kg (Calculated) : 73 Heparin Dosing Weight: 79.4 kg  Vital Signs: Temp: 98.4 F (36.9 C) (11/03 2005) Temp Source: Oral (11/03 2005) BP: 159/84 (11/03 2005) Pulse Rate: 128 (11/03 2005)  Labs: Recent Labs    01/17/20 0434 01/17/20 0434 01/17/20 1230 01/17/20 2000 01/18/20 0515 01/18/20 0516 01/18/20 2244  HGB 8.4*   < >  --   --   --  8.7* 8.3*  HCT 28.0*  --   --   --   --  28.8* 27.5*  PLT 358  --   --   --   --  377 393  HEPARINUNFRC 0.23*   < > 0.16* 0.35 0.44  --   --   CREATININE  --   --   --   --   --  0.77 0.93  TROPONINIHS  --   --   --   --   --   --  34*   < > = values in this interval not displayed.    Estimated Creatinine Clearance: 97 mL/min (by C-G formula based on SCr of 0.93 mg/dL).   Medical History: Past Medical History:  Diagnosis Date  . Allergy   . Anxiety   . Arthritis    left shoulder  .  Bronchitis   . Chest pain 07/14/2016  . Complication of anesthesia   . DJD (degenerative joint disease) 11/29/2011  . Hypertension   . Neuromuscular disorder ([place])    carpal tunnel bilateral, ulner nerve surgery  . PONV (postoperative nausea and vomiting)   . Septic arthritis of knee, right ([place]) 11/29/2011  . Spinal headache    "long time ago"   Assessment: 54 yr old male POD#2 I&D L knee and L anterior thigh abscess. Pharmacy consulted 10/30 to switch IV heparin to Lovenox for possible pulmonary embolism due to difficult IV access, need for central line placement and CTA.   10/31: CTA showed bilateral non-occlusive PE. Pharmacy now consulted to switch back to heparin with plans to return to OR for repeat I&D.    No bleeding noted.  Heparin drip 1550 uts/hr HL 0.44 at goal.   Heparin currently off for I&D.   11/4 AM update:  Heparin was off 11/3 for I&D Dr. Cyd Silence ([facility]) spoke with on call ortho MD, ok to re-start heparin  Goal of  Therapy:   Heparin level 0.3-0.7 units/mL Monitor platelets by anticoagulation protocol: Yes   Plan:  Re-start heparin at 1550 units/hr 0900 heparin level  Narda Bonds, PharmD, BCPS Clinical Pharmacist Phone: [phone]

## 2020-01-19 NOTE — Progress Notes (Addendum)
CSW spoke with patient this morning. Request for CSW to follow up tomorrow morning on dc plan SNF vrs. HH. CSW will follow up with patient tomorrow morning.  CSW will continue to follow.

## 2020-01-20 ENCOUNTER — Inpatient Hospital Stay (HOSPITAL_COMMUNITY): Payer: Medicare Other

## 2020-01-20 DIAGNOSIS — I2699 Other pulmonary embolism without acute cor pulmonale: Secondary | ICD-10-CM | POA: Diagnosis not present

## 2020-01-20 LAB — COMPREHENSIVE METABOLIC PANEL
ALT: 8 U/L (ref 0–44)
AST: 13 U/L — ABNORMAL LOW (ref 15–41)
Albumin: 2.5 g/dL — ABNORMAL LOW (ref 3.5–5.0)
Alkaline Phosphatase: 75 U/L (ref 38–126)
Anion gap: 9 (ref 5–15)
BUN: 5 mg/dL — ABNORMAL LOW (ref 6–20)
CO2: 28 mmol/L (ref 22–32)
Calcium: 8.7 mg/dL — ABNORMAL LOW (ref 8.9–10.3)
Chloride: 102 mmol/L (ref 98–111)
Creatinine, Ser: 0.77 mg/dL (ref 0.61–1.24)
GFR, Estimated: >60 mL/min (ref >60)
Glucose, Bld: 104 mg/dL — ABNORMAL HIGH (ref 70–99)
Potassium: 3.5 mmol/L (ref 3.5–5.1)
Sodium: 139 mmol/L (ref 135–145)
Total Bilirubin: 0.5 mg/dL (ref 0.3–1.2)
Total Protein: 5.9 g/dL — ABNORMAL LOW (ref 6.5–8.1)

## 2020-01-20 LAB — CBC WITH DIFFERENTIAL/PLATELET
Abs Immature Granulocytes: 0.15 10*3/uL — ABNORMAL HIGH (ref 0.00–0.07)
Basophils Absolute: 0 10*3/uL (ref 0.0–0.1)
Basophils Relative: 0 %
Eosinophils Absolute: 0.4 10*3/uL (ref 0.0–0.5)
Eosinophils Relative: 3 %
HCT: 21.7 % — ABNORMAL LOW (ref 39.0–52.0)
Hemoglobin: 6.5 g/dL — CL (ref 13.0–17.0)
Immature Granulocytes: 1 %
Lymphocytes Relative: 14 %
Lymphs Abs: 1.6 10*3/uL (ref 0.7–4.0)
MCH: 24.9 pg — ABNORMAL LOW (ref 26.0–34.0)
MCHC: 30 g/dL (ref 30.0–36.0)
MCV: 83.1 fL (ref 80.0–100.0)
Monocytes Absolute: 1 10*3/uL (ref 0.1–1.0)
Monocytes Relative: 8 %
Neutro Abs: 8.7 10*3/uL — ABNORMAL HIGH (ref 1.7–7.7)
Neutrophils Relative %: 74 %
Platelets: 303 10*3/uL (ref 150–400)
RBC: 2.61 MIL/uL — ABNORMAL LOW (ref 4.22–5.81)
RDW: 19.8 % — ABNORMAL HIGH (ref 11.5–15.5)
WBC: 11.8 10*3/uL — ABNORMAL HIGH (ref 4.0–10.5)
nRBC: 0 % (ref 0.0–0.2)

## 2020-01-20 LAB — HEPARIN LEVEL (UNFRACTIONATED): Heparin Unfractionated: 0.45 IU/mL (ref 0.30–0.70)

## 2020-01-20 LAB — PREPARE RBC (CROSSMATCH)

## 2020-01-20 LAB — HEMOGLOBIN AND HEMATOCRIT, BLOOD
HCT: 26.1 % — ABNORMAL LOW (ref 39.0–52.0)
Hemoglobin: 8 g/dL — ABNORMAL LOW (ref 13.0–17.0)

## 2020-01-20 MED ORDER — HYDROCORTISONE NA SUCCINATE PF 250 MG IJ SOLR: 200.0000 mg | Freq: Once | INTRAMUSCULAR | Status: DC

## 2020-01-20 MED ORDER — HYDROCORTISONE NA SUCCINATE PF 100 MG IJ SOLR
200.0000 mg | Freq: Once | INTRAMUSCULAR | Status: AC
  Administered 2020-01-20: 200 mg via INTRAVENOUS
  Filled 2020-01-20: qty 4

## 2020-01-20 MED ORDER — DIPHENHYDRAMINE HCL 50 MG/ML IJ SOLN: 50.0000 mg | Freq: Once | INTRAMUSCULAR | Status: DC

## 2020-01-20 MED ORDER — SODIUM CHLORIDE 0.9% IV SOLUTION: Freq: Once | INTRAVENOUS | Status: AC

## 2020-01-20 MED ORDER — DIPHENHYDRAMINE HCL 25 MG PO CAPS: 50.0000 mg | ORAL_CAPSULE | Freq: Once | ORAL | Status: DC

## 2020-01-20 NOTE — Progress Notes (Addendum)
Physical Therapy Treatment Patient Details Name: Jason [name] MRN: [id number] DOB: 1966/01/08 Today's Date: 01/20/2020    History of Present Illness Pt adm lt septic knee arthritis and lt distal femur/proximal tibia osteomyelitis. On 10/28 underwent I&D of lt knee and lt anterior thigh abscess and placement of antibiotic beads and cement. On 10/30 pt found to have bil PE. On 11/3 pt underwent Arthrotomy left knee and excisional debridement soft tissue and capsule, Bony resection of the distal femur and proximal tibia resected for osteomyelitis. and placement of incisional wound VAC. PMH - Rt AKA, rt thr, Munchausen syndrome, HTN, anxiety, arthritis, multiple shoulder surgeries.     PT Comments    Pt continues to progress very slowly with self limiting behaviors. Pt able to come to EOB. Pt doesn't feel his prosthetic RLE will fit at this time due to weight loss and I doubt he will be able to hop on his LLE so doubt he will be amb at dc. We did discuss performing scoot transfers to his w/c and functioning initially at a w/c level. Pt was agreeable to this plan. Will work toward w/c transfers.    Follow Up Recommendations  Home health PT;Supervision for mobility/OOB (pt refusing SNF)     Equipment Recommendations  None recommended by PT    Recommendations for Other Services       Precautions / Restrictions Precautions Precautions: Fall Required Braces or Orthoses: Knee Immobilizer - Left Knee Immobilizer - Left: On when out of bed or walking Restrictions Weight Bearing Restrictions: Yes LLE Weight Bearing: Weight bearing as tolerated    Mobility  Bed Mobility Overal bed mobility: Needs Assistance Bed Mobility: Supine to Sit;Sit to Supine     Supine to sit: Min assist Sit to supine: Min assist   General bed mobility comments: Pt able to come to long sitting with supervision. Min assist to move LLE off EOB so pt could sit EOB and assist to bring RLE back into bed when returning  to supine.  Transfers                 General transfer comment: Discussed lateral scoot transfer to chair but pt refused.   Ambulation/Gait                 Stairs             Wheelchair Mobility    Modified Rankin (Stroke Patients Only)       Balance Overall balance assessment: Needs assistance (for long sitting only required B UE support to maintain sitting.) Sitting-balance support: Feet supported;No upper extremity supported (I supported LLE in elevated position due to pain) Sitting balance-Leahy Scale: Fair                                      Cognition Arousal/Alertness: Awake/alert Behavior During Therapy: WFL for tasks assessed/performed Overall Cognitive Status: Within Functional Limits for tasks assessed                                 General Comments: hx of suspected Munchausen disorder.      Exercises      General Comments        Pertinent Vitals/Pain Pain Assessment: 0-10 Pain Score: 7  Pain Location: abdomen and LLE Pain Descriptors / Indicators: Guarding;Grimacing Pain Intervention(s): Monitored during session;Repositioned  Home Living               Home Equipment: Shower seat;Crutches;Wheelchair - manual      Prior Function            PT Goals (current goals can now be found in the care plan section) Acute Rehab PT Goals Patient Stated Goal: I need better pain management Progress towards PT goals: Progressing toward goals (very slowlly)    Frequency    Min 3X/week      PT Plan Discharge plan needs to be updated;Frequency needs to be updated    Co-evaluation              AM-PAC PT "6 Clicks" Mobility   Outcome Measure  Help needed turning from your back to your side while in a flat bed without using bedrails?: A Little Help needed moving from lying on your back to sitting on the side of a flat bed without using bedrails?: None Help needed moving to and from a  bed to a chair (including a wheelchair)?: Total Help needed standing up from a chair using your arms (e.g., wheelchair or bedside chair)?: Total Help needed to walk in hospital room?: Total Help needed climbing 3-5 steps with a railing? : Total 6 Click Score: 11    End of Session   Activity Tolerance: Patient limited by pain (behavior) Patient left: in bed;with call bell/phone within reach Nurse Communication: Mobility status PT Visit Diagnosis: Other abnormalities of gait and mobility (R26.89);Pain Pain - Right/Left: Left Pain - part of body: Knee;Leg     Time: 1308-6578 PT Time Calculation (min) (ACUTE ONLY): 16 min  Charges:  $Therapeutic Activity: 8-22 mins                     [place] Pager [phone] Office [place] 01/20/2020, 5:21 PM

## 2020-01-20 NOTE — Progress Notes (Signed)
HOSPITAL MEDICINE OVERNIGHT EVENT NOTE    Notified by nursing that hemoglobin has decreased to 6.5, down from 7.4 yesterday.  While patient does report intermittent blood in stool and melena this has not been confirmed by any medical staff.  Per nursing, knee dressing is clean dry and intact with no evidence of active bleeding.  At this time I do not believe that the patient's anemia is secondary to the patient's ongoing heparin infusion and therefore this will be continued at this time.  Upon review of CBC today compared to yesterday, all cell lines have dropped approximately the same percentage suggesting that this may be hemodilution considering continuous fluids of 125 cc an hour.  We will discontinue IV fluids.  Order type and screen as well as 1 unit packed red blood cell transfusion.  We will continue to monitor hemoglobin and hematocrit with serial CBCs.  Vernelle Emerald  MD [facility]

## 2020-01-20 NOTE — TOC Initial Note (Signed)
Transition of Care [facility]) - Initial/Assessment Note    Patient Details  Name: Jason [name] MRN: [id number] Date of Birth: [birth year]  Transition of [facility]) CM/SW Contact:    Trula Ore, [place] Phone Number: 01/20/2020, 12:21 PM  Clinical Narrative:                  CSW received consult for possible SNF placement at time of discharge. CSW spoke with patient regarding PT recommendation of SNF placement at time of discharge.  Patient expressed understanding of PT recommendation and declined SNF placement at time of discharge. Patient reports preference for [facility] services at time of discharge . Patient comes from home alone. Patient says he has a son that comes by and checks on him frequently. Patient also stated that his neighbor which is his landlord that can help him.Patient has received the COVID vaccines. No further questions reported at this time. CSW to continue to follow and assist with discharge planning needs.  Expected Discharge Plan: [place] Barriers to Discharge: Continued Medical Work up   Patient Goals and CMS Choice Patient states their goals for this hospitalization and ongoing recovery are:: to go home with [facility] services CMS Medicare.gov Compare Post Acute Care list provided to:: Patient Choice offered to / list presented to : Patient  Expected Discharge Plan and Services Expected Discharge Plan: [place]   Discharge Planning Services: CM Consult Post Acute Care Choice: Home Health (has wheelchair) Living arrangements for the past 2 months: [place] Arranged: PT, OT HH Agency: [place] Date [place]: 01/16/20 Time HH Agency Contacted: 44 Representative spoke with at [place]: Adela Lank  Prior Living Arrangements/Services Living arrangements for the past 2 months: [place] Lives with:: Self Patient language and need for interpreter  reviewed:: Yes Do you feel safe going back to the place where you live?: Yes      Need for Family Participation in Patient Care: Yes (Comment) Care giver support system in place?: Yes (comment) Current home services: DME Criminal Activity/Legal Involvement Pertinent to Current Situation/Hospitalization: No - Comment as needed  Activities of Daily Living Home Assistive Devices/Equipment: Crutches, Shower chair with back, Environmental consultant (specify type), Wheelchair ADL Screening (condition at time of admission) Patient's cognitive ability adequate to safely complete daily activities?: Yes Is the patient deaf or have difficulty hearing?: No Does the patient have difficulty seeing, even when wearing glasses/contacts?: No Does the patient have difficulty concentrating, remembering, or making decisions?: No Patient able to express need for assistance with ADLs?: Yes Does the patient have difficulty dressing or bathing?: No Independently performs ADLs?: Yes (appropriate for developmental age) Does the patient have difficulty walking or climbing stairs?: Yes Weakness of Legs: Left Weakness of Arms/Hands: None  Permission Sought/Granted Permission sought to share information with : Case Manager, Family Supports, Chartered certified accountant granted to share information with : Yes, Verbal Permission Granted     Permission granted to share info w AGENCY: HH        Emotional Assessment   Attitude/Demeanor/Rapport: Gracious Affect (typically observed): Calm Orientation: : Oriented to Self, Oriented to Place, Oriented to  Time, Oriented to Situation Alcohol / Substance Use: Not Applicable Psych Involvement: No (comment)  Admission diagnosis:  Chest pain [R07.9] Syncope, unspecified syncope  type [R55] Pyogenic arthritis of left knee joint, due to unspecified organism [facility]) [M00.9] Patient Active Problem List   Diagnosis Date Noted  . Osteomyelitis of left knee region ([place])   .  Bilateral pulmonary embolism ([place]) 01/15/2020  . Encounter for central line placement   . Septic arthritis ([place]) 01/11/2020  . Syncope 01/11/2020  . Elevated troponin 01/11/2020  . Chest pain 01/11/2020  . Bright red blood per rectum 01/11/2020  . Anemia 01/11/2020  . Pain management contract broken 09/13/2018  . Benzodiazepine dependence, continuous ([place]) 12/25/2017  . Amputation stump pain ([place]) 11/18/2017  . Opiate dependence ([place]) 05/06/2017  . Primary insomnia 05/06/2017  . Functional diarrhea 05/06/2017  . Phantom limb pain ([place]) 05/06/2017  . Muscle atrophy of lower extremity 05/06/2017  . Other chronic pain 05/06/2017  . Essential hypertension 07/14/2016  . S/P AKA (above knee amputation) unilateral, right ([place]) 07/14/2016  . Shoulder impingement syndrome, left 07/29/2011   PCP:  Patient, No Pcp Per Pharmacy:   [place], [place] [address] Phone: [phone] Fax: [phone]  [id number] [place], [address] [place] [address] Phone: [phone] Fax: [place], [place] [address] Phone: [phone] Fax: [phone]     Social Determinants of Health (SDOH) Interventions    Readmission Risk Interventions No flowsheet data found.

## 2020-01-20 NOTE — Progress Notes (Signed)
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-blood culture grew strep angina gnosis wound culture in OR grew strep angina gnosis strep para sanguinous and strep mitis strep salivary us-it has been suspected that he is manipulating and self inoculating infecting his left knee  admitted with chest pain on admission Eventually found to have concerns for infection in the left knee   Assessment & Plan:   Principal Problem:   Bilateral pulmonary embolism ([place]) Active Problems:   Essential hypertension   S/P AKA (above knee amputation) unilateral, right (HCC)   Other chronic pain   Septic arthritis (HCC)   Syncope   Elevated troponin   Chest pain   Bright red blood per rectum   Anemia   Encounter for central line placement   Osteomyelitis of left knee region ([place])   1. Chest pain a. Noncardiac ruled out by cardiology, see below 2. Tachycardia hypotension 11/3 and probably secondary to subacute blood loss a. Could be secondary to submassive PE versus septic response from recent procedure b. Bolused IV fluid overnight 11/3 and improved c. Transfusing blood 11/5 3. Acute anemia without overt bleeding a. Transfusing 1 unit PRBC 11/5 for drop in hemoglobin to 6.5 b. Can continue heparin 4. bilateral submassive pulmonary embolisms PSI score 1 a. Found on scan of chest b. Pulmonology consulted given need for surgical intervention and have cleared him although he is relatively high risk given recent PE c. Overall needs 3 to 6 months of  anticoagulation 5. syncopal collapse a. Probably secondary to pulmonary embolism 6. septic arthritis in the setting of left AKA 11/2011 with recurrent admissions to [facility] 7. 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 Underwent on 11/3 excisional debridement and bony resection of distal femur and proximal tibia along with stimulant antibiotic beads placed vancomycin gentamicin by Dr. Sharol Given b. Infectious disease saw the patient in consult and follow-up on recommending long-term IV antibiotics which patient will complete probably in the hospital and then short duration of oral antibiotics on discharge c. Patient requesting increasing pain controlled therefore increase to Dilaudid 2 mg every 3 hourly and will be changing back in the next 24 hours as per my discussion with him to 1 mg d. Continuing at this time Unasyn and appreciate ID follow-up and input 8. Abdominal pain query cause a. Patient is complaining today of left lower quadrant pain b. Abdominal x-ray two-view negative therefore will proceed to CT scan-if no findings would not augment pain meds 9. mild hypokalemia a. Replacing with K. Dur 40 mEq daily and resolved b. Resolved magnesium slightly low 10. ?  Bright red blood 11. chronic anemia a. Expected blood loss anemia probably from recent surgery b. Recheck labs transfusion threshold below 7  DVT prophylaxis: Currently on heparin Code Status: Full CODE STATUS Family Communication: No family present currently Disposition: Inpatient  Status is: Inpatient  Remains inpatient appropriate because:Hemodynamically unstable, Persistent severe electrolyte disturbances, Unsafe d/c plan and IV treatments appropriate due to intensity of illness or inability to take PO   Dispo: The patient is from: Home  Anticipated d/c is to: Home              Anticipated d/c date is: > 3 days              Patient currently is not medically stable to d/c.       Consultants:    Orthopedics  ID  Cardiology  Pulmonology  Procedures: CT chest Echo I&D of the left knee 10/28 Dr. Doreatha Martin  Antimicrobials: Unasyn currently   Subjective: Claims abdominal pain left lower quadrant which is severe and almost "brought him to tears" quotation Otherwise seems comfortable at bedside No reports of bleeding No fever no chills  Objective: Vitals:   01/20/20 0745 01/20/20 0810 01/20/20 1109 01/20/20 1209  BP: 123/76 130/71 127/80 (!) 158/78  Pulse: 92   69  Resp: 17 17 17 18   Temp: 98.8 F (37.1 C) 98.5 F (36.9 C) 98.4 F (36.9 C) 98.4 F (36.9 C)  TempSrc: Oral Oral Oral Oral  SpO2:  98%  98%  Weight:      Height:        Intake/Output Summary (Last 24 hours) at 01/20/2020 1655 Last data filed at 01/20/2020 1249 Gross per 24 hour  Intake 3118.37 ml  Output 2700 ml  Net 418.37 ml   Filed Weights   01/14/20 0326 01/18/20 0633 01/18/20 1404  Weight: 79.4 kg 83.7 kg 83.7 kg    Examination:  Euthymic congruent no distress clinically clear no added sound chest exam S1-S2 no murmur Abdomen soft slightly tender in left lower quadrant however does not appear distended  Data Reviewed: I have personally reviewed following labs and imaging studies potassium 3.3-->4.0-->3.5 BUNs/creatinine 12/0.77-->10/0.8-->5/0.7 WBC 12.2-->14.1-->11.8 , hemoglobin 8.4--> 6.5-->8.0 currently  Radiology Studies: DG Abd 2 Views  Result Date: 01/20/2020 CLINICAL DATA:  Abdominal pain. EXAM: ABDOMEN - 2 VIEW COMPARISON:  None. FINDINGS: The bowel gas pattern is normal. There is no evidence of free air. No radio-opaque calculi or other significant radiographic abnormality is seen. IMPRESSION: Negative. Electronically Signed   By: Marijo Conception M.D.   On: 01/20/2020 12:51     Scheduled Meds: . sodium chloride   Intravenous Once  . acetaminophen  1,000 mg Oral Q6H  . Chlorhexidine Gluconate Cloth  6 each Topical Daily  . docusate sodium  100 mg Oral BID  . gabapentin   300 mg Oral TID  . methocarbamol  1,000 mg Oral Q8H  . sodium chloride flush  10-40 mL Intracatheter Q12H   Continuous Infusions: . ampicillin-sulbactam (UNASYN) IV 3 g (01/20/20 1117)  . heparin 1,550 Units/hr (01/20/20 0731)  . methocarbamol (ROBAXIN) IV       LOS: 9 days    Time spent: [place], MD [facility] To contact the attending provider between 7A-7P or the covering provider during after hours 7P-7A, please log into the web site [email or web address] and access using universal [place] password for that web site. If you do not have the password, please call the hospital operator.  01/20/2020, 4:55 PM

## 2020-01-20 NOTE — Progress Notes (Signed)
CRITICAL VALUE ALERT  Critical Value: Hgb 6.5   Date & Time Notied:  01/20/2019 0400  Provider Notified:   Orders Received/Actions taken:

## 2020-01-20 NOTE — Plan of Care (Signed)
  Problem: Education: Goal: Knowledge of General Education information will improve Description: Including pain rating scale, medication(s)/side effects and non-pharmacologic comfort measures Outcome: Progressing   Problem: Clinical Measurements: Goal: Ability to maintain clinical measurements within normal limits will improve Outcome: Progressing Goal: Will remain free from infection Outcome: Progressing Goal: Diagnostic test results will improve Outcome: Progressing Goal: Cardiovascular complication will be avoided Outcome: Progressing   Problem: Pain Managment: Goal: General experience of comfort will improve Outcome: Progressing   Problem: Safety: Goal: Ability to remain free from injury will improve Outcome: Progressing

## 2020-01-20 NOTE — Care Management Important Message (Signed)
Important Message  Patient Details  Name: Jason [name] MRN: [id number] Date of Birth: [birth year]   Medicare Important Message Given:  Yes     [name] 01/20/2020, 10:05 AM

## 2020-01-20 NOTE — Progress Notes (Addendum)
HOSPITAL MEDICINE OVERNIGHT EVENT NOTE    Notified by nursing that patient possesses an iodine allergy and oddly enough separate listing of a contrast allergy, each with different severities.  Patient is scheduled to undergo CT imaging of the abdomen and pelvis with contrast.  Contrast premedication order set initiated.  According to CT and pharmacy , elective 13 hour protocol is to be used which has been ordered.  Will proceed with the test after medications are administered per protocol.  Vernelle Emerald  MD [facility]

## 2020-01-20 NOTE — TOC Progression Note (Signed)
Transition of Care [facility]) - Progression Note    Patient Details  Name: Jason [name] MRN: [id number] Date of Birth: [birth year]  [facility] CM/SW Contact  Bartholomew Crews, RN Phone Number: [phone] 01/20/2020, 5:13 PM  Clinical Narrative:     Spoke with patient at the bedside to discuss transition planning. Patient confirmed that his choice is to go home. Stated that he has a next door neighbor who is available and that his girlfriend will check on him at least daily and may even do her work at his home.   He stated that he has a wheelchair, but needs forearm crutches, RW, and 3N1. DME orders placed and referral sent to [facility] for delivery to room.   Discussed previous [facility] agency referral to [facility] for [facility] PT and OT. Patient will need HH orders for PT and OT with Face to Face at discharge.  Confirmed [place] agency choice with [facility].   Patient stated that he had been driving himself to medical appointments, but will have someone drive him or arrange for telehealth appointments.   His preferred pharmacy is [facility] in [place]. He stated that pharmacy is about 10 miles away, and they deliver.   He is not sure about his transportation home, but stated that he is working on those arrangements.   TOC following for transition needs.   Expected Discharge Plan: [place] Barriers to Discharge: Continued Medical Work up  Expected Discharge Plan and Services Expected Discharge Plan: [place]   Discharge Planning Services: CM Consult Post Acute Care Choice: [place] (has wheelchair) Living arrangements for the past 2 months: [place]: PT, OT [place] Agency: [place] Date [place]: 01/16/20 Time [place] Agency Contacted: 1052 Representative spoke with at [place]: Adela Lank   Social Determinants of Health (SDOH) Interventions    Readmission Risk  Interventions No flowsheet data found.

## 2020-01-20 NOTE — Progress Notes (Signed)
[place] for Heparin Indication: pulmonary embolus  Allergies  Allergen Reactions  . Ivp Dye [Iodinated Diagnostic Agents] Anaphylaxis    Can be pre-treated with Benadryl  . Metrizamide Anaphylaxis  . Other Other (See Comments)    Under no circumstances will the patient agree to a PICC line  . Reglan [Metoclopramide] Anaphylaxis  . Shellfish-Derived Products Anaphylaxis  . Codeine Hives  . Lidocaine Other (See Comments) and Rash    Pt not sure if this is an actual allergy - might have been a one time incident  . Methadone Hives  . Morphine Swelling and Rash    Local reaction to IV being pushed too fast  . Propoxyphene Hives  . Toradol [Ketorolac Tromethamine] Itching    Patient having systemic itching after receiving IV dose  . Iodine Rash    Reports localized reaction at IV site. Able to tolerate with Benadryl.   . Tape Rash  . Vancomycin Rash    Has had vancomycin since this reaction and had no reaction at all    Patient Measurements: Height: 5\' 10"  (177.8 cm) Weight: 83.7 kg (184 lb 8.4 oz) IBW/kg (Calculated) : 73 Heparin Dosing Weight: 79.4 kg  Vital Signs: Temp: 98.4 F (36.9 C) (11/05 1209) Temp Source: Oral (11/05 1209) BP: 158/78 (11/05 1209) Pulse Rate: 69 (11/05 1209)  Labs: Recent Labs    01/18/20 0515 01/18/20 0516 01/18/20 2244 01/18/20 2244 01/19/20 0500 01/19/20 1055 01/20/20 0325  HGB  --    < > 8.3*   < > 7.4*  --  6.5*  HCT  --    < > 27.5*  --  24.7*  --  21.7*  PLT  --    < > 393  --  358  --  303  HEPARINUNFRC 0.44  --   --   --   --  0.53 0.45  CREATININE  --    < > 0.93  --  0.83  --  0.77  TROPONINIHS  --   --  34*  --   --   --   --    < > = values in this interval not displayed.    Estimated Creatinine Clearance: 112.8 mL/min (by C-G formula based on SCr of 0.77 mg/dL).   Medical History: Past Medical History:  Diagnosis Date  . Allergy   . Anxiety   . Arthritis    left shoulder  .  Bronchitis   . Chest pain 07/14/2016  . Complication of anesthesia   . DJD (degenerative joint disease) 11/29/2011  . Hypertension   . Neuromuscular disorder ([place])    carpal tunnel bilateral, ulner nerve surgery  . PONV (postoperative nausea and vomiting)   . Septic arthritis of knee, right ([place]) 11/29/2011  . Spinal headache    "long time ago"   Assessment: 54 yr old male POD#2 I&D L knee and L anterior thigh abscess. Pharmacy consulted 10/30 to switch IV heparin to Lovenox for possible pulmonary embolism due to difficult IV access, need for central line placement and CTA.   10/31: CTA showed bilateral non-occlusive PE.   Heparin level this morning is at goal, however CBC continues to trend down with a hgb of 6.5 this morning, plt wnl. No overt bleeding noted. MD aware and plans to transfuse  Goal of Therapy:   Heparin level 0.3-0.7 units/mL Monitor platelets by anticoagulation protocol: Yes   Plan:  Continue heparin at 1550 units/hr Daily CBC   Erin Hearing  PharmD., BCPS Clinical Pharmacist 01/20/2020 12:26 PM

## 2020-01-20 NOTE — Progress Notes (Signed)
Patient postop day 2 status post irrigation and debridement septic left knee.   Vital signs stable afebrile knee immobilizer in room but not inpatient.  Cultures are still pending.   Explained to patient that the immobilizer should probably be in place when he is in bed to avoid any unintentional bending of his knee   Plan for adjustment to antibiotics when cultures finalized

## 2020-01-21 ENCOUNTER — Inpatient Hospital Stay (HOSPITAL_COMMUNITY): Payer: Medicare Other

## 2020-01-21 DIAGNOSIS — I2699 Other pulmonary embolism without acute cor pulmonale: Secondary | ICD-10-CM | POA: Diagnosis not present

## 2020-01-21 LAB — COMPREHENSIVE METABOLIC PANEL
ALT: 8 U/L (ref 0–44)
AST: 11 U/L — ABNORMAL LOW (ref 15–41)
Albumin: 2.7 g/dL — ABNORMAL LOW (ref 3.5–5.0)
Alkaline Phosphatase: 79 U/L (ref 38–126)
Anion gap: 10 (ref 5–15)
BUN: 6 mg/dL (ref 6–20)
CO2: 28 mmol/L (ref 22–32)
Calcium: 8.9 mg/dL (ref 8.9–10.3)
Chloride: 100 mmol/L (ref 98–111)
Creatinine, Ser: 0.77 mg/dL (ref 0.61–1.24)
GFR, Estimated: >60 mL/min (ref >60)
Glucose, Bld: 180 mg/dL — ABNORMAL HIGH (ref 70–99)
Potassium: 4.1 mmol/L (ref 3.5–5.1)
Sodium: 138 mmol/L (ref 135–145)
Total Bilirubin: 0.4 mg/dL (ref 0.3–1.2)
Total Protein: 6.5 g/dL (ref 6.5–8.1)

## 2020-01-21 LAB — CBC WITH DIFFERENTIAL/PLATELET
Abs Immature Granulocytes: 0.21 10*3/uL — ABNORMAL HIGH (ref 0.00–0.07)
Basophils Absolute: 0 10*3/uL (ref 0.0–0.1)
Basophils Relative: 0 %
Eosinophils Absolute: 0 10*3/uL (ref 0.0–0.5)
Eosinophils Relative: 0 %
HCT: 26.4 % — ABNORMAL LOW (ref 39.0–52.0)
Hemoglobin: 8.1 g/dL — ABNORMAL LOW (ref 13.0–17.0)
Immature Granulocytes: 2 %
Lymphocytes Relative: 4 %
Lymphs Abs: 0.5 10*3/uL — ABNORMAL LOW (ref 0.7–4.0)
MCH: 25.2 pg — ABNORMAL LOW (ref 26.0–34.0)
MCHC: 30.7 g/dL (ref 30.0–36.0)
MCV: 82.2 fL (ref 80.0–100.0)
Monocytes Absolute: 0.2 10*3/uL (ref 0.1–1.0)
Monocytes Relative: 2 %
Neutro Abs: 12 10*3/uL — ABNORMAL HIGH (ref 1.7–7.7)
Neutrophils Relative %: 92 %
Platelets: 345 10*3/uL (ref 150–400)
RBC: 3.21 MIL/uL — ABNORMAL LOW (ref 4.22–5.81)
RDW: 19.1 % — ABNORMAL HIGH (ref 11.5–15.5)
WBC: 12.9 10*3/uL — ABNORMAL HIGH (ref 4.0–10.5)
nRBC: 0 % (ref 0.0–0.2)

## 2020-01-21 LAB — HEPARIN LEVEL (UNFRACTIONATED): Heparin Unfractionated: 0.47 IU/mL (ref 0.30–0.70)

## 2020-01-21 MED ORDER — IOHEXOL 300 MG/ML  SOLN
100.0000 mL | Freq: Once | INTRAMUSCULAR | Status: AC | PRN
  Administered 2020-01-21: 100 mL via INTRAVENOUS

## 2020-01-21 MED ORDER — PREDNISONE 20 MG PO TABS
50.0000 mg | ORAL_TABLET | Freq: Four times a day (QID) | ORAL | Status: AC
  Administered 2020-01-21 (×3): 50 mg via ORAL
  Filled 2020-01-21 (×3): qty 2

## 2020-01-21 MED ORDER — HYDROMORPHONE HCL 1 MG/ML IJ SOLN
1.0000 mg | Freq: Once | INTRAMUSCULAR | Status: AC
  Administered 2020-01-21: 1 mg via INTRAVENOUS
  Filled 2020-01-21: qty 1

## 2020-01-21 MED ORDER — DIPHENHYDRAMINE HCL 50 MG/ML IJ SOLN: 50.0000 mg | Freq: Once | INTRAMUSCULAR | Status: AC

## 2020-01-21 MED ORDER — DIPHENHYDRAMINE HCL 25 MG PO CAPS
50.0000 mg | ORAL_CAPSULE | Freq: Once | ORAL | Status: AC
  Administered 2020-01-21: 50 mg via ORAL
  Filled 2020-01-21: qty 2

## 2020-01-21 NOTE — Progress Notes (Addendum)
HOSPITAL MEDICINE OVERNIGHT EVENT NOTE    Notified by nursing that patient is complaining of severe left knee pain since coming back from CT imaging of the abdomen and pelvis earlier this evening.  Shortly after arrival in the medical floor patient's wound VAC has suddenly begun to drain approximately 50 cc of bloody drainage.   Patient continues to complain of 10 out of 10 pain of that knee. Patient additionally is tachycardic in the 140s and hypertensive.  We'll obtain stat EKG, provide patient with 1 mg of IV additional Dilaudid.  Vernelle Emerald  MD [facility]   ADDENDUM (11/7 2AM)  I went to evaluate the patient at the bedside.  He continues to complain of severe left knee and thigh pain, 10 out of 10 intensity.  Patient reports that this pain began immediately after returning from CT yesterday afternoon.  He states that he feels that he may have injured his leg while maneuvering onto the CT scanner table.  On examination, entirety of left lower extremity is dressed however there is significant deformity of the anterior left thigh just proximal to the left knee concerning for a substantial hematoma.  Wound VAC has now drained approximately 100 cc of bloody drainage compared to 50 cc before.  According to nursing, the right drainage has slowed down substantially.  Left foot exhibits excellent pulses with intact sensation.  Capillary refill is somewhat slow however.  After evaluating this patient I have 2 primary concerns:     I am concerned about substantial bleeding into the left thigh and knee.  Patient is vigorously massaging his left thigh while I am in the room and I have strongly instructed him to stop doing as this may be exacerbating the bleeding.  Unfortunately, patient is on a heparin drip for bilateral submassive pulmonary emboli and unless bleeding is life-threatening it should not be stopped at this time.  Will obtain a stat CBC.  Thankfully, bloody drainage in  the wound VAC seems to have slowed down substantially and perhaps stopped. Will call ortho if persists.  My second primary concern is the eventual possibility of developing compartment syndrome.   Patient possesses sensation and pulse of the left foot at this time and therefore clinically I do not believe patient is compartment syndrome.  Will obtain lactic acid and creatine kinase to help assess if there is any evidence of tissue injury.  Will ask nursing to measure the circumference of the thigh twice per shift and monitor closely.  Patient still experiencing substantial pain with associated tachycardia and hypertension.  I have agreed to give him 1 additional dose of 1 mg of Dilaudid.  I have advised him that any further dosing of additional intravenous opiates must be after a discussion with the day provider.  Sherryll Burger Alferd Obryant  ADDENDUM (11/7 4:10AM)  Hemoglobin has decreased somewhat to 7.2 from 8.1 yesterday.  There is a notable worsening leukocytosis however this may be stress related due to patient's substantial pain.  Wound VAC output has slowed dramatically and is currently only at 110 cc.    Creatine kinase is normal which is reassuring.  Lactic acid still pending.  We will schedule an additional CBC in approximately 6 hours.  Continuing to monitor patient closely.  Sherryll Burger Brynleigh Sequeira   ADDENDUM (11/7 4:39am)  Lactic acid 2.1.  Giving 1 liter LR bolus, will repeat lactate.  Discussed patient's condition with Dr. Lorin Mercy with orthopedic surgery (covering for Dr. Sharol Given) who agrees that there is no  indication for urgent surgical intervention currently.  He agrees with management thus far and states that he will physically evaluate the patient later this morning.  Sherryll Burger Chelle Cayton

## 2020-01-21 NOTE — Progress Notes (Signed)
[place] for Heparin Indication: pulmonary embolus  Allergies  Allergen Reactions  . Ivp Dye [Iodinated Diagnostic Agents] Anaphylaxis    Can be pre-treated with Benadryl  . Metrizamide Anaphylaxis  . Other Other (See Comments)    Under no circumstances will the patient agree to a PICC line  . Reglan [Metoclopramide] Anaphylaxis  . Shellfish-Derived Products Anaphylaxis  . Codeine Hives  . Lidocaine Other (See Comments) and Rash    Pt not sure if this is an actual allergy - might have been a one time incident  . Methadone Hives  . Morphine Swelling and Rash    Local reaction to IV being pushed too fast  . Propoxyphene Hives  . Toradol [Ketorolac Tromethamine] Itching    Patient having systemic itching after receiving IV dose  . Iodine Rash    Reports localized reaction at IV site. Able to tolerate with Benadryl.   . Tape Rash  . Vancomycin Rash    Has had vancomycin since this reaction and had no reaction at all    Patient Measurements: Height: 5\' 10"  (177.8 cm) Weight: 80.8 kg (178 lb 2.1 oz) IBW/kg (Calculated) : 73 Heparin Dosing Weight: 79.4 kg  Vital Signs: Temp: 98.2 F (36.8 C) (11/06 0608) Temp Source: Oral (11/06 0608) BP: 134/75 (11/06 0608) Pulse Rate: 82 (11/06 0608)  Labs: Recent Labs    01/18/20 2244 01/18/20 2244 01/19/20 0500 01/19/20 0500 01/19/20 1055 01/20/20 0325 01/20/20 0325 01/20/20 1324 01/21/20 0705 01/21/20 0706  HGB 8.3*   < > 7.4*   < >  --  6.5*   < > 8.0*  --  8.1*  HCT 27.5*   < > 24.7*   < >  --  21.7*  --  26.1*  --  26.4*  PLT 393   < > 358  --   --  303  --   --   --  345  HEPARINUNFRC  --   --   --   --  0.53 0.45  --   --  0.47  --   CREATININE 0.93   < > 0.83  --   --  0.77  --   --   --  0.77  TROPONINIHS 34*  --   --   --   --   --   --   --   --   --    < > = values in this interval not displayed.    Estimated Creatinine Clearance: 112.8 mL/min (by C-G formula based on SCr  of 0.77 mg/dL).   Medical History: Past Medical History:  Diagnosis Date  . Allergy   . Anxiety   . Arthritis    left shoulder  . Bronchitis   . Chest pain 07/14/2016  . Complication of anesthesia   . DJD (degenerative joint disease) 11/29/2011  . Hypertension   . Neuromuscular disorder ([place])    carpal tunnel bilateral, ulner nerve surgery  . PONV (postoperative nausea and vomiting)   . Septic arthritis of knee, right ([place]) 11/29/2011  . Spinal headache    "long time ago"   Assessment: 54 yr old male POD#2 I&D L knee and L anterior thigh abscess. Pharmacy consulted 10/30 to switch IV heparin to Lovenox for possible pulmonary embolism due to difficult IV access, need for central line placement and CTA.   10/31: CTA showed bilateral non-occlusive PE. Pharmacy consulted to switch back to heparin for repeat I&D  Today, heparin level is  therapeutic at 0.47 on 1550 units/hr.  Hgb improved from 6.5 to 8.1 after transfusion yesterday. Plt stable and wnl. No overt bleeding noted. Will continue to monitor closely.  Goal of Therapy:   Heparin level 0.3-0.7 units/mL Monitor platelets by anticoagulation protocol: Yes   Plan:  Continue heparin at 1550 units/hr Daily heparin level, CBC Monitor for s/sx bleeding  Fara Olden, PharmD PGY-1 Pharmacy Resident 01/21/2020 7:57 AM Please see AMION for all pharmacy numbers

## 2020-01-21 NOTE — Plan of Care (Signed)
  Problem: Education: Goal: Knowledge of General Education information will improve Description: Including pain rating scale, medication(s)/side effects and non-pharmacologic comfort measures Outcome: Progressing   Problem: Clinical Measurements: Goal: Ability to maintain clinical measurements within normal limits will improve Outcome: Progressing Goal: Will remain free from infection Outcome: Progressing Goal: Diagnostic test results will improve Outcome: Progressing Goal: Cardiovascular complication will be avoided Outcome: Progressing   Problem: Pain Managment: Goal: General experience of comfort will improve Outcome: Progressing   Problem: Safety: Goal: Ability to remain free from injury will improve Outcome: Progressing

## 2020-01-21 NOTE — Progress Notes (Signed)
Occupational Therapy Treatment Patient Details Name: Jason [name] MRN: [id number] DOB: Jan 16, 1966 Today's Date: 01/21/2020    History of present illness Pt adm lt septic knee arthritis and lt distal femur/proximal tibia osteomyelitis. On 10/28 underwent I&D of lt knee and lt anterior thigh abscess and placement of antibiotic beads and cement. On 10/30 pt found to have bil PE. On 11/3 pt underwent Arthrotomy left knee and excisional debridement soft tissue and capsule, Bony resection of the distal femur and proximal tibia resected for osteomyelitis. and placement of incisional wound VAC. PMH - Rt AKA, rt thr, Munchausen syndrome, HTN, anxiety, arthritis, multiple shoulder surgeries.    OT comments  Patient continues to make minimal to no progress towards goals in skilled OT session. Patient's session encompassed pt scooting his LLE with min A off EOB x3 in long sitting in session. Pt stating, "Im not allowed to let my leg dangle right now, the doctors said they dont want me to." Pt is adamant he will be able to get into a wheelchair, but again refuses to attempt to date. Therapist continued education with importance of mobility, to which patient acknowledges but declines. Discharge remains appropriate; will continue to follow acutely.   Follow Up Recommendations  SNF;Supervision/Assistance - 24 hour    Equipment Recommendations  3 in 1 bedside commode;Wheelchair (measurements OT);Wheelchair cushion (measurements OT)    Recommendations for Other Services      Precautions / Restrictions Precautions Precautions: Fall Required Braces or Orthoses: Knee Immobilizer - Left Knee Immobilizer - Left: On when out of bed or walking Restrictions Weight Bearing Restrictions: Yes RLE Weight Bearing: Non weight bearing LLE Weight Bearing: Weight bearing as tolerated Other Position/Activity Restrictions: wound vac L knee.       Mobility Bed Mobility Overal bed mobility: Needs Assistance              General bed mobility comments: Pt able to come to long sitting with supervision. Min assist to move LLE off EOB but refused to let leg dangle "the doctors said I cant do that right now"  Transfers                 General transfer comment: Discussed lateral scoot transfer to chair but pt refused.     Balance Overall balance assessment: Needs assistance (for long sitting only required B UE support to maintain sitting.) Sitting-balance support: Feet supported;No upper extremity supported (I supported LLE in elevated position due to pain) Sitting balance-Leahy Scale: Fair                                     ADL either performed or assessed with clinical judgement   ADL Overall ADL's : Needs assistance/impaired                           Toilet Transfer Details (indicate cue type and reason): Declines attempt           General ADL Comments: remains self limiting with mobility. Only able to long sit and move LLE slightly to EOB x3 this date before declining and lying back down. Educated pt on importance of OOB, continues to refuse     Vision       Perception     Praxis      Cognition Arousal/Alertness: Awake/alert Behavior During Therapy: WFL for tasks assessed/performed Overall Cognitive Status: Within Functional Limits  for tasks assessed                                 General Comments: hx of suspected Munchausen disorder.        Exercises     Shoulder Instructions       General Comments      Pertinent Vitals/ Pain       Pain Assessment: Faces Faces Pain Scale: Hurts a little bit Pain Location: abdomen and LLE Pain Descriptors / Indicators: Guarding;Grimacing Pain Intervention(s): Limited activity within patient's tolerance;Monitored during session;Repositioned  Home Living                                          Prior Functioning/Environment              Frequency  Min 2X/week         Progress Toward Goals  OT Goals(current goals can now be found in the care plan section)  Progress towards OT goals: Progressing toward goals (slow and minimal progression)  Acute Rehab OT Goals Patient Stated Goal: to keep working with therapy OT Goal Formulation: With patient Time For Goal Achievement: 01/30/20 Potential to Achieve Goals: [place] Discharge plan remains appropriate;Discharge plan needs to be updated    Co-evaluation                 AM-PAC OT "6 Clicks" Daily Activity     Outcome Measure   Help from another person eating meals?: None Help from another person taking care of personal grooming?: A Little Help from another person toileting, which includes using toliet, bedpan, or urinal?: A Lot Help from another person bathing (including washing, rinsing, drying)?: A Lot Help from another person to put on and taking off regular upper body clothing?: A Little Help from another person to put on and taking off regular lower body clothing?: A Lot 6 Click Score: 16    End of Session Equipment Utilized During Treatment: Left knee immobilizer  OT Visit Diagnosis: Pain;Other abnormalities of gait and mobility (R26.89) Pain - Right/Left: Left Pain - part of body: Leg   Activity Tolerance Patient limited by pain;Other (comment) (Self limiting)   Patient Left in bed;with call bell/phone within reach   Nurse Communication Mobility status        Time: 2993-7169 OT Time Calculation (min): 15 min  Charges: OT General Charges $OT Visit: 1 Visit OT Treatments $Self Care/Home Management : 8-22 mins  Corinne Ports E. Emmie Frakes, COTA/L Acute Rehabilitation Services [phone] [place] 01/21/2020, 12:34 PM

## 2020-01-22 DIAGNOSIS — I2699 Other pulmonary embolism without acute cor pulmonale: Secondary | ICD-10-CM | POA: Diagnosis not present

## 2020-01-22 LAB — COMPREHENSIVE METABOLIC PANEL
ALT: 9 U/L (ref 0–44)
AST: 14 U/L — ABNORMAL LOW (ref 15–41)
Albumin: 2.7 g/dL — ABNORMAL LOW (ref 3.5–5.0)
Alkaline Phosphatase: 84 U/L (ref 38–126)
Anion gap: 11 (ref 5–15)
BUN: 12 mg/dL (ref 6–20)
CO2: 26 mmol/L (ref 22–32)
Calcium: 9 mg/dL (ref 8.9–10.3)
Chloride: 99 mmol/L (ref 98–111)
Creatinine, Ser: 0.9 mg/dL (ref 0.61–1.24)
GFR, Estimated: >60 mL/min (ref >60)
Glucose, Bld: 168 mg/dL — ABNORMAL HIGH (ref 70–99)
Potassium: 3.2 mmol/L — ABNORMAL LOW (ref 3.5–5.1)
Sodium: 136 mmol/L (ref 135–145)
Total Bilirubin: 0.3 mg/dL (ref 0.3–1.2)
Total Protein: 6.3 g/dL — ABNORMAL LOW (ref 6.5–8.1)

## 2020-01-22 LAB — CBC
HCT: 23 % — ABNORMAL LOW (ref 39.0–52.0)
HCT: 20 % — ABNORMAL LOW (ref 39.0–52.0)
HCT: 27.3 % — ABNORMAL LOW (ref 39.0–52.0)
Hemoglobin: 7.2 g/dL — ABNORMAL LOW (ref 13.0–17.0)
Hemoglobin: 6.1 g/dL — CL (ref 13.0–17.0)
Hemoglobin: 8.7 g/dL — ABNORMAL LOW (ref 13.0–17.0)
MCH: 25.9 pg — ABNORMAL LOW (ref 26.0–34.0)
MCH: 25.2 pg — ABNORMAL LOW (ref 26.0–34.0)
MCH: 26.9 pg (ref 26.0–34.0)
MCHC: 31.3 g/dL (ref 30.0–36.0)
MCHC: 30.5 g/dL (ref 30.0–36.0)
MCHC: 31.9 g/dL (ref 30.0–36.0)
MCV: 82.7 fL (ref 80.0–100.0)
MCV: 82.6 fL (ref 80.0–100.0)
MCV: 84.5 fL (ref 80.0–100.0)
Platelets: 366 10*3/uL (ref 150–400)
Platelets: 317 10*3/uL (ref 150–400)
Platelets: 275 10*3/uL (ref 150–400)
RBC: 2.78 MIL/uL — ABNORMAL LOW (ref 4.22–5.81)
RBC: 2.42 MIL/uL — ABNORMAL LOW (ref 4.22–5.81)
RBC: 3.23 MIL/uL — ABNORMAL LOW (ref 4.22–5.81)
RDW: 19.1 % — ABNORMAL HIGH (ref 11.5–15.5)
RDW: 19.1 % — ABNORMAL HIGH (ref 11.5–15.5)
RDW: 17.5 % — ABNORMAL HIGH (ref 11.5–15.5)
WBC: 20.9 10*3/uL — ABNORMAL HIGH (ref 4.0–10.5)
WBC: 15.7 10*3/uL — ABNORMAL HIGH (ref 4.0–10.5)
WBC: 12.8 10*3/uL — ABNORMAL HIGH (ref 4.0–10.5)
nRBC: 0 % (ref 0.0–0.2)
nRBC: 0 % (ref 0.0–0.2)
nRBC: 0 % (ref 0.0–0.2)

## 2020-01-22 LAB — LACTIC ACID, PLASMA
Lactic Acid, Venous: 2.1 mmol/L (ref 0.5–1.9)
Lactic Acid, Venous: 1.7 mmol/L (ref 0.5–1.9)

## 2020-01-22 LAB — HEPARIN LEVEL (UNFRACTIONATED): Heparin Unfractionated: 0.44 IU/mL (ref 0.30–0.70)

## 2020-01-22 LAB — CK: Total CK: 64 U/L (ref 49–397)

## 2020-01-22 LAB — PREPARE RBC (CROSSMATCH)

## 2020-01-22 MED ORDER — LACTATED RINGERS IV BOLUS
1000.0000 mL | Freq: Once | INTRAVENOUS | Status: AC
  Administered 2020-01-22: 1000 mL via INTRAVENOUS

## 2020-01-22 MED ORDER — ACETAMINOPHEN 325 MG PO TABS: 650.0000 mg | ORAL_TABLET | Freq: Once | ORAL | Status: DC

## 2020-01-22 MED ORDER — DIPHENHYDRAMINE HCL 25 MG PO CAPS
25.0000 mg | ORAL_CAPSULE | Freq: Once | ORAL | Status: AC
  Administered 2020-01-22: 25 mg via ORAL
  Filled 2020-01-22: qty 1

## 2020-01-22 MED ORDER — OXYCODONE HCL 5 MG PO TABS
15.0000 mg | ORAL_TABLET | ORAL | Status: DC | PRN
  Administered 2020-01-22 – 2020-01-24 (×9): 15 mg via ORAL
  Filled 2020-01-22 (×9): qty 3

## 2020-01-22 MED ORDER — HYDROMORPHONE HCL 1 MG/ML IJ SOLN
3.0000 mg | INTRAMUSCULAR | Status: DC | PRN
  Administered 2020-01-22 – 2020-01-24 (×11): 3 mg via INTRAVENOUS
  Filled 2020-01-22 (×11): qty 3

## 2020-01-22 MED ORDER — OXYCODONE HCL 5 MG PO TABS: 10.0000 mg | ORAL_TABLET | ORAL | Status: DC | PRN

## 2020-01-22 MED ORDER — FUROSEMIDE 10 MG/ML IJ SOLN
20.0000 mg | Freq: Once | INTRAMUSCULAR | Status: AC
  Administered 2020-01-22: 20 mg via INTRAVENOUS
  Filled 2020-01-22: qty 2

## 2020-01-22 MED ORDER — SODIUM CHLORIDE 0.9 % IV SOLN: INTRAVENOUS | Status: DC

## 2020-01-22 MED ORDER — HYDROMORPHONE HCL 1 MG/ML IJ SOLN
2.0000 mg | INTRAMUSCULAR | Status: DC | PRN
  Administered 2020-01-22 (×2): 2 mg via INTRAVENOUS
  Filled 2020-01-22 (×2): qty 2

## 2020-01-22 MED ORDER — SODIUM CHLORIDE 0.9% IV SOLUTION: Freq: Once | INTRAVENOUS | Status: AC

## 2020-01-22 MED ORDER — POTASSIUM CHLORIDE CRYS ER 20 MEQ PO TBCR
40.0000 meq | EXTENDED_RELEASE_TABLET | Freq: Every day | ORAL | Status: DC
  Administered 2020-01-22 – 2020-02-01 (×11): 40 meq via ORAL
  Filled 2020-01-22 (×11): qty 2

## 2020-01-22 MED ORDER — HYDROMORPHONE HCL 1 MG/ML IJ SOLN
1.0000 mg | Freq: Once | INTRAMUSCULAR | Status: AC
  Administered 2020-01-22: 1 mg via INTRAVENOUS
  Filled 2020-01-22: qty 1

## 2020-01-22 NOTE — Progress Notes (Signed)
CRITICAL VALUE ALERT  Critical Value:  Hgb 6.1   Date & Time Notied:  01/22/20 1125hrs  Provider Notified: Verlon Au   Orders Received/Actions taken:  Orders to transfuse placed

## 2020-01-22 NOTE — Progress Notes (Addendum)
[place] for Heparin Indication: pulmonary embolus  Allergies  Allergen Reactions  . Ivp Dye [Iodinated Diagnostic Agents] Anaphylaxis    Can be pre-treated with Benadryl  . Metrizamide Anaphylaxis  . Other Other (See Comments)    Under no circumstances will the patient agree to a PICC line  . Reglan [Metoclopramide] Anaphylaxis  . Shellfish-Derived Products Anaphylaxis  . Codeine Hives  . Lidocaine Other (See Comments) and Rash    Pt not sure if this is an actual allergy - might have been a one time incident  . Methadone Hives  . Morphine Swelling and Rash    Local reaction to IV being pushed too fast  . Propoxyphene Hives  . Toradol [Ketorolac Tromethamine] Itching    Patient having systemic itching after receiving IV dose  . Iodine Rash    Reports localized reaction at IV site. Able to tolerate with Benadryl.   . Tape Rash  . Vancomycin Rash    Has had vancomycin since this reaction and had no reaction at all    Patient Measurements: Height: 5\' 10"  (177.8 cm) Weight: 83.3 kg (183 lb 10.3 oz) IBW/kg (Calculated) : 73 Heparin Dosing Weight: 79.4 kg  Vital Signs: Temp: 99 F (37.2 C) (11/07 0553) Temp Source: Oral (11/07 0553) BP: 139/72 (11/07 0553) Pulse Rate: 117 (11/07 0553)  Labs: Recent Labs    01/20/20 0325 01/20/20 0325 01/20/20 1324 01/20/20 1324 01/21/20 0705 01/21/20 0706 01/22/20 0258  HGB 6.5*   < > 8.0*   < >  --  8.1* 7.2*  HCT 21.7*   < > 26.1*  --   --  26.4* 23.0*  PLT 303  --   --   --   --  345 366  HEPARINUNFRC 0.45  --   --   --  0.47  --  0.44  CREATININE 0.77  --   --   --   --  0.77 0.90  CKTOTAL  --   --   --   --   --   --  64   < > = values in this interval not displayed.    Estimated Creatinine Clearance: 100.3 mL/min (by C-G formula based on SCr of 0.9 mg/dL).   Medical History: Past Medical History:  Diagnosis Date  . Allergy   . Anxiety   . Arthritis    left shoulder  .  Bronchitis   . Chest pain 07/14/2016  . Complication of anesthesia   . DJD (degenerative joint disease) 11/29/2011  . Hypertension   . Neuromuscular disorder ([place])    carpal tunnel bilateral, ulner nerve surgery  . PONV (postoperative nausea and vomiting)   . Septic arthritis of knee, right ([place]) 11/29/2011  . Spinal headache    "long time ago"   Assessment: 54 yr old male POD#2 I&D L knee and L anterior thigh abscess. Pharmacy consulted 10/30 to switch IV heparin to Lovenox for possible pulmonary embolism due to difficult IV access, need for central line placement and CTA.   10/31: CTA showed bilateral non-occlusive PE. Pharmacy consulted to switch back to heparin for repeat I&D  Today, heparin level is therapeutic at 0.44 on 1550 units/hr.  Hgb improved from 6.5 to 8.1 after transfusion on 11/5, today hgb is 7.2. Plt stable and wnl. Overnight 11/6, patient in significant pain with reported wound vac blood drainage which has now since slowed, concern for bleeding in left thigh and knee. Per Dr. Darrick Grinder note, continue heparin  unless life-threatening bleeding. Plan for Dr. Lorin Mercy to evaluate patient today. Will continue to monitor closely.  Goal of Therapy:   Heparin level 0.3-0.7 units/mL Monitor platelets by anticoagulation protocol: Yes   Plan:  Continue heparin at 1550 units/hr Daily heparin level, CBC Monitor for s/sx bleeding  Fara Olden, PharmD PGY-1 Pharmacy Resident 01/22/2020 7:51 AM Please see AMION for all pharmacy numbers  ADDENDUM: Hgb 6.1 at 1125, transfused. Per Dr. Lorin Mercy note, [facility] not currently draining, knee immobilizer re applied. Continue IV heparin. Will continue to monitor closely.

## 2020-01-22 NOTE — Progress Notes (Addendum)
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-blood culture grew strep angina gnosis wound culture in OR grew strep angina gnosis strep para sanguinous and strep mitis strep salivary us-it has been suspected that he is manipulating and self inoculating infecting his left knee  admitted with chest pain on admission Eventually found to have concerns for infection in the left knee   Assessment & Plan:   Principal Problem:   Bilateral pulmonary embolism ([place]) Active Problems:   Essential hypertension   S/P AKA (above knee amputation) unilateral, right (HCC)   Other chronic pain   Septic arthritis (HCC)   Syncope   Elevated troponin   Chest pain   Bright red blood per rectum   Anemia   Encounter for central line placement   Osteomyelitis of left knee region ([place])   1. Chest pain a. Noncardiac ruled out by cardiology, see below 2. Tachycardia hypotension 11/3 and probably secondary to subacute blood loss a. Could be secondary to submassive PE versus septic response from recent procedure b. Bolused IV fluid overnight 11/3 and improved c. Transfusing blood 11/5 and hemoglobin stabilized in the 8 range-monitor trends 3. Acute anemia without overt bleeding a. Transfusing 1 unit PRBC 11/5 for drop in hemoglobin to 6.5 b. Can continue heparin at this time 4. Abdominal pain query cause a. Patient complained of left lower quadrant pain b. Abdominal x-ray two-view negative c. CT scan abdomen pelvis ordered and is pending but  patient has a contrast allergy so we will wait until he can get premedicated as it appears that this is a severe allergy d. Empiric management at this time pain meds increased yesterday 11/5 given abdominal pain 5. bilateral submassive pulmonary embolisms PSI score 1 a. Found on scan of chest b. Pulmonology consulted given need for surgical intervention and have cleared him although he is relatively high risk given recent PE c. Overall needs 3 to 6 months of anticoagulation 6. syncopal collapse a. Probably secondary to pulmonary embolism 7. septic arthritis in the setting of left AKA 11/2011 with recurrent admissions to [facility] 8. 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 Underwent on 11/3 excisional debridement and bony resection of distal femur and proximal tibia along with stimulant antibiotic beads placed vancomycin gentamicin by Dr. Sharol Given b. Infectious disease saw the patient in consult and follow-up on recommending long-term IV antibiotics which patient will complete probably in the hospital and then short duration of oral antibiotics on discharge c. At the patientS requested on 11/5  increase to Dilaudid 2 mg every 3 hourly and will be changing back in the next 24 hours as per my discussion with him to 1 mg d. Continuing at this time Unasyn and appreciate ID follow-up and input 9. mild hypokalemia a. Replacing with K. Dur 40 mEq daily and resolved b. Resolved magnesium slightly low 10. ?  Bright red blood 11. chronic anemia a. Expected blood loss anemia probably from recent surgery b. Recheck labs transfusion threshold below 7  DVT prophylaxis: Currently on heparin Code Status: Full CODE STATUS Family Communication: No family present currently Disposition: Inpatient  Status is: Inpatient  Remains inpatient appropriate because:Hemodynamically unstable, Persistent severe electrolyte disturbances, Unsafe d/c plan and IV treatments appropriate due to intensity of illness or inability to take  PO   Dispo: The patient is from: Home              Anticipated d/c is to: Home              Anticipated d/c date is: > 3 days              Patient currently is not medically stable to d/c.       Consultants:   Orthopedics  ID  Cardiology  Pulmonology  Procedures: CT chest Echo I&D of the left knee 10/28 Dr. Doreatha Martin  Antimicrobials: Unasyn currently   Subjective: Continues with abdominal pain No fever no chills states pain is localized in left lower quadrant and is passing stool   Objective: Vitals:   01/21/20 2051 01/22/20 0008 01/22/20 0553 01/22/20 0751  BP: (!) 159/104 (!) 173/102 139/72 (!) 143/86  Pulse: (!) 106  (!) 117 (!) 114  Resp: 18 20 20 18   Temp: 98.8 F (37.1 C) 98.8 F (37.1 C) 99 F (37.2 C) 99.2 F (37.3 C)  TempSrc: Oral Oral Oral Oral  SpO2:  98% 98% 99%  Weight:   83.3 kg   Height:        Intake/Output Summary (Last 24 hours) at 01/22/2020 0829 Last data filed at 01/22/2020 0746 Gross per 24 hour  Intake 1911.5 ml  Output 351 ml  Net 1560.5 ml   Filed Weights   01/18/20 1404 01/21/20 0608 01/22/20 0553  Weight: 83.7 kg 80.8 kg 83.3 kg    Examination:  Euthymic pleasant alert coherent does not seem to be in stress was looking at phone when I walked in the room but is claiming severe pain S1-S2 no murmur rub or gallop Abdominal soft no rebound no guarding no mass noted slight distention  Data Reviewed: I have personally reviewed following labs and imaging studies potassium 3.3-->4.0-->3.5-->4.1 BUNs/creatinine 12/0.77-->10/0.8-->5/0.7-(6/0.7 WBC 12.2-->14.1-->11.8-->12.9 , hemoglobin 8.4--> 6.5-->8.1  Radiology Studies: CT ABDOMEN PELVIS W CONTRAST  Result Date: 01/21/2020 CLINICAL DATA:  Abdominal pain.  Left-sided pain. EXAM: CT ABDOMEN AND PELVIS WITH CONTRAST TECHNIQUE: Multidetector CT imaging of the abdomen and pelvis was performed using the standard protocol following bolus administration of intravenous contrast.  CONTRAST:  131mL OMNIPAQUE IOHEXOL 300 MG/ML  SOLN COMPARISON:  Radiographs yesterday. FINDINGS: Lower chest: No focal airspace disease or pleural effusion. Heart is normal in size. Hepatobiliary: No focal liver abnormality is seen. Status post cholecystectomy. No biliary dilatation. Pancreas: No ductal dilatation or inflammation. Spleen: Normal in size without focal abnormality. Adrenals/Urinary Tract: Normal adrenal glands. No hydronephrosis or perinephric edema. Homogeneous renal enhancement with symmetric excretion on delayed phase imaging. Urinary bladder is physiologically distended without wall thickening. Stomach/Bowel: Stomach distended with ingested contents. There is no gastric wall thickening. Normal positioning of the duodenum and ligament of Treitz. No small bowel obstruction or inflammatory change. Appendectomy per history. Moderate volume of stool throughout the entire colon. There is no colonic wall thickening. No pericolonic edema. No significant diverticular change. Small volume of stool in the rectum. Vascular/Lymphatic: Minimal aortic atherosclerosis. No aortic aneurysm. Portal vein is patent. No abdominopelvic adenopathy. Reproductive: Prostate is unremarkable. Other: No free air or free fluid. Postsurgical change of the upper abdominal wall. No ventral abdominal wall hernia. Minimal fat in the inguinal canals. Musculoskeletal: Right hip arthroplasty. Schmorl's nodes involving the lower  thoracic and upper lumbar spine with minimal L2 superior endplate compression deformity, appears chronic. IMPRESSION: 1. No acute abnormality in the abdomen/pelvis. 2. Moderate colonic stool burden, can be seen with constipation. Aortic Atherosclerosis (ICD10-I70.0). Electronically Signed   By: Keith Rake M.D.   On: 01/21/2020 18:37   DG Abd 2 Views  Result Date: 01/20/2020 CLINICAL DATA:  Abdominal pain. EXAM: ABDOMEN - 2 VIEW COMPARISON:  None. FINDINGS: The bowel gas pattern is normal. There is no  evidence of free air. No radio-opaque calculi or other significant radiographic abnormality is seen. IMPRESSION: Negative. Electronically Signed   By: Marijo Conception M.D.   On: 01/20/2020 12:51     Scheduled Meds: . sodium chloride   Intravenous Once  . acetaminophen  1,000 mg Oral Q6H  . Chlorhexidine Gluconate Cloth  6 each Topical Daily  . docusate sodium  100 mg Oral BID  . gabapentin  300 mg Oral TID  . methocarbamol  1,000 mg Oral Q8H  . sodium chloride flush  10-40 mL Intracatheter Q12H   Continuous Infusions: . ampicillin-sulbactam (UNASYN) IV 3 g (01/22/20 0641)  . heparin 1,550 Units/hr (01/22/20 0746)  . methocarbamol (ROBAXIN) IV       LOS: 11 days    Time spent: [place], MD [facility] To contact the attending provider between 7A-7P or the covering provider during after hours 7P-7A, please log into the web site [email or web address] and access using universal [place] password for that web site. If you do not have the password, please call the hospital operator.  01/22/2020, 8:29 AM

## 2020-01-22 NOTE — Progress Notes (Signed)
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-blood culture grew strep angina gnosis wound culture in OR grew strep angina gnosis strep para sanguinous and strep mitis strep salivary us-it has been suspected that he is manipulating and self inoculating infecting his left knee  admitted with chest pain on admission Eventually found to have concerns for infection in the left knee and was seen by orthopedic surgery who has done 2 procedures on the leg to date with antibiotic cement bleeding on 10/20 and then excisional debridement with bony resection of distal fibula and proximal tibia resected for osteomyelitis with wound VAC  Patient had nonspecific abdominal pain during hospital stay over 11 5 through 11/6 without source He then developed severe knee pain coming back from CT abdomen pelvis with bloody drainage and severe deformity   Assessment & Plan:   Principal Problem:   Bilateral pulmonary embolism ([place]) Active Problems:   Essential hypertension   S/P AKA (above knee amputation) unilateral, right (HCC)   Other chronic pain   Septic arthritis (HCC)   Syncope   Elevated troponin   Chest pain   Bright red blood per rectum   Anemia   Encounter for central line placement   Osteomyelitis of left knee region ([place])   1. Bleeding into the left thigh and knee a. Unclear how this occurred patient was a CT when it happened-may have malpositioned it? b. Was hypotensive and tachycardic and bolused fluids 1000 cc overnight 11/6 c. Substantial  leukocytosis of 20 may be stress response however will defer to orthopedic surgery who will see d. Because of anemia we will repeat hemoglobin and transfuse for hemoglobin less than 7-nursing aware to let me know 2. Septic arthritis in the setting of left AKA 11/2011 with recurrent admissions to [facility] 3. 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 Underwent antibiotic bead placement 10/28 b. Underwent on 11/3 excisional debridement and bony resection of distal femur and proximal tibia c. Infectious disease recommending long-term IV antibiotics which patient will complete probably in the hospital and then short duration of oral antibiotics on discharge d. Due to severe pain and malpositioning of area have increased pain regimen to Dilaudid 2 mg every 2 as needed and oxycodone 15 mg every 3 as needed e. Defer to orthopedic surgery for further planning and if revision surgery or examination is needed f. Will need to have a discussion regarding de-escalating the same in the outpatient setting 4. Continuing at this time Unasyn and appreciate ID follow-up and inputAcute anemia secondary to malposition from left knee thigh a. Transfusing 1 unit PRBC 11/5 for drop in hemoglobin to 6.5 b. See above we will transfuse if drops below 7 c. Can continue heparin at this time 5. bilateral submassive pulmonary embolisms PSI score 1 diagnosed on 01/15/2020 a. Pulmonology saw and cleared the patient for surgical intervention b. Overall needs 3 to 6 months of anticoagulation c. Chest pain d. Noncardiac ruled out by cardiology, see below 6. Abdominal pain query cause a. Patient complained of left lower quadrant pain b. Abdominal x-ray two-view negative c. CT scan abdomen pelvis ordered and is  pending but patient has a contrast allergy so we will wait until he can get premedicated as it appears that this is a severe allergy d. No work-up needs good bowel regimen 7. syncopal collapse a. Probably secondary to pulmonary  embolism b.  8. mild hypokalemia a. Resume replacement with kdur 40  DVT prophylaxis: Currently on heparin Code Status: Full CODE STATUS Family Communication: No family present currently Disposition: Inpatient  Status is: Inpatient  Remains inpatient appropriate because:Hemodynamically unstable, Persistent severe electrolyte disturbances, Unsafe d/c plan and IV treatments appropriate due to intensity of illness or inability to take PO   Dispo: The patient is from: Home              Anticipated d/c is to: Home              Anticipated d/c date is: > 3 days              Patient currently is not medically stable to d/c.       Consultants:   Orthopedics  ID  Cardiology  Pulmonology  Procedures: CT chest Echo I&D of the left knee 10/28 Dr. Doreatha Martin  Antimicrobials: Unasyn currently   Subjective: Had severe pain after movement at CT See events from last night per Dr. Darrick Grinder note He is stating he has been severe pain currently needing IV pain medication His heart rates in the 130s but he has no chest pain   Objective: Vitals:   01/21/20 2051 01/22/20 0008 01/22/20 0553 01/22/20 0751  BP: (!) 159/104 (!) 173/102 139/72 (!) 143/86  Pulse: (!) 106  (!) 117 (!) 114  Resp: 18 20 20 18   Temp: 98.8 F (37.1 C) 98.8 F (37.1 C) 99 F (37.2 C) 99.2 F (37.3 C)  TempSrc: Oral Oral Oral Oral  SpO2:  98% 98% 99%  Weight:   83.3 kg   Height:        Intake/Output Summary (Last 24 hours) at 01/22/2020 0834 Last data filed at 01/22/2020 0746 Gross per 24 hour  Intake 1911.5 ml  Output 351 ml  Net 1560.5 ml   Filed Weights   01/18/20 1404 01/21/20 0608 01/22/20 0553  Weight: 83.7 kg 80.8 kg 83.3 kg    Examination:  Coherent seems to be in some distress Chest clear Tachycardic Abdomen soft no rebound no guarding Some increased drainage and canister.today   Data Reviewed: I have personally reviewed following labs and imaging studies potassium  3.3-->4.0-->3.5-->4.1-->3.2 BUNs/creatinine 12/0.77-->10/0.8-->5/0.7--->12/0.9 WBC 12.2-->14.1-->11.8-->12.9->20.9 , hemoglobin 8.4--> 6.5-->8.1-->7.2  Radiology Studies: CT ABDOMEN PELVIS W CONTRAST  Result Date: 01/21/2020 CLINICAL DATA:  Abdominal pain.  Left-sided pain. EXAM: CT ABDOMEN AND PELVIS WITH CONTRAST TECHNIQUE: Multidetector CT imaging of the abdomen and pelvis was performed using the standard protocol following bolus administration of intravenous contrast. CONTRAST:  112mL OMNIPAQUE IOHEXOL 300 MG/ML  SOLN COMPARISON:  Radiographs yesterday. FINDINGS: Lower chest: No focal airspace disease or pleural effusion. Heart is normal in size. Hepatobiliary: No focal liver abnormality is seen. Status post cholecystectomy. No biliary dilatation. Pancreas: No ductal dilatation or inflammation. Spleen: Normal in size without focal abnormality. Adrenals/Urinary Tract: Normal adrenal glands. No hydronephrosis or perinephric edema. Homogeneous renal enhancement with symmetric excretion on delayed phase imaging. Urinary bladder is physiologically distended without wall thickening. Stomach/Bowel: Stomach distended with ingested contents. There is no gastric wall thickening. Normal positioning of the duodenum and ligament of Treitz. No small bowel obstruction or inflammatory change. Appendectomy per history. Moderate volume of stool throughout the entire colon.  There is no colonic wall thickening. No pericolonic edema. No significant diverticular change. Small volume of stool in the rectum. Vascular/Lymphatic: Minimal aortic atherosclerosis. No aortic aneurysm. Portal vein is patent. No abdominopelvic adenopathy. Reproductive: Prostate is unremarkable. Other: No free air or free fluid. Postsurgical change of the upper abdominal wall. No ventral abdominal wall hernia. Minimal fat in the inguinal canals. Musculoskeletal: Right hip arthroplasty. Schmorl's nodes involving the lower thoracic and upper lumbar spine  with minimal L2 superior endplate compression deformity, appears chronic. IMPRESSION: 1. No acute abnormality in the abdomen/pelvis. 2. Moderate colonic stool burden, can be seen with constipation. Aortic Atherosclerosis (ICD10-I70.0). Electronically Signed   By: Keith Rake M.D.   On: 01/21/2020 18:37   DG Abd 2 Views  Result Date: 01/20/2020 CLINICAL DATA:  Abdominal pain. EXAM: ABDOMEN - 2 VIEW COMPARISON:  None. FINDINGS: The bowel gas pattern is normal. There is no evidence of free air. No radio-opaque calculi or other significant radiographic abnormality is seen. IMPRESSION: Negative. Electronically Signed   By: Marijo Conception M.D.   On: 01/20/2020 12:51     Scheduled Meds: . sodium chloride   Intravenous Once  . acetaminophen  1,000 mg Oral Q6H  . Chlorhexidine Gluconate Cloth  6 each Topical Daily  . docusate sodium  100 mg Oral BID  . gabapentin  300 mg Oral TID  . methocarbamol  1,000 mg Oral Q8H  . sodium chloride flush  10-40 mL Intracatheter Q12H   Continuous Infusions: . ampicillin-sulbactam (UNASYN) IV 3 g (01/22/20 0641)  . heparin 1,550 Units/hr (01/22/20 0746)  . methocarbamol (ROBAXIN) IV       LOS: 11 days    Time spent: [place], MD [facility] To contact the attending provider between 7A-7P or the covering provider during after hours 7P-7A, please log into the web site [email or web address] and access using universal [place] password for that web site. If you do not have the password, please call the hospital operator.  01/22/2020, 8:34 AM

## 2020-01-22 NOTE — Progress Notes (Signed)
CRITICAL VALUE ALERT  Critical Value:  Lactic Acid   2.1  Date & Time Notified: 11/7  4:25  Provider Notified: Inda Merlin, MD  Orders Received/Actions taken: LR 1000 mL bolus @ 500 mL/h Recheck Lactic Acid @ 10 AM

## 2020-01-22 NOTE — Progress Notes (Signed)
3 units transfused in last week . Patient did not have his knee immobilizer on . Total outpt last 48 hrs via VAC lists 50,50,100 total of 234ml since 01/20/20.  If VAC starts increasing outpt can turn VAC off . It is an incisional VAC.  Getting large dose narcotics PO and IV and alert conversant. Currently VAC not draining and knee immobilizer re applied. If VAC starts draining then turn it off. I told pt he needs to keep immobilizer on to prevent significant bleeding due to his IV heparin drip.

## 2020-01-22 NOTE — Progress Notes (Signed)
Left leg measured above knee 49.5cm. c/o 10/10 pain right upper thigh, area firm to palpation.  Left foot warm, +2 pedal pulse.  Wound vac in place with serosanguinous d/c in cannister, 150cc in cannister at this time, no change since start of shift.

## 2020-01-23 DIAGNOSIS — I2699 Other pulmonary embolism without acute cor pulmonale: Secondary | ICD-10-CM | POA: Diagnosis not present

## 2020-01-23 LAB — BPAM RBC
Blood Product Expiration Date: 202111282359
Blood Product Expiration Date: 202111292359
Blood Product Expiration Date: 202111292359
ISSUE DATE / TIME: 202111050749
ISSUE DATE / TIME: 202111071242
ISSUE DATE / TIME: 202111071540
Unit Type and Rh: 9500
Unit Type and Rh: 9500
Unit Type and Rh: 9500

## 2020-01-23 LAB — CBC
HCT: 23.1 % — ABNORMAL LOW (ref 39.0–52.0)
Hemoglobin: 7.4 g/dL — ABNORMAL LOW (ref 13.0–17.0)
MCH: 26.7 pg (ref 26.0–34.0)
MCHC: 32 g/dL (ref 30.0–36.0)
MCV: 83.4 fL (ref 80.0–100.0)
Platelets: 252 10*3/uL (ref 150–400)
RBC: 2.77 MIL/uL — ABNORMAL LOW (ref 4.22–5.81)
RDW: 17.5 % — ABNORMAL HIGH (ref 11.5–15.5)
WBC: 13.9 10*3/uL — ABNORMAL HIGH (ref 4.0–10.5)
nRBC: 0 % (ref 0.0–0.2)

## 2020-01-23 LAB — TYPE AND SCREEN
ABO/RH(D): O NEG
Antibody Screen: NEGATIVE
Unit division: 0
Unit division: 0
Unit division: 0

## 2020-01-23 LAB — AEROBIC/ANAEROBIC CULTURE W GRAM STAIN (SURGICAL/DEEP WOUND): Culture: NO GROWTH

## 2020-01-23 LAB — HEPARIN LEVEL (UNFRACTIONATED)
Heparin Unfractionated: 0.21 IU/mL — ABNORMAL LOW (ref 0.30–0.70)
Heparin Unfractionated: 0.24 IU/mL — ABNORMAL LOW (ref 0.30–0.70)

## 2020-01-23 MED ORDER — METOPROLOL TARTRATE 12.5 MG HALF TABLET
12.5000 mg | ORAL_TABLET | Freq: Two times a day (BID) | ORAL | Status: DC
  Administered 2020-01-23 – 2020-02-01 (×19): 12.5 mg via ORAL
  Filled 2020-01-23 (×20): qty 1

## 2020-01-23 NOTE — Progress Notes (Signed)
Patient is status post irrigation and debridement of septic arthritis of his knee.  He is wearing his immobilizer this morning.  Wound VAC is functioning with 1 green check.  There is bloody serous drainage that is light-colored in the canister based on the markings on the canister he has had little to no increase in drainage since it was last checked  Cultures have been negative probably due to prolonged antibiotics.  We will go with ID recommendations regards to long-term antibiotics will need 1 week follow-up in our office

## 2020-01-23 NOTE — Progress Notes (Signed)
PT Cancellation Note  Patient Details Name: Jason [name] MRN: [id number] DOB: 07-17-1965   Cancelled Treatment:    Reason Eval/Treat Not Completed: Pain limiting ability to participate. Pt reports he is in too much pain to participate. Pt had been premedicated. Will continue attempts.    Shary Decamp [facility] 01/23/2020, 3:30 PM [place] Pager [phone] Office [phone]

## 2020-01-23 NOTE — Progress Notes (Addendum)
I have seen and examined the patient. I have personally reviewed the clinical findings, laboratory findings, microbiological data and imaging studies. The assessment and treatment plan was discussed with the  Advance Practice Provider, [facility] [place]/Gregory Calone. I agree with her/his documentation except following additions/corrections.  Ideally, it would be best to have him get a prolonged IV abx ( Unasyn) for at least 6 weeks for septic arthrtis/Osteomyelitis of his left knee in an inpatient or a supervised setting ( SNF) given his h/o self induced injuries. We discussed about it today and he said he will think about it. Monitor CBC and CMP at least twice weekly while on IV abx. Baseline CRP and ESR. Will follow intermittently.  Jason Oz, MD [place] for Infectious Disease [place] for Infectious Disease  Date of Admission:  01/11/2020      Total days of antibiotics 11  Unasyn           ASSESSMENT: Jason [name] is a 54 y.o. male with septic arthritis of the left knee due to prevotella denticola now s/p serial debridements.   Discussed with him that we would recommend ongoing IV therapy and best for him to get this at SNF. He has not made up his mind yet but will continue to think about recommendations. Bleeding from the knee has stopped.   Will get AM CRP / ESR for therapeutic monitoring of infection.    PLAN: 1. Continue unasyn  2. Recommend the SNF for ongoing treatment with IV 3. ESR / CRP in AM    Principal Problem:   Bilateral pulmonary embolism (HCC) Active Problems:   Essential hypertension   S/P AKA (above knee amputation) unilateral, right (HCC)   Other chronic pain   Septic arthritis (HCC)   Syncope   Elevated troponin   Chest pain   Bright red blood per rectum   Anemia   Encounter for central line placement   Osteomyelitis of left knee region ([place])   . acetaminophen  1,000 mg Oral Q6H    . acetaminophen  650 mg Oral Once  . Chlorhexidine Gluconate Cloth  6 each Topical Daily  . docusate sodium  100 mg Oral BID  . gabapentin  300 mg Oral TID  . methocarbamol  1,000 mg Oral Q8H  . metoprolol tartrate  12.5 mg Oral BID  . potassium chloride  40 mEq Oral Daily  . sodium chloride flush  10-40 mL Intracatheter Q12H    SUBJECTIVE: Slept poorly last night and not feeling great today.  He states he cannot use a PICC line but gives no reason why or details. Knee has stopped bleeding. Doing better wearing immobilizer.    Review of Systems: Review of Systems  Constitutional: Negative for chills and fever.  HENT: Negative for tinnitus.   Eyes: Negative for blurred vision and photophobia.  Respiratory: Negative for cough and sputum production.   Cardiovascular: Negative for chest pain.  Gastrointestinal: Negative for diarrhea, nausea and vomiting.  Genitourinary: Negative for dysuria.  Musculoskeletal:       Knee immobilizer in place. Dressings appear clean and dry.   Skin: Negative for rash.  Neurological: Negative for headaches.    Allergies  Allergen Reactions  . Ivp Dye [Iodinated Diagnostic Agents] Anaphylaxis    Can be pre-treated with Benadryl  . Metrizamide Anaphylaxis  . Other Other (See Comments)    Under no circumstances will the patient agree to a  PICC line  . Reglan [Metoclopramide] Anaphylaxis  . Shellfish-Derived Products Anaphylaxis  . Codeine Hives  . Lidocaine Other (See Comments) and Rash    Pt not sure if this is an actual allergy - might have been a one time incident  . Methadone Hives  . Morphine Swelling and Rash    Local reaction to IV being pushed too fast  . Propoxyphene Hives  . Toradol [Ketorolac Tromethamine] Itching    Patient having systemic itching after receiving IV dose  . Iodine Rash    Reports localized reaction at IV site. Able to tolerate with Benadryl.   . Tape Rash  . Vancomycin Rash    Has had vancomycin since this  reaction and had no reaction at all    OBJECTIVE: Vitals:   01/23/20 0012 01/23/20 0458 01/23/20 0809 01/23/20 1139  BP: 94/65 (!) 154/81 (!) 168/83 134/71  Pulse: 100 (!) 110 (!) 121   Resp: 20 18 18 20   Temp: 99.1 F (37.3 C) 97.9 F (36.6 C) 99.8 F (37.7 C) 99.9 F (37.7 C)  TempSrc: Oral Oral Oral Oral  SpO2: 98% 100% 100%   Weight:  84 kg    Height:       Body mass index is 26.57 kg/m.  Physical Exam  Lab Results Lab Results  Component Value Date   WBC 13.9 (H) 01/23/2020   HGB 7.4 (L) 01/23/2020   HCT 23.1 (L) 01/23/2020   MCV 83.4 01/23/2020   PLT 252 01/23/2020    Lab Results  Component Value Date   CREATININE 0.90 01/22/2020   BUN 12 01/22/2020   NA 136 01/22/2020   K 3.2 (L) 01/22/2020   CL 99 01/22/2020   CO2 26 01/22/2020    Lab Results  Component Value Date   ALT 9 01/22/2020   AST 14 (L) 01/22/2020   ALKPHOS 84 01/22/2020   BILITOT 0.3 01/22/2020     Microbiology: WCx 10/28 Prevotella denticola    Janene Madeira, MSN, NP-C [place] for Infectious Disease [email or web address] Pager: [phone] Office: [phone] [place]: [phone]

## 2020-01-23 NOTE — Progress Notes (Addendum)
[place] for Heparin Indication: pulmonary embolus  Allergies  Allergen Reactions  . Ivp Dye [Iodinated Diagnostic Agents] Anaphylaxis    Can be pre-treated with Benadryl  . Metrizamide Anaphylaxis  . Other Other (See Comments)    Under no circumstances will the patient agree to a PICC line  . Reglan [Metoclopramide] Anaphylaxis  . Shellfish-Derived Products Anaphylaxis  . Codeine Hives  . Lidocaine Other (See Comments) and Rash    Pt not sure if this is an actual allergy - might have been a one time incident  . Methadone Hives  . Morphine Swelling and Rash    Local reaction to IV being pushed too fast  . Propoxyphene Hives  . Toradol [Ketorolac Tromethamine] Itching    Patient having systemic itching after receiving IV dose  . Iodine Rash    Reports localized reaction at IV site. Able to tolerate with Benadryl.   . Tape Rash  . Vancomycin Rash    Has had vancomycin since this reaction and had no reaction at all    Patient Measurements: Height: 5\' 10"  (177.8 cm) Weight: 84 kg (185 lb 3 oz) IBW/kg (Calculated) : 73 Heparin Dosing Weight: 79.4 kg  Vital Signs: Temp: 97.9 F (36.6 C) (11/08 0458) Temp Source: Oral (11/08 0458) BP: 154/81 (11/08 0458) Pulse Rate: 110 (11/08 0458)  Labs: Recent Labs    01/20/20 1324 01/21/20 0705 01/21/20 0706 01/21/20 0706 01/22/20 0258 01/22/20 0258 01/22/20 1045 01/22/20 1045 01/22/20 1858 01/23/20 0544  HGB   < >  --  8.1*   < > 7.2*   < > 6.1*   < > 8.7* 7.4*  HCT   < >  --  26.4*   < > 23.0*   < > 20.0*  --  27.3* 23.1*  PLT  --   --  345   < > 366   < > 317  --  275 252  HEPARINUNFRC  --  0.47  --   --  0.44  --   --   --   --  0.21*  CREATININE  --   --  0.77  --  0.90  --   --   --   --   --   CKTOTAL  --   --   --   --  64  --   --   --   --   --    < > = values in this interval not displayed.    Estimated Creatinine Clearance: 100.3 mL/min (by C-G formula based on SCr of 0.9  mg/dL).   Medical History: Past Medical History:  Diagnosis Date  . Allergy   . Anxiety   . Arthritis    left shoulder  . Bronchitis   . Chest pain 07/14/2016  . Complication of anesthesia   . DJD (degenerative joint disease) 11/29/2011  . Hypertension   . Neuromuscular disorder ([place])    carpal tunnel bilateral, ulner nerve surgery  . PONV (postoperative nausea and vomiting)   . Septic arthritis of knee, right ([place]) 11/29/2011  . Spinal headache    "long time ago"   Assessment: 54 yr old male POD#2 I&D L knee and L anterior thigh abscess. Pharmacy consulted 10/30 to switch IV heparin to Lovenox for possible pulmonary embolism due to difficult IV access, need for central line placement and CTA.   10/31: CTA showed bilateral non-occlusive PE. Pharmacy consulted to switch back to heparin for repeat I&D  Heparin level this morning is subtherapeutic at 0.21, on 1550 units/hr. Hgb trend 6.1 up to 8.7 after transfusions, now down to 7.4 today. Bleeding stable overnight. Will continue to monitor closely.  Goal of Therapy:   Heparin level 0.3-0.7 units/mL Monitor platelets by anticoagulation protocol: Yes   Plan:  Continue heparin at 1650 units/hr Order heparin level in 6 hours  Daily heparin level, CBC Monitor for s/sx bleeding  Antonietta Jewel, PharmD, [place] Pharmacist  Phone: [phone] 01/23/2020 7:34 AM  Please check AMION for all [place] phone numbers After 10:00 PM, call [place] [phone]  ADDENDUM Heparin level this afternoon with rate increase still subtherapeutic at 0.24, on 1650 units/hr. Bleeding remains stable from earlier - no interruptions or infusion issues. Will increase to 1750 units/hr and get level in 6 hr.   Antonietta Jewel, PharmD, [place] Clinical Pharmacist

## 2020-01-23 NOTE — Progress Notes (Signed)
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-blood culture grew strep angina gnosis wound culture in OR grew strep angina gnosis strep para sanguinous and strep mitis strep salivary us-it has been suspected that he is manipulating and self inoculating infecting his left knee  admitted with chest pain on admission Eventually found to have concerns for infection in the left knee and was seen by orthopedic surgery who has done 2 procedures on the leg to date with antibiotic cement bleeding on 10/20 and then excisional debridement with bony resection of distal fibula and proximal tibia resected for osteomyelitis with wound VAC  Patient had nonspecific abdominal pain during hospital stay over 11 5 through 11/6 without source-CT scans and x-rays were negative He then developed severe knee pain coming back from CT abdomen pelvis with bloody drainage in the setting of not wearing his knee immobilizer which is since resolved and does not require operative management as per orthopedics   Assessment & Plan:   Principal Problem:   Bilateral pulmonary embolism ([place]) Active Problems:   Essential hypertension   S/P AKA (above knee amputation) unilateral, right (HCC)   Other chronic pain   Septic arthritis (HCC)   Syncope   Elevated troponin   Chest pain   Bright red blood per rectum   Anemia   Encounter for central line placement   Osteomyelitis of left knee region ([place])   1. Bleeding into the left thigh and knee a. Unclear how this occurred patient  was a CT when it happened-may have malpositioned it?-Is wearing a knee brace now b. Initially hypotensive tachycardic secondary to pain which is now better controlled 2. Septic arthritis in the setting of left AKA 11/2011 with recurrent admissions to [facility] 3. 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 Underwent antibiotic bead placement 10/28 b. Underwent on 11/3 excisional debridement and bony resection of distal femur and proximal tibia c. Infectious disease recommending long-term IV antibiotics-deep bone cultures from 11/3 - to date d. Due to severe pain and malpositioning of area have increased pain regimen to Dilaudid 3 mg every 2 as needed and oxycodone 15 mg every 3 as needed--- I have clearly explained to him that we will be cutting back his IV Dilaudid over the next several days 4. Continuing at this time Unasyn and appreciate ID follow-up and inputAcute anemia secondary to malposition from left knee thigh a. Transfusing 1 unit PRBC 11/5 for drop in hemoglobin to 6.5 b. Hemoglobin dropped again to 6 therefore given 2 units PRBC 11/7 c.  continue heparin at this time d. Likely transition off of heparin to [place] in the next 24 to 48 hours if no further surgical intervention is needed 5. bilateral submassive pulmonary embolisms PSI score 1 diagnosed on 01/15/2020 a. Pulmonology saw and cleared the patient for surgical intervention b. Overall needs 3 to 6 months of anticoagulation 6. Sinus tachycardia a. Probably secondary to pain/anxiety b. Start metoprolol 12.5 twice daily c. Monitor trends 7. Earlier during stay in hospital pain had chest pain a. Probably secondary to PE no further work-up cardiology signed off 8. Abdominal pain query cause  a. Patient complained of left lower quadrant pain b. Abdominal x-ray two-view negative c. CT scan abdomen pelvis negative for any specific pathology d. No work-up needs good bowel regimen continue laxatives 9. syncopal collapse a. Probably secondary to  pulmonary embolism 10. mild hypokalemia a. Resume replacement with kdur 40 b. Repeat labs in a.m.  DVT prophylaxis: Currently on heparin Code Status: Full CODE STATUS Family Communication: No family present currently Disposition: Inpatient  Status is: Inpatient  Remains inpatient appropriate because:Hemodynamically unstable, Persistent severe electrolyte disturbances, Unsafe d/c plan and IV treatments appropriate due to intensity of illness or inability to take PO   Dispo: The patient is from: Home              Anticipated d/c is to: Home              Anticipated d/c date is: > 3 days              Patient currently is not medically stable to d/c.       Consultants:   Orthopedics  ID  Cardiology  Pulmonology  Procedures: CT chest Echo I&D of the left knee 10/28 Dr. Doreatha Martin  Antimicrobials: Unasyn currently   Subjective:  Subjectively is symptoms are [place] objective findings-he is sitting up in bed looks rather comfortable He is receiving his IV pain medications he has no fever no chills Orthopedics note has been reviewed he is to keep his knee immobilizer on   Objective: Vitals:   01/22/20 2024 01/23/20 0012 01/23/20 0458 01/23/20 0809  BP:  94/65 (!) 154/81 (!) 168/83  Pulse: (!) 115 100 (!) 110 (!) 121  Resp:  20 18 18   Temp:  99.1 F (37.3 C) 97.9 F (36.6 C) 99.8 F (37.7 C)  TempSrc:  Oral Oral Oral  SpO2:  98% 100% 100%  Weight:   84 kg   Height:        Intake/Output Summary (Last 24 hours) at 01/23/2020 0832 Last data filed at 01/23/2020 0458 Gross per 24 hour  Intake 4068.71 ml  Output 3790 ml  Net 278.71 ml   Filed Weights   01/21/20 0608 01/22/20 0553 01/23/20 0458  Weight: 80.8 kg 83.3 kg 84 kg    Examination:  Coherent NCAT no icterus no pallor Chest clear Tachycardic sinus tach Abdomen soft no rebound no guarding   Data Reviewed: I have personally reviewed following labs and imaging studies  WBC  12.2-->14.1-->11.8-->12.9->20.9-->13.9 , hemoglobin 8.4--> 6.5-->8.1-->7.2-->6.1-->2 units PRBC-->7.4  Radiology Studies: CT ABDOMEN PELVIS W CONTRAST  Result Date: 01/21/2020 CLINICAL DATA:  Abdominal pain.  Left-sided pain. EXAM: CT ABDOMEN AND PELVIS WITH CONTRAST TECHNIQUE: Multidetector CT imaging of the abdomen and pelvis was performed using the standard protocol following bolus administration of intravenous contrast. CONTRAST:  163mL OMNIPAQUE IOHEXOL 300 MG/ML  SOLN COMPARISON:  Radiographs yesterday. FINDINGS: Lower chest: No focal airspace disease or pleural effusion. Heart is normal in size. Hepatobiliary: No focal liver abnormality is seen. Status post cholecystectomy. No biliary dilatation. Pancreas: No ductal dilatation or inflammation. Spleen: Normal in size without focal abnormality. Adrenals/Urinary Tract: Normal adrenal glands. No hydronephrosis or perinephric edema. Homogeneous renal enhancement with symmetric excretion on delayed phase imaging. Urinary bladder is physiologically distended without wall thickening. Stomach/Bowel: Stomach distended with ingested contents. There is no gastric wall thickening. Normal positioning of the duodenum and ligament of [place]itz. No small bowel obstruction or inflammatory change. Appendectomy per history. Moderate volume of stool throughout the entire colon. There is no colonic wall  thickening. No pericolonic edema. No significant diverticular change. Small volume of stool in the rectum. Vascular/Lymphatic: Minimal aortic atherosclerosis. No aortic aneurysm. Portal vein is patent. No abdominopelvic adenopathy. Reproductive: Prostate is unremarkable. Other: No free air or free fluid. Postsurgical change of the upper abdominal wall. No ventral abdominal wall hernia. Minimal fat in the inguinal canals. Musculoskeletal: Right hip arthroplasty. Schmorl's nodes involving the lower thoracic and upper lumbar spine with minimal L2 superior endplate compression  deformity, appears chronic. IMPRESSION: 1. No acute abnormality in the abdomen/pelvis. 2. Moderate colonic stool burden, can be seen with constipation. Aortic Atherosclerosis (ICD10-I70.0). Electronically Signed   By: Keith Rake M.D.   On: 01/21/2020 18:37     Scheduled Meds: . acetaminophen  1,000 mg Oral Q6H  . acetaminophen  650 mg Oral Once  . Chlorhexidine Gluconate Cloth  6 each Topical Daily  . docusate sodium  100 mg Oral BID  . gabapentin  300 mg Oral TID  . methocarbamol  1,000 mg Oral Q8H  . metoprolol tartrate  12.5 mg Oral BID  . potassium chloride  40 mEq Oral Daily  . sodium chloride flush  10-40 mL Intracatheter Q12H   Continuous Infusions: . sodium chloride 100 mL/hr at 01/23/20 0729  . ampicillin-sulbactam (UNASYN) IV 3 g (01/23/20 0730)  . heparin 1,650 Units/hr (01/23/20 6333)  . methocarbamol (ROBAXIN) IV       LOS: 12 days    Time spent: [place], MD [facility] To contact the attending provider between 7A-7P or the covering provider during after hours 7P-7A, please log into the web site [email or web address] and access using universal [place] password for that web site. If you do not have the password, please call the hospital operator.  01/23/2020, 8:32 AM

## 2020-01-24 DIAGNOSIS — I2699 Other pulmonary embolism without acute cor pulmonale: Secondary | ICD-10-CM | POA: Diagnosis not present

## 2020-01-24 LAB — CBC
HCT: 22 % — ABNORMAL LOW (ref 39.0–52.0)
Hemoglobin: 7 g/dL — ABNORMAL LOW (ref 13.0–17.0)
MCH: 27.3 pg (ref 26.0–34.0)
MCHC: 31.8 g/dL (ref 30.0–36.0)
MCV: 85.9 fL (ref 80.0–100.0)
Platelets: 268 10*3/uL (ref 150–400)
RBC: 2.56 MIL/uL — ABNORMAL LOW (ref 4.22–5.81)
RDW: 17.7 % — ABNORMAL HIGH (ref 11.5–15.5)
WBC: 17.7 10*3/uL — ABNORMAL HIGH (ref 4.0–10.5)
nRBC: 0 % (ref 0.0–0.2)

## 2020-01-24 LAB — C-REACTIVE PROTEIN: CRP: 40 mg/dL — ABNORMAL HIGH (ref <1.0)

## 2020-01-24 LAB — PREPARE RBC (CROSSMATCH)

## 2020-01-24 LAB — HEPARIN LEVEL (UNFRACTIONATED)
Heparin Unfractionated: 0.22 IU/mL — ABNORMAL LOW (ref 0.30–0.70)
Heparin Unfractionated: 0.29 IU/mL — ABNORMAL LOW (ref 0.30–0.70)
Heparin Unfractionated: 0.33 IU/mL (ref 0.30–0.70)

## 2020-01-24 LAB — SEDIMENTATION RATE: Sed Rate: 80 mm/hr — ABNORMAL HIGH (ref 0–16)

## 2020-01-24 MED ORDER — FUROSEMIDE 10 MG/ML IJ SOLN
20.0000 mg | Freq: Once | INTRAMUSCULAR | Status: AC
  Administered 2020-01-24: 20 mg via INTRAVENOUS
  Filled 2020-01-24 (×2): qty 2

## 2020-01-24 MED ORDER — DIPHENHYDRAMINE HCL 25 MG PO CAPS
25.0000 mg | ORAL_CAPSULE | Freq: Once | ORAL | Status: AC
  Administered 2020-01-24: 25 mg via ORAL
  Filled 2020-01-24 (×2): qty 1

## 2020-01-24 MED ORDER — SODIUM CHLORIDE 0.9% IV SOLUTION: Freq: Once | INTRAVENOUS | Status: DC

## 2020-01-24 MED ORDER — SODIUM CHLORIDE 0.9% IV SOLUTION: Freq: Once | INTRAVENOUS | Status: AC

## 2020-01-24 MED ORDER — HYDROMORPHONE HCL 1 MG/ML IJ SOLN
3.0000 mg | INTRAMUSCULAR | Status: DC | PRN
  Administered 2020-01-24 – 2020-01-25 (×5): 3 mg via INTRAVENOUS
  Filled 2020-01-24 (×5): qty 3

## 2020-01-24 MED ORDER — HYDROMORPHONE HCL 1 MG/ML IJ SOLN: 3.0000 mg | INTRAMUSCULAR | Status: DC | PRN

## 2020-01-24 MED ORDER — OXYCODONE HCL 5 MG PO TABS
20.0000 mg | ORAL_TABLET | ORAL | Status: DC | PRN
  Administered 2020-01-24 – 2020-02-01 (×30): 20 mg via ORAL
  Filled 2020-01-24 (×31): qty 4

## 2020-01-24 MED ORDER — ACETAMINOPHEN 325 MG PO TABS
650.0000 mg | ORAL_TABLET | Freq: Once | ORAL | Status: AC
  Administered 2020-01-24: 650 mg via ORAL
  Filled 2020-01-24 (×2): qty 2

## 2020-01-24 NOTE — Progress Notes (Addendum)
[facility] for Heparin Indication: pulmonary embolus  Allergies  Allergen Reactions  . Ivp Dye [Iodinated Diagnostic Agents] Anaphylaxis    Can be pre-treated with Benadryl  . Metrizamide Anaphylaxis  . Other Other (See Comments)    Under no circumstances will the patient agree to a PICC line  . Reglan [Metoclopramide] Anaphylaxis  . Shellfish-Derived Products Anaphylaxis  . Codeine Hives  . Lidocaine Other (See Comments) and Rash    Pt not sure if this is an actual allergy - might have been a one time incident  . Methadone Hives  . Morphine Swelling and Rash    Local reaction to IV being pushed too fast  . Propoxyphene Hives  . Toradol [Ketorolac Tromethamine] Itching    Patient having systemic itching after receiving IV dose  . Iodine Rash    Reports localized reaction at IV site. Able to tolerate with Benadryl.   . Tape Rash  . Vancomycin Rash    Has had vancomycin since this reaction and had no reaction at all    Patient Measurements: Height: 5\' 10"  (177.8 cm) Weight: 83.3 kg (183 lb 10.3 oz) IBW/kg (Calculated) : 73 Heparin Dosing Weight: 79.4 kg  Vital Signs: Temp: 98.9 F (37.2 C) (11/09 1032) Temp Source: Oral (11/09 1032) BP: 165/83 (11/09 0949) Pulse Rate: 102 (11/09 0812)  Labs: Recent Labs    01/22/20 0258 01/22/20 0258 01/22/20 1045 01/22/20 1858 01/23/20 0544 01/23/20 0544 01/23/20 1400 01/23/20 2200 01/24/20 0339 01/24/20 1009  HGB 7.2*   < >   < > 8.7* 7.4*  --   --   --  7.0*  --   HCT 23.0*  --    < > 27.3* 23.1*  --   --   --  22.0*  --   PLT 366  --    < > 275 252  --   --   --  268  --   HEPARINUNFRC 0.44   < >  --   --  0.21*   < > 0.24* 0.22*  --  0.29*  CREATININE 0.90  --   --   --   --   --   --   --   --   --   CKTOTAL 64  --   --   --   --   --   --   --   --   --    < > = values in this interval not displayed.    Estimated Creatinine Clearance: 100.3 mL/min (by C-G formula based on SCr of  0.9 mg/dL).   Medical History: Past Medical History:  Diagnosis Date  . Allergy   . Anxiety   . Arthritis    left shoulder  . Bronchitis   . Chest pain 07/14/2016  . Complication of anesthesia   . DJD (degenerative joint disease) 11/29/2011  . Hypertension   . Neuromuscular disorder ([place])    carpal tunnel bilateral, ulner nerve surgery  . PONV (postoperative nausea and vomiting)   . Septic arthritis of knee, right ([place]) 11/29/2011  . Spinal headache    "long time ago"   Assessment: 54 yr old male POD#2 I&D L knee and L anterior thigh abscess. Pharmacy consulted 10/30 to switch IV heparin to Lovenox for possible pulmonary embolism due to difficult IV access, need for central line placement and CTA.   10/31: CTA showed bilateral non-occlusive PE. Pharmacy now consulted to switch back to heparin with plans  to return to OR for repeat I&D.   Heparin infusion up to 1900 units/hr and heparin level slightly subtherapeutic at 0.29. Hgb down to 7, plt 268. No s/sx of bleeding or infusion issues per RN- currently getting a unit of PRBC today.  Goal of Therapy:   Heparin level 0.3-0.7 units/mL Monitor platelets by anticoagulation protocol: Yes   Plan:  Inc heparin to 2000 units/hr Order heparin level in 6 hours Monitor daily HL, CBC, and for s/sx of bleeding   Antonietta Jewel, PharmD, BCCCP Clinical Pharmacist  Phone: [phone] 01/24/2020 11:19 AM  Please check AMION for all [place] phone numbers After 10:00 PM, call [place] [phone]

## 2020-01-24 NOTE — Progress Notes (Signed)
[facility] for Heparin Indication: pulmonary embolus  Allergies  Allergen Reactions  . Ivp Dye [Iodinated Diagnostic Agents] Anaphylaxis    Can be pre-treated with Benadryl  . Metrizamide Anaphylaxis  . Other Other (See Comments)    Under no circumstances will the patient agree to a PICC line  . Reglan [Metoclopramide] Anaphylaxis  . Shellfish-Derived Products Anaphylaxis  . Codeine Hives  . Lidocaine Other (See Comments) and Rash    Pt not sure if this is an actual allergy - might have been a one time incident  . Methadone Hives  . Morphine Swelling and Rash    Local reaction to IV being pushed too fast  . Propoxyphene Hives  . Toradol [Ketorolac Tromethamine] Itching    Patient having systemic itching after receiving IV dose  . Iodine Rash    Reports localized reaction at IV site. Able to tolerate with Benadryl.   . Tape Rash  . Vancomycin Rash    Has had vancomycin since this reaction and had no reaction at all    Patient Measurements: Height: 5\' 10"  (177.8 cm) Weight: 83.3 kg (183 lb 10.3 oz) IBW/kg (Calculated) : 73 Heparin Dosing Weight: 79.4 kg  Vital Signs: Temp: 98.5 F (36.9 C) (11/09 1600) Temp Source: Oral (11/09 1600) BP: 135/57 (11/09 1600) Pulse Rate: 110 (11/09 1301)  Labs: Recent Labs    01/22/20 0258 01/22/20 0258 01/22/20 1045 01/22/20 1858 01/23/20 0544 01/23/20 1400 01/23/20 2200 01/24/20 0339 01/24/20 1009 01/24/20 2008  HGB 7.2*   < >   < > 8.7* 7.4*  --   --  7.0*  --   --   HCT 23.0*  --    < > 27.3* 23.1*  --   --  22.0*  --   --   PLT 366  --    < > 275 252  --   --  268  --   --   HEPARINUNFRC 0.44   < >  --   --  0.21*   < > 0.22*  --  0.29* 0.33  CREATININE 0.90  --   --   --   --   --   --   --   --   --   CKTOTAL 64  --   --   --   --   --   --   --   --   --    < > = values in this interval not displayed.    Estimated Creatinine Clearance: 100.3 mL/min (by C-G formula based on SCr of  0.9 mg/dL).   Medical History: Past Medical History:  Diagnosis Date  . Allergy   . Anxiety   . Arthritis    left shoulder  . Bronchitis   . Chest pain 07/14/2016  . Complication of anesthesia   . DJD (degenerative joint disease) 11/29/2011  . Hypertension   . Neuromuscular disorder ([place])    carpal tunnel bilateral, ulner nerve surgery  . PONV (postoperative nausea and vomiting)   . Septic arthritis of knee, right ([place]) 11/29/2011  . Spinal headache    "long time ago"   Assessment: 54 yr old male post-op I&D L knee and L anterior thigh abscess. Pharmacy consulted 10/30 to switch IV heparin to Lovenox for possible pulmonary embolism due to difficult IV access, need for central line placement and CTA.   10/31: CTA showed bilateral non-occlusive PE. Pharmacy now consulted to switch back to heparin with plans  to return to OR for repeat I&D.   Heparin infusion up to 2000 units/hr and heparin level now therapeutic at 0.33. Hgb down to 7, plt 268. No s/sx of bleeding or infusion issues per RN. S/p 1u PRBC today.  Goal of Therapy:   Heparin level 0.3-0.7 units/mL Monitor platelets by anticoagulation protocol: Yes   Plan:  Continue heparin at 2000 units/hr - will not be more aggressive to target middle of therapeutic range for now due to low Hg requiring transfusions this admit Confirmatory heparin level with AM labs Monitor daily CBC, s/sx of bleeding    Arturo Morton, PharmD, BCPS Please check AMION for all [place] contact numbers Clinical Pharmacist 01/24/2020 8:48 PM

## 2020-01-24 NOTE — Progress Notes (Signed)
Occupational Therapy Treatment Patient Details Name: Jason Jason [name] MRN: [id number] DOB: 1965/03/26 Today's Date: 01/24/2020    History of present illness Pt adm lt septic knee arthritis and lt distal femur/proximal tibia osteomyelitis. On 10/28 underwent I&D of lt knee and lt anterior thigh abscess and placement of antibiotic beads and cement. On 10/30 pt found to have bil PE. On 11/3 pt underwent Arthrotomy left knee and excisional debridement soft tissue and capsule, Bony resection of the distal femur and proximal tibia resected for osteomyelitis. and placement of incisional wound VAC. PMH - Rt AKA, rt thr, Munchausen syndrome, HTN, anxiety, arthritis, multiple shoulder surgeries.    OT comments  Pt performing long sitting tasks today and some ankle ROM. Pt self limiting behavior at this time and denying need to perform ADL tasks. Pt education on need for OOB transfers required. Pt would benefit from OT skilled services. Next sessions to RA dropping to 1x weekly until pt is more motivated to participate with therapy OOB. OT following acutely.     Follow Up Recommendations  SNF;Supervision/Assistance - 24 hour    Equipment Recommendations  3 in 1 bedside commode;Wheelchair (measurements OT);Wheelchair cushion (measurements OT)    Recommendations for Other Services      Precautions / Restrictions Precautions Precautions: Fall Required Braces or Orthoses: Knee Immobilizer - Left Knee Immobilizer - Left: On at all times Restrictions Weight Bearing Restrictions: Yes LLE Weight Bearing: Weight bearing as tolerated       Mobility Bed Mobility Overal bed mobility: Needs Assistance Bed Mobility: Supine to Sit;Sit to Supine;Rolling Rolling: Min assist   Supine to sit: Min assist Sit to supine: Min assist   General bed mobility comments: Long sitting only; pt unwilling to sit at EOB as "my pain is too great and I feel a little woozy after getting blood."  Transfers                       Balance Overall balance assessment: Needs assistance Sitting-balance support: Bilateral upper extremity supported Sitting balance-Leahy Scale: Poor Sitting balance - Comments: long sitting only                                   ADL either performed or assessed with clinical judgement   ADL Overall ADL's : Needs assistance/impaired                                     Functional mobility during ADLs: Min guard;Cueing for safety General ADL Comments: Pt unwilling to perform ADL tasks at this time. Session focused on bed mobility.     Vision   Vision Assessment?: No apparent visual deficits   Perception     Praxis      Cognition Arousal/Alertness: Awake/alert Behavior During Therapy: WFL for tasks assessed/performed Overall Cognitive Status: Within Functional Limits for tasks assessed                                 General Comments: hx of suspected Munchausen disorder.        Exercises     Shoulder Instructions       General Comments Pt continues to be self limiting.  Discussion about need for progression to get out of hospital bed and that we need  to see progress to d/Jason [name] from here. Pt verbalized that he understood, but did not change his mind about getting OOB.    Pertinent Vitals/ Pain       Pain Assessment: 0-10 Pain Score: 8  Pain Location: LLE Pain Descriptors / Indicators: Sharp Pain Intervention(s): Monitored during session;Premedicated before session;Repositioned  Home Living                                          Prior Functioning/Environment              Frequency  Min 2X/week        Progress Toward Goals  OT Goals(current goals can now be found in the care plan section)  Progress towards OT goals: Not progressing toward goals - comment (Pt continues to self limit)  Acute Rehab OT Goals Patient Stated Goal: to keep working with therapy OT Goal Formulation: With  patient Time For Goal Achievement: 01/30/20 Potential to Achieve Goals: Fair ADL Goals Pt Will Perform Grooming: with set-up;sitting Pt Will Perform Lower Body Bathing: with set-up;sit to/from stand Pt Will Perform Lower Body Dressing: with set-up;sit to/from stand Pt Will Transfer to Toilet: with min guard assist;ambulating;regular height toilet  Plan Discharge plan remains appropriate;Discharge plan needs to be updated    Co-evaluation                 AM-PAC OT "6 Clicks" Daily Activity     Outcome Measure   Help from another person eating meals?: None Help from another person taking care of personal grooming?: A Little Help from another person toileting, which includes using toliet, bedpan, or urinal?: A Lot Help from another person bathing (including washing, rinsing, drying)?: A Lot Help from another person to put on and taking off regular upper body clothing?: A Little Help from another person to put on and taking off regular lower body clothing?: A Lot 6 Click Score: 16    End of Session Equipment Utilized During Treatment: Left knee immobilizer  OT Visit Diagnosis: Pain;Other abnormalities of gait and mobility (R26.89);Muscle weakness (generalized) (M62.81) Pain - Right/Left: Left Pain - part of body: Knee   Activity Tolerance Patient limited by pain   Patient Left in bed;with call bell/phone within reach   Nurse Communication Mobility status        Time: 1250-1306 OT Time Calculation (min): 16 min  Charges: OT General Charges $OT Visit: 1 Visit OT Treatments $Therapeutic Activity: 8-22 mins    Jason Jason [name], OTR/L Acute Rehabilitation Services Pager: [phone] Office: [phone]    Jason Jason [name] 01/24/2020, 5:05 PM

## 2020-01-24 NOTE — Progress Notes (Addendum)
CSW attempted to complete SNF workup. Patient states he is not feeling well. Patient asked CSW to stop back by later to complete assessment. CSW will stop back by in the am for full SNF workup.

## 2020-01-24 NOTE — Progress Notes (Signed)
Physical Therapy Treatment Patient Details Name: Jason [name] MRN: [id number] DOB: 08/11/1965 Today's Date: 01/24/2020    History of Present Illness Pt adm lt septic knee arthritis and lt distal femur/proximal tibia osteomyelitis. On 10/28 underwent I&D of lt knee and lt anterior thigh abscess and placement of antibiotic beads and cement. On 10/30 pt found to have bil PE. On 11/3 pt underwent Arthrotomy left knee and excisional debridement soft tissue and capsule, Bony resection of the distal femur and proximal tibia resected for osteomyelitis. and placement of incisional wound VAC. PMH - Rt AKA, rt thr, Munchausen syndrome, HTN, anxiety, arthritis, multiple shoulder surgeries.     PT Comments    Pt with very limited progress due to pain and self limiting behaviors. Will continue to work with pt to progress mobility.    Follow Up Recommendations  SNF (If pt refuses then will need HH)     Equipment Recommendations  None recommended by PT    Recommendations for Other Services       Precautions / Restrictions Precautions Precautions: Fall Required Braces or Orthoses: Knee Immobilizer - Left Knee Immobilizer - Left: On at all times    Mobility  Bed Mobility Overal bed mobility: Needs Assistance Bed Mobility: Supine to Sit;Sit to Supine     Supine to sit: Min assist Sit to supine: Min assist   General bed mobility comments: Pt able to come to long sitting with supervision. Min assist to move LLE off EOB so pt could sit EOB and assist to bring RLE back into bed when returning to supine.  Transfers                    Ambulation/Gait                 Stairs             Wheelchair Mobility    Modified Rankin (Stroke Patients Only)       Balance Overall balance assessment: Needs assistance Sitting-balance support: Feet supported;Bilateral upper extremity supported (I supported LLE in elevated position due to pain) Sitting balance-Leahy Scale:  Poor Sitting balance - Comments: Pt sat EOB with LLE supported by me with ~ 8" of foot and leg off of bed. Pt unable to tolerate bring LLE further off of bed and letting LE down to floor.                                     Cognition Arousal/Alertness: Awake/alert Behavior During Therapy: WFL for tasks assessed/performed Overall Cognitive Status: Within Functional Limits for tasks assessed                                 General Comments: hx of suspected Munchausen disorder.      Exercises      General Comments        Pertinent Vitals/Pain Pain Assessment: 0-10 Pain Score: 9  Pain Location: LLE Pain Descriptors / Indicators: Throbbing Pain Intervention(s): Monitored during session;Repositioned;Patient requesting pain meds-RN notified    Home Living                      Prior Function            PT Goals (current goals can now be found in the care plan section) Progress towards PT goals: Not  progressing toward goals - comment    Frequency    Min 3X/week      PT Plan Discharge plan needs to be updated    Co-evaluation              AM-PAC PT "6 Clicks" Mobility   Outcome Measure  Help needed turning from your back to your side while in a flat bed without using bedrails?: A Little Help needed moving from lying on your back to sitting on the side of a flat bed without using bedrails?: None Help needed moving to and from a bed to a chair (including a wheelchair)?: Total Help needed standing up from a chair using your arms (e.g., wheelchair or bedside chair)?: Total Help needed to walk in hospital room?: Total Help needed climbing 3-5 steps with a railing? : Total 6 Click Score: 11    End of Session   Activity Tolerance: Patient limited by pain (behavior) Patient left: in bed;with call bell/phone within reach;with bed alarm set;with nursing/sitter in room Nurse Communication: Mobility status PT Visit Diagnosis: Other  abnormalities of gait and mobility (R26.89);Pain Pain - Right/Left: Left Pain - part of body: Knee;Leg     Time: 1735-6701 PT Time Calculation (min) (ACUTE ONLY): 11 min  Charges:  $Therapeutic Activity: 8-22 mins                     [place] Pager [phone] Office [place] 01/24/2020, 4:46 PM

## 2020-01-24 NOTE — Progress Notes (Signed)
Interventional Radiology consulted for a Tunneled PICC line for long term antibiotics.   54 y.o. male inpatient. History of HTN, Right AKA, Mnchhausen syndrome. IR placed an US guided IV  on 10.31.21. Per Team patient has been manipulating the IV and there is erythema at the exit site. Plan is for patient to be inhouse for 6 weeks of antibiotics.  After discussion with IR Attending (Dr. Shellia Cleverly) recommend team continue to utilize the existing IV should the IV become non functional recommend Team reconsult IR for an IV start in the other arm. This was communicated directly to the Team who is an agreement with the plan of care.

## 2020-01-24 NOTE — Progress Notes (Signed)
Patient is lying in bed status post irrigation and debridement of left septic knee.  He does have his knee immobilizer on.  None new drainage appreciated in the wound canister wound VAC is working with 2 green checks   Encourage patient to try to work with physical therapy today plan for IV antibiotics disposition still in question.

## 2020-01-24 NOTE — Progress Notes (Signed)
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  Bright red blood per rectum documented Munchhausen syndrome [?  Surreptitiously infecting himself and his IV lines and has been documented clearly on [place] admitted 01/11/2020 syncope chest pain knee pain has a history of left knee septic joint infection with presumed Munchhausen related infection-blood culture grew strep angina gnosis wound culture in OR grew strep angina gnosis strep para sanguinous and strep mitis strep salivary us-it has been suspected that he is manipulating and self inoculating infecting his left knee  admitted with chest pain on admission Eventually found to have concerns for infection in the left knee and was seen by orthopedic surgery who has done 2 procedures on the leg to date with antibiotic cement bleeding on 10/20 and then excisional debridement with bony resection of distal fibula and proximal tibia resected for osteomyelitis with wound VAC  Patient had nonspecific abdominal pain during hospital stay over 11 5 through 11/6 without source-CT scans and x-rays were negative He then developed severe knee pain coming back from CT abdomen pelvis with bloody drainage in the setting of not wearing his knee immobilizer which is since resolved and does not require operative management as per orthopedics   Assessment & Plan:   Principal Problem:   Bilateral pulmonary embolism ([place]) Active Problems:   Essential hypertension   S/P AKA (above knee amputation) unilateral, right (HCC)   Other chronic pain   Septic arthritis (HCC)   Syncope   Elevated troponin   Chest pain   Bright red blood per rectum   Anemia   Encounter for central line placement    Osteomyelitis of left knee region ([place])   1. Bleeding into the left thigh and knee a. may have malpositioned it?-Is wearing a knee brace now-orthopedics recommends no further operative management and to use knee brace at all times b. Initially hypotensive tachycardic secondary to pain which is now better controlled 2. Septic arthritis Prevotella Denticola in wound culture 10/28 in the setting of left AKA 11/2011 with recurrent admissions to [facility] 3. 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 Underwent antibiotic bead placement 10/28 b. Underwent on 11/3 excisional debridement and bony resection of distal femur and proximal tibia c. Infectious disease recommending long-term IV antibiotics-operative culture 11/3are negative d. Increased oxycodone to 20 every 3 as needed, spaced out Dilaudid to 3 mg every 3 hours and have clearly mentioned to him to use oral medications  in preference to the IV and that we will be de-escalating IV meds over the next several days-he was told this in front of nursing staff and is aware 4. Continuing at this time Unasyn and appreciate ID follow-up and inputAcute anemia secondary to malposition from left knee thigh a. Status post multiple transfusions and transfusing on 11/9 b.  continue heparin at this time c. Because of bleeding would continue heparin for now and once bleeding seems to subside can may be changed to [place] 5. bilateral submassive pulmonary embolisms PSI score 1 diagnosed on 01/15/2020 a. Pulmonology saw and cleared the patient for surgical intervention b. Overall needs 3 to 6 months of anticoagulation 6. Sinus tachycardia a. Probably secondary to pain/anxiety b. Start metoprolol 12.5 twice daily c. Monitor trends 7. Earlier during stay in hospital pain had chest pain  a. Probably secondary to PE no further work-up cardiology signed off 8. Abdominal pain query cause a. Patient complained of left lower quadrant pain b. Abdominal x-ray two-view  negative c. CT scan abdomen pelvis negative for any specific pathology d. No work-up needs good bowel regimen continue laxatives 9. syncopal collapse a. Probably secondary to pulmonary embolism 10. mild hypokalemia a. Resume replacement with kdur 40 b. Repeat labs in a.m.  DVT prophylaxis: Currently on heparin Code Status: Full CODE STATUS Family Communication: No family present currently Disposition: Inpatient  Status is: Inpatient  Remains inpatient appropriate because:Hemodynamically unstable, Persistent severe electrolyte disturbances, Unsafe d/c plan and IV treatments appropriate due to intensity of illness or inability to take PO   Dispo: The patient is from: Home              Anticipated d/c is to: Home              Anticipated d/c date is: > 3 days              Patient currently is not medically stable to d/c.    Consultants:   Orthopedics  ID  Cardiology  Pulmonology  Procedures: CT chest Echo I&D of the left knee 10/28 Dr. Doreatha Martin  Antimicrobials: Unasyn currently   Subjective:  He seems quite comfortable at rest but states his pain is close to 10 on 10 He recalls our conversation and although his not too happy about planning to de-escalate pain meds we have started that process today from IV to p.o. Nursing reports that the area of his IV site looks a little inflamed so we will get a tunneled catheter today   Objective: Vitals:   01/24/20 0334 01/24/20 0812 01/24/20 0949 01/24/20 1032  BP: (!) 152/92 (!) 153/73 (!) 165/83   Pulse: 98 (!) 102    Resp: 18  17 17   Temp: 99.2 F (37.3 C) 98.8 F (37.1 C) 97.8 F (36.6 C) 98.9 F (37.2 C)  TempSrc: Oral Oral Oral Oral  SpO2: 100% 98%    Weight: 83.3 kg     Height:        Intake/Output Summary (Last 24 hours) at 01/24/2020 1053 Last data filed at 01/24/2020 4128 Gross per 24 hour  Intake 90 ml  Output 3575 ml  Net -3485 ml   Filed Weights   01/22/20 0553 01/23/20 0458 01/24/20 0334  Weight:  83.3 kg 84 kg 83.3 kg    Examination:  Coherent NCAT does not seem to be in distress or in overt pain Chest is clear Catheter examined dressing seems to come loose to some degree Abdomen soft no rebound no guarding   Data Reviewed: I have personally reviewed following labs and imaging studies  WBC 12.2-->14.1-->11.8-->12.9->20.9-->13.9-->17.7 , hemoglobin 8.4--> 6.5-->8.1-->7.2-->6.1-->2 units PRBC-->7.4-->7.0--giving 1 more unit of packed red blood cells  Radiology Studies: No results found.   Scheduled Meds: . sodium chloride   Intravenous Once  . acetaminophen  1,000 mg Oral Q6H  . acetaminophen  650 mg Oral Once  . Chlorhexidine Gluconate Cloth  6 each Topical Daily  . docusate sodium  100 mg Oral BID  . furosemide  20 mg Intravenous Once  . gabapentin  300 mg Oral TID  . methocarbamol  1,000 mg Oral Q8H  . metoprolol tartrate  12.5 mg Oral BID  . potassium chloride  40 mEq Oral Daily   Continuous Infusions: . sodium chloride 100 mL/hr at 01/24/20 0347  . ampicillin-sulbactam (UNASYN) IV 3 g (01/24/20  8527)  . heparin 1,900 Units/hr (01/24/20 0744)  . methocarbamol (ROBAXIN) IV       LOS: 13 days    Time spent: [place], MD [facility] To contact the attending provider between 7A-7P or the covering provider during after hours 7P-7A, please log into the web site [email or web address] and access using universal [place] password for that web site. If you do not have the password, please call the hospital operator.  01/24/2020, 10:53 AM

## 2020-01-24 NOTE — Progress Notes (Signed)
[place] for Heparin Indication: pulmonary embolus  Allergies  Allergen Reactions  . Ivp Dye [Iodinated Diagnostic Agents] Anaphylaxis    Can be pre-treated with Benadryl  . Metrizamide Anaphylaxis  . Other Other (See Comments)    Under no circumstances will the patient agree to a PICC line  . Reglan [Metoclopramide] Anaphylaxis  . Shellfish-Derived Products Anaphylaxis  . Codeine Hives  . Lidocaine Other (See Comments) and Rash    Pt not sure if this is an actual allergy - might have been a one time incident  . Methadone Hives  . Morphine Swelling and Rash    Local reaction to IV being pushed too fast  . Propoxyphene Hives  . Toradol [Ketorolac Tromethamine] Itching    Patient having systemic itching after receiving IV dose  . Iodine Rash    Reports localized reaction at IV site. Able to tolerate with Benadryl.   . Tape Rash  . Vancomycin Rash    Has had vancomycin since this reaction and had no reaction at all    Patient Measurements: Height: 5\' 10"  (177.8 cm) Weight: 84 kg (185 lb 3 oz) IBW/kg (Calculated) : 73 Heparin Dosing Weight: 79.4 kg  Vital Signs: Temp: 98.7 F (37.1 C) (11/08 1959) Temp Source: Oral (11/08 1959) BP: 135/81 (11/08 2216) Pulse Rate: 118 (11/08 2216)  Labs: Recent Labs    01/21/20 0706 01/21/20 0706 01/22/20 0258 01/22/20 0258 01/22/20 1045 01/22/20 1045 01/22/20 1858 01/23/20 0544 01/23/20 1400 01/23/20 2200  HGB 8.1*   < > 7.2*   < > 6.1*   < > 8.7* 7.4*  --   --   HCT 26.4*   < > 23.0*   < > 20.0*  --  27.3* 23.1*  --   --   PLT 345   < > 366   < > 317  --  275 252  --   --   HEPARINUNFRC  --    < > 0.44   < >  --   --   --  0.21* 0.24* 0.22*  CREATININE 0.77  --  0.90  --   --   --   --   --   --   --   CKTOTAL  --   --  64  --   --   --   --   --   --   --    < > = values in this interval not displayed.    Estimated Creatinine Clearance: 100.3 mL/min (by C-G formula based on SCr of  0.9 mg/dL).   Medical History: Past Medical History:  Diagnosis Date  . Allergy   . Anxiety   . Arthritis    left shoulder  . Bronchitis   . Chest pain 07/14/2016  . Complication of anesthesia   . DJD (degenerative joint disease) 11/29/2011  . Hypertension   . Neuromuscular disorder ([place])    carpal tunnel bilateral, ulner nerve surgery  . PONV (postoperative nausea and vomiting)   . Septic arthritis of knee, right ([place]) 11/29/2011  . Spinal headache    "long time ago"   Assessment: 54 yr old male POD#2 I&D L knee and L anterior thigh abscess. Pharmacy consulted 10/30 to switch IV heparin to Lovenox for possible pulmonary embolism due to difficult IV access, need for central line placement and CTA.   10/31: CTA showed bilateral non-occlusive PE. Pharmacy now consulted to switch back to heparin with plans to return to  OR for repeat I&D.    No bleeding noted.  Heparin drip 1550 uts/hr HL 0.44 at goal.   Heparin currently off for I&D.   11/9 AM update:  Heparin level remains low No issues per RN  Goal of Therapy:   Heparin level 0.3-0.7 units/mL Monitor platelets by anticoagulation protocol: Yes   Plan:  Inc heparin to 1900 units/hr 0900 heparin level  Narda Bonds, PharmD, BCPS Clinical Pharmacist Phone: [phone]

## 2020-01-25 ENCOUNTER — Inpatient Hospital Stay (HOSPITAL_COMMUNITY): Payer: Medicare Other

## 2020-01-25 DIAGNOSIS — R778 Other specified abnormalities of plasma proteins: Secondary | ICD-10-CM | POA: Diagnosis not present

## 2020-01-25 DIAGNOSIS — I10 Essential (primary) hypertension: Secondary | ICD-10-CM | POA: Diagnosis not present

## 2020-01-25 DIAGNOSIS — M009 Pyogenic arthritis, unspecified: Secondary | ICD-10-CM | POA: Diagnosis not present

## 2020-01-25 LAB — CBC
HCT: 22.2 % — ABNORMAL LOW (ref 39.0–52.0)
Hemoglobin: 7.1 g/dL — ABNORMAL LOW (ref 13.0–17.0)
MCH: 28 pg (ref 26.0–34.0)
MCHC: 32 g/dL (ref 30.0–36.0)
MCV: 87.4 fL (ref 80.0–100.0)
Platelets: 284 10*3/uL (ref 150–400)
RBC: 2.54 MIL/uL — ABNORMAL LOW (ref 4.22–5.81)
RDW: 17.2 % — ABNORMAL HIGH (ref 11.5–15.5)
WBC: 14.9 10*3/uL — ABNORMAL HIGH (ref 4.0–10.5)
nRBC: 0.1 % (ref 0.0–0.2)

## 2020-01-25 LAB — COMPREHENSIVE METABOLIC PANEL
ALT: 10 U/L (ref 0–44)
AST: 18 U/L (ref 15–41)
Albumin: 2.1 g/dL — ABNORMAL LOW (ref 3.5–5.0)
Alkaline Phosphatase: 91 U/L (ref 38–126)
Anion gap: 11 (ref 5–15)
BUN: 7 mg/dL (ref 6–20)
CO2: 24 mmol/L (ref 22–32)
Calcium: 8 mg/dL — ABNORMAL LOW (ref 8.9–10.3)
Chloride: 102 mmol/L (ref 98–111)
Creatinine, Ser: 0.84 mg/dL (ref 0.61–1.24)
GFR, Estimated: >60 mL/min (ref >60)
Glucose, Bld: 184 mg/dL — ABNORMAL HIGH (ref 70–99)
Potassium: 3.3 mmol/L — ABNORMAL LOW (ref 3.5–5.1)
Sodium: 137 mmol/L (ref 135–145)
Total Bilirubin: 0.6 mg/dL (ref 0.3–1.2)
Total Protein: 5.5 g/dL — ABNORMAL LOW (ref 6.5–8.1)

## 2020-01-25 LAB — PREPARE RBC (CROSSMATCH)

## 2020-01-25 LAB — HEPARIN LEVEL (UNFRACTIONATED)
Heparin Unfractionated: 0.23 IU/mL — ABNORMAL LOW (ref 0.30–0.70)
Heparin Unfractionated: 1.68 IU/mL — ABNORMAL HIGH (ref 0.30–0.70)
Heparin Unfractionated: 0.32 IU/mL (ref 0.30–0.70)

## 2020-01-25 LAB — HEMOGLOBIN AND HEMATOCRIT, BLOOD
HCT: 21.2 % — ABNORMAL LOW (ref 39.0–52.0)
Hemoglobin: 6.8 g/dL — CL (ref 13.0–17.0)

## 2020-01-25 LAB — MAGNESIUM: Magnesium: 1.8 mg/dL (ref 1.7–2.4)

## 2020-01-25 MED ORDER — SODIUM CHLORIDE 0.9% FLUSH
20.0000 mL | Freq: Two times a day (BID) | INTRAVENOUS | Status: DC
  Administered 2020-01-25 – 2020-01-29 (×8): 20 mL

## 2020-01-25 MED ORDER — HYDROMORPHONE HCL 1 MG/ML IJ SOLN
1.0000 mg | INTRAMUSCULAR | Status: DC | PRN
  Administered 2020-01-25 – 2020-01-28 (×14): 1 mg via INTRAVENOUS
  Filled 2020-01-25 (×14): qty 1

## 2020-01-25 MED ORDER — SODIUM CHLORIDE 0.9% IV SOLUTION: Freq: Once | INTRAVENOUS | Status: DC

## 2020-01-25 MED ORDER — LIDOCAINE HCL 1 % IJ SOLN
INTRAMUSCULAR | Status: DC | PRN
  Administered 2020-01-25: 10 mL

## 2020-01-25 MED ORDER — POTASSIUM CHLORIDE CRYS ER 20 MEQ PO TBCR
40.0000 meq | EXTENDED_RELEASE_TABLET | Freq: Once | ORAL | Status: AC
  Administered 2020-01-25: 40 meq via ORAL
  Filled 2020-01-25: qty 2

## 2020-01-25 MED ORDER — LIDOCAINE HCL 1 % IJ SOLN
INTRAMUSCULAR | Status: AC
  Filled 2020-01-25: qty 20

## 2020-01-25 NOTE — Progress Notes (Signed)
Patient complained of having a sore bottom due to a "heat rash" as well as laying in the bed, this NT offered to apply a sacral mepilex foam dressing, barrier cream to area or to have nurse apply anti fungal powder after peri care. Patient refused all interventions. RN Sula Soda made aware.

## 2020-01-25 NOTE — Progress Notes (Signed)
CRITICAL VALUE ALERT  Critical Value:  H&H 6.8/21/2  Date & Time Notied:  01/25/20 1654  Provider Notified: Lonny Prude, MD  Orders Received/Actions taken: Awaiting orders

## 2020-01-25 NOTE — Progress Notes (Signed)
The patient is postop day 8 status post irrigation and debridement of left septic knee.  He is lying in bed comfortably.   Cultures negative for organism.  Current plan is for antibiotics per infectious disease.  VAC canister has had no further drainage.  Is working with 2 green checks.  Wound VAC was removed today.  In the process of removing the wound VAC there was a  there is a large hematoma that came through the wound, small wound dehiscence in the central portion of the wound where the hematoma was expressed.  Once the hematoma Thoma drained bleeding significantly decreased.  Overall well apposed wound edges.  More than likely still some pockets of hematoma.   Clean dry dressing was applied.

## 2020-01-25 NOTE — Progress Notes (Signed)
PROGRESS NOTE    VALGENE DELOATCH  QZR:[id number] DOB: May 24, 1965 DOA: 01/11/2020 PCP: Patient, No Pcp Per   Brief Narrative: FARES RAMTHUN is a 54 y.o. male with medical history significant of  Sp AKA, septir arthritis of left knee, h/o opioid dependence,documented Munchausen syndrome. Patient presented secondary to LOC, chest pain and knee pain. Syncope workup in addition to management of septic arthritis.   Assessment & Plan:   Principal Problem:   Bilateral pulmonary embolism (HCC) Active Problems:   Essential hypertension   S/P AKA (above knee amputation) unilateral, right (HCC)   Other chronic pain   Septic arthritis (HCC)   Syncope   Elevated troponin   Chest pain   Bright red blood per rectum   Anemia   Encounter for central line placement   Osteomyelitis of left knee region [facility])   Chest pain Present on admission and has improved. Cardiology consulted. HS troponin elevated: 77>93>68. Elevated D-dimer of 9.72 and Transthoracic Echocardiogram significant for RV dilation with concern for possible PE. No chest pain currently. On room air. Negative venous duplex for DVT. PE confirmed on CTA chest. Chest pain improved with treatment of PEs.  Bilateral PEs Submassive PE Non-occlusive on CTA. No heart strain per CT but initial Transthoracic Echocardiogram with moderate RV dilation and reduced function. Repeat Transthoracic Echocardiogram significant for improved RV. Pulmonology consulted and recommended to continue IV heparin with eventual transition to NOAC. -Continue Heparin IV  Syncope and collapse Likely secondary to PE.  -Management above  Septic arthritis Recurring issue. Per chart review, patient was previously managed at [facility] with concern patient may have Munchausen and is possibly inoculating himself. Previous cultures (10/3) from OSH significant for significant for strep mitis/oralis/anginosus/parasnguinis with blood culture significant for  strep anginosus/strepp salivarius/gemella haemolysans (as mentioned in H&P) with susceptibilities to penicillin. Prevotella denticola isolated on this admission. Orthopedic surgery consulted and patient underwent I&D on 10/28 in the OR with vancomycin/tobramycin cement beads placed. Arthrotomy and excisional debridement performed on 11/3 with vancomycin/gentamicin stimulant antibiotic bead placement. Started on Unasyn IV per ID -ID recommendations: Unasyn IV x6 weeks with end date of 02/29/2020 -Orthopedic surgery recommendations: monitoring  Bright red blood per rectum FOBT negative x1. Hemoglobin tended down slightly from admission. No recurrence.  Acute on chronic Anemia Unknown etiology. Baseline appears to be between 8-9. Worsened hemoglobin with a low of 6.5 on 10/29. Patient has received about 4.5 units of PRBC to date. Patient with large hematoma of wound found on 11/10 which is likely contributing to continued anemia. Complicated by heparin drip -H&H today -Daily CBC   DVT prophylaxis: SCDs Code Status:   Code Status: Full Code Family Communication: None at bedside Disposition Plan: Discharge to SNF pending transition to NOAC once hemoglobin is stable.   Consultants:   Orthopedic surgery  Cardiology  Pulmonology  Infectious  disease  Procedures:   INCISION & DRAINAGE (10/28) 1. Incision and drainage of left septic knee 2. Incision and drainage of left anterior thigh abscess 3. Placement of antibiotic cement  4. Incisional wound vac placement   TRANSTHORACIC ECHOCARDIOGRAM (10/29) IMPRESSIONS    1. The RV is severely dilated with moderately reduced function. Would  consider acute PE to explain tachycardia, syncope, and elevated troponin.  2. Left ventricular ejection fraction, by estimation, is 60 to 65%. The  left ventricle has normal function. The left ventricle has no regional  wall motion abnormalities. Left ventricular diastolic parameters were  normal.  There is the interventricular  septum is flattened in systole, consistent with right ventricular pressure  overload.  3. Right ventricular systolic function is moderately reduced. The right  ventricular size is severely enlarged.  4. The mitral valve is grossly normal. Trivial mitral valve  regurgitation. No evidence of mitral stenosis.  5. The aortic valve is tricuspid. Aortic valve regurgitation is not  visualized. No aortic stenosis is present.   Conclusion(s)/Recommendation(s): Findings consistent with Cor [place]mon[place].   TRANSTHORACIC ECHOCARDIOGRAM (10/31) IMPRESSIONS    1. Left ventricular ejection fraction, by estimation, is 60 to 65%. The  left ventricle has normal function. The left ventricle has no regional  wall motion abnormalities.  2. Right ventricular systolic function is mildly reduced. The right  ventricular size is mildly enlarged.   Comparison(s): The right ventricular systolic function has improved.  Antimicrobials:  Unasyn   Subjective: Large hematoma drained this morning spontaneously at bedside. Still with knee pain. No other concerns.  Objective: Vitals:   01/24/20 1600 01/24/20 2239 01/25/20 0300 01/25/20 1152  BP: (!) 135/57 (!) 145/83 (!) 147/75 130/87  Pulse:  (!) 119 (!) 103 (!) 133  Resp: 17 19 14 15   Temp: 98.5 F (36.9 C) 98.5 F (36.9 C) 99.3 F (37.4 C) 98.7 F (37.1 C)  TempSrc: Oral Oral Oral Oral  SpO2:  98% 97% 100%  Weight:      Height:        Intake/Output Summary (Last 24 hours) at 01/25/2020 1422 Last data filed at 01/25/2020 0300 Gross per 24 hour  Intake 716 ml  Output 1575 ml  Net -859 ml   Filed Weights   01/22/20 0553 01/23/20 0458 01/24/20 0334  Weight: 83.3 kg 84 kg 83.3 kg    Examination:  General exam: Appears calm and comfortable Respiratory system: Clear to auscultation. Respiratory effort normal. Cardiovascular system: S1 & S2 heard, tachycardia with normal rhythm. No murmurs, rubs, gallops or  clicks. Gastrointestinal system: Abdomen is nondistended, soft and nontender. No organomegaly or masses felt. Normal bowel sounds heard. Central nervous system: Alert and oriented. No focal neurological deficits. Musculoskeletal: Right AKA. Left knee wrapped in ACE bandage. Swelling of the knee is noted. 1+ pitting edema of left LE Skin: No cyanosis. No rashes Psychiatry: Judgement and insight appear normal. Mood & affect appropriate.     Data Reviewed: I have personally reviewed following labs and imaging studies  CBC Lab Results  Component Value Date   WBC 14.9 (H) 01/25/2020   RBC 2.54 (L) 01/25/2020   HGB 7.1 (L) 01/25/2020   HCT 22.2 (L) 01/25/2020   MCV 87.4 01/25/2020   MCH 28.0 01/25/2020   PLT 284 01/25/2020   MCHC 32.0 01/25/2020   RDW 17.2 (H) 01/25/2020   LYMPHSABS 0.5 (L) 01/21/2020   MONOABS 0.2 01/21/2020   EOSABS 0.0 01/21/2020   BASOSABS 0.0 96/78/9381     Last metabolic panel Lab Results  Component Value Date   NA 137 01/25/2020   K 3.3 (L) 01/25/2020   CL 102 01/25/2020   CO2 24 01/25/2020   BUN 7 01/25/2020   CREATININE 0.84 01/25/2020   GLUCOSE 184 (H) 01/25/2020   GFRNONAA >60 01/25/2020   GFRAA >60 08/18/2018   CALCIUM 8.0 (L) 01/25/2020   PHOS 3.7 01/18/2020   PROT 5.5 (L) 01/25/2020   ALBUMIN 2.1 (L) 01/25/2020   LABGLOB 2.6 08/14/2017   AGRATIO 1.9 08/14/2017   BILITOT 0.6 01/25/2020   ALKPHOS 91 01/25/2020   AST 18 01/25/2020   ALT 10 01/25/2020  ANIONGAP 11 01/25/2020    CBG (last 3)  No results for input(s): GLUCAP in the last 72 hours.   GFR: Estimated Creatinine Clearance: 107.4 mL/min (by C-G formula based on SCr of 0.84 mg/dL).  Coagulation Profile: No results for input(s): INR, PROTIME in the last 168 hours.  Recent Results (from the past 240 hour(s))  Aerobic/Anaerobic Culture (surgical/deep wound)     Status: None   Collection Time: 01/18/20  4:18 PM   Specimen: Soft Tissue, Other  Result Value Ref Range Status     Specimen Description TISSUE  Final   Special Requests IRRIGATION AND DEBRIDMENT SEPTIC LEFT KNEE  Final   Gram Stain   Final    RARE WBC PRESENT,BOTH PMN AND MONONUCLEAR NO ORGANISMS SEEN    Culture   Final    No growth aerobically or anaerobically. Performed at [facility], [place] [address], [place], [place] [address]    Report Status 01/23/2020 FINAL  Final        Radiology Studies: [facility] PLACEMENT LEFT >5 YRS INC IMG GUIDE  Result Date: 01/25/2020 INDICATION: Patient with osteomyelitis requiring 6 weeks of intravenous antibiotics. Patient has poor venous access, interventional radiology asked to place a PICC. EXAM: ULTRASOUND AND FLUOROSCOPIC GUIDED PICC LINE INSERTION MEDICATIONS: 1% lidocaine 2 mL CONTRAST:  None FLUOROSCOPY TIME:  36 seconds (2 mGy) COMPLICATIONS: None immediate. TECHNIQUE: The procedure, risks, benefits, and alternatives were explained to the patient and informed written consent was obtained. The left upper extremity was prepped with chlorhexidine in a sterile fashion, and a sterile drape was applied covering the operative field. Maximum barrier sterile technique with sterile gowns and gloves were used for the procedure. A timeout was performed prior to the initiation of the procedure. Local anesthesia was provided with 1% lidocaine. After the overlying soft tissues were anesthetized with 1% lidocaine, a micropuncture kit was utilized to access the left basilic vein. Real-time ultrasound guidance was utilized for vascular access including the acquisition of a permanent ultrasound image documenting patency of the accessed vessel. A guidewire was advanced to the level of the superior caval-atrial junction for measurement purposes and the PICC line was cut to length. A peel-away sheath was placed and a 38 cm, 5 [place], dual lumen was inserted to level of the superior caval-atrial junction. A post procedure spot fluoroscopic was obtained. The catheter easily  aspirated and flushed and was secured in place. A dressing was placed. The patient tolerated the procedure well without immediate post procedural complication. FINDINGS: After catheter placement, the tip lies within the superior cavoatrial junction. The catheter aspirates and flushes normally and is ready for immediate use. IMPRESSION: Successful ultrasound and fluoroscopic guided placement of a left basilic vein approach, 38 cm, 5 French, dual lumen PICC with tip at the superior caval-atrial junction. The PICC line is ready for immediate use. Read by: Soyla Dryer, NP Electronically Signed   By: Aletta Edouard M.D.   On: 01/25/2020 10:07        Scheduled Meds: . sodium chloride   Intravenous Once  . acetaminophen  1,000 mg Oral Q6H  . acetaminophen  650 mg Oral Once  . Chlorhexidine Gluconate Cloth  6 each Topical Daily  . docusate sodium  100 mg Oral BID  . gabapentin  300 mg Oral TID  . methocarbamol  1,000 mg Oral Q8H  . metoprolol tartrate  12.5 mg Oral BID  . potassium chloride  40 mEq Oral Daily  . sodium chloride flush  20  mL Intracatheter Q12H   Continuous Infusions: . sodium chloride 100 mL/hr at 01/25/20 0456  . ampicillin-sulbactam (UNASYN) IV 3 g (01/25/20 1129)  . heparin 2,000 Units/hr (01/25/20 0704)  . methocarbamol (ROBAXIN) IV       LOS: 14 days     Cordelia Poche, MD [facility] 01/25/2020, 2:22 PM  If 7PM-7AM, please contact night-coverage [email or web address]

## 2020-01-25 NOTE — Progress Notes (Addendum)
I have seen and examined the patient. I have personally reviewed the clinical findings, laboratory findings, microbiological data and imaging studies. The assessment and treatment plan was discussed with the  Advance Practice Provider, [facility]/Gregory Calone. I agree with her/his documentation except following additions/corrections.  Wound vac removed by Ortho with large hematoma noted. Hb dropped to 6.8. WBC trending down. Afebrile.  No issues with IV abx. PICC line placed. Continue IV Unasyn for 6 weeks with end date 12/15 given complexity of infection with recurrence ( septic arthitis and osteomyelitis). Patient is planned to stay inpatient/supervised setting for completion of IV abx with h/o Munchausen. Monitor CBC and BMP while on IV abx with weekly CR and ESR.   Please call us back when he is approaching end of therapy for follow up in the clinic OR with questions or concerns. Will sign off for now.   Rosiland Oz, MD [facility] [place] [facility] Infect[facility]  Date of Admission:  01/11/2020      Total days of antibiotics 13  Unasyn           ASSESSMENT: Jason [name] is a 54 y.o. male with septic arthritis of the left knee due to prevotella denticola now s/p serial debridements.   He will continue to receive IV therapy in supervised setting (here vs SNF). Not clear he is making much progress with mobility.  Would continue through 6 weeks through December 15th. Unclear if he will stay the whole time. He does not want PICC at home given "problems with it in the past."    PLAN: 1. Continue unasyn  2. Recommend the SNF for ongoing treatment with IV 3. Weekly CBC with diff, CMP, ESR, CRP safety / therapeutic monitoring labs.     Principal Problem:   Bilateral pulmonary embolism (HCC) Active Problems:   Essential hypertension   S/P AKA (above knee amputation) unilateral, right (HCC)    Other chronic pain   Septic arthritis (HCC)   Syncope   Elevated troponin   Chest pain   Bright red blood per rectum   Anemia   Encounter for central line placement   Osteomyelitis of left knee region ([place])   . sodium chloride   Intravenous Once  . acetaminophen  1,000 mg Oral Q6H  . acetaminophen  650 mg Oral Once  . Chlorhexidine Gluconate Cloth  6 each Topical Daily  . docusate sodium  100 mg Oral BID  . gabapentin  300 mg Oral TID  . methocarbamol  1,000 mg Oral Q8H  . metoprolol tartrate  12.5 mg Oral BID  . potassium chloride  40 mEq Oral Daily  . sodium chloride flush  20 mL Intracatheter Q12H    SUBJECTIVE: Doing better today but had a "big explosion of blood from his knee after the vac was removed" today that scared him. Having more pain in the knee today. Received blood transfusion yesterday.  LUE PICC placed today.   Review of Systems: Review of Systems  Constitutional: Negative for chills and fever.  HENT: Negative for tinnitus.   Eyes: Negative for blurred vision and photophobia.  Respiratory: Negative for cough and sputum production.   Cardiovascular: Negative for chest pain.  Gastrointestinal: Negative for diarrhea, nausea and vomiting.  Genitourinary: Negative for dysuria.  Musculoskeletal:       Knee pain+ swelling +  Skin: Negative for rash.  Neurological: Negative for headaches.  Allergies  Allergen Reactions  . Ivp Dye [Iodinated Diagnostic Agents] Anaphylaxis    Can be pre-treated with Benadryl  . Metrizamide Anaphylaxis  . Other Other (See Comments)    Under no circumstances will the patient agree to a PICC line  . Reglan [Metoclopramide] Anaphylaxis  . Shellfish-Derived Products Anaphylaxis  . Codeine Hives  . Lidocaine Other (See Comments) and Rash    Pt not sure if this is an actual allergy - might have been a one time incident  . Methadone Hives  . Morphine Swelling and Rash    Local reaction to IV being pushed too fast  .  Propoxyphene Hives  . Toradol [Ketorolac Tromethamine] Itching    Patient having systemic itching after receiving IV dose  . Iodine Rash    Reports localized reaction at IV site. Able to tolerate with Benadryl.   . Tape Rash  . Vancomycin Rash    Has had vancomycin since this reaction and had no reaction at all    OBJECTIVE: Vitals:   01/24/20 1600 01/24/20 2239 01/25/20 0300 01/25/20 1152  BP: (!) 135/57 (!) 145/83 (!) 147/75 130/87  Pulse:  (!) 119 (!) 103 (!) 133  Resp: _0 Temp: 98.5 F (36.9 C) 98.5 F (36.9 C) 99.3 F (37.4 C) 98.7 F (37.1 C)  TempSrc: Oral Oral Oral Oral  SpO2:  98% 97% 100%  Weight:      Height:       Body mass index is 26.35 kg/m.  Physical Exam Vitals reviewed.  Constitutional:      Appearance: He is well-developed.     Comments: Resting in bed  HENT:     Mouth/Throat:     Mouth: No oral lesions.     Dentition: Normal dentition. No dental caries.  Eyes:     General: No scleral icterus. Cardiovascular:     Rate and Rhythm: Normal rate and regular rhythm.     Heart sounds: Normal heart sounds.  Pulmonary:     Effort: Pulmonary effort is normal.     Breath sounds: Normal breath sounds.  Abdominal:     General: There is no distension.     Palpations: Abdomen is soft.     Tenderness: There is no abdominal tenderness.  Musculoskeletal:     Comments: L leg wrapped in ACE wrap. Swelling to the knee appreciated.   Lymphadenopathy:     Cervical: No cervical adenopathy.  Skin:    General: Skin is warm and dry.     Findings: No rash.  Neurological:     Mental Status: He is alert and oriented to person, place, and time.     Lab Results Lab Results  Component Value Date   WBC 14.9 (H) 01/25/2020   HGB 7.1 (L) 01/25/2020   HCT 22.2 (L) 01/25/2020   MCV 87.4 01/25/2020   PLT 284 01/25/2020    Lab Results  Component Value Date   CREATININE 0.84 01/25/2020   BUN 7 01/25/2020   NA 137 01/25/2020   K 3.3 (L) 01/25/2020   CL  102 01/25/2020   CO2 24 01/25/2020    Lab Results  Component Value Date   ALT 10 01/25/2020   AST 18 01/25/2020   ALKPHOS 91 01/25/2020   BILITOT 0.6 01/25/2020     Microbiology: WCx 10/28 Prevotella denticola    Janene Madeira, MSN, NP-C [place] [facility] Infectious Disease [email or web address] Pager: [phone] Office: [phone] [place]: [phone]

## 2020-01-25 NOTE — TOC Initial Note (Signed)
Transition of Care [facility]) - Initial/Assessment Note    Patient Details  Name: Jason [name] MRN: [id number] Date of Birth: July 11, 1965  Transition of Care [facility]) CM/SW Contact:    Trula Ore, [place] Phone Number: 01/25/2020, 2:20 PM  Clinical Narrative:                  CSW received consult for possible SNF placement at time of discharge. CSW spoke with patient at bedside regarding PT recommendation of SNF placement at time of discharge.Patient comes from home alone. Patient says his son comes over and stays with him three times a week.Patient expressed understanding of PT recommendation and is agreeable to SNF placement at time of discharge. Patient gave CSW permission to fax out initial referral near [place], [place], and [place]. Patient gave CSW permission to let his significant other Belenda Cruise know when he discharges. Patient does not want his medical care discussed with anyone.Patient has not received the COVID vaccines.Patient doesn't not want to receive covid vaccines at this time. No further questions reported at this time. CSW to continue to follow and assist with discharge planning needs.  Expected Discharge Plan: Skilled Nursing Facility Barriers to Discharge: Continued Medical Work up   Patient Goals and CMS Choice Patient states their goals for this hospitalization and ongoing recovery are:: to go to SNF CMS Medicare.gov Compare Post Acute Care list provided to:: Patient Choice offered to / list presented to : Patient  Expected Discharge Plan and Services Expected Discharge Plan: [place]   Discharge Planning Services: CM Consult Post Acute Care Choice: Home Health (has wheelchair) Living arrangements for the past 2 months: [place]: PT, OT [place] Agency: [place] Date [place]: 01/16/20 Time HH Agency Contacted: 23 Representative spoke with at [place]: Adela Lank  Prior Living Arrangements/Services Living arrangements for the past 2 months: [place] with:: Self Patient language and need for interpreter reviewed:: Yes Do you feel safe going back to the place where you live?: No   SNF  Need for Family Participation in Patient Care: Yes (Comment) Care giver support system in place?: Yes (comment) Current home services: DME Criminal Activity/Legal Involvement Pertinent to Current Situation/Hospitalization: No - Comment as needed  Activities of Daily Living Home Assistive Devices/Equipment: Crutches, Shower chair with back, Environmental consultant (specify type), Wheelchair ADL Screening (condition at time of admission) Patient's cognitive ability adequate to safely complete daily activities?: Yes Is the patient deaf or have difficulty hearing?: No Does the patient have difficulty seeing, even when wearing glasses/contacts?: No Does the patient have difficulty concentrating, remembering, or making decisions?: No Patient able to express need for assistance with ADLs?: Yes Does the patient have difficulty dressing or bathing?: No Independently performs ADLs?: Yes (appropriate for developmental age) Does the patient have difficulty walking or climbing stairs?: Yes Weakness of Legs: Left Weakness of Arms/Hands: None  Permission Sought/Granted Permission sought to share information with : Case Manager, Family Supports, Chartered certified accountant granted to share information with : Yes, Verbal Permission Granted  Share Information with NAME: Belenda Cruise -only about discharge  Permission granted to share info w AGENCY: SNF  Permission granted to share info w Relationship: significant other  Permission granted to share info w Contact Information: Belenda Cruise [phone] only about discharge no medical  information  Emotional Assessment Appearance:: Appears stated age Attitude/Demeanor/Rapport: Gracious Affect (typically observed):  Calm Orientation: : Oriented to Self, Oriented to Place, Oriented to  Time, Oriented to Situation Alcohol / Substance Use: Not Applicable Psych Involvement: No (comment)  Admission diagnosis:  Chest pain [R07.9] Syncope, unspecified syncope type [R55] Pyogenic arthritis of left knee joint, due to unspecified organism [place] [facility]) [M00.9] Patient Active Problem List   Diagnosis Date Noted  . Osteomyelitis of left knee region ([place])   . Bilateral pulmonary embolism ([place]) 01/15/2020  . Encounter for central line placement   . Septic arthritis ([place]) 01/11/2020  . Syncope 01/11/2020  . Elevated troponin 01/11/2020  . Chest pain 01/11/2020  . Bright red blood per rectum 01/11/2020  . Anemia 01/11/2020  . Pain management contract broken 09/13/2018  . Benzodiazepine dependence, continuous ([place]) 12/25/2017  . Amputation stump pain ([place]) 11/18/2017  . Opiate dependence ([place]) 05/06/2017  . Primary insomnia 05/06/2017  . Functional diarrhea 05/06/2017  . Phantom limb pain ([place]) 05/06/2017  . Muscle atrophy of lower extremity 05/06/2017  . Other chronic pain 05/06/2017  . Essential hypertension 07/14/2016  . S/P AKA (above knee amputation) unilateral, right ([place]) 07/14/2016  . Shoulder impingement syndrome, left 07/29/2011   PCP:  Patient, No Pcp Per Pharmacy:   [facility], [place] [address] Phone: [phone] Fax: [phone]  [id number] [place], [place] [address] [address] Phone: [phone] Fax: [place], [place] [address] Phone: [phone] Fax: [phone]     Social Determinants of Health (SDOH) Interventions    Readmission Risk Interventions No flowsheet data found.

## 2020-01-25 NOTE — Plan of Care (Signed)
  Problem: Education: Goal: Knowledge of General Education information will improve Description: Including pain rating scale, medication(s)/side effects and non-pharmacologic comfort measures Outcome: Progressing   Problem: Clinical Measurements: Goal: Ability to maintain clinical measurements within normal limits will improve Outcome: Progressing Goal: Will remain free from infection Outcome: Progressing Goal: Diagnostic test results will improve Outcome: Progressing Goal: Cardiovascular complication will be avoided Outcome: Progressing   Problem: Pain Managment: Goal: General experience of comfort will improve Outcome: Progressing   Problem: Safety: Goal: Ability to remain free from injury will improve Outcome: Progressing

## 2020-01-25 NOTE — Procedures (Signed)
PROCEDURE SUMMARY:  Successful placement of image-guided  lumen PICC line to the left basilic vein. Length 38 cm. Tip at lower SVC/RA. No complications. EBL = <5 ml. Ready for use.  Please see imaging section of Epic for full dictation.   Theresa Duty, NP 01/25/2020 10:03 AM

## 2020-01-25 NOTE — Progress Notes (Signed)
Blood transfussion of 1 unit PRBC completed at 2035 and tolerated well by patient. No undue symptoms presented, Vital signs stable, see V/S flowsheet.

## 2020-01-25 NOTE — NC FL2 (Signed)
[place] LEVEL OF CARE SCREENING TOOL     IDENTIFICATION  Patient Name: Jason [name] Birthdate: [birth year] Sex: male Admission Date (Current Location): 01/11/2020  [facility] and [place] Number:  Herbalist and Address:  [facility] [place], [place] [address] [place], [place] [address]      Provider Number: [id number]  Attending Physician Name and Address:  Mariel Aloe, MD  Relative Name and Phone Number:  Belenda Cruise [phone]    Current Level of Care: Hospital Recommended Level of Care: [place] Prior Approval Number:    Date Approved/Denied:   PASRR Number: [id number]  Discharge Plan: SNF    Current Diagnoses: Patient Active Problem List   Diagnosis Date Noted  . Osteomyelitis of left knee region ([place])   . Bilateral pulmonary embolism ([place]) 01/15/2020  . Encounter for central line placement   . Septic arthritis ([place]) 01/11/2020  . Syncope 01/11/2020  . Elevated troponin 01/11/2020  . Chest pain 01/11/2020  . Bright red blood per rectum 01/11/2020  . Anemia 01/11/2020  . Pain management contract broken 09/13/2018  . Benzodiazepine dependence, continuous ([place]) 12/25/2017  . Amputation stump pain ([place]) 11/18/2017  . Opiate dependence ([place]) 05/06/2017  . Primary insomnia 05/06/2017  . Functional diarrhea 05/06/2017  . Phantom limb pain ([place]) 05/06/2017  . Muscle atrophy of lower extremity 05/06/2017  . Other chronic pain 05/06/2017  . Essential hypertension 07/14/2016  . S/P AKA (above knee amputation) unilateral, right ([place]) 07/14/2016  . Shoulder impingement syndrome, left 07/29/2011    Orientation RESPIRATION BLADDER Height & Weight     Self, Time, Situation, Place  Normal Continent Weight: 183 lb 10.3 oz (83.3 kg) Height:  5\' 10"  (177.8 cm)  BEHAVIORAL SYMPTOMS/MOOD NEUROLOGICAL BOWEL NUTRITION STATUS      Continent Diet (See Discharge Summary)  AMBULATORY STATUS COMMUNICATION OF NEEDS Skin    Limited Assist Verbally Other (Comment), Surgical wounds (Ecchymosis arm right,Incision closed leg left,clean,dry,intact changed daily,dressing in place,painful bleeding)                       Personal Care Assistance Level of Assistance  Bathing, Feeding, Dressing Bathing Assistance: Limited assistance Feeding assistance: Independent (able to feed self) Dressing Assistance: Limited assistance     Functional Limitations Info  Sight, Hearing, Speech Sight Info: Adequate Hearing Info: Adequate Speech Info: Adequate    SPECIAL CARE FACTORS FREQUENCY  PT (By licensed PT), OT (By licensed OT)     PT Frequency: 5x min weekly OT Frequency: 5x min weekly            Contractures Contractures Info: Not present    Additional Factors Info  Code Status, Allergies Code Status Info: FULL Allergies Info: Ivp Dye (iodinated Diagnostic Agents),Metrizamide, Other (Under no circumstances will the patient agree to a PICC line),Reglan (metoclopramide),Shellfish-derived Products,Codeine,Lidocaine,Methadone,Morphine,Propoxyphene,Toradol (ketorolac Tromethamine),Iodine,Tape,Vancomycin,           Current Medications (01/25/2020):  This is the current hospital active medication list Current Facility-Administered Medications  Medication Dose Route Frequency Provider Last Rate Last Admin  . 0.9 %  sodium chloride infusion (Manually program via Guardrails IV Fluids)   Intravenous Once Nita Sells, MD   Stopped at 01/24/20 1021  . 0.9 %  sodium chloride infusion   Intravenous Continuous Nita Sells, MD 100 mL/hr at 01/25/20 0456 New Bag at 01/25/20 0456  . acetaminophen (TYLENOL) tablet 1,000 mg  1,000 mg Oral Q6H Persons, Bevely Palmer, PA   1,000  mg at 01/25/20 1125  . acetaminophen (TYLENOL) tablet 650 mg  650 mg Oral Once Nita Sells, MD      . Ampicillin-Sulbactam (UNASYN) 3 g in sodium chloride 0.9 % 100 mL IVPB  3 g Intravenous Q6H Persons, Bevely Palmer, PA 200 mL/hr  at 01/25/20 1129 3 g at 01/25/20 1129  . Chlorhexidine Gluconate Cloth 2 % PADS 6 each  6 each Topical Daily Persons, Bevely Palmer, [place]   6 each at 01/25/20 1013  . docusate sodium (COLACE) capsule 100 mg  100 mg Oral BID Persons, Bevely Palmer, PA   100 mg at 01/25/20 1010  . gabapentin (NEURONTIN) capsule 300 mg  300 mg Oral TID Persons, Bevely Palmer, PA   300 mg at 01/25/20 1010  . heparin ADULT infusion 100 units/mL (25000 units/2103mL sodium chloride 0.45%)  2,000 Units/hr Intravenous Continuous Nita Sells, MD 20 mL/hr at 01/25/20 0704 2,000 Units/hr at 01/25/20 0704  . HYDROmorphone (DILAUDID) injection 3 mg  3 mg Intravenous Q3H PRN Nita Sells, MD   3 mg at 01/25/20 1125  . lidocaine (XYLOCAINE) 1 % (with pres) injection    PRN Soyla Dryer R, NP   10 mL at 01/25/20 0920  . methocarbamol (ROBAXIN) tablet 1,000 mg  1,000 mg Oral Q8H Persons, Bevely Palmer, PA   1,000 mg at 01/25/20 1302   Or  . methocarbamol (ROBAXIN) 1,000 mg in dextrose 5 % 100 mL IVPB  1,000 mg Intravenous Q8H Persons, Bevely Palmer, PA      . metoprolol tartrate (LOPRESSOR) tablet 12.5 mg  12.5 mg Oral BID Nita Sells, MD   12.5 mg at 01/25/20 1010  . ondansetron (ZOFRAN) tablet 4 mg  4 mg Oral Q6H PRN Persons, Bevely Palmer, PA       Or  . ondansetron [facility]) injection 4 mg  4 mg Intravenous Q6H PRN Persons, Bevely Palmer, PA   4 mg at 01/20/20 0106  . oxyCODONE (Oxy IR/ROXICODONE) immediate release tablet 20 mg  20 mg Oral Q3H PRN Nita Sells, MD   20 mg at 01/25/20 1347  . polyethylene glycol (MIRALAX / GLYCOLAX) packet 17 g  17 g Oral Daily PRN Persons, Bevely Palmer, PA      . potassium chloride SA (KLOR-CON) CR tablet 40 mEq  40 mEq Oral Daily Nita Sells, MD   40 mEq at 01/25/20 1015  . sodium chloride flush (NS) 0.9 % injection 20 mL  20 mL Intracatheter Q12H [place], Jamie R, NP   20 mL at 01/25/20 1013  . zinc oxide (BALMEX) 11.3 % cream   Topical PRN Persons, Bevely Palmer, [place]   Given at  01/18/20 2242     Discharge Medications: Please see discharge summary for a list of discharge medications.  Relevant Imaging Results:  Relevant Lab Results:   Additional Information [phone]  Trula Ore, LCSWA

## 2020-01-25 NOTE — Progress Notes (Addendum)
[place] for Heparin Indication: pulmonary embolus  Allergies  Allergen Reactions  . Ivp Dye [Iodinated Diagnostic Agents] Anaphylaxis    Can be pre-treated with Benadryl  . Metrizamide Anaphylaxis  . Other Other (See Comments)    Under no circumstances will the patient agree to a PICC line  . Reglan [Metoclopramide] Anaphylaxis  . Shellfish-Derived Products Anaphylaxis  . Codeine Hives  . Lidocaine Other (See Comments) and Rash    Pt not sure if this is an actual allergy - might have been a one time incident  . Methadone Hives  . Morphine Swelling and Rash    Local reaction to IV being pushed too fast  . Propoxyphene Hives  . Toradol [Ketorolac Tromethamine] Itching    Patient having systemic itching after receiving IV dose  . Iodine Rash    Reports localized reaction at IV site. Able to tolerate with Benadryl.   . Tape Rash  . Vancomycin Rash    Has had vancomycin since this reaction and had no reaction at all    Patient Measurements: Height: 5\' 10"  (177.8 cm) Weight: 83.3 kg (183 lb 10.3 oz) IBW/kg (Calculated) : 73 Heparin Dosing Weight: 79.4 kg  Vital Signs: Temp: 99.3 F (37.4 C) (11/10 0300) Temp Source: Oral (11/10 0300) BP: 147/75 (11/10 0300) Pulse Rate: 103 (11/10 0300)  Labs: Recent Labs    01/23/20 0544 01/23/20 1400 01/23/20 2200 01/24/20 0339 01/24/20 1009 01/24/20 2008 01/25/20 0506  HGB 7.4*  --   --  7.0*  --   --  7.1*  HCT 23.1*  --   --  22.0*  --   --  22.2*  PLT 252  --   --  268  --   --  284  HEPARINUNFRC 0.21*   < >   < >  --  0.29* 0.33 0.23*  CREATININE  --   --   --   --   --   --  0.84   < > = values in this interval not displayed.    Estimated Creatinine Clearance: 107.4 mL/min (by C-G formula based on SCr of 0.84 mg/dL).   Medical History: Past Medical History:  Diagnosis Date  . Allergy   . Anxiety   . Arthritis    left shoulder  . Bronchitis   . Chest pain 07/14/2016  .  Complication of anesthesia   . DJD (degenerative joint disease) 11/29/2011  . Hypertension   . Neuromuscular disorder ([place])    carpal tunnel bilateral, ulner nerve surgery  . PONV (postoperative nausea and vomiting)   . Septic arthritis of knee, right ([place]) 11/29/2011  . Spinal headache    "long time ago"   Assessment: 54 yr old male POD#2 I&D L knee and L anterior thigh abscess. Pharmacy consulted 10/30 to switch IV heparin to Lovenox for possible pulmonary embolism due to difficult IV access, need for central line placement and CTA.   10/31: CTA showed bilateral non-occlusive PE. Pharmacy now consulted to switch back to heparin with plans to return to OR for repeat I&D.   Heparin infusion up to 2000 units/hr and after one therapeutic level, heparin level slightly subtherapeutic at 0.23 today. Hgb down to 7.1 despite 1 PRBC yesterday, plt 284. Upon talking with nursing, pt just had a bloody episode in wound VAC drainage - MD aware, heparin still going, now has stabilized.  Goal of Therapy:   Heparin level 0.3-0.7 units/mL Monitor platelets by anticoagulation protocol: Yes  Plan:  Given bleeding, will continue heparin at 2000 units/hr and recheck level and bleeding status in 6 hours prior to rate adjustment Monitor daily HL, CBC, and for s/sx of bleeding   Antonietta Jewel, PharmD, [place] Pharmacist  Phone: [phone] 01/25/2020 7:51 AM  Please check AMION for all [place] phone numbers After 10:00 PM, call [place] [phone]  ADDENDUM  Heparin level came back elevated at 1.68 (was accidentally drawn while heparin infusing) - discussed with RN, lab has tried for peripheral sticks without luck. RN redrew level and came back therapeutic at 0.32, on 2000 units/hr. No new bleeding or infusion issues. Will continue at same rate of 2000 units/hr and get level with AM labs.   Antonietta Jewel, PharmD, [place] Clinical Pharmacist

## 2020-01-26 ENCOUNTER — Other Ambulatory Visit: Payer: Self-pay | Admitting: Physician Assistant

## 2020-01-26 DIAGNOSIS — M009 Pyogenic arthritis, unspecified: Secondary | ICD-10-CM | POA: Diagnosis not present

## 2020-01-26 DIAGNOSIS — R778 Other specified abnormalities of plasma proteins: Secondary | ICD-10-CM | POA: Diagnosis not present

## 2020-01-26 DIAGNOSIS — I2699 Other pulmonary embolism without acute cor pulmonale: Secondary | ICD-10-CM | POA: Diagnosis not present

## 2020-01-26 DIAGNOSIS — I10 Essential (primary) hypertension: Secondary | ICD-10-CM | POA: Diagnosis not present

## 2020-01-26 LAB — TYPE AND SCREEN
ABO/RH(D): O NEG
Antibody Screen: NEGATIVE
Unit division: 0
Unit division: 0

## 2020-01-26 LAB — CBC
HCT: 27.5 % — ABNORMAL LOW (ref 39.0–52.0)
Hemoglobin: 8.4 g/dL — ABNORMAL LOW (ref 13.0–17.0)
MCH: 26.5 pg (ref 26.0–34.0)
MCHC: 30.5 g/dL (ref 30.0–36.0)
MCV: 86.8 fL (ref 80.0–100.0)
Platelets: 279 10*3/uL (ref 150–400)
RBC: 3.17 MIL/uL — ABNORMAL LOW (ref 4.22–5.81)
RDW: 18.5 % — ABNORMAL HIGH (ref 11.5–15.5)
WBC: 10.9 10*3/uL — ABNORMAL HIGH (ref 4.0–10.5)
nRBC: 0.2 % (ref 0.0–0.2)

## 2020-01-26 LAB — BPAM RBC
Blood Product Expiration Date: 202111262359
Blood Product Expiration Date: 202111302359
ISSUE DATE / TIME: 202111090958
ISSUE DATE / TIME: 202111101717
Unit Type and Rh: 9500
Unit Type and Rh: 9500

## 2020-01-26 LAB — COMPREHENSIVE METABOLIC PANEL
ALT: 10 U/L (ref 0–44)
AST: 15 U/L (ref 15–41)
Albumin: 2.2 g/dL — ABNORMAL LOW (ref 3.5–5.0)
Alkaline Phosphatase: 93 U/L (ref 38–126)
Anion gap: 11 (ref 5–15)
BUN: 5 mg/dL — ABNORMAL LOW (ref 6–20)
CO2: 23 mmol/L (ref 22–32)
Calcium: 8.5 mg/dL — ABNORMAL LOW (ref 8.9–10.3)
Chloride: 109 mmol/L (ref 98–111)
Creatinine, Ser: 0.71 mg/dL (ref 0.61–1.24)
GFR, Estimated: >60 mL/min (ref >60)
Glucose, Bld: 114 mg/dL — ABNORMAL HIGH (ref 70–99)
Potassium: 4 mmol/L (ref 3.5–5.1)
Sodium: 143 mmol/L (ref 135–145)
Total Bilirubin: 0.7 mg/dL (ref 0.3–1.2)
Total Protein: 5.8 g/dL — ABNORMAL LOW (ref 6.5–8.1)

## 2020-01-26 LAB — HEPARIN LEVEL (UNFRACTIONATED)
Heparin Unfractionated: 0.51 IU/mL (ref 0.30–0.70)
Heparin Unfractionated: 0.26 IU/mL — ABNORMAL LOW (ref 0.30–0.70)

## 2020-01-26 NOTE — Progress Notes (Signed)
Patient c/o increasing  left knee pain ,stated L knee is  throbbing  and feel tight at  this time. Pedal pulses palpable,   L knee dsg dry & intact , pain med given Dr Marlowe Sax notified ( on call MD )No new orders given, Labs due this am & to call with abn results, will  continue to monitor pt.

## 2020-01-26 NOTE — Progress Notes (Signed)
Jason [name], Jason [name]   Brief Narrative: GRIFFIN GERRARD is a 54 y.o. male with medical history significant of  Sp AKA, septir arthritis of left knee, h/o opioid dependence,documented Munchausen syndrome. [name] presented secondary to LOC, chest pain and knee pain. Syncope workup in addition to management of septic arthritis.   Assessment & Plan:   Principal Problem:   Bilateral pulmonary embolism (HCC) Active Problems:   Essential hypertension   S/P AKA (above knee amputation) unilateral, right (HCC)   Other chronic pain   Septic arthritis (HCC)   Syncope   Elevated troponin   Chest pain   Bright red blood [name] rectum   Anemia   Encounter for central line placement   Osteomyelitis of left knee region [facility])   Chest pain Present on admission and has improved. Cardiology consulted. HS troponin elevated: 77>93>68. Elevated D-dimer of 9.72 and Transthoracic Echocardiogram significant for RV dilation with concern for possible PE. Jason chest pain currently. On room air. Negative venous duplex for DVT. PE confirmed on CTA chest. Chest pain improved with treatment of PEs.  Bilateral PEs Submassive PE Non-occlusive on CTA. Jason heart strain [name] CT but initial Transthoracic Echocardiogram with moderate RV dilation and reduced function. Repeat Transthoracic Echocardiogram significant for improved RV. Pulmonology consulted and recommended to continue IV heparin with eventual transition to NOAC. -Continue Heparin IV (dose reduced today); may need to consider IV filter if unable to control bleeding.  Syncope and collapse Likely secondary to PE.  -Management above  Septic arthritis Recurring issue. [name] chart review, [name] was [facility] with concern [name] may have Munchausen and is possibly inoculating himself. Previous cultures (10/3) from OSH significant for significant  for strep mitis/oralis/anginosus/parasnguinis with blood culture significant for strep anginosus/strepp salivarius/gemella haemolysans (as mentioned in H&P) with susceptibilities to penicillin. Prevotella denticola isolated on this admission. Orthopedic surgery consulted and [name] underwent I&D on 10/28 in the OR with vancomycin/tobramycin cement beads placed. Arthrotomy and excisional debridement performed on 11/3 with vancomycin/gentamicin stimulant antibiotic bead placement. Started on Unasyn IV [name] ID -ID recommendations: Unasyn IV x6 weeks with end date of 02/29/2020 -Orthopedic surgery recommendations: plan for repeat I&D on 11/12  Bright red blood [name] rectum FOBT negative x1. Hemoglobin tended down slightly from admission. Jason recurrence.  Acute on chronic Anemia Likely related to large and ongoing hematoma of left knee. Baseline appears to be between 8-9. Worsened hemoglobin with a low of 6.5 on 10/29. [name] has received about 5.5 units of PRBC to date. [name] with large hematoma of wound found on 11/10 which is likely contributing to continued anemia. Complicated by heparin drip -Daily CBC  Elevated temperature Nursing obtained temperature with an elevated reading of 101.5 F. Repeat of 98.6 F without Tylenol   DVT prophylaxis: SCDs Code Status:   Code Status: Full Code Family Communication: None at bedside Disposition Plan: Discharge to SNF pending transition to NOAC once hemoglobin is stable.   Consultants:   Orthopedic surgery  Cardiology  Pulmonology  Infectious  disease  Procedures:   INCISION & DRAINAGE (10/28) 1. Incision and drainage of left septic knee 2. Incision and drainage of left anterior thigh abscess 3. Placement of antibiotic cement  4. Incisional wound vac placement   TRANSTHORACIC ECHOCARDIOGRAM (10/29) IMPRESSIONS    1. The RV is severely dilated with moderately reduced function. Would  consider acute PE to explain tachycardia, syncope,  and elevated troponin.  2. Left ventricular ejection fraction, by estimation, is 60 to 65%. The  left ventricle has normal function. The left ventricle has Jason regional  wall motion abnormalities. Left ventricular diastolic parameters were  normal. There is the interventricular  septum is flattened in systole, consistent with right ventricular pressure  overload.  3. Right ventricular systolic function is moderately reduced. The right  ventricular size is severely enlarged.  4. The mitral valve is grossly normal. Trivial mitral valve  regurgitation. Jason evidence of mitral stenosis.  5. The aortic valve is tricuspid. Aortic valve regurgitation is not  visualized. Jason aortic stenosis is present.   Conclusion(s)/Recommendation(s): Findings consistent with Cor Pulmonale.   TRANSTHORACIC ECHOCARDIOGRAM (10/31) IMPRESSIONS    1. Left ventricular ejection fraction, by estimation, is 60 to 65%. The  left ventricle has normal function. The left ventricle has Jason regional  wall motion abnormalities.  2. Right ventricular systolic function is mildly reduced. The right  ventricular size is mildly enlarged.   Comparison(s): The right ventricular systolic function has improved.   IRRIGATION AND DEBRIDEMENT (11/3)  Antimicrobials:  Unasyn   Subjective: Knee pain. [name] requesting for increase in Dilaudid IV. States he cannot take any NSAIDs because they "tear his stomach up."   Objective: Vitals:   01/26/20 0817 01/26/20 0819 01/26/20 0913 01/26/20 1137  BP: (!) 126/57 (!) 147/85  (!) 147/85  Pulse: 94 94    Resp: 14 14    Temp: (!) 101.5 F (38.6 C) 99 F (37.2 C) 98.6 F (37 C) 99 F (37.2 C)  TempSrc: Oral   Oral  SpO2: 94%     Weight:      Height:        Intake/Output Summary (Last 24 hours) at 01/26/2020 1226 Last data filed at 01/26/2020 0539 Gross [name] 24 hour  Intake 345 ml  Output 1900 ml  Net -1555 ml   Filed Weights   01/22/20 0553 01/23/20 0458  01/24/20 0334  Weight: 83.3 kg 84 kg 83.3 kg    Examination:  General exam: Appears calm and comfortable Respiratory system: Clear to auscultation. Respiratory effort normal. Cardiovascular system: S1 & S2 heard, RRR. Jason murmurs, rubs, gallops or clicks. Gastrointestinal system: Abdomen is nondistended, soft and nontender. Jason organomegaly or masses felt. Normal bowel sounds heard. Central nervous system: Alert and oriented. Jason focal neurological deficits. Musculoskeletal: Left knee with ACE bandages. Some swelling of LLE. Jason calf tenderness Skin: Jason cyanosis. Jason rashes Psychiatry: Judgement and insight appear normal. Mood & affect appropriate.      Data Reviewed: I have personally reviewed following labs and imaging studies  CBC Lab Results  Component Value Date   WBC 10.9 (H) 01/26/2020   RBC 3.17 (L) 01/26/2020   HGB 8.4 (L) 01/26/2020   HCT 27.5 (L) 01/26/2020   MCV 86.8 01/26/2020   MCH 26.5 01/26/2020   PLT 279 01/26/2020   MCHC 30.5 01/26/2020   RDW 18.5 (H) 01/26/2020   LYMPHSABS 0.5 (L) 01/21/2020   MONOABS 0.2 01/21/2020   EOSABS 0.0 01/21/2020   BASOSABS 0.0 05/39/7673     Last metabolic panel Lab Results  Component Value Date   NA 143 01/26/2020   K 4.0 01/26/2020   CL 109 01/26/2020   CO2 23 01/26/2020   BUN 5 (L) 01/26/2020   CREATININE 0.71 01/26/2020   GLUCOSE 114 (H) 01/26/2020   GFRNONAA >60 01/26/2020   GFRAA >60 08/18/2018   CALCIUM 8.5 (L) 01/26/2020   PHOS 3.7  01/18/2020   PROT 5.8 (L) 01/26/2020   ALBUMIN 2.2 (L) 01/26/2020   LABGLOB 2.6 08/14/2017   AGRATIO 1.9 08/14/2017   BILITOT 0.7 01/26/2020   ALKPHOS 93 01/26/2020   AST 15 01/26/2020   ALT 10 01/26/2020   ANIONGAP 11 01/26/2020    CBG (last 3)  Jason results for input(s): GLUCAP in the last 72 hours.   GFR: Estimated Creatinine Clearance: 112.8 mL/min (by C-G formula based on SCr of 0.71 mg/dL).  Coagulation Profile: Jason results for input(s): INR, PROTIME in the last 168  hours.  Recent Results (from the past 240 hour(s))  Aerobic/Anaerobic Culture (surgical/deep wound)     Status: None   Collection Time: 01/18/20  4:18 PM   Specimen: Soft Tissue, Other  Result Value Ref Range Status   Specimen Description TISSUE  Final   Special Requests IRRIGATION AND DEBRIDMENT SEPTIC LEFT KNEE  Final   Gram Stain   Final    RARE WBC PRESENT,BOTH PMN AND MONONUCLEAR Jason ORGANISMS SEEN    Culture   Final    Jason growth aerobically or anaerobically. Performed at [facility], [place] [address] [place], [place] [address]    Report Status 01/23/2020 FINAL  Final        Radiology Studies: [facility] PLACEMENT LEFT >5 YRS INC IMG GUIDE  Result Date: 01/25/2020 INDICATION: [name] with osteomyelitis requiring 6 weeks of intravenous antibiotics. [name] has poor venous access, interventional radiology asked to place a PICC. EXAM: ULTRASOUND AND FLUOROSCOPIC GUIDED PICC LINE INSERTION MEDICATIONS: 1% lidocaine 2 mL CONTRAST:  None FLUOROSCOPY TIME:  36 seconds (2 mGy) COMPLICATIONS: None immediate. TECHNIQUE: The procedure, risks, benefits, and alternatives were explained to the [name] and informed written consent was obtained. The left upper extremity was prepped with chlorhexidine in a sterile fashion, and a sterile drape was applied covering the operative field. Maximum barrier sterile technique with sterile gowns and gloves were used for the procedure. A timeout was performed prior to the initiation of the procedure. Local anesthesia was provided with 1% lidocaine. After the overlying soft tissues were anesthetized with 1% lidocaine, a micropuncture kit was utilized to access the left basilic vein. Real-time ultrasound guidance was utilized for vascular access including the acquisition of a permanent ultrasound image documenting patency of the accessed vessel. A guidewire was advanced to the level of the superior caval-atrial junction for measurement purposes and the PICC line  was cut to length. A peel-away sheath was placed and a 38 cm, 5 [place], dual lumen was inserted to level of the superior caval-atrial junction. A post procedure spot fluoroscopic was obtained. The catheter easily aspirated and flushed and was secured in place. A dressing was placed. The [name] tolerated the procedure well without immediate post procedural complication. FINDINGS: After catheter placement, the tip lies within the superior cavoatrial junction. The catheter aspirates and flushes normally and is ready for immediate use. IMPRESSION: Successful ultrasound and fluoroscopic guided placement of a left basilic vein approach, 38 cm, 5 French, dual lumen PICC with tip at the superior caval-atrial junction. The PICC line is ready for immediate use. Read by: Soyla Dryer, NP Electronically Signed   By: Aletta Edouard M.D.   On: 01/25/2020 10:07        Scheduled Meds: . sodium chloride   Intravenous Once  . sodium chloride   Intravenous Once  . acetaminophen  1,000 mg Oral Q6H  . acetaminophen  650 mg Oral Once  . Chlorhexidine Gluconate Cloth  6 each  Topical Daily  . docusate sodium  100 mg Oral BID  . gabapentin  300 mg Oral TID  . methocarbamol  1,000 mg Oral Q8H  . metoprolol tartrate  12.5 mg Oral BID  . potassium chloride  40 mEq Oral Daily  . sodium chloride flush  20 mL Intracatheter Q12H   Continuous Infusions: . sodium chloride 100 mL/hr at 01/26/20 0601  . ampicillin-sulbactam (UNASYN) IV 3 g (01/26/20 0826)  . heparin 1,900 Units/hr (01/26/20 1043)  . methocarbamol (ROBAXIN) IV       LOS: 15 days     Cordelia Poche, MD [facility] 01/26/2020, 12:26 PM  If 7PM-7AM, please contact night-coverage [email or web address]

## 2020-01-26 NOTE — Care Management Important Message (Signed)
Important Message  Patient Details  Name: QUINTEN ALLERTON MRN: [id number] Date of Birth: 1966/01/10   Medicare Important Message Given:  Yes     [name] 01/26/2020, 11:24 AM

## 2020-01-26 NOTE — TOC Progression Note (Addendum)
Transition of Care [facility]) - Progression Note    Patient Details  Name: Jason [name] MRN: [id number] Date of Birth: October 20, 1965  Transition of Care [facility]) CM/SW [place], [place] Phone Number: 01/26/2020, 2:23 PM  Clinical Narrative:     CSW spoke with patient at bedside to provide SNF bed offers. Patient would like for CSW to come back closer to patient being medically ready. Patient told CSW he will be here a couple more days. CSW let patient know she will stop back by closer to patient being medically ready.Patient thanked CSW.  Pending SNF choice. When SNF bed offers are provided make sure to include [facility] located in [place]. CSW had to fax referral over. They cannot see anything through hub.  CSW will continue to follow.  Expected Discharge Plan: [place] Barriers to Discharge: Continued Medical Work up  Expected Discharge Plan and Services Expected Discharge Plan: [place]   Discharge Planning Services: CM Consult Post Acute Care Choice: Home Health (has wheelchair) Living arrangements for the past 2 months: [place]: PT, OT [place] Agency: [place] Date [place]: 01/16/20 Time [place]: 1052 Representative spoke with at [place]: Adela Lank   Social Determinants of Health ([place]) Interventions    Readmission Risk Interventions No flowsheet data found.

## 2020-01-26 NOTE — Progress Notes (Signed)
PT Cancellation Note  Patient Details Name: Jason [name] MRN: [id number] DOB: 1965-06-27   Cancelled Treatment:    Reason Eval/Treat Not Completed: Other (comment). Pt now with hematoma lt knee and to go back to OR tomorrow. Offered to bring pt theraband for UE strengthening but pt declined and said he was working his arms by moving them around. Will continue to follow and monitor pt after surgery.    Shary Decamp [facility] 01/26/2020, 12:17 PM [place] Pager [phone] Office [phone]

## 2020-01-26 NOTE — Progress Notes (Signed)
   01/26/20 0817  Vitals  Temp (!) 101.5 F (38.6 C)  Temp Source Oral  BP (!) 126/57  MAP (mmHg) 78  BP Location Right Arm  BP Method Automatic  Patient Position (if appropriate) Lying  Pulse Rate 94  Pulse Rate Source Monitor  ECG Heart Rate 83  Resp 14  Level of Consciousness  Level of Consciousness Alert  MEWS COLOR  MEWS Score Color Yellow  Oxygen Therapy  SpO2 94 %  O2 Device Room Air  Patient Activity (if Appropriate) In bed  Pain Assessment  Pain Scale 0-10  Pain Score 8  Pain Type Acute pain  Pain Location Knee  Pain Orientation Left  Pain Radiating Towards  (Radiates up left leg and left thigh)  Pain Descriptors / Indicators Pressure  Pain Frequency Constant  Pain Onset On-going  Patients Stated Pain Goal 3  Pain Intervention(s) Medication (See eMAR)  Multiple Pain Sites No  MEWS Score  MEWS Temp 2  MEWS Systolic 0  MEWS Pulse 0  MEWS RR 0  MEWS LOC 0  MEWS Score 2  temperature 101.5 this AM, MD R.  Netty made aware, rechecked temp at 0913 went down to 98.6. Will continue to monitor.

## 2020-01-26 NOTE — Progress Notes (Addendum)
[place] for Heparin Indication: pulmonary embolus  Allergies  Allergen Reactions  . Ivp Dye [Iodinated Diagnostic Agents] Anaphylaxis    Can be pre-treated with Benadryl  . Metrizamide Anaphylaxis  . Other Other (See Comments)    Under no circumstances will the patient agree to a PICC line  . Reglan [Metoclopramide] Anaphylaxis  . Shellfish-Derived Products Anaphylaxis  . Codeine Hives  . Lidocaine Other (See Comments) and Rash    Pt not sure if this is an actual allergy - might have been a one time incident  . Methadone Hives  . Morphine Swelling and Rash    Local reaction to IV being pushed too fast  . Propoxyphene Hives  . Toradol [Ketorolac Tromethamine] Itching    Patient having systemic itching after receiving IV dose  . Iodine Rash    Reports localized reaction at IV site. Able to tolerate with Benadryl.   . Tape Rash  . Vancomycin Rash    Has had vancomycin since this reaction and had no reaction at all    Patient Measurements: Height: 5\' 10"  (177.8 cm) Weight: 83.3 kg (183 lb 10.3 oz) IBW/kg (Calculated) : 73 Heparin Dosing Weight: 79.4 kg  Vital Signs: Temp: 98 F (36.7 C) (11/11 0539) Temp Source: Oral (11/11 0539) BP: 164/81 (11/11 0539) Pulse Rate: 91 (11/10 2151)  Labs: Recent Labs    01/24/20 0339 01/24/20 1009 01/25/20 0506 01/25/20 0506 01/25/20 1058 01/25/20 1144 01/25/20 1516 01/26/20 0406  HGB 7.0*  --  7.1*   < >  --   --  6.8* 8.4*  HCT 22.0*  --  22.2*  --   --   --  21.2* 27.5*  PLT 268  --  284  --   --   --   --  279  HEPARINUNFRC  --    < > 0.23*   < > 1.68* 0.32  --  0.51  CREATININE  --   --  0.84  --   --   --   --  0.71   < > = values in this interval not displayed.    Estimated Creatinine Clearance: 112.8 mL/min (by C-G formula based on SCr of 0.71 mg/dL).   Medical History: Past Medical History:  Diagnosis Date  . Allergy   . Anxiety   . Arthritis    left shoulder  .  Bronchitis   . Chest pain 07/14/2016  . Complication of anesthesia   . DJD (degenerative joint disease) 11/29/2011  . Hypertension   . Neuromuscular disorder ([place])    carpal tunnel bilateral, ulner nerve surgery  . PONV (postoperative nausea and vomiting)   . Septic arthritis of knee, right ([place]) 11/29/2011  . Spinal headache    "long time ago"   Assessment: 54 yr old male POD#2 I&D L knee and L anterior thigh abscess. Pharmacy consulted 10/30 to switch IV heparin to Lovenox for possible pulmonary embolism due to difficult IV access, need for central line placement and CTA.   10/31: CTA showed bilateral non-occlusive PE. Pharmacy now consulted to switch back to heparin with plans to return to OR for repeat I&D.   Heparin infusion up to 2000 units/hr and heparin level is therapeutic at 0.51. Hgb stable at 8.4, plt 279- after 1 PRBC 11/10. No infusion issues.  No new bleeding noted by nursing - hematoma is expanding in size, will watch closely.   Goal of Therapy:   Heparin level 0.3-0.5 units/mL Monitor platelets by  anticoagulation protocol: Yes   Plan:  Reduce heparin to 1900 units/hr given expanding hematoma Monitor daily HL, CBC, and for s/sx of bleeding   Antonietta Jewel, PharmD, [place] Pharmacist  Phone: [phone] 01/26/2020 7:29 AM  Please check AMION for all [place] phone numbers After 10:00 PM, call [place] [phone]  ADDENDUM Heparin level came back subtherapeutic at 0.26, on 1900 units/hr. Discussed with MD and given significant bleeding from L knee, okay with not adjusting rate and continuing at 1900 units/hr until able to go for I&D tomorrow.   Antonietta Jewel, PharmD, [place] Clinical Pharmacist

## 2020-01-26 NOTE — H&P (View-Only) (Signed)
Patient ID: Jason [name], male   DOB: May 08, 1965, 54 y.o.   MRN: [id number] Patient with significant bleeding from the left knee secondary to anticoagulation therapy.  Patient with a very swollen knee will plan for repeat irrigation and debridement on Friday.  With the large hematoma patient is at increased risk of infection.  We will plan to replace antibiotic beads.

## 2020-01-26 NOTE — Progress Notes (Signed)
Patient ID: Jason [name], male   DOB: 01/30/1966, 54 y.o.   MRN: [id number] Patient with significant bleeding from the left knee secondary to anticoagulation therapy.  Patient with a very swollen knee will plan for repeat irrigation and debridement on Friday.  With the large hematoma patient is at increased risk of infection.  We will plan to replace antibiotic beads.

## 2020-01-27 ENCOUNTER — Encounter (HOSPITAL_COMMUNITY)
Admission: EM | Disposition: A | Discharge status: Skilled Nursing Facility | Payer: Self-pay | Source: Home / Self Care | Attending: Family Medicine

## 2020-01-27 ENCOUNTER — Inpatient Hospital Stay (HOSPITAL_COMMUNITY): Payer: Medicare Other | Admitting: Anesthesiology

## 2020-01-27 ENCOUNTER — Encounter (HOSPITAL_COMMUNITY): Payer: Self-pay | Admitting: Internal Medicine

## 2020-01-27 DIAGNOSIS — M869 Osteomyelitis, unspecified: Secondary | ICD-10-CM | POA: Diagnosis not present

## 2020-01-27 HISTORY — PX: I & D EXTREMITY: SHX5045

## 2020-01-27 LAB — COMPREHENSIVE METABOLIC PANEL
ALT: 10 U/L (ref 0–44)
AST: 14 U/L — ABNORMAL LOW (ref 15–41)
Albumin: 2.1 g/dL — ABNORMAL LOW (ref 3.5–5.0)
Alkaline Phosphatase: 101 U/L (ref 38–126)
Anion gap: 6 (ref 5–15)
BUN: <5 mg/dL — ABNORMAL LOW (ref 6–20)
CO2: 27 mmol/L (ref 22–32)
Calcium: 8.4 mg/dL — ABNORMAL LOW (ref 8.9–10.3)
Chloride: 105 mmol/L (ref 98–111)
Creatinine, Ser: 0.78 mg/dL (ref 0.61–1.24)
GFR, Estimated: >60 mL/min (ref >60)
Glucose, Bld: 137 mg/dL — ABNORMAL HIGH (ref 70–99)
Potassium: 3.9 mmol/L (ref 3.5–5.1)
Sodium: 138 mmol/L (ref 135–145)
Total Bilirubin: 0.7 mg/dL (ref 0.3–1.2)
Total Protein: 5.7 g/dL — ABNORMAL LOW (ref 6.5–8.1)

## 2020-01-27 LAB — CBC
HCT: 25.9 % — ABNORMAL LOW (ref 39.0–52.0)
Hemoglobin: 8.1 g/dL — ABNORMAL LOW (ref 13.0–17.0)
MCH: 27.2 pg (ref 26.0–34.0)
MCHC: 31.3 g/dL (ref 30.0–36.0)
MCV: 86.9 fL (ref 80.0–100.0)
Platelets: 313 10*3/uL (ref 150–400)
RBC: 2.98 MIL/uL — ABNORMAL LOW (ref 4.22–5.81)
RDW: 18.4 % — ABNORMAL HIGH (ref 11.5–15.5)
WBC: 12.2 10*3/uL — ABNORMAL HIGH (ref 4.0–10.5)
nRBC: 0.2 % (ref 0.0–0.2)

## 2020-01-27 LAB — HEPARIN LEVEL (UNFRACTIONATED): Heparin Unfractionated: 0.27 IU/mL — ABNORMAL LOW (ref 0.30–0.70)

## 2020-01-27 SURGERY — IRRIGATION AND DEBRIDEMENT EXTREMITY
Anesthesia: General | Site: Knee | Laterality: Left

## 2020-01-27 MED ORDER — SODIUM CHLORIDE 0.9% FLUSH: 10.0000 mL | INTRAVENOUS | Status: DC | PRN

## 2020-01-27 MED ORDER — MIDAZOLAM HCL 2 MG/2ML IJ SOLN
INTRAMUSCULAR | Status: AC
  Filled 2020-01-27: qty 2

## 2020-01-27 MED ORDER — PHENYLEPHRINE 40 MCG/ML (10ML) SYRINGE FOR IV PUSH (FOR BLOOD PRESSURE SUPPORT)
PREFILLED_SYRINGE | INTRAVENOUS | Status: AC
  Filled 2020-01-27: qty 10

## 2020-01-27 MED ORDER — CHLORHEXIDINE GLUCONATE 0.12 % MT SOLN
OROMUCOSAL | Status: AC
  Administered 2020-01-27: 15 mL
  Filled 2020-01-27: qty 15

## 2020-01-27 MED ORDER — DEXAMETHASONE SODIUM PHOSPHATE 10 MG/ML IJ SOLN
INTRAMUSCULAR | Status: DC | PRN
  Administered 2020-01-27: 10 mg via INTRAVENOUS

## 2020-01-27 MED ORDER — ONDANSETRON HCL 4 MG/2ML IJ SOLN: 4.0000 mg | Freq: Once | INTRAMUSCULAR | Status: DC | PRN

## 2020-01-27 MED ORDER — SODIUM CHLORIDE 0.9% FLUSH
10.0000 mL | Freq: Two times a day (BID) | INTRAVENOUS | Status: DC
  Administered 2020-01-27 – 2020-01-31 (×8): 10 mL
  Administered 2020-01-31: 20 mL
  Administered 2020-02-01: 10 mL

## 2020-01-27 MED ORDER — HYDROMORPHONE HCL 1 MG/ML IJ SOLN
INTRAMUSCULAR | Status: AC
  Filled 2020-01-27: qty 0.5

## 2020-01-27 MED ORDER — MIDAZOLAM HCL 5 MG/5ML IJ SOLN
INTRAMUSCULAR | Status: DC | PRN
  Administered 2020-01-27: 2 mg via INTRAVENOUS

## 2020-01-27 MED ORDER — PROPOFOL 10 MG/ML IV BOLUS
INTRAVENOUS | Status: DC | PRN
  Administered 2020-01-27: 200 mg via INTRAVENOUS

## 2020-01-27 MED ORDER — GENTAMICIN SULFATE 40 MG/ML IJ SOLN
INTRAMUSCULAR | Status: AC
  Filled 2020-01-27: qty 6

## 2020-01-27 MED ORDER — TOBRAMYCIN SULFATE 1.2 G IJ SOLR
INTRAMUSCULAR | Status: AC
  Filled 2020-01-27: qty 1.2

## 2020-01-27 MED ORDER — HEPARIN (PORCINE) 25000 UT/250ML-% IV SOLN
1850.0000 [IU]/h | INTRAVENOUS | Status: DC
  Administered 2020-01-28 (×2): 1900 [IU]/h via INTRAVENOUS
  Administered 2020-01-29: 1800 [IU]/h via INTRAVENOUS
  Administered 2020-01-30: 1900 [IU]/h via INTRAVENOUS
  Administered 2020-01-30: 1800 [IU]/h via INTRAVENOUS
  Administered 2020-01-31: 1850 [IU]/h via INTRAVENOUS
  Filled 2020-01-27 (×6): qty 250

## 2020-01-27 MED ORDER — LACTATED RINGERS IV SOLN: INTRAVENOUS | Status: DC

## 2020-01-27 MED ORDER — CEFAZOLIN SODIUM-DEXTROSE 2-4 GM/100ML-% IV SOLN
2.0000 g | INTRAVENOUS | Status: AC
  Administered 2020-01-27: 2 g via INTRAVENOUS

## 2020-01-27 MED ORDER — SODIUM CHLORIDE 0.9 % IR SOLN
Status: DC | PRN
  Administered 2020-01-27: 3000 mL

## 2020-01-27 MED ORDER — ONDANSETRON HCL 4 MG/2ML IJ SOLN
INTRAMUSCULAR | Status: AC
  Filled 2020-01-27: qty 4

## 2020-01-27 MED ORDER — DEXAMETHASONE SODIUM PHOSPHATE 10 MG/ML IJ SOLN
INTRAMUSCULAR | Status: AC
  Filled 2020-01-27: qty 1

## 2020-01-27 MED ORDER — EPHEDRINE 5 MG/ML INJ
INTRAVENOUS | Status: AC
  Filled 2020-01-27: qty 10

## 2020-01-27 MED ORDER — EPHEDRINE SULFATE-NACL 50-0.9 MG/10ML-% IV SOSY
PREFILLED_SYRINGE | INTRAVENOUS | Status: DC | PRN
  Administered 2020-01-27: 10 mg via INTRAVENOUS
  Administered 2020-01-27: 5 mg via INTRAVENOUS

## 2020-01-27 MED ORDER — FENTANYL CITRATE (PF) 250 MCG/5ML IJ SOLN
INTRAMUSCULAR | Status: AC
  Filled 2020-01-27: qty 5

## 2020-01-27 MED ORDER — 0.9 % SODIUM CHLORIDE (POUR BTL) OPTIME
TOPICAL | Status: DC | PRN
  Administered 2020-01-27: 1000 mL

## 2020-01-27 MED ORDER — SODIUM CHLORIDE 0.9 % IV SOLN: INTRAVENOUS | Status: DC

## 2020-01-27 MED ORDER — ACETAMINOPHEN 160 MG/5ML PO SOLN: 325.0000 mg | ORAL | Status: DC | PRN

## 2020-01-27 MED ORDER — OXYCODONE HCL 5 MG/5ML PO SOLN: 5.0000 mg | Freq: Once | ORAL | Status: DC | PRN

## 2020-01-27 MED ORDER — FENTANYL CITRATE (PF) 100 MCG/2ML IJ SOLN
INTRAMUSCULAR | Status: DC | PRN
  Administered 2020-01-27: 100 ug via INTRAVENOUS
  Administered 2020-01-27 (×3): 50 ug via INTRAVENOUS

## 2020-01-27 MED ORDER — PHENYLEPHRINE 40 MCG/ML (10ML) SYRINGE FOR IV PUSH (FOR BLOOD PRESSURE SUPPORT)
PREFILLED_SYRINGE | INTRAVENOUS | Status: AC
  Filled 2020-01-27: qty 30

## 2020-01-27 MED ORDER — OXYCODONE HCL 5 MG PO TABS: 5.0000 mg | ORAL_TABLET | Freq: Once | ORAL | Status: DC | PRN

## 2020-01-27 MED ORDER — MEPERIDINE HCL 25 MG/ML IJ SOLN: 6.2500 mg | INTRAMUSCULAR | Status: DC | PRN

## 2020-01-27 MED ORDER — CEFAZOLIN SODIUM-DEXTROSE 2-4 GM/100ML-% IV SOLN
INTRAVENOUS | Status: AC
  Filled 2020-01-27: qty 100

## 2020-01-27 MED ORDER — PHENYLEPHRINE 40 MCG/ML (10ML) SYRINGE FOR IV PUSH (FOR BLOOD PRESSURE SUPPORT)
PREFILLED_SYRINGE | INTRAVENOUS | Status: DC | PRN
  Administered 2020-01-27 (×3): 120 ug via INTRAVENOUS
  Administered 2020-01-27 (×2): 80 ug via INTRAVENOUS

## 2020-01-27 MED ORDER — HYDROMORPHONE HCL 1 MG/ML IJ SOLN
INTRAMUSCULAR | Status: DC | PRN
Start: 2020-01-27 — End: 2020-01-27
  Administered 2020-01-27: .5 mg via INTRAVENOUS

## 2020-01-27 MED ORDER — PROPOFOL 10 MG/ML IV BOLUS
INTRAVENOUS | Status: AC
  Filled 2020-01-27: qty 20

## 2020-01-27 MED ORDER — DIPHENHYDRAMINE HCL 50 MG/ML IJ SOLN
INTRAMUSCULAR | Status: DC | PRN
  Administered 2020-01-27: 25 mg via INTRAVENOUS

## 2020-01-27 MED ORDER — ONDANSETRON HCL 4 MG/2ML IJ SOLN
INTRAMUSCULAR | Status: DC | PRN
  Administered 2020-01-27 (×2): 4 mg via INTRAVENOUS

## 2020-01-27 MED ORDER — ORAL CARE MOUTH RINSE: 15.0000 mL | Freq: Once | OROMUCOSAL | Status: DC

## 2020-01-27 MED ORDER — ACETAMINOPHEN 325 MG PO TABS: 325.0000 mg | ORAL_TABLET | ORAL | Status: DC | PRN

## 2020-01-27 MED ORDER — CHLORHEXIDINE GLUCONATE 0.12 % MT SOLN: 15.0000 mL | Freq: Once | OROMUCOSAL | Status: DC

## 2020-01-27 MED ORDER — FENTANYL CITRATE (PF) 100 MCG/2ML IJ SOLN
INTRAMUSCULAR | Status: AC
  Filled 2020-01-27: qty 2

## 2020-01-27 MED ORDER — FENTANYL CITRATE (PF) 100 MCG/2ML IJ SOLN
25.0000 ug | INTRAMUSCULAR | Status: DC | PRN
  Administered 2020-01-27 (×2): 50 ug via INTRAVENOUS

## 2020-01-27 MED ORDER — HEPARIN (PORCINE) 25000 UT/250ML-% IV SOLN
INTRAVENOUS | Status: AC
  Filled 2020-01-27: qty 250

## 2020-01-27 SURGICAL SUPPLY — 32 items
BLADE SURG 21 STRL SS (BLADE) ×3 IMPLANT
BNDG COHESIVE 6X5 TAN NS LF (GAUZE/BANDAGES/DRESSINGS) ×2 IMPLANT
BNDG COHESIVE 6X5 TAN STRL LF (GAUZE/BANDAGES/DRESSINGS) IMPLANT
BNDG GAUZE ELAST 4 BULKY (GAUZE/BANDAGES/DRESSINGS) ×6 IMPLANT
COVER SURGICAL LIGHT HANDLE (MISCELLANEOUS) ×4 IMPLANT
COVER WAND RF STERILE (DRAPES) IMPLANT
DRAPE INCISE IOBAN 66X45 STRL (DRAPES) ×2 IMPLANT
DRAPE U-SHAPE 47X51 STRL (DRAPES) ×3 IMPLANT
DRSG ADAPTIC 3X8 NADH LF (GAUZE/BANDAGES/DRESSINGS) ×3 IMPLANT
DURAPREP 26ML APPLICATOR (WOUND CARE) ×3 IMPLANT
ELECT REM PT RETURN 9FT ADLT (ELECTROSURGICAL) ×3
ELECTRODE REM PT RTRN 9FT ADLT (ELECTROSURGICAL) IMPLANT
GAUZE SPONGE 4X4 12PLY STRL (GAUZE/BANDAGES/DRESSINGS) ×3 IMPLANT
GLOVE BIOGEL PI IND STRL 9 (GLOVE) ×1 IMPLANT
GLOVE BIOGEL PI INDICATOR 9 (GLOVE) ×2
GLOVE SURG ORTHO 9.0 STRL STRW (GLOVE) ×3 IMPLANT
GOWN STRL REUS W/ TWL XL LVL3 (GOWN DISPOSABLE) ×2 IMPLANT
GOWN STRL REUS W/TWL XL LVL3 (GOWN DISPOSABLE) ×4
HANDPIECE INTERPULSE COAX TIP (DISPOSABLE) ×2
KIT BASIN OR (CUSTOM PROCEDURE TRAY) ×3 IMPLANT
KIT TURNOVER KIT B (KITS) ×3 IMPLANT
MANIFOLD NEPTUNE II (INSTRUMENTS) ×3 IMPLANT
NS IRRIG 1000ML POUR BTL (IV SOLUTION) ×3 IMPLANT
PACK ORTHO EXTREMITY (CUSTOM PROCEDURE TRAY) ×3 IMPLANT
PREVENA RESTOR ARTHOFORM 46X30 (CANNISTER) ×2 IMPLANT
SET HNDPC FAN SPRY TIP SCT (DISPOSABLE) IMPLANT
STOCKINETTE IMPERVIOUS 9X36 MD (GAUZE/BANDAGES/DRESSINGS) ×2 IMPLANT
SUT ETHILON 2 0 PSLX (SUTURE) ×7 IMPLANT
TOWEL GREEN STERILE (TOWEL DISPOSABLE) ×3 IMPLANT
TUBE CONNECTING 12'X1/4 (SUCTIONS) ×1
TUBE CONNECTING 12X1/4 (SUCTIONS) ×2 IMPLANT
YANKAUER SUCT BULB TIP NO VENT (SUCTIONS) ×3 IMPLANT

## 2020-01-27 NOTE — Progress Notes (Signed)
[place] for Heparin Indication: pulmonary embolus  Allergies  Allergen Reactions  . Ivp Dye [Iodinated Diagnostic Agents] Anaphylaxis    Can be pre-treated with Benadryl  . Metrizamide Anaphylaxis  . Other Other (See Comments)    Under no circumstances will the patient agree to a PICC line  . Reglan [Metoclopramide] Anaphylaxis  . Shellfish-Derived Products Anaphylaxis  . Codeine Hives  . Lidocaine Other (See Comments) and Rash    Pt not sure if this is an actual allergy - might have been a one time incident  . Methadone Hives  . Morphine Swelling and Rash    Local reaction to IV being pushed too fast  . Propoxyphene Hives  . Toradol [Ketorolac Tromethamine] Itching    Patient having systemic itching after receiving IV dose  . Iodine Rash    Reports localized reaction at IV site. Able to tolerate with Benadryl.   . Tape Rash  . Vancomycin Rash    Has had vancomycin since this reaction and had no reaction at all    Patient Measurements: Height: 5\' 10"  (177.8 cm) Weight: 83.3 kg (183 lb 10.3 oz) IBW/kg (Calculated) : 73 Heparin Dosing Weight: 79.4 kg  Vital Signs: Temp: 98.1 F (36.7 C) (11/12 1621) Temp Source: Oral (11/12 1621) BP: 138/109 (11/12 1621) Pulse Rate: 82 (11/12 1621)  Labs: Recent Labs    01/25/20 0506 01/25/20 1058 01/25/20 1144 01/25/20 1516 01/26/20 0406 01/26/20 1436 01/27/20 0416  HGB 7.1*  --    < > 6.8* 8.4*  --  8.1*  HCT 22.2*  --   --  21.2* 27.5*  --  25.9*  PLT 284  --   --   --  279  --  313  HEPARINUNFRC 0.23*   < >   < >  --  0.51 0.26* 0.27*  CREATININE 0.84  --   --   --  0.71  --  0.78   < > = values in this interval not displayed.    Estimated Creatinine Clearance: 112.8 mL/min (by C-G formula based on SCr of 0.78 mg/dL).   Medical History: Past Medical History:  Diagnosis Date  . Allergy   . Anxiety   . Arthritis    left shoulder  . Bronchitis   . Chest pain 07/14/2016  .  Complication of anesthesia   . DJD (degenerative joint disease) 11/29/2011  . Hypertension   . Neuromuscular disorder ([place])    carpal tunnel bilateral, ulner nerve surgery  . PONV (postoperative nausea and vomiting)   . Septic arthritis of knee, right ([place]) 11/29/2011  . Spinal headache    "long time ago"   Assessment: 54 yr old male POD#2 I&D L knee and L anterior thigh abscess. Pharmacy consulted 10/30 to switch IV heparin to Lovenox for possible pulmonary embolism due to difficult IV access, need for central line placement and CTA.   10/31: CTA showed bilateral non-occlusive PE. Pharmacy now consulted to switch back to heparin with plans to return to OR for repeat I&D.   Heparin infusion down to 1900 units/hr and heparin level is slightly subtherapeutic at 0.27- discussed with MD and okay to keep at lower range given bleeding. Hgb stable at 8.1, plt 313- after 1 PRBC 11/10. No infusion issues.  Hematoma is expanding in size, will watch closely- plan for I&D today.   PM f/u > is now s/p OR after draining of hematoma.  Heparin drip running upon return from OR - called  Dr. Sharol Given to clarify.  Goal of Therapy:   Heparin level 0.3-0.5 units/mL Monitor platelets by anticoagulation protocol: Yes   Plan:  Per discussion with Dr. Sharol Given, will stop IV heparin drip now and leave off overnight. Resume IV heparin tomorrow AM. Check heparin level 6 hrs after gtt resumes - targeting low end of therapeutic range. Daily heparin level and CBC.  [place] Crane, Roylene Reason, [facility] Clinical Pharmacist  01/27/2020 5:47 PM   [facility] pharmacy phone numbers are listed on [email or web address]

## 2020-01-27 NOTE — Op Note (Signed)
01/27/2020  2:32 PM  PATIENT:  Jason [name]    PRE-OPERATIVE DIAGNOSIS:  Septic Left Knee  POST-OPERATIVE DIAGNOSIS:  Same  PROCEDURE:  EXCISIONAL DEBRIDEMENT LEFT KNEE WITH ANTIBIOTIC BEADS  SURGEON:  Newt Minion, MD  PHYSICIAN ASSISTANT:None ANESTHESIA:   General  PREOPERATIVE INDICATIONS:  Jason [name] is a  54 y.o. male with a diagnosis of Septic Left Knee who failed conservative measures and elected for surgical management.    The risks benefits and alternatives were discussed with the patient preoperatively including but not limited to the risks of infection, bleeding, nerve injury, cardiopulmonary complications, the need for revision surgery, among others, and the patient was willing to proceed.  OPERATIVE IMPLANTS: Worthy Keeler form wound VAC  @ENCIMAGES @  OPERATIVE FINDINGS: Massive hematoma in the knee most likely secondary to the heparin therapy, no abscess.  Cultures were not obtained due to the fact that there were still antibiotic beads within the hematoma.  OPERATIVE PROCEDURE: Patient was brought the operating room and underwent a general anesthetic.  After adequate levels anesthesia were obtained patient's left lower extremity was prepped using DuraPrep draped into a sterile field a timeout was called.  The anterior knee incision was opened carried down through a medial parapatellar retinacular incision.  There was a massive hematoma this was evacuated including the remainder of the antibiotic beads.  Patient had degenerative changes of the retinaculum and a retinacular repair would not be strong enough to hold apposition of the retinaculum with knee flexion.  A Cobb elevator was used to further debride the joint the joint was irrigated with pulsatile lavage all the hematoma was evacuated.  The fascial layer and skin were closed using 2-0 nylon.  A Praveena arthroform wound VAC was applied this had a good suction fit it was overwrapped with Covan.  Patient was  extubated taken the PACU in stable condition.   DISCHARGE PLANNING:  Antibiotic duration: Continue IV antibiotics per infectious disease  Weightbearing: Weightbearing as tolerated with a knee immobilizer.  Patient is to wear the knee immobilizer at all times to allow for healing of the retinacular incision  Pain medication: Opioid pathway  Dressing care/ Wound VAC: Continue wound VAC for 1 week  Ambulatory devices: Crutches or walker  Discharge to: Anticipate discharge to skilled nursing  Follow-up: In the office 1 week post operative.

## 2020-01-27 NOTE — Anesthesia Preprocedure Evaluation (Addendum)
Anesthesia Evaluation  Patient identified by MRN, date of birth, ID band Patient awake    Reviewed: Allergy & Precautions, NPO status , Patient's Chart, lab work & pertinent test results  History of Anesthesia Complications (+) PONV, POST - OP SPINAL HEADACHE and history of anesthetic complications  Airway Mallampati: II  TM Distance: >3 FB Neck ROM: Full    Dental  (+) Teeth Intact, Dental Advisory Given   Pulmonary neg pulmonary ROS,    Pulmonary exam normal        Cardiovascular hypertension, Pt. on medications  Rhythm:Regular Rate:Normal   '21 TTE - EF 60 to 65%. Right ventricular systolic function is mildly reduced. The right ventricular size is mildly enlarged.     Neuro/Psych  Headaches, PSYCHIATRIC DISORDERS Anxiety  Neuromuscular disease (phantom limb pain)    GI/Hepatic negative GI ROS, Neg liver ROS,   Endo/Other  negative endocrine ROS  Renal/GU negative Renal ROS     Musculoskeletal  (+) Arthritis , Osteoarthritis,  Septic arthritis   Abdominal   Peds  Hematology  (+) Blood dyscrasia, anemia ,   Anesthesia Other Findings Covid test negative   Reproductive/Obstetrics                           Anesthesia Physical  Anesthesia Plan  ASA: II  Anesthesia Plan: General   Post-op Pain Management:    Induction: Intravenous  PONV Risk Score and Plan: 3 and Ondansetron, Dexamethasone, Scopolamine patch - Pre-op, Treatment may vary due to age or medical condition and Midazolam  Airway Management Planned: LMA  Additional Equipment: None  Intra-op Plan:   Post-operative Plan: Extubation in OR  Informed Consent: I have reviewed the patients History and Physical, chart, labs and discussed the procedure including the risks, benefits and alternatives for the proposed anesthesia with the patient or authorized representative who has indicated his/her understanding and acceptance.      Dental advisory given  Plan Discussed with: CRNA and Anesthesiologist  Anesthesia Plan Comments: (   )      Anesthesia Quick Evaluation

## 2020-01-27 NOTE — Progress Notes (Addendum)
OT Cancellation Note  Patient Details Name: RAYLEN TANGONAN MRN: [id number] DOB: 24-Feb-1966   Cancelled Treatment:    Reason Eval/Treat Not Completed: Patient at procedure or test/ unavailable.    Daiva Huge Lorraine-COTA/L 01/27/2020, 12:36 PM

## 2020-01-27 NOTE — Progress Notes (Signed)
Dr. Sharol Given said no need to stop heparin.

## 2020-01-27 NOTE — Progress Notes (Signed)
PROGRESS NOTE    Jason [name]  HUT:[id number] DOB: 1966/01/29 DOA: 01/11/2020 PCP: Patient, No Pcp Per   Brief Narrative: Jason [name] is a 54 y.o. male with medical history significant of  Sp AKA, septir arthritis of left knee, h/o opioid dependence,documented Munchausen syndrome. Patient presented secondary to LOC, chest pain and knee pain. Syncope workup in addition to management of septic arthritis.   Assessment & Plan:   Principal Problem:   Bilateral pulmonary embolism (HCC) Active Problems:   Essential hypertension   S/P AKA (above knee amputation) unilateral, right (HCC)   Other chronic pain   Septic arthritis (HCC)   Syncope   Elevated troponin   Chest pain   Bright red blood per rectum   Anemia   Encounter for central line placement   Osteomyelitis of left knee region [facility])   Chest pain Present on admission and has improved. Cardiology consulted. HS troponin elevated: 77>93>68. Elevated D-dimer of 9.72 and Transthoracic Echocardiogram significant for RV dilation with concern for possible PE. No chest pain currently. On room air. Negative venous duplex for DVT. PE confirmed on CTA chest. Chest pain improved with treatment of PEs.  Bilateral PEs Submassive PE Non-occlusive on CTA. No heart strain per CT but initial Transthoracic Echocardiogram with moderate RV dilation and reduced function. Repeat Transthoracic Echocardiogram significant for improved RV. Pulmonology consulted and recommended to continue IV heparin with eventual transition to NOAC. -Continue Heparin IV (dose reduced today); may need to consider IV filter if unable to control bleeding.  Syncope and collapse Likely secondary to PE.  -Management above  Septic arthritis Recurring issue. Per chart review, patient was previously managed at [facility] with concern patient may have Munchausen and is possibly inoculating himself. Previous cultures (10/3) from OSH significant for significant  for strep mitis/oralis/anginosus/parasnguinis with blood culture significant for strep anginosus/strepp salivarius/gemella haemolysans (as mentioned in H&P) with susceptibilities to penicillin. Prevotella denticola isolated on this admission. Orthopedic surgery consulted and patient underwent I&D on 10/28 in the OR with vancomycin/tobramycin cement beads placed. Arthrotomy and excisional debridement performed on 11/3 with vancomycin/gentamicin stimulant antibiotic bead placement. Started on Unasyn IV per ID -ID recommendations: Unasyn IV x6 weeks with end date of 02/29/2020 -Orthopedic surgery recommendations: plan for repeat I&D today,11/12  Bright red blood per rectum FOBT negative x1. Hemoglobin tended down slightly from admission. No recurrence.  Acute on chronic Anemia Likely related to large and ongoing hematoma of left knee. Baseline appears to be between 8-9. Worsened hemoglobin with a low of 6.5 on 10/29. Patient has received about 5.5 units of PRBC to date. Patient with large hematoma of wound found on 11/10 which is likely contributing to continued anemia. Complicated by heparin drip. Hemoglobin stable. -Daily CBC  Elevated temperature Nursing obtained temperature with an elevated reading of 101.5 F. Repeat of 98.6 F without Tylenol. Resolved.   DVT prophylaxis: SCDs Code Status:   Code Status: Full Code Family Communication: None at bedside Disposition Plan: Discharge to SNF pending transition to NOAC once hemoglobin is stable.   Consultants:   Orthopedic surgery  Cardiology  Pulmonology  Infectious  disease  Procedures:   INCISION & DRAINAGE (10/28) 1. Incision and drainage of left septic knee 2. Incision and drainage of left anterior thigh abscess 3. Placement of antibiotic cement  4. Incisional wound vac placement   TRANSTHORACIC ECHOCARDIOGRAM (10/29) IMPRESSIONS    1. The RV is severely dilated with moderately reduced function. Would  consider acute PE  to  explain tachycardia, syncope, and elevated troponin.  2. Left ventricular ejection fraction, by estimation, is 60 to 65%. The  left ventricle has normal function. The left ventricle has no regional  wall motion abnormalities. Left ventricular diastolic parameters were  normal. There is the interventricular  septum is flattened in systole, consistent with right ventricular pressure  overload.  3. Right ventricular systolic function is moderately reduced. The right  ventricular size is severely enlarged.  4. The mitral valve is grossly normal. Trivial mitral valve  regurgitation. No evidence of mitral stenosis.  5. The aortic valve is tricuspid. Aortic valve regurgitation is not  visualized. No aortic stenosis is present.   Conclusion(s)/Recommendation(s): Findings consistent with Cor Pulmonale.   TRANSTHORACIC ECHOCARDIOGRAM (10/31) IMPRESSIONS    1. Left ventricular ejection fraction, by estimation, is 60 to 65%. The  left ventricle has normal function. The left ventricle has no regional  wall motion abnormalities.  2. Right ventricular systolic function is mildly reduced. The right  ventricular size is mildly enlarged.   Comparison(s): The right ventricular systolic function has improved.   IRRIGATION AND DEBRIDEMENT (11/3)  Antimicrobials:  Unasyn   Subjective: Plan for I&D in OR today  Objective: Vitals:   01/27/20 0506 01/27/20 0743 01/27/20 0934 01/27/20 1121  BP: (!) 161/94 130/71 (!) 147/74 (!) 145/91  Pulse:  90 91 95  Resp:  15 13 16   Temp: 98.7 F (37.1 C) 98.2 F (36.8 C) 98.1 F (36.7 C) 98.4 F (36.9 C)  TempSrc: Oral Oral Oral Oral  SpO2:  100% 99% 100%  Weight:      Height:        Intake/Output Summary (Last 24 hours) at 01/27/2020 1231 Last data filed at 01/27/2020 1023 Gross per 24 hour  Intake 8205.27 ml  Output 3050 ml  Net 5155.27 ml   Filed Weights   01/22/20 0553 01/23/20 0458 01/24/20 0334  Weight: 83.3 kg 84 kg 83.3 kg     Examination:  General exam: Appears calm and comfortable Respiratory system: Respiratory effort normal. Musculoskeletal: Left leg with ACE wrap    Data Reviewed: I have personally reviewed following labs and imaging studies  CBC Lab Results  Component Value Date   WBC 12.2 (H) 01/27/2020   RBC 2.98 (L) 01/27/2020   HGB 8.1 (L) 01/27/2020   HCT 25.9 (L) 01/27/2020   MCV 86.9 01/27/2020   MCH 27.2 01/27/2020   PLT 313 01/27/2020   MCHC 31.3 01/27/2020   RDW 18.4 (H) 01/27/2020   LYMPHSABS 0.5 (L) 01/21/2020   MONOABS 0.2 01/21/2020   EOSABS 0.0 01/21/2020   BASOSABS 0.0 37/16/9678     Last metabolic panel Lab Results  Component Value Date   NA 138 01/27/2020   K 3.9 01/27/2020   CL 105 01/27/2020   CO2 27 01/27/2020   BUN <5 (L) 01/27/2020   CREATININE 0.78 01/27/2020   GLUCOSE 137 (H) 01/27/2020   GFRNONAA >60 01/27/2020   GFRAA >60 08/18/2018   CALCIUM 8.4 (L) 01/27/2020   PHOS 3.7 01/18/2020   PROT 5.7 (L) 01/27/2020   ALBUMIN 2.1 (L) 01/27/2020   LABGLOB 2.6 08/14/2017   AGRATIO 1.9 08/14/2017   BILITOT 0.7 01/27/2020   ALKPHOS 101 01/27/2020   AST 14 (L) 01/27/2020   ALT 10 01/27/2020   ANIONGAP 6 01/27/2020    CBG (last 3)  No results for input(s): GLUCAP in the last 72 hours.   GFR: Estimated Creatinine Clearance: 112.8 mL/min (by C-G formula based on SCr of  0.78 mg/dL).  Coagulation Profile: No results for input(s): INR, PROTIME in the last 168 hours.  Recent Results (from the past 240 hour(s))  Aerobic/Anaerobic Culture (surgical/deep wound)     Status: None   Collection Time: 01/18/20  4:18 PM   Specimen: Soft Tissue, Other  Result Value Ref Range Status   Specimen Description TISSUE  Final   Special Requests IRRIGATION AND DEBRIDMENT SEPTIC LEFT KNEE  Final   Gram Stain   Final    RARE WBC PRESENT,BOTH PMN AND MONONUCLEAR NO ORGANISMS SEEN    Culture   Final    No growth aerobically or anaerobically. Performed at [facility], [place] [address], [place], [place] [address]    Report Status 01/23/2020 FINAL  Final        Radiology Studies: No results found.      Scheduled Meds: . [MAR Hold] sodium chloride   Intravenous Once  . [MAR Hold] sodium chloride   Intravenous Once  . [MAR Hold] acetaminophen  1,000 mg Oral Q6H  . [MAR Hold] acetaminophen  650 mg Oral Once  . chlorhexidine  15 mL Mouth/Throat Once   Or  . mouth rinse  15 mL Mouth Rinse Once  . [MAR Hold] Chlorhexidine Gluconate Cloth  6 each Topical Daily  . [MAR Hold] docusate sodium  100 mg Oral BID  . [MAR Hold] gabapentin  300 mg Oral TID  . [MAR Hold] methocarbamol  1,000 mg Oral Q8H  . [MAR Hold] metoprolol tartrate  12.5 mg Oral BID  . [MAR Hold] potassium chloride  40 mEq Oral Daily  . [MAR Hold] sodium chloride flush  20 mL Intracatheter Q12H   Continuous Infusions: . sodium chloride 100 mL/hr at 01/27/20 0415  . [MAR Hold] ampicillin-sulbactam (UNASYN) IV 3 g (01/27/20 0930)  . ceFAZolin    .  ceFAZolin (ANCEF) IV    . heparin 1,900 Units/hr (01/26/20 2344)  . lactated ringers 10 mL/hr at 01/27/20 1158  . [MAR Hold] methocarbamol (ROBAXIN) IV       LOS: 16 days     Cordelia Poche, MD [facility] 01/27/2020, 12:31 PM  If 7PM-7AM, please contact night-coverage [email or web address]

## 2020-01-27 NOTE — Transfer of Care (Signed)
Immediate Anesthesia Transfer of Care Note  Patient: Jason [name]  Procedure(s) Performed: EXCISIONAL DEBRIDEMENT LEFT KNEE WITH ANTIBIOTIC BEADS (Left Knee)  Patient Location: PACU  Anesthesia Type:General  Level of Consciousness: drowsy and patient cooperative  Airway & Oxygen Therapy: Patient Spontanous Breathing and Patient connected to face mask oxygen  Post-op Assessment: Report given to RN and Post -op Vital signs reviewed and stable  Post vital signs: Reviewed and stable  Last Vitals:  Vitals Value Taken Time  BP 149/99 01/27/20 1409  Temp    Pulse 93 01/27/20 1412  Resp 14 01/27/20 1412  SpO2 100 % 01/27/20 1412  Vitals shown include unvalidated device data.  Last Pain:  Vitals:   01/27/20 1147  TempSrc:   PainSc: 8       Patients Stated Pain Goal: 3 (04/47/15 8063)  Complications: No complications documented.

## 2020-01-27 NOTE — Progress Notes (Signed)
Jason [name] for Heparin Indication: pulmonary embolus  Allergies  Allergen Reactions  . Ivp Dye [Iodinated Diagnostic Agents] Anaphylaxis    Can be pre-treated with Benadryl  . Metrizamide Anaphylaxis  . Other Other (See Comments)    Under no circumstances will the patient agree to a PICC line  . Reglan [Metoclopramide] Anaphylaxis  . Shellfish-Derived Products Anaphylaxis  . Codeine Hives  . Lidocaine Other (See Comments) and Rash    Pt not sure if this is an actual allergy - might have been a one time incident  . Methadone Hives  . Morphine Swelling and Rash    Local reaction to IV being pushed too fast  . Propoxyphene Hives  . Toradol [Ketorolac Tromethamine] Itching    Patient having systemic itching after receiving IV dose  . Iodine Rash    Reports localized reaction at IV site. Able to tolerate with Benadryl.   . Tape Rash  . Vancomycin Rash    Has had vancomycin since this reaction and had no reaction at all    Patient Measurements: Height: 5\' 10"  (177.8 cm) Weight: 83.3 kg (183 lb 10.3 oz) IBW/kg (Calculated) : 73 Heparin Dosing Weight: 79.4 kg  Vital Signs: Temp: 98.7 F (37.1 C) (11/12 0506) Temp Source: Oral (11/12 0506) BP: 161/94 (11/12 0506) Pulse Rate: 97 (11/12 0139)  Labs: Recent Labs    01/25/20 0506 01/25/20 1058 01/25/20 1144 01/25/20 1516 01/26/20 0406 01/26/20 1436 01/27/20 0416  HGB 7.1*  --    < > 6.8* 8.4*  --  8.1*  HCT 22.2*  --   --  21.2* 27.5*  --  25.9*  PLT 284  --   --   --  279  --  313  HEPARINUNFRC 0.23*   < >   < >  --  0.51 0.26* 0.27*  CREATININE 0.84  --   --   --  0.71  --  0.78   < > = values in this interval not displayed.    Estimated Creatinine Clearance: 112.8 mL/min (by C-G formula based on SCr of 0.78 mg/dL).   Medical History: Past Medical History:  Diagnosis Date  . Allergy   . Anxiety   . Arthritis    left shoulder  . Bronchitis   . Chest pain 07/14/2016  .  Complication of anesthesia   . DJD (degenerative joint disease) 11/29/2011  . Hypertension   . Neuromuscular disorder ([place])    carpal tunnel bilateral, ulner nerve surgery  . PONV (postoperative nausea and vomiting)   . Septic arthritis of knee, right ([place]) 11/29/2011  . Spinal headache    "long time ago"   Assessment: 54 yr old male POD#2 I&D L knee and L anterior thigh abscess. Pharmacy consulted 10/30 to switch IV heparin to Lovenox for possible pulmonary embolism due to difficult IV access, need for central line placement and CTA.   10/31: CTA showed bilateral non-occlusive PE. Pharmacy now consulted to switch back to heparin with plans to return to OR for repeat I&D.   Heparin infusion down to 1900 units/hr and heparin level is slightly subtherapeutic at 0.27- discussed with MD and okay to keep at lower range given bleeding. Hgb stable at 8.1, plt 313- after 1 PRBC 11/10. No infusion issues.  Hematoma is expanding in size, will watch closely- plan for I&D today.   Goal of Therapy:   Heparin level 0.3-0.5 units/mL Monitor platelets by anticoagulation protocol: Yes   Plan:  Continue heparin  to 1900 units/hr given expanding hematoma Monitor daily HL, CBC, and for s/sx of bleeding  F/u after procedure  Antonietta Jewel, PharmD, [place] Pharmacist  Phone: [phone] 01/27/2020 7:37 AM  Please check AMION for all [place] phone numbers After 10:00 PM, call [place] [phone]

## 2020-01-27 NOTE — Anesthesia Procedure Notes (Signed)
Procedure Name: LMA Insertion Date/Time: 01/27/2020 1:22 PM Performed by: Gwyndolyn Saxon, CRNA Pre-anesthesia Checklist: Patient identified, Emergency Drugs available, Suction available and Patient being monitored Patient Re-evaluated:Patient Re-evaluated prior to induction Oxygen Delivery Method: Circle system utilized Preoxygenation: Pre-oxygenation with 100% oxygen Induction Type: IV induction Ventilation: Mask ventilation without difficulty LMA: LMA inserted LMA Size: 5.0 Number of attempts: 1 Airway Equipment and Method: Patient positioned with wedge pillow Placement Confirmation: positive ETCO2 and breath sounds checked- equal and bilateral Tube secured with: Tape Dental Injury: Teeth and Oropharynx as per pre-operative assessment

## 2020-01-27 NOTE — Interval H&P Note (Signed)
History and Physical Interval Note:  01/27/2020 6:52 AM  Jason [name]  has presented today for surgery, with the diagnosis of Septic Left Knee.  The various methods of treatment have been discussed with the patient and family. After consideration of risks, benefits and other options for treatment, the patient has consented to  Procedure(s): EXCISIONAL DEBRIDEMENT LEFT KNEE WITH ANTIBIOTIC BEADS (Left) as a surgical intervention.  The patient's history has been reviewed, patient examined, no change in status, stable for surgery.  I have reviewed the patient's chart and labs.  Questions were answered to the patient's satisfaction.     Newt Minion

## 2020-01-27 NOTE — Anesthesia Postprocedure Evaluation (Signed)
Anesthesia Post Note  Patient: Jason [name]  Procedure(s) Performed: EXCISIONAL DEBRIDEMENT LEFT KNEE WITH ANTIBIOTIC BEADS (Left Knee)     Patient location during evaluation: PACU Anesthesia Type: General Level of consciousness: awake and alert Pain management: pain level controlled Vital Signs Assessment: post-procedure vital signs reviewed and stable Respiratory status: spontaneous breathing, nonlabored ventilation and respiratory function stable Cardiovascular status: blood pressure returned to baseline and stable Postop Assessment: no apparent nausea or vomiting Anesthetic complications: no   No complications documented.  Last Vitals:  Vitals:   01/27/20 1500 01/27/20 1517  BP:  (!) 150/97  Pulse: 87 89  Resp: 11 12  Temp: 36.8 C 36.6 C  SpO2: 99% 99%                  Audry Pili

## 2020-01-28 ENCOUNTER — Encounter (HOSPITAL_COMMUNITY): Payer: Self-pay | Admitting: Orthopedic Surgery

## 2020-01-28 LAB — BASIC METABOLIC PANEL
Anion gap: 7 (ref 5–15)
BUN: 8 mg/dL (ref 6–20)
CO2: 26 mmol/L (ref 22–32)
Calcium: 8.4 mg/dL — ABNORMAL LOW (ref 8.9–10.3)
Chloride: 105 mmol/L (ref 98–111)
Creatinine, Ser: 0.78 mg/dL (ref 0.61–1.24)
GFR, Estimated: >60 mL/min (ref >60)
Glucose, Bld: 137 mg/dL — ABNORMAL HIGH (ref 70–99)
Potassium: 4 mmol/L (ref 3.5–5.1)
Sodium: 138 mmol/L (ref 135–145)

## 2020-01-28 LAB — CBC
HCT: 22.7 % — ABNORMAL LOW (ref 39.0–52.0)
Hemoglobin: 7.1 g/dL — ABNORMAL LOW (ref 13.0–17.0)
MCH: 27.1 pg (ref 26.0–34.0)
MCHC: 31.3 g/dL (ref 30.0–36.0)
MCV: 86.6 fL (ref 80.0–100.0)
Platelets: 313 10*3/uL (ref 150–400)
RBC: 2.62 MIL/uL — ABNORMAL LOW (ref 4.22–5.81)
RDW: 18.2 % — ABNORMAL HIGH (ref 11.5–15.5)
WBC: 9.9 10*3/uL (ref 4.0–10.5)
nRBC: 0.2 % (ref 0.0–0.2)

## 2020-01-28 LAB — HEMOGLOBIN AND HEMATOCRIT, BLOOD
HCT: 25.9 % — ABNORMAL LOW (ref 39.0–52.0)
Hemoglobin: 7.8 g/dL — ABNORMAL LOW (ref 13.0–17.0)

## 2020-01-28 LAB — HEPARIN LEVEL (UNFRACTIONATED)
Heparin Unfractionated: <0.1 IU/mL — ABNORMAL LOW (ref 0.30–0.70)
Heparin Unfractionated: 0.41 IU/mL (ref 0.30–0.70)
Heparin Unfractionated: 1.54 IU/mL — ABNORMAL HIGH (ref 0.30–0.70)

## 2020-01-28 MED ORDER — HYDROMORPHONE HCL 1 MG/ML IJ SOLN
1.0000 mg | INTRAMUSCULAR | Status: DC | PRN
  Administered 2020-01-28 – 2020-01-30 (×21): 1 mg via INTRAVENOUS
  Filled 2020-01-28 (×21): qty 1

## 2020-01-28 NOTE — Progress Notes (Signed)
Patient ID: Jason [name], male   DOB: 09-Aug-1965, 54 y.o.   MRN: [id number] Patient is seen in follow-up postop day 1 repeat irrigation and debridement of the left knee.  Patient had a massive hematoma in his knee most likely secondary to his heparin therapy.  There is degenerative changes of the retinaculum.  Discussed the importance that he needs to wear the knee immobilizer whenever transferring or gait training patient is to strictly not bend his knee.  I will increase his Dilaudid to every 2 hours as needed pain

## 2020-01-28 NOTE — Progress Notes (Signed)
PROGRESS NOTE    Jason [name]  OEU:[id number] DOB: Jan 10, 1966 DOA: 01/11/2020 PCP: Patient, No Pcp Per   Brief Narrative: Jason [name] is a 54 y.o. male with medical history significant of  Sp AKA, septir arthritis of left knee, h/o opioid dependence,documented Munchausen syndrome. Patient presented secondary to LOC, chest pain and knee pain. Syncope workup in addition to management of septic arthritis.   Assessment & Plan:   Principal Problem:   Bilateral pulmonary embolism (HCC) Active Problems:   Essential hypertension   S/P AKA (above knee amputation) unilateral, right (HCC)   Other chronic pain   Septic arthritis (HCC)   Syncope   Elevated troponin   Chest pain   Bright red blood per rectum   Anemia   Encounter for central line placement   Osteomyelitis of left knee region [facility])   Chest pain Present on admission and has improved. Cardiology consulted. HS troponin elevated: 77>93>68. Elevated D-dimer of 9.72 and Transthoracic Echocardiogram significant for RV dilation with concern for possible PE. No chest pain currently. On room air. Negative venous duplex for DVT. PE confirmed on CTA chest. Chest pain improved with treatment of PEs.  Bilateral PEs Submassive PE Non-occlusive on CTA. No heart strain per CT but initial Transthoracic Echocardiogram with moderate RV dilation and reduced function. Repeat Transthoracic Echocardiogram significant for improved RV. Pulmonology consulted and recommended to continue IV heparin with eventual transition to NOAC. -Continue Heparin IV (dose reduced ); may need to consider IV filter if unable to control bleeding.  Syncope and collapse Likely secondary to PE.  -Management above  Septic arthritis Recurring issue. Per chart review, patient was previously managed at [facility] with concern patient may have Munchausen and is possibly inoculating himself. Previous cultures (10/3) from OSH significant for significant for  strep mitis/oralis/anginosus/parasnguinis with blood culture significant for strep anginosus/strepp salivarius/gemella haemolysans (as mentioned in H&P) with susceptibilities to penicillin. Prevotella denticola isolated on this admission. Orthopedic surgery consulted and patient underwent I&D on 10/28 in the OR with vancomycin/tobramycin cement beads placed. Arthrotomy and excisional debridement performed on 11/3 with vancomycin/gentamicin stimulant antibiotic bead placement. Started on Unasyn IV per ID. Excisional debridement performed on 11/12. -ID recommendations: Unasyn IV x6 weeks with end date of 02/29/2020 -Orthopedic surgery recommendations: pain control, brace while ambulating  Bright red blood per rectum FOBT negative x1. Hemoglobin tended down slightly from admission. No recurrence.  Acute on chronic Anemia Likely related to large and ongoing hematoma of left knee. Baseline appears to be between 8-9. Worsened hemoglobin with a low of 6.5 on 10/29. Patient has received about 5.5 units of PRBC to date. Patient with large hematoma of wound found on 11/10 which is likely contributing to continued anemia. Complicated by heparin drip. Hemoglobin dropped again. -Daily CBC -H&H this afternoon; repeat blood transfusion if hemoglobin <7  Elevated temperature Nursing obtained temperature with an elevated reading of 101.5 F. Repeat of 98.6 F without Tylenol. Resolved.   DVT prophylaxis: SCDs Code Status:   Code Status: Full Code Family Communication: None at bedside Disposition Plan: Discharge to SNF pending transition to NOAC once hemoglobin is stable.   Consultants:   Orthopedic surgery  Cardiology  Pulmonology  Infectious  disease  Procedures:   INCISION & DRAINAGE (10/28) 1. Incision and drainage of left septic knee 2. Incision and drainage of left anterior thigh abscess 3. Placement of antibiotic cement  4. Incisional wound vac placement   TRANSTHORACIC ECHOCARDIOGRAM  (10/29) IMPRESSIONS    1.  The RV is severely dilated with moderately reduced function. Would  consider acute PE to explain tachycardia, syncope, and elevated troponin.  2. Left ventricular ejection fraction, by estimation, is 60 to 65%. The  left ventricle has normal function. The left ventricle has no regional  wall motion abnormalities. Left ventricular diastolic parameters were  normal. There is the interventricular  septum is flattened in systole, consistent with right ventricular pressure  overload.  3. Right ventricular systolic function is moderately reduced. The right  ventricular size is severely enlarged.  4. The mitral valve is grossly normal. Trivial mitral valve  regurgitation. No evidence of mitral stenosis.  5. The aortic valve is tricuspid. Aortic valve regurgitation is not  visualized. No aortic stenosis is present.   Conclusion(s)/Recommendation(s): Findings consistent with Cor Pulmonale.   TRANSTHORACIC ECHOCARDIOGRAM (10/31) IMPRESSIONS    1. Left ventricular ejection fraction, by estimation, is 60 to 65%. The  left ventricle has normal function. The left ventricle has no regional  wall motion abnormalities.  2. Right ventricular systolic function is mildly reduced. The right  ventricular size is mildly enlarged.   Comparison(s): The right ventricular systolic function has improved.   IRRIGATION AND DEBRIDEMENT (11/3)  Antimicrobials:  Unasyn   Subjective: Left knee pain.  Objective: Vitals:   01/27/20 2107 01/27/20 2335 01/28/20 0541 01/28/20 0747  BP: 129/90  130/88 134/85  Pulse: 89   95  Resp:      Temp:  98 F (36.7 C) 98.7 F (37.1 C) 97.9 F (36.6 C)  TempSrc:  Oral Oral Oral  SpO2:    96%  Weight:      Height:        Intake/Output Summary (Last 24 hours) at 01/28/2020 1124 Last data filed at 01/28/2020 0942 Gross per 24 hour  Intake 1351.77 ml  Output 2825 ml  Net -1473.23 ml   Filed Weights   01/22/20 0553  01/23/20 0458 01/24/20 0334  Weight: 83.3 kg 84 kg 83.3 kg    Examination:  General exam: Appears calm and comfortable Respiratory system: Clear to auscultation. Respiratory effort normal. Cardiovascular system: S1 & S2 heard, RRR. No murmurs, rubs, gallops or clicks. Gastrointestinal system: Abdomen is nondistended, soft and nontender. No organomegaly or masses felt. Normal bowel sounds heard. Central nervous system: Alert and oriented. No focal neurological deficits. Musculoskeletal: Right AKA. Left leg in full ACE bandage Skin: No cyanosis. No rashes Psychiatry: Judgement and insight appear normal. Mood & affect appropriate.   Data Reviewed: I have personally reviewed following labs and imaging studies  CBC Lab Results  Component Value Date   WBC 9.9 01/28/2020   RBC 2.62 (L) 01/28/2020   HGB 7.1 (L) 01/28/2020   HCT 22.7 (L) 01/28/2020   MCV 86.6 01/28/2020   MCH 27.1 01/28/2020   PLT 313 01/28/2020   MCHC 31.3 01/28/2020   RDW 18.2 (H) 01/28/2020   LYMPHSABS 0.5 (L) 01/21/2020   MONOABS 0.2 01/21/2020   EOSABS 0.0 01/21/2020   BASOSABS 0.0 15/17/6160     Last metabolic panel Lab Results  Component Value Date   NA 138 01/28/2020   K 4.0 01/28/2020   CL 105 01/28/2020   CO2 26 01/28/2020   BUN 8 01/28/2020   CREATININE 0.78 01/28/2020   GLUCOSE 137 (H) 01/28/2020   GFRNONAA >60 01/28/2020   GFRAA >60 08/18/2018   CALCIUM 8.4 (L) 01/28/2020   PHOS 3.7 01/18/2020   PROT 5.7 (L) 01/27/2020   ALBUMIN 2.1 (L) 01/27/2020   LABGLOB 2.6  08/14/2017   AGRATIO 1.9 08/14/2017   BILITOT 0.7 01/27/2020   ALKPHOS 101 01/27/2020   AST 14 (L) 01/27/2020   ALT 10 01/27/2020   ANIONGAP 7 01/28/2020    CBG (last 3)  No results for input(s): GLUCAP in the last 72 hours.   GFR: Estimated Creatinine Clearance: 112.8 mL/min (by C-G formula based on SCr of 0.78 mg/dL).  Coagulation Profile: No results for input(s): INR, PROTIME in the last 168 hours.  Recent Results  (from the past 240 hour(s))  Aerobic/Anaerobic Culture (surgical/deep wound)     Status: None   Collection Time: 01/18/20  4:18 PM   Specimen: Soft Tissue, Other  Result Value Ref Range Status   Specimen Description TISSUE  Final   Special Requests IRRIGATION AND DEBRIDMENT SEPTIC LEFT KNEE  Final   Gram Stain   Final    RARE WBC PRESENT,BOTH PMN AND MONONUCLEAR NO ORGANISMS SEEN    Culture   Final    No growth aerobically or anaerobically. Performed at [facility], [place] [address] [place], [place] [address]    Report Status 01/23/2020 FINAL  Final        Radiology Studies: No results found.      Scheduled Meds: . sodium chloride   Intravenous Once  . sodium chloride   Intravenous Once  . acetaminophen  1,000 mg Oral Q6H  . Chlorhexidine Gluconate Cloth  6 each Topical Daily  . docusate sodium  100 mg Oral BID  . gabapentin  300 mg Oral TID  . methocarbamol  1,000 mg Oral Q8H  . metoprolol tartrate  12.5 mg Oral BID  . potassium chloride  40 mEq Oral Daily  . sodium chloride flush  10-40 mL Intracatheter Q12H  . sodium chloride flush  20 mL Intracatheter Q12H   Continuous Infusions: . sodium chloride 100 mL/hr at 01/27/20 0415  . sodium chloride 75 mL/hr at 01/28/20 0316  . ampicillin-sulbactam (UNASYN) IV 3 g (01/28/20 1040)  . heparin 1,900 Units/hr (01/28/20 0821)  . methocarbamol (ROBAXIN) IV       LOS: 17 days     Cordelia Poche, MD [facility] 01/28/2020, 11:24 AM  If 7PM-7AM, please contact night-coverage [email or web address]

## 2020-01-28 NOTE — Progress Notes (Addendum)
[place] for Heparin Indication: pulmonary embolus  Allergies  Allergen Reactions  . Ivp Dye [Iodinated Diagnostic Agents] Anaphylaxis    Can be pre-treated with Benadryl  . Metrizamide Anaphylaxis  . Other Other (See Comments)    Under no circumstances will the patient agree to a PICC line  . Reglan [Metoclopramide] Anaphylaxis  . Shellfish-Derived Products Anaphylaxis  . Codeine Hives  . Lidocaine Other (See Comments) and Rash    Pt not sure if this is an actual allergy - might have been a one time incident  . Methadone Hives  . Morphine Swelling and Rash    Local reaction to IV being pushed too fast  . Propoxyphene Hives  . Toradol [Ketorolac Tromethamine] Itching    Patient having systemic itching after receiving IV dose  . Iodine Rash    Reports localized reaction at IV site. Able to tolerate with Benadryl.   . Tape Rash  . Vancomycin Rash    Has had vancomycin since this reaction and had no reaction at all    Patient Measurements: Height: 5\' 10"  (177.8 cm) Weight: 83.3 kg (183 lb 10.3 oz) IBW/kg (Calculated) : 73 Heparin Dosing Weight: 79.4 kg  Vital Signs: Temp: 98.7 F (37.1 C) (11/13 0541) Temp Source: Oral (11/13 0541) BP: 130/88 (11/13 0541) Pulse Rate: 89 (11/12 2107)  Labs: Recent Labs    01/26/20 0406 01/26/20 0406 01/26/20 1436 01/27/20 0416 01/28/20 0347  HGB 8.4*   < >  --  8.1* 7.1*  HCT 27.5*  --   --  25.9* 22.7*  PLT 279  --   --  313 313  HEPARINUNFRC 0.51   < > 0.26* 0.27* <0.10*  CREATININE 0.71  --   --  0.78  --    < > = values in this interval not displayed.    Estimated Creatinine Clearance: 112.8 mL/min (by C-G formula based on SCr of 0.78 mg/dL).   Medical History: Past Medical History:  Diagnosis Date  . Allergy   . Anxiety   . Arthritis    left shoulder  . Bronchitis   . Chest pain 07/14/2016  . Complication of anesthesia   . DJD (degenerative joint disease) 11/29/2011  .  Hypertension   . Neuromuscular disorder ([place])    carpal tunnel bilateral, ulner nerve surgery  . PONV (postoperative nausea and vomiting)   . Septic arthritis of knee, right ([place]) 11/29/2011  . Spinal headache    "long time ago"   Assessment: 54 yo male s/p 3 I&D L knee and L anterior thigh abscess. Pharmacy consulted 10/30 to switch IV heparin to SubQ enoxaparin for possible pulmonary embolism due to difficult IV access, need for central line placement and CTA.   10/31: CTA showed bilateral non-occlusive PE. Pharmacy now consulted to switch back to heparin with plans to return to OR for repeat I&D.   11/12: Heparin was infusion down to 1900 units/hr and heparin level was slightly subtherapeutic at 0.27. The pharmacist discussed with the MD who said it was okay to keep at a lower range given bleeding. The hematoma was expanding and the patient received excisional debridement of the L knee w/ antibiotic beads on 11/12. When the patient returned from the OR 11/12, the heparin drip was running. Dr. Sharol Given elected to hold the heparin overnight and restart the next morning.  11/13: Per Dr. Sharol Given, the heparin drip was turned off overnight and was to resume this morning at 0800. Hgb this morning  was down to 7.1 from 8.1. Heparin level this afternoon is therapeutic at 0.41 while running at 1900 units/hr. Hgb is now improved at 7.8, platelets are stable at 313. Per the RN, there are no signs or symptoms of bleeding or issues with the infusion.  Goal of Therapy:   Heparin level 0.3-0.5 units/mL Monitor platelets by anticoagulation protocol: Yes   Plan:  Continue heparin IV at 1900 units/hr Obtain 6 hour heparin level Monitor daily CBC and heparin level Monitor signs and symptoms of bleeding  Shauna Hugh, PharmD, [place]  PGY-1 Pharmacy Resident 01/28/2020 6:58 AM  Please check [email or web address] for unit-specific pharmacy phone numbers.

## 2020-01-29 LAB — CBC
HCT: 26.3 % — ABNORMAL LOW (ref 39.0–52.0)
Hemoglobin: 8.1 g/dL — ABNORMAL LOW (ref 13.0–17.0)
MCH: 27.6 pg (ref 26.0–34.0)
MCHC: 30.8 g/dL (ref 30.0–36.0)
MCV: 89.8 fL (ref 80.0–100.0)
Platelets: 369 10*3/uL (ref 150–400)
RBC: 2.93 MIL/uL — ABNORMAL LOW (ref 4.22–5.81)
RDW: 18.5 % — ABNORMAL HIGH (ref 11.5–15.5)
WBC: 8.6 10*3/uL (ref 4.0–10.5)
nRBC: 0.2 % (ref 0.0–0.2)

## 2020-01-29 LAB — HEPARIN LEVEL (UNFRACTIONATED)
Heparin Unfractionated: 0.52 IU/mL (ref 0.30–0.70)
Heparin Unfractionated: 0.41 IU/mL (ref 0.30–0.70)

## 2020-01-29 LAB — BASIC METABOLIC PANEL
Anion gap: 10 (ref 5–15)
BUN: 7 mg/dL (ref 6–20)
CO2: 26 mmol/L (ref 22–32)
Calcium: 8.5 mg/dL — ABNORMAL LOW (ref 8.9–10.3)
Chloride: 104 mmol/L (ref 98–111)
Creatinine, Ser: 0.87 mg/dL (ref 0.61–1.24)
GFR, Estimated: >60 mL/min (ref >60)
Glucose, Bld: 109 mg/dL — ABNORMAL HIGH (ref 70–99)
Potassium: 3.9 mmol/L (ref 3.5–5.1)
Sodium: 140 mmol/L (ref 135–145)

## 2020-01-29 NOTE — Progress Notes (Signed)
Patient resting in bed at this time states his pain is not well controlled by pain medication he is receiving. Provider had changed the frequency of his dilaudid to Q2hrs as needed yesterday. Patient have consinstentlly asked for dilaudid every 2hrs this shift, reluctant to take his Oxycodone stating that it is not helping his pain like dilaudid. Instructed pt to try to alternate both medication for better pain control,  All questions answered with patient verbalizingg understanding, will continue to monitor closely.

## 2020-01-29 NOTE — Progress Notes (Signed)
ANTICOAGULATION CONSULT NOTE - Follow Up Consult  Pharmacy Consult for heparin Indication: pulmonary embolus   Labs: Recent Labs    01/26/20 0406 01/26/20 1436 01/27/20 0416 01/27/20 0416 01/28/20 0347 01/28/20 0347 01/28/20 0807 01/28/20 1243 01/28/20 2221 01/29/20 0302  HGB 8.4*  --  8.1*   < > 7.1*   < >  --  7.8*  --  8.1*  HCT 27.5*  --  25.9*   < > 22.7*  --   --  25.9*  --  26.3*  PLT 279  --  313  --  313  --   --   --   --  369  HEPARINUNFRC 0.51   < > 0.27*   < > <0.10*   < >  --  0.41 1.54* 0.52  CREATININE 0.71  --  0.78  --   --   --  0.78  --   --   --    < > = values in this interval not displayed.    Assessment: 54yo male slightly supratherapeutic on heparin after one level at goal (the high level earlier this shift was drawn from same line as where heparin was running and was therefore inaccurate); no gtt issues or signs of bleeding per RN.  Goal of Therapy:  Heparin level 0.3-0.5 units/ml   Plan:  Will decrease heparin gtt by ~1 unit/kg/hr to 1800 units/hr and check level in 6 hours.    Wynona Neat, PharmD, BCPS  01/29/2020,4:02 AM

## 2020-01-29 NOTE — Progress Notes (Signed)
PROGRESS NOTE    Jason [name]  NWG:[id number] DOB: 11-28-1965 DOA: 01/11/2020 PCP: Patient, No Pcp Per   Brief Narrative: Jason [name] is a 54 y.o. male with medical history significant of  Sp AKA, septir arthritis of left knee, h/o opioid dependence,documented Munchausen syndrome. Patient presented secondary to LOC, chest pain and knee pain. Syncope workup in addition to management of septic arthritis.   Assessment & Plan:   Principal Problem:   Bilateral pulmonary embolism (HCC) Active Problems:   Essential hypertension   S/P AKA (above knee amputation) unilateral, right (HCC)   Other chronic pain   Septic arthritis (HCC)   Syncope   Elevated troponin   Chest pain   Bright red blood per rectum   Anemia   Encounter for central line placement   Osteomyelitis of left knee region [facility])   Chest pain Present on admission and has improved. Cardiology consulted. HS troponin elevated: 77>93>68. Elevated D-dimer of 9.72 and Transthoracic Echocardiogram significant for RV dilation with concern for possible PE. No chest pain currently. On room air. Negative venous duplex for DVT. PE confirmed on CTA chest. Chest pain improved with treatment of PEs.  Bilateral PEs Submassive PE Non-occlusive on CTA. No heart strain per CT but initial Transthoracic Echocardiogram with moderate RV dilation and reduced function. Repeat Transthoracic Echocardiogram significant for improved RV. Pulmonology consulted and recommended to continue IV heparin with eventual transition to NOAC. -Continue Heparin IV; may need to consider IV filter if unable to control bleeding.  Syncope and collapse Likely secondary to PE.  -Management above  Septic arthritis Recurring issue. Per chart review, patient was previously managed at [facility] with concern patient may have Munchausen and is possibly inoculating himself. Previous cultures (10/3) from OSH significant for significant for strep  mitis/oralis/anginosus/parasnguinis with blood culture significant for strep anginosus/strepp salivarius/gemella haemolysans (as mentioned in H&P) with susceptibilities to penicillin. Prevotella denticola isolated on this admission. Orthopedic surgery consulted and patient underwent I&D on 10/28 in the OR with vancomycin/tobramycin cement beads placed. Arthrotomy and excisional debridement performed on 11/3 with vancomycin/gentamicin stimulant antibiotic bead placement. Started on Unasyn IV per ID. Excisional debridement performed on 11/12. -ID recommendations: Unasyn IV x6 weeks with end date of 02/29/2020 -Orthopedic surgery recommendations: pain control, brace while ambulating  Bright red blood per rectum FOBT negative x1. Hemoglobin tended down slightly from admission. No recurrence.  Acute on chronic Anemia Likely related to large and ongoing hematoma of left knee. Baseline appears to be between 8-9. Worsened hemoglobin with a low of 6.5 on 10/29. Patient has received about 5.5 units of PRBC to date. Patient with large hematoma of wound found on 11/10 which is likely contributing to continued anemia. Complicated by heparin drip. Hemoglobin stabilized. -Daily CBC  Elevated temperature Nursing obtained temperature with an elevated reading of 101.5 F. Repeat of 98.6 F without Tylenol. Resolved.   DVT prophylaxis: SCDs Code Status:   Code Status: Full Code Family Communication: None at bedside Disposition Plan: Discharge to SNF pending transition to NOAC once hemoglobin is stable possibly in 1-2 days   Consultants:   Orthopedic surgery  Cardiology  Pulmonology  Infectious  disease  Procedures:   INCISION & DRAINAGE (10/28) 1. Incision and drainage of left septic knee 2. Incision and drainage of left anterior thigh abscess 3. Placement of antibiotic cement  4. Incisional wound vac placement   TRANSTHORACIC ECHOCARDIOGRAM (10/29) IMPRESSIONS    1. The RV is severely  dilated with moderately reduced function.  Would  consider acute PE to explain tachycardia, syncope, and elevated troponin.  2. Left ventricular ejection fraction, by estimation, is 60 to 65%. The  left ventricle has normal function. The left ventricle has no regional  wall motion abnormalities. Left ventricular diastolic parameters were  normal. There is the interventricular  septum is flattened in systole, consistent with right ventricular pressure  overload.  3. Right ventricular systolic function is moderately reduced. The right  ventricular size is severely enlarged.  4. The mitral valve is grossly normal. Trivial mitral valve  regurgitation. No evidence of mitral stenosis.  5. The aortic valve is tricuspid. Aortic valve regurgitation is not  visualized. No aortic stenosis is present.   Conclusion(s)/Recommendation(s): Findings consistent with Cor Pulmonale.   TRANSTHORACIC ECHOCARDIOGRAM (10/31) IMPRESSIONS    1. Left ventricular ejection fraction, by estimation, is 60 to 65%. The  left ventricle has normal function. The left ventricle has no regional  wall motion abnormalities.  2. Right ventricular systolic function is mildly reduced. The right  ventricular size is mildly enlarged.   Comparison(s): The right ventricular systolic function has improved.   IRRIGATION AND DEBRIDEMENT (11/3)  EXCISIONAL DEBRIDEMENT (11/12)  Antimicrobials:  Unasyn   Subjective: Continued left knee pain  Objective: Vitals:   01/28/20 1444 01/29/20 0026 01/29/20 0506 01/29/20 0717  BP: 109/67 (!) 148/81 (!) 147/82 (!) 144/68  Pulse: 92 80 98 91  Resp:  14 18   Temp: 97.8 F (36.6 C) 98.2 F (36.8 C) 97.9 F (36.6 C) 97.9 F (36.6 C)  TempSrc: Oral Oral Oral   SpO2: 100% 98% 96% 98%  Weight:   81.8 kg   Height:        Intake/Output Summary (Last 24 hours) at 01/29/2020 1101 Last data filed at 01/29/2020 0303 Gross per 24 hour  Intake --  Output 2600 ml  Net -2600  ml   Filed Weights   01/23/20 0458 01/24/20 0334 01/29/20 0506  Weight: 84 kg 83.3 kg 81.8 kg    Examination:  General exam: Appears calm and comfortable Respiratory system: Clear to auscultation. Respiratory effort normal. Cardiovascular system: S1 & S2 heard, RRR. No murmurs, rubs, gallops or clicks. Gastrointestinal system: Abdomen is nondistended, soft and nontender. No organomegaly or masses felt. Normal bowel sounds heard. Central nervous system: Alert and oriented. No focal neurological deficits. Musculoskeletal: Left leg with ACE wrap Skin: No cyanosis. No rashes Psychiatry: Judgement and insight appear normal. Mood & affect appropriate.   Data Reviewed: I have personally reviewed following labs and imaging studies  CBC Lab Results  Component Value Date   WBC 8.6 01/29/2020   RBC 2.93 (L) 01/29/2020   HGB 8.1 (L) 01/29/2020   HCT 26.3 (L) 01/29/2020   MCV 89.8 01/29/2020   MCH 27.6 01/29/2020   PLT 369 01/29/2020   MCHC 30.8 01/29/2020   RDW 18.5 (H) 01/29/2020   LYMPHSABS 0.5 (L) 01/21/2020   MONOABS 0.2 01/21/2020   EOSABS 0.0 01/21/2020   BASOSABS 0.0 93/23/5573     Last metabolic panel Lab Results  Component Value Date   NA 140 01/29/2020   K 3.9 01/29/2020   CL 104 01/29/2020   CO2 26 01/29/2020   BUN 7 01/29/2020   CREATININE 0.87 01/29/2020   GLUCOSE 109 (H) 01/29/2020   GFRNONAA >60 01/29/2020   GFRAA >60 08/18/2018   CALCIUM 8.5 (L) 01/29/2020   PHOS 3.7 01/18/2020   PROT 5.7 (L) 01/27/2020   ALBUMIN 2.1 (L) 01/27/2020   LABGLOB 2.6 08/14/2017  AGRATIO 1.9 08/14/2017   BILITOT 0.7 01/27/2020   ALKPHOS 101 01/27/2020   AST 14 (L) 01/27/2020   ALT 10 01/27/2020   ANIONGAP 10 01/29/2020    CBG (last 3)  No results for input(s): GLUCAP in the last 72 hours.   GFR: Estimated Creatinine Clearance: 103.7 mL/min (by C-G formula based on SCr of 0.87 mg/dL).  Coagulation Profile: No results for input(s): INR, PROTIME in the last 168  hours.  No results found for this or any previous visit (from the past 240 hour(s)).      Radiology Studies: No results found.      Scheduled Meds: . sodium chloride   Intravenous Once  . sodium chloride   Intravenous Once  . acetaminophen  1,000 mg Oral Q6H  . Chlorhexidine Gluconate Cloth  6 each Topical Daily  . docusate sodium  100 mg Oral BID  . gabapentin  300 mg Oral TID  . methocarbamol  1,000 mg Oral Q8H  . metoprolol tartrate  12.5 mg Oral BID  . potassium chloride  40 mEq Oral Daily  . sodium chloride flush  10-40 mL Intracatheter Q12H  . sodium chloride flush  20 mL Intracatheter Q12H   Continuous Infusions: . sodium chloride 100 mL/hr at 01/27/20 0415  . sodium chloride 75 mL/hr at 01/28/20 0316  . ampicillin-sulbactam (UNASYN) IV 3 g (01/29/20 1610)  . heparin 1,800 Units/hr (01/29/20 0406)  . methocarbamol (ROBAXIN) IV       LOS: 18 days     Cordelia Poche, MD [facility] 01/29/2020, 11:01 AM  If 7PM-7AM, please contact night-coverage [email or web address]

## 2020-01-29 NOTE — Progress Notes (Signed)
[place] for Heparin Indication: pulmonary embolus  Allergies  Allergen Reactions  . Ivp Dye [Iodinated Diagnostic Agents] Anaphylaxis    Can be pre-treated with Benadryl  . Metrizamide Anaphylaxis  . Other Other (See Comments)    Under no circumstances will the patient agree to a PICC line  . Reglan [Metoclopramide] Anaphylaxis  . Shellfish-Derived Products Anaphylaxis  . Codeine Hives  . Lidocaine Other (See Comments) and Rash    Pt not sure if this is an actual allergy - might have been a one time incident  . Methadone Hives  . Morphine Swelling and Rash    Local reaction to IV being pushed too fast  . Propoxyphene Hives  . Toradol [Ketorolac Tromethamine] Itching    Patient having systemic itching after receiving IV dose  . Iodine Rash    Reports localized reaction at IV site. Able to tolerate with Benadryl.   . Tape Rash  . Vancomycin Rash    Has had vancomycin since this reaction and had no reaction at all    Patient Measurements: Height: 5\' 10"  (177.8 cm) Weight: 81.8 kg (180 lb 5.4 oz) IBW/kg (Calculated) : 73 Heparin Dosing Weight: 79.4 kg  Vital Signs: Temp: 97.9 F (36.6 C) (11/14 0717) Temp Source: Oral (11/14 0506) BP: 144/68 (11/14 0717) Pulse Rate: 91 (11/14 0717)  Labs: Recent Labs    01/27/20 0416 01/27/20 0416 01/28/20 0347 01/28/20 0347 01/28/20 0807 01/28/20 1243 01/28/20 2221 01/29/20 0302  HGB 8.1*   < > 7.1*   < >  --  7.8*  --  8.1*  HCT 25.9*   < > 22.7*  --   --  25.9*  --  26.3*  PLT 313  --  313  --   --   --   --  369  HEPARINUNFRC 0.27*   < > <0.10*   < >  --  0.41 1.54* 0.52  CREATININE 0.78  --   --   --  0.78  --   --  0.87   < > = values in this interval not displayed.    Estimated Creatinine Clearance: 103.7 mL/min (by C-G formula based on SCr of 0.87 mg/dL).   Medical History: Past Medical History:  Diagnosis Date  . Allergy   . Anxiety   . Arthritis    left shoulder  .  Bronchitis   . Chest pain 07/14/2016  . Complication of anesthesia   . DJD (degenerative joint disease) 11/29/2011  . Hypertension   . Neuromuscular disorder ([place])    carpal tunnel bilateral, ulner nerve surgery  . PONV (postoperative nausea and vomiting)   . Septic arthritis of knee, right ([place]) 11/29/2011  . Spinal headache    "long time ago"   Assessment: 54 yo male s/p 3 I&D L knee and L anterior thigh abscess. Pharmacy consulted 10/30 to switch IV heparin to SubQ enoxaparin for possible pulmonary embolism due to difficult IV access, need for central line placement and CTA.   10/31: CTA showed bilateral non-occlusive PE. Pharmacy now consulted to switch back to heparin with plans to return to OR for repeat I&D.   11/12: Heparin was infusion down to 1900 units/hr and heparin level was slightly subtherapeutic at 0.27. The pharmacist discussed with the MD who said it was okay to keep at a lower range given bleeding. The hematoma was expanding and the patient received excisional debridement of the L knee w/ antibiotic beads on 11/12. When the patient  returned from the OR 11/12, the heparin drip was running. Dr. Sharol Given elected to hold the heparin overnight and restart the next morning.  11/13: Per Dr. Sharol Given, the heparin drip was turned off overnight and was to resume in the morning at 0800. Hgb this morning was down to 7.1 from 8.1 but the MD wanted to continue the heparin and transfuse for Hgb <7 as needed.  The heparin level is therapeutic at 0.41 while running at 1800 units/hr. Per the RN, there are no signs or symptoms of bleeding and there are no issues with the infusion. FOBT negative. Hgb is improved at 8.1 and platelets remain stable.  Goal of Therapy:   Heparin level 0.3-0.5 units/mL Monitor platelets by anticoagulation protocol: Yes   Plan:  Continue heparin IV at 1800 units/hr Monitor daily CBC and heparin level Monitor signs and symptoms of bleeding  Shauna Hugh, PharmD, [place]   PGY-1 Pharmacy Resident 01/29/2020 8:23 AM  Please check [email or web address] for unit-specific pharmacy phone numbers.

## 2020-01-29 NOTE — TOC Progression Note (Signed)
Transition of Care [facility]) - Progression Note    Patient Details  Name: Jason [name] MRN: [id number] Date of Birth: 03/02/1966  Transition of Care [facility]) CM/SW [place], [place] Phone Number: [phone] 01/29/2020, 4:18 PM  Clinical Narrative:     CSW spoke with patient in regards to his 2 bed offers. Patient informed CSW that he prefers to go to  [facility] in [place] and did not want to accepts the offers and wants to wait to see if [facility] offers a bed.  TOC team will continue to assist with discharge planning needs.  Expected Discharge Plan: [place] Barriers to Discharge: Continued Medical Work up  Expected Discharge Plan and Services Expected Discharge Plan: [place]   Discharge Planning Services: CM Consult Post Acute Care Choice: Home Health (has wheelchair) Living arrangements for the past 2 months: [place]: PT, OT [place] Agency: [place] Date [place]: 01/16/20 Time [place]: 1052 Representative spoke with at [place]: Adela Lank   Social Determinants of Health ([place]) Interventions    Readmission Risk Interventions No flowsheet data found.

## 2020-01-30 LAB — BASIC METABOLIC PANEL
Anion gap: 6 (ref 5–15)
BUN: 6 mg/dL (ref 6–20)
CO2: 26 mmol/L (ref 22–32)
Calcium: 7.9 mg/dL — ABNORMAL LOW (ref 8.9–10.3)
Chloride: 106 mmol/L (ref 98–111)
Creatinine, Ser: 0.68 mg/dL (ref 0.61–1.24)
GFR, Estimated: >60 mL/min (ref >60)
Glucose, Bld: 104 mg/dL — ABNORMAL HIGH (ref 70–99)
Potassium: 3.7 mmol/L (ref 3.5–5.1)
Sodium: 138 mmol/L (ref 135–145)

## 2020-01-30 LAB — CBC
HCT: 24.7 % — ABNORMAL LOW (ref 39.0–52.0)
Hemoglobin: 7.6 g/dL — ABNORMAL LOW (ref 13.0–17.0)
MCH: 27.6 pg (ref 26.0–34.0)
MCHC: 30.8 g/dL (ref 30.0–36.0)
MCV: 89.8 fL (ref 80.0–100.0)
Platelets: 368 10*3/uL (ref 150–400)
RBC: 2.75 MIL/uL — ABNORMAL LOW (ref 4.22–5.81)
RDW: 18.2 % — ABNORMAL HIGH (ref 11.5–15.5)
WBC: 8.6 10*3/uL (ref 4.0–10.5)
nRBC: 0.5 % — ABNORMAL HIGH (ref 0.0–0.2)

## 2020-01-30 LAB — HEPARIN LEVEL (UNFRACTIONATED)
Heparin Unfractionated: 0.1 IU/mL — ABNORMAL LOW (ref 0.30–0.70)
Heparin Unfractionated: 0.52 IU/mL (ref 0.30–0.70)

## 2020-01-30 MED ORDER — HYDROMORPHONE HCL 1 MG/ML IJ SOLN
1.0000 mg | INTRAMUSCULAR | Status: DC | PRN
  Administered 2020-01-30 – 2020-02-01 (×11): 1 mg via INTRAVENOUS
  Filled 2020-01-30 (×11): qty 1

## 2020-01-30 NOTE — Progress Notes (Signed)
Patient ID: Jason [name], male   DOB: Dec 28, 1965, 54 y.o.   MRN: [id number] Patient is status post repeat irrigation debridement left knee for septic arthritis there is no drainage in the wound VAC canister there is no swelling at this time.  Again discussed the importance of not bending the knee and using the knee immobilizer for gait training.  Anticipate discharge to skilled nursing anticipate discharge on oral antibiotics.

## 2020-01-30 NOTE — Progress Notes (Signed)
[place] for Heparin Indication: pulmonary embolus  Allergies  Allergen Reactions  . Ivp Dye [Iodinated Diagnostic Agents] Anaphylaxis    Can be pre-treated with Benadryl  . Metrizamide Anaphylaxis  . Other Other (See Comments)    Under no circumstances will the patient agree to a PICC line  . Reglan [Metoclopramide] Anaphylaxis  . Shellfish-Derived Products Anaphylaxis  . Codeine Hives  . Lidocaine Other (See Comments) and Rash    Pt not sure if this is an actual allergy - might have been a one time incident  . Methadone Hives  . Morphine Swelling and Rash    Local reaction to IV being pushed too fast  . Propoxyphene Hives  . Toradol [Ketorolac Tromethamine] Itching    Patient having systemic itching after receiving IV dose  . Iodine Rash    Reports localized reaction at IV site. Able to tolerate with Benadryl.   . Tape Rash  . Vancomycin Rash    Has had vancomycin since this reaction and had no reaction at all    Patient Measurements: Height: 5\' 10"  (177.8 cm) Weight: 81.8 kg (180 lb 5.4 oz) IBW/kg (Calculated) : 73 Heparin Dosing Weight: 79.4 kg  Vital Signs: Temp: 98.9 F (37.2 C) (11/15 1508) Temp Source: Oral (11/15 1508) BP: 154/90 (11/15 1508) Pulse Rate: 87 (11/15 1508)  Labs: Recent Labs    01/28/20 0347 01/28/20 0347 01/28/20 0807 01/28/20 1243 01/28/20 2221 01/29/20 0302 01/29/20 0302 01/29/20 1038 01/30/20 0604 01/30/20 0605 01/30/20 1832  HGB 7.1*   < >  --  7.8*  --  8.1*  --   --  7.6*  --   --   HCT 22.7*   < >  --  25.9*  --  26.3*  --   --  24.7*  --   --   PLT 313  --   --   --   --  369  --   --  368  --   --   HEPARINUNFRC <0.10*   < >  --  0.41   < > 0.52   < > 0.41  --  0.10* 0.52  CREATININE  --   --  0.78  --   --  0.87  --   --  0.68  --   --    < > = values in this interval not displayed.    Estimated Creatinine Clearance: 112.8 mL/min (by C-G formula based on SCr of 0.68  mg/dL).   Medical History: Past Medical History:  Diagnosis Date  . Allergy   . Anxiety   . Arthritis    left shoulder  . Bronchitis   . Chest pain 07/14/2016  . Complication of anesthesia   . DJD (degenerative joint disease) 11/29/2011  . Hypertension   . Neuromuscular disorder ([place])    carpal tunnel bilateral, ulner nerve surgery  . PONV (postoperative nausea and vomiting)   . Septic arthritis of knee, right ([place]) 11/29/2011  . Spinal headache    "long time ago"   Assessment: 54 yo male s/p 3 I&D L knee and L anterior thigh abscess. Pharmacy consulted 10/30 to switch IV heparin to SubQ enoxaparin for possible pulmonary embolism due to difficult IV access, need for central line placement and CTA.   10/31: CTA showed bilateral non-occlusive PE. Pharmacy now consulted to switch back to heparin with plans to return to OR for repeat I&D.   11/12: Heparin was infusion down to 1900 units/hr  and heparin level was slightly subtherapeutic at 0.27. The pharmacist discussed with the MD who said it was okay to keep at a lower range given bleeding. The hematoma was expanding and the patient received excisional debridement of the L knee w/ antibiotic beads on 11/12. When the patient returned from the OR 11/12, the heparin drip was running. Dr. Sharol Given elected to hold the heparin overnight and restart the next morning.  11/13: Per Dr. Sharol Given, the heparin drip was turned off overnight and was to resume in the morning at 0800. Hgb this morning was down to 7.1 from 8.1 but the MD wanted to continue the heparin and transfuse for Hgb <7 as needed.  11/15: Heparin level was subtherapeutic this morning. Heparin level of 0.52 this evening on increased rate of heparin 1900 units/hr is slightly above targeted range of 0.3-0.5 units/mL. Per RN no reported bleeding.   Goal of Therapy:   Heparin level 0.3-0.5 units/mL Monitor platelets by anticoagulation protocol: Yes   Plan:  Decrease heparin to 1850 units/hr   Check heparin level at 0200 Monitor daily CBC and heparin level Monitor signs and symptoms of bleeding  Cristela Felt, PharmD Clinical Pharmacist  01/30/2020 7:08 PM  Please check [email or web address] for unit-specific pharmacy phone numbers.

## 2020-01-30 NOTE — Care Management Important Message (Signed)
Important Message  Patient Details  Name: Jason [name] MRN: [id number] Date of Birth: 26-Dec-1965   Medicare Important Message Given:  Yes     [name] 01/30/2020, 11:16 AM

## 2020-01-30 NOTE — Progress Notes (Signed)
ANTICOAGULATION CONSULT NOTE - Follow Up Consult  Pharmacy Consult for heparin Indication: pulmonary embolus   Labs: Recent Labs    01/28/20 0347 01/28/20 0347 01/28/20 0807 01/28/20 1243 01/28/20 2221 01/29/20 0302 01/29/20 1038 01/30/20 0604 01/30/20 0605  HGB 7.1*   < >  --  7.8*  --  8.1*  --  7.6*  --   HCT 22.7*   < >  --  25.9*  --  26.3*  --  24.7*  --   PLT 313  --   --   --   --  369  --  368  --   HEPARINUNFRC <0.10*   < >  --  0.41   < > 0.52 0.41  --  0.10*  CREATININE  --   --  0.78  --   --  0.87  --  0.68  --    < > = values in this interval not displayed.    Assessment: 54yo male now subtherapeutic on heparin after one level at goal; no gtt issues or signs of bleeding per RN.  Goal of Therapy:  Heparin level 0.3-0.5 units/ml   Plan:  Will increase heparin gtt by ~1 unit/kg/hr to 1900 units/hr and check level in 6 hours.    Wynona Neat, PharmD, BCPS  01/30/2020,6:51 AM

## 2020-01-30 NOTE — Progress Notes (Signed)
PROGRESS NOTE    Jason [name]  KXF:[id number] DOB: February 22, 1966 DOA: 01/11/2020 PCP: Patient, No Pcp Per   Brief Narrative: Jason [name] is a 54 y.o. male with medical history significant of  Sp AKA, septir arthritis of left knee, h/o opioid dependence,documented Munchausen syndrome. Patient presented secondary to LOC, chest pain and knee pain. Syncope workup in addition to management of septic arthritis.   Assessment & Plan:   Principal Problem:   Bilateral pulmonary embolism (HCC) Active Problems:   Essential hypertension   S/P AKA (above knee amputation) unilateral, right (HCC)   Other chronic pain   Septic arthritis (HCC)   Syncope   Elevated troponin   Chest pain   Bright red blood per rectum   Anemia   Encounter for central line placement   Osteomyelitis of left knee region [facility])   Chest pain Present on admission and has improved. Cardiology consulted. HS troponin elevated: 77>93>68. Elevated D-dimer of 9.72 and Transthoracic Echocardiogram significant for RV dilation with concern for possible PE. No chest pain currently. On room air. Negative venous duplex for DVT. PE confirmed on CTA chest. Chest pain improved with treatment of PEs.  Bilateral PEs Submassive PE Non-occlusive on CTA. No heart strain per CT but initial Transthoracic Echocardiogram with moderate RV dilation and reduced function. Repeat Transthoracic Echocardiogram significant for improved RV. Pulmonology consulted and recommended to continue IV heparin with eventual transition to NOAC. -Continue Heparin IV; may need to consider IV filter if unable to control bleeding.  Syncope and collapse Likely secondary to PE.  -Management above  Septic arthritis Recurring issue. Per chart review, patient was previously managed at [facility] with concern patient may have Munchausen and is possibly inoculating himself. Previous cultures (10/3) from OSH significant for significant for strep  mitis/oralis/anginosus/parasnguinis with blood culture significant for strep anginosus/strepp salivarius/gemella haemolysans (as mentioned in H&P) with susceptibilities to penicillin. Prevotella denticola isolated on this admission. Orthopedic surgery consulted and patient underwent I&D on 10/28 in the OR with vancomycin/tobramycin cement beads placed. Arthrotomy and excisional debridement performed on 11/3 with vancomycin/gentamicin stimulant antibiotic bead placement. Started on Unasyn IV per ID. Excisional debridement performed on 11/12. -ID recommendations: Unasyn IV x6 weeks with end date of 02/29/2020 -Orthopedic surgery recommendations: pain control, brace while ambulating  Bright red blood per rectum FOBT negative x1. Hemoglobin tended down slightly from admission. No recurrence.  Acute on chronic Anemia Likely related to large and ongoing hematoma of left knee. Baseline appears to be between 8-9. Worsened hemoglobin with a low of 6.5 on 10/29. Patient has received about 5.5 units of PRBC to date. Patient with large hematoma of wound found on 11/10 which is likely contributing to continued anemia. Complicated by heparin drip. Hemoglobin stabilized with slight downtrend this morning. -Daily CBC  Elevated temperature Nursing obtained temperature with an elevated reading of 101.5 F. Repeat of 98.6 F without Tylenol. Resolved.   DVT prophylaxis: SCDs Code Status:   Code Status: Full Code Family Communication: None at bedside Disposition Plan: Discharge to SNF pending transition to NOAC once hemoglobin is stable possibly in 1-2 days   Consultants:   Orthopedic surgery  Cardiology  Pulmonology  Infectious  disease  Procedures:   INCISION & DRAINAGE (10/28) 1. Incision and drainage of left septic knee 2. Incision and drainage of left anterior thigh abscess 3. Placement of antibiotic cement  4. Incisional wound vac placement   TRANSTHORACIC ECHOCARDIOGRAM  (10/29) IMPRESSIONS    1. The RV is severely  dilated with moderately reduced function. Would  consider acute PE to explain tachycardia, syncope, and elevated troponin.  2. Left ventricular ejection fraction, by estimation, is 60 to 65%. The  left ventricle has normal function. The left ventricle has no regional  wall motion abnormalities. Left ventricular diastolic parameters were  normal. There is the interventricular  septum is flattened in systole, consistent with right ventricular pressure  overload.  3. Right ventricular systolic function is moderately reduced. The right  ventricular size is severely enlarged.  4. The mitral valve is grossly normal. Trivial mitral valve  regurgitation. No evidence of mitral stenosis.  5. The aortic valve is tricuspid. Aortic valve regurgitation is not  visualized. No aortic stenosis is present.   Conclusion(s)/Recommendation(s): Findings consistent with Cor Pulmonale.   TRANSTHORACIC ECHOCARDIOGRAM (10/31) IMPRESSIONS    1. Left ventricular ejection fraction, by estimation, is 60 to 65%. The  left ventricle has normal function. The left ventricle has no regional  wall motion abnormalities.  2. Right ventricular systolic function is mildly reduced. The right  ventricular size is mildly enlarged.   Comparison(s): The right ventricular systolic function has improved.   IRRIGATION AND DEBRIDEMENT (11/3)  EXCISIONAL DEBRIDEMENT (11/12)  Antimicrobials:  Unasyn   Subjective: Knee pain.  Objective: Vitals:   01/29/20 1619 01/29/20 1939 01/30/20 0553 01/30/20 0808  BP: 119/68  (!) 150/78 (!) 162/97  Pulse: 99  74 74  Resp:   11 11  Temp: 98.9 F (37.2 C) 98.7 F (37.1 C) 98.2 F (36.8 C) 99 F (37.2 C)  TempSrc:  Oral  Oral  SpO2: 100%  98% 100%  Weight:      Height:        Intake/Output Summary (Last 24 hours) at 01/30/2020 0958 Last data filed at 01/30/2020 0557 Gross per 24 hour  Intake 2409.5 ml  Output 3650  ml  Net -1240.5 ml   Filed Weights   01/23/20 0458 01/24/20 0334 01/29/20 0506  Weight: 84 kg 83.3 kg 81.8 kg    Examination:  General exam: Appears calm and comfortable Respiratory system: Clear to auscultation. Respiratory effort normal. Cardiovascular system: S1 & S2 heard, RRR. No murmurs, rubs, gallops or clicks. Gastrointestinal system: Abdomen is nondistended, soft and nontender. No organomegaly or masses felt. Normal bowel sounds heard. Central nervous system: Alert and oriented. No focal neurological deficits. Musculoskeletal: No edema. Right AKA with left leg in ACE bandages. No calf tenderness Skin: No cyanosis. No rashes Psychiatry: Judgement and insight appear normal. Mood & affect appropriate.    Data Reviewed: I have personally reviewed following labs and imaging studies  CBC Lab Results  Component Value Date   WBC 8.6 01/30/2020   RBC 2.75 (L) 01/30/2020   HGB 7.6 (L) 01/30/2020   HCT 24.7 (L) 01/30/2020   MCV 89.8 01/30/2020   MCH 27.6 01/30/2020   PLT 368 01/30/2020   MCHC 30.8 01/30/2020   RDW 18.2 (H) 01/30/2020   LYMPHSABS 0.5 (L) 01/21/2020   MONOABS 0.2 01/21/2020   EOSABS 0.0 01/21/2020   BASOSABS 0.0 09/73/5329     Last metabolic panel Lab Results  Component Value Date   NA 138 01/30/2020   K 3.7 01/30/2020   CL 106 01/30/2020   CO2 26 01/30/2020   BUN 6 01/30/2020   CREATININE 0.68 01/30/2020   GLUCOSE 104 (H) 01/30/2020   GFRNONAA >60 01/30/2020   GFRAA >60 08/18/2018   CALCIUM 7.9 (L) 01/30/2020   PHOS 3.7 01/18/2020   PROT 5.7 (L) 01/27/2020  ALBUMIN 2.1 (L) 01/27/2020   LABGLOB 2.6 08/14/2017   AGRATIO 1.9 08/14/2017   BILITOT 0.7 01/27/2020   ALKPHOS 101 01/27/2020   AST 14 (L) 01/27/2020   ALT 10 01/27/2020   ANIONGAP 6 01/30/2020    CBG (last 3)  No results for input(s): GLUCAP in the last 72 hours.   GFR: Estimated Creatinine Clearance: 112.8 mL/min (by C-G formula based on SCr of 0.68 mg/dL).  Coagulation  Profile: No results for input(s): INR, PROTIME in the last 168 hours.  No results found for this or any previous visit (from the past 240 hour(s)).      Radiology Studies: No results found.      Scheduled Meds: . sodium chloride   Intravenous Once  . sodium chloride   Intravenous Once  . acetaminophen  1,000 mg Oral Q6H  . Chlorhexidine Gluconate Cloth  6 each Topical Daily  . docusate sodium  100 mg Oral BID  . gabapentin  300 mg Oral TID  . methocarbamol  1,000 mg Oral Q8H  . metoprolol tartrate  12.5 mg Oral BID  . potassium chloride  40 mEq Oral Daily  . sodium chloride flush  10-40 mL Intracatheter Q12H  . sodium chloride flush  20 mL Intracatheter Q12H   Continuous Infusions: . sodium chloride 100 mL/hr at 01/27/20 0415  . sodium chloride 20 mL/hr at 01/30/20 0954  . ampicillin-sulbactam (UNASYN) IV 3 g (01/30/20 0955)  . heparin 1,900 Units/hr (01/30/20 0651)  . methocarbamol (ROBAXIN) IV       LOS: 19 days     Cordelia Poche, MD [facility] 01/30/2020, 9:58 AM  If 7PM-7AM, please contact night-coverage [email or web address]

## 2020-01-30 NOTE — Progress Notes (Signed)
Physical Therapy Treatment Patient Details Name: Jason [name] MRN: [id number] DOB: [birth year] Today's Date: 01/30/2020    History of Present Illness Pt adm lt septic knee arthritis and lt distal femur/proximal tibia osteomyelitis. On 10/28 underwent I&D of lt knee and lt anterior thigh abscess and placement of antibiotic beads and cement. On 10/30 pt found to have bil PE. On 11/3 pt underwent Arthrotomy left knee and excisional debridement soft tissue and capsule, Bony resection of the distal femur and proximal tibia resected for osteomyelitis. and placement of incisional wound VAC. PMH - Rt AKA, rt thr, Munchausen syndrome, HTN, anxiety, arthritis, multiple shoulder surgeries.     PT Comments    Pt supine in bed on arrival this session.  Intially adamant he was not moving out of bed.  After PTA provided education and encouragement he was agreeable to stand EOB during linen change and pericare.  Pt continues to require snf placement at d/c.     Follow Up Recommendations  SNF (If patient refuses then will need HHPT)     Equipment Recommendations  None recommended by PT    Recommendations for Other Services       Precautions / Restrictions Precautions Precautions: Fall Required Braces or Orthoses: Knee Immobilizer - Left Knee Immobilizer - Left: On at all times Restrictions Weight Bearing Restrictions: Yes LLE Weight Bearing: Weight bearing as tolerated (with knee immobilizer donned.) Other Position/Activity Restrictions: wound vac L knee.    Mobility  Bed Mobility Overal bed mobility: Needs Assistance Bed Mobility: Supine to Sit;Sit to Supine;Rolling Rolling: Supervision   Supine to sit: Min assist Sit to supine: Min assist   General bed mobility comments: Min assistance to move to edge of bed and back to bed supporting LLE.  Transfers Overall transfer level: Needs assistance Equipment used: Rolling walker (2 wheeled) Transfers: Sit to/from Stand Sit to Stand: Mod  assist         General transfer comment: Mod assistance to rise into standing.  Elevated patient's bed so he could push through B hand grips on RW to achieve standing.  Pt stood for 15-20 secs total.  Ambulation/Gait Ambulation/Gait assistance:  (NT)               Stairs             Wheelchair Mobility    Modified Rankin (Stroke Patients Only)       Balance Overall balance assessment: Needs assistance Sitting-balance support: Bilateral upper extremity supported Sitting balance-Leahy Scale: Fair       Standing balance-Leahy Scale: Poor Standing balance comment: B UE support and external assistance.                            Cognition Arousal/Alertness: Awake/alert Behavior During Therapy: WFL for tasks assessed/performed Overall Cognitive Status: Within Functional Limits for tasks assessed                                 General Comments: hx of suspected Munchausen disorder.      Exercises      General Comments        Pertinent Vitals/Pain Pain Assessment: 0-10 Pain Score: 7  Pain Location: LLE Pain Descriptors / Indicators: Sharp Pain Intervention(s): Monitored during session;Repositioned    Home Living  Prior Function            PT Goals (current goals can now be found in the care plan section) Acute Rehab PT Goals Patient Stated Goal: to keep working with therapy PT Goal Formulation: With patient Potential to Achieve Goals: Fair Progress towards PT goals: Progressing toward goals    Frequency    Min 3X/week      PT Plan Current plan remains appropriate    Co-evaluation              AM-PAC PT "6 Clicks" Mobility   Outcome Measure  Help needed turning from your back to your side while in a flat bed without using bedrails?: A Little Help needed moving from lying on your back to sitting on the side of a flat bed without using bedrails?: A Little Help needed moving  to and from a bed to a chair (including a wheelchair)?: A Lot Help needed standing up from a chair using your arms (e.g., wheelchair or bedside chair)?: Total Help needed to walk in hospital room?: Total Help needed climbing 3-5 steps with a railing? : Total 6 Click Score: 11    End of Session Equipment Utilized During Treatment: Gait belt Activity Tolerance: Patient tolerated treatment well Patient left: in bed;with call bell/phone within reach;with nursing/sitter in room Nurse Communication: Mobility status PT Visit Diagnosis: Other abnormalities of gait and mobility (R26.89);Pain Pain - Right/Left: Left Pain - part of body: Knee;Leg     Time: 6384-6659 PT Time Calculation (min) (ACUTE ONLY): 24 min  Charges:  $Therapeutic Activity: 23-37 mins                     Jason [name] , PTA Acute Rehabilitation Services Pager [phone] Office [phone]     Jason [name] 01/30/2020, 3:12 PM

## 2020-01-31 LAB — BASIC METABOLIC PANEL
Anion gap: 13 (ref 5–15)
BUN: 7 mg/dL (ref 6–20)
CO2: 23 mmol/L (ref 22–32)
Calcium: 9.1 mg/dL (ref 8.9–10.3)
Chloride: 102 mmol/L (ref 98–111)
Creatinine, Ser: 0.86 mg/dL (ref 0.61–1.24)
GFR, Estimated: >60 mL/min (ref >60)
Glucose, Bld: 119 mg/dL — ABNORMAL HIGH (ref 70–99)
Potassium: 4.1 mmol/L (ref 3.5–5.1)
Sodium: 138 mmol/L (ref 135–145)

## 2020-01-31 LAB — CBC
HCT: 31.3 % — ABNORMAL LOW (ref 39.0–52.0)
Hemoglobin: 9.5 g/dL — ABNORMAL LOW (ref 13.0–17.0)
MCH: 26.5 pg (ref 26.0–34.0)
MCHC: 30.4 g/dL (ref 30.0–36.0)
MCV: 87.4 fL (ref 80.0–100.0)
Platelets: 441 10*3/uL — ABNORMAL HIGH (ref 150–400)
RBC: 3.58 MIL/uL — ABNORMAL LOW (ref 4.22–5.81)
RDW: 18 % — ABNORMAL HIGH (ref 11.5–15.5)
WBC: 10.5 10*3/uL (ref 4.0–10.5)
nRBC: 0.6 % — ABNORMAL HIGH (ref 0.0–0.2)

## 2020-01-31 LAB — HEPARIN LEVEL (UNFRACTIONATED): Heparin Unfractionated: 0.37 IU/mL (ref 0.30–0.70)

## 2020-01-31 MED ORDER — APIXABAN 5 MG PO TABS
5.0000 mg | ORAL_TABLET | Freq: Two times a day (BID) | ORAL | Status: DC
  Administered 2020-01-31 – 2020-02-01 (×3): 5 mg via ORAL
  Filled 2020-01-31 (×3): qty 1

## 2020-01-31 NOTE — TOC Progression Note (Signed)
Transition of Care [facility]) - Progression Note    Patient Details  Name: SASUKE YAFFE MRN: [id number] Date of Birth: Nov 20, 1965  Transition of Care [facility]) CM/SW [place], [place] Phone Number: 01/31/2020, 1:42 PM  Clinical Narrative:     CSW spoke with patient at bedside. CSW provided bed offers to patient. Patient chose SNF placement at [facility] in [place]. Christie with [facility] confirmed they can accept patient for SNF placement.  Patient has SNF bed at [facility].   CSW will continue to follow.  Expected Discharge Plan: [place] Barriers to Discharge: Continued Medical Work up  Expected Discharge Plan and Services Expected Discharge Plan: [place]   Discharge Planning Services: CM Consult Post Acute Care Choice: Home Health (has wheelchair) Living arrangements for the past 2 months: [place]: PT, OT [place] Agency: [place] Date [place]: 01/16/20 Time [place]: 1052 Representative spoke with at [place]: Adela Lank   Social Determinants of Health ([place]) Interventions    Readmission Risk Interventions No flowsheet data found.

## 2020-01-31 NOTE — Progress Notes (Addendum)
Initial Nutrition Assessment  DOCUMENTATION CODES:   Not applicable  INTERVENTION:   Liberalize diet to regular.  [employer] Breakfast Essentials BID with lunch and dinner meals, each supplement prepared with 2% milk will provide 260 kcal and 13 gm protein.  Snacks BID between meals.  NUTRITION DIAGNOSIS:   Increased nutrient needs related to wound healing, acute illness as evidenced by estimated needs.  GOAL:   Patient will meet greater than or equal to 90% of their needs  MONITOR:   PO intake, Supplement acceptance, Labs, Skin  REASON FOR ASSESSMENT:   LOS    ASSESSMENT:   54 yo male admitted with L knee septic joint infection S/P fall at home while doing PT excercises. PMH includes R AKA, L knee septic arthritis, opioid dependence, Munchausen sydrome.   S/P I&D of L knee with VAC placement 10/28, 11/3, and 11/12.  VAC in place to L knee incision. 0 ml drainage.  Patient reports that he has been eating okay. He states that he usually does not eat much at home. He does not like the taste of the hospital food. He does not like the taste of any PO supplements. He likes 2% milk and agreed to try [employer] Essentials BID. Will also add snacks BID between meals to maximize intake of protein and calories. He never eats breakfast at home and has not been eating breakfast here in the hospital either. He has been eating one or two meals per day. Unsure of his usual weight.  Plans for d/c to SNF for rehab and antibiotics.   Labs reviewed.   Medications reviewed and include Colace, potassium chloride.  Patient weighed 86.6 kg on admission, down to 79.3 kg today. Likely partially related to fluids with decreased swelling since admission.   NUTRITION - FOCUSED PHYSICAL EXAM:    Most Recent Value  Orbital Region No depletion  Upper Arm Region No depletion  Thoracic and Lumbar Region No depletion  Buccal Region No depletion  Temple Region No depletion  Clavicle  Bone Region No depletion  Clavicle and Acromion Bone Region No depletion  Scapular Bone Region No depletion  Dorsal Hand No depletion  Patellar Region Unable to assess  Anterior Thigh Region Unable to assess  Posterior Calf Region Unable to assess  Edema (RD Assessment) Unable to assess  Hair Reviewed  Eyes Reviewed  Mouth Reviewed  Skin Reviewed  Nails Reviewed       Diet Order:   Diet Order            Diet regular Room service appropriate? Yes; Fluid consistency: Thin  Diet effective now                 EDUCATION NEEDS:   Education needs have been addressed  Skin:  Skin Assessment: Skin Integrity Issues: Skin Integrity Issues:: Wound VAC Wound Vac: L knee incision  Last BM:  11/15  Height:   Ht Readings from Last 1 Encounters:  01/18/20 5\' 10"  (1.778 m)    Weight:   Wt Readings from Last 1 Encounters:  01/31/20 79.3 kg    Ideal Body Weight:  69.5 kg  BMI:  27.3 (adjusted for AKA)  Estimated Nutritional Needs:   Kcal:  2100-2300  Protein:  100-120 gm  Fluid:  >/= 2.1 L    Lucas Mallow, RD, LDN, CNSC Please refer to Amion for contact information.

## 2020-01-31 NOTE — Progress Notes (Signed)
ANTICOAGULATION CONSULT NOTE  Pharmacy Consult for Heparin Indication: pulmonary embolus  Assessment: 54 yo male s/p 3 I&D L knee and L anterior thigh abscess. Pharmacy consulted 10/30 to switch IV heparin to SubQ enoxaparin for possible pulmonary embolism due to difficult IV access, need for central line placement and CTA.   10/31: CTA showed bilateral non-occlusive PE. Pharmacy now consulted to switch back to heparin with plans to return to OR for repeat I&D.   11/12: Heparin was infusion down to 1900 units/hr and heparin level was slightly subtherapeutic at 0.27. The pharmacist discussed with the MD who said it was okay to keep at a lower range given bleeding. The hematoma was expanding and the patient received excisional debridement of the L knee w/ antibiotic beads on 11/12. When the patient returned from the OR 11/12, the heparin drip was running. Dr. Sharol Given elected to hold the heparin overnight and restart the next morning.  11/13: Per Dr. Sharol Given, the heparin drip was turned off overnight and was to resume in the morning at 0800. Hgb this morning was down to 7.1 from 8.1 but the MD wanted to continue the heparin and transfuse for Hgb <7 as needed.  11/15: Heparin level was subtherapeutic this morning. Heparin level of 0.52 this evening on increased rate of heparin 1900 units/hr is slightly above targeted range of 0.3-0.5 units/mL. Per RN no reported bleeding.   11/16:  Heparin level 0.37 units/ml  Goal of Therapy:   Heparin level 0.3-0.5 units/mL Monitor platelets by anticoagulation protocol: Yes   Plan:  Continue heparin at 1850 units/hr  Monitor daily CBC and heparin level Monitor signs and symptoms of bleeding  Thanks for allowing pharmacy to be a part of this patient's care.  Excell Seltzer, PharmD Clinical Pharmacist

## 2020-01-31 NOTE — Discharge Instructions (Signed)

## 2020-01-31 NOTE — Progress Notes (Signed)
OT Cancellation Note  Patient Details Name: RAKESH DUTKO MRN: [id number] DOB: July 11, 1965   Cancelled Treatment:    Reason Eval/Treat Not Completed: Patient declined, no reason specified;Pain limiting ability to participate (Pt reporting pain around L knee. Pt refusing OT at this time)   OT to continue to follow.  Jefferey Pica, OTR/L Acute Rehabilitation Services Pager: [phone] Office: [phone]   Jefferey Pica 01/31/2020, 11:43 AM

## 2020-01-31 NOTE — Progress Notes (Signed)
Physical Therapy Treatment Patient Details Name: Jason [name] MRN: [id number] DOB: [birth year] Today's Date: 01/31/2020    History of Present Illness Pt adm lt septic knee arthritis and lt distal femur/proximal tibia osteomyelitis. On 10/28 underwent I&D of lt knee and lt anterior thigh abscess and placement of antibiotic beads and cement. On 10/30 pt found to have bil PE. On 11/3 pt underwent Arthrotomy left knee and excisional debridement soft tissue and capsule, Bony resection of the distal femur and proximal tibia resected for osteomyelitis. and placement of incisional wound VAC. PMH - Rt AKA, rt thr, Munchausen syndrome, HTN, anxiety, arthritis, multiple shoulder surgeries.     PT Comments    Pt supine in bed on arrival this session.  Pt refused OOB mobility as he reports increased pain.  He was agreeable to move edge of bed and perform seated hip flexion.  Pt assisted in donning and doffing brace as he continues to lack ability to perform independently.  Educated on importance of knee extension and keeping toes pointed to ceiling.  Pt present with brace off in bed and L hip ER.     Follow Up Recommendations  SNF (If patient refused will need HHPT.)     Equipment Recommendations  None recommended by PT    Recommendations for Other Services       Precautions / Restrictions Precautions Precautions: Fall Required Braces or Orthoses: Knee Immobilizer - Left Knee Immobilizer - Left: On at all times Restrictions Weight Bearing Restrictions: No LLE Weight Bearing: Weight bearing as tolerated Other Position/Activity Restrictions: wound vac L knee.    Mobility  Bed Mobility Overal bed mobility: Needs Assistance Bed Mobility: Supine to Sit;Sit to Supine     Supine to sit: Supervision Sit to supine: Supervision   General bed mobility comments: Pt able to move to edge of bed unsupported and back to bed unsupported this session.  Transfers                 General  transfer comment: Pt refused transfers this session.  Ambulation/Gait                 Stairs             Wheelchair Mobility    Modified Rankin (Stroke Patients Only)       Balance Overall balance assessment: Needs assistance Sitting-balance support: Bilateral upper extremity supported Sitting balance-Leahy Scale: Fair Sitting balance - Comments: sitting edge of bed.  At time required min guard assistance.       Standing balance comment: not assessed this session.                            Cognition Arousal/Alertness: Awake/alert Behavior During Therapy: WFL for tasks assessed/performed Overall Cognitive Status: Within Functional Limits for tasks assessed                                 General Comments: documented Munchausen disorder.  Pt more motivated but continues to self limit progress.      Exercises Other Exercises Other Exercises: Seated L hip flexion with knee immobilizer donned to maintain precautions. 1x 10 reps.    General Comments        Pertinent Vitals/Pain Pain Assessment: 0-10 Pain Score: 6  Pain Location: L Knee - posterior/medial Pain Descriptors / Indicators: Sharp Pain Intervention(s): Monitored during session;Repositioned    Home  Living                      Prior Function            PT Goals (current goals can now be found in the care plan section) Acute Rehab PT Goals Patient Stated Goal: to keep working with therapy Potential to Achieve Goals: Fair Progress towards PT goals: Progressing toward goals    Frequency    Min 3X/week      PT Plan Current plan remains appropriate    Co-evaluation              AM-PAC PT "6 Clicks" Mobility   Outcome Measure  Help needed turning from your back to your side while in a flat bed without using bedrails?: A Little Help needed moving from lying on your back to sitting on the side of a flat bed without using bedrails?: A  Little Help needed moving to and from a bed to a chair (including a wheelchair)?: A Lot Help needed standing up from a chair using your arms (e.g., wheelchair or bedside chair)?: Total Help needed to walk in hospital room?: Total Help needed climbing 3-5 steps with a railing? : Total 6 Click Score: 11    End of Session Equipment Utilized During Treatment: Gait belt Activity Tolerance: Patient tolerated treatment well Patient left: in bed;with call bell/phone within reach;with bed alarm set Nurse Communication: Mobility status PT Visit Diagnosis: Other abnormalities of gait and mobility (R26.89);Pain Pain - Right/Left: Left Pain - part of body: Knee;Leg     Time: 1545-1600 PT Time Calculation (min) (ACUTE ONLY): 15 min  Charges:  $Therapeutic Activity: 8-22 mins                     [name] , PTA Acute Rehabilitation Services Pager [phone] Office [phone]     Bensyn Bornemann Eli Hose 01/31/2020, 4:12 PM

## 2020-01-31 NOTE — Progress Notes (Signed)
The patient is postop day 4 status post repeat irrigation and debridement of his knee.  He is very comfortable lying in bed.  States he understands to use the knee immobilizer when he is up and about  Wound VAC is working with 2 green checks 0 cc of drainage no swelling at this time.  Will be discharged to skilled nursing antibiotics

## 2020-01-31 NOTE — Progress Notes (Addendum)
[place] for Heparin >> Apixaban  Indication: pulmonary embolus  Allergies  Allergen Reactions   Ivp Dye [Iodinated Diagnostic Agents] Anaphylaxis    Can be pre-treated with Benadryl   Metrizamide Anaphylaxis   Other Other (See Comments)    Under no circumstances will the patient agree to a PICC line   Reglan [Metoclopramide] Anaphylaxis   Shellfish-Derived Products Anaphylaxis   Codeine Hives   Lidocaine Other (See Comments) and Rash    Pt not sure if this is an actual allergy - might have been a one time incident   Methadone Hives   Morphine Swelling and Rash    Local reaction to IV being pushed too fast   Propoxyphene Hives   Toradol [Ketorolac Tromethamine] Itching    Patient having systemic itching after receiving IV dose   Iodine Rash    Reports localized reaction at IV site. Able to tolerate with Benadryl.    Tape Rash   Vancomycin Rash    Has had vancomycin since this reaction and had no reaction at all    Patient Measurements: Height: 5\' 10"  (177.8 cm) Weight: 79.3 kg (174 lb 13.2 oz) IBW/kg (Calculated) : 73 Heparin Dosing Weight: 79.4 kg  Vital Signs: Temp: 98.6 F (37 C) (11/16 0739) Temp Source: Oral (11/16 0739) BP: 132/75 (11/16 0739) Pulse Rate: 88 (11/16 0739)  Labs: Recent Labs    01/29/20 0302 01/29/20 1038 01/30/20 0604 01/30/20 0605 01/30/20 1832 01/31/20 0447  HGB 8.1*  --  7.6*  --   --  9.5*  HCT 26.3*  --  24.7*  --   --  31.3*  PLT 369  --  368  --   --  441*  HEPARINUNFRC 0.52   < >  --  0.10* 0.52 0.37  CREATININE 0.87  --  0.68  --   --  0.86   < > = values in this interval not displayed.    Estimated Creatinine Clearance: 104.9 mL/min (by C-G formula based on SCr of 0.86 mg/dL).   Medical History: Past Medical History:  Diagnosis Date   Allergy    Anxiety    Arthritis    left shoulder   Bronchitis    Chest pain 2/35/3614   Complication of anesthesia    DJD (degenerative joint  disease) 11/29/2011   Hypertension    Neuromuscular disorder (HCC)    carpal tunnel bilateral, ulner nerve surgery   PONV (postoperative nausea and vomiting)    Septic arthritis of knee, right ([place]) 11/29/2011   Spinal headache    "long time ago"   Assessment: 54 yo male s/p 3 I&D L knee and L anterior thigh abscess. Pharmacy consulted 10/30 to switch IV heparin to SubQ enoxaparin for pulmonary embolism due to difficult IV access, need for central line placement and CTA. CTA showed bilateral non-occlusive PE.  Pharmacy consulted to switch back to and continue with heparin during hospitalization.   11/16: Heparin level was therapeutic this morning (0.37). Pharmacy consulted to transition to rivaroxaban(Xarelto). Hgb increased (9.5) and PLT increased (441).   After discussion with primary team will start apixaban, patient has been consistently on IV heparin since 10/29 so the initial 7 day loading dose was complete with parenteral anticoagulation. Will start standard maintenance dose today.   Goal of Therapy:   Monitor platelets by anticoagulation protocol: Yes   Plan:  -Stop heparin this am -Apixaban 5mg  bid -Education prior to discharge   Tivis Ringer PharmD Candidate 01/31/2020 11:38  AM  Please check [email or web address] for unit-specific pharmacy phone numbers.  Erin Hearing PharmD., BCPS Clinical Pharmacist 01/31/2020 1:32 PM

## 2020-01-31 NOTE — Progress Notes (Addendum)
PROGRESS NOTE    Jason [name]  EPP:[id number] DOB: Dec 05, 1965 DOA: 01/11/2020 PCP: Patient, No Pcp Per   Brief Narrative: Jason [name] is a 54 y.o. male with medical history significant of  Sp AKA, septir arthritis of left knee, h/o opioid dependence,documented Munchausen syndrome. Patient presented secondary to LOC, chest pain and knee pain. Syncope workup in addition to management of septic arthritis.   Assessment & Plan:   Principal Problem:   Bilateral pulmonary embolism (HCC) Active Problems:   Essential hypertension   S/P AKA (above knee amputation) unilateral, right (HCC)   Other chronic pain   Septic arthritis (HCC)   Syncope   Elevated troponin   Chest pain   Bright red blood per rectum   Anemia   Encounter for central line placement   Osteomyelitis of left knee region [facility])   Chest pain Present on admission and has improved. Cardiology consulted. HS troponin elevated: 77>93>68. Elevated D-dimer of 9.72 and Transthoracic Echocardiogram significant for RV dilation with concern for possible PE. No chest pain currently. On room air. Negative venous duplex for DVT. PE confirmed on CTA chest. Chest pain improved with treatment of PEs.  Bilateral PEs Submassive PE Non-occlusive on CTA. No heart strain per CT but initial Transthoracic Echocardiogram with moderate RV dilation and reduced function. Repeat Transthoracic Echocardiogram significant for improved RV. Pulmonology consulted and recommended to continue IV heparin with eventual transition to NOAC. Hemoglobin stable. -Eliquis  Syncope and collapse Likely secondary to PE.  -Management above  Septic arthritis Recurring issue. Per chart review, patient was previously managed at [facility] with concern patient may have Munchausen and is possibly inoculating himself. Previous cultures (10/3) from OSH significant for significant for strep mitis/oralis/anginosus/parasnguinis with blood culture significant  for strep anginosus/strepp salivarius/gemella haemolysans (as mentioned in H&P) with susceptibilities to penicillin. Prevotella denticola isolated on this admission. Orthopedic surgery consulted and patient underwent I&D on 10/28 in the OR with vancomycin/tobramycin cement beads placed. Arthrotomy and excisional debridement performed on 11/3 with vancomycin/gentamicin stimulant antibiotic bead placement. Started on Unasyn IV per ID. Excisional debridement performed on 11/12. -ID recommendations: Unasyn IV x6 weeks with end date of 02/29/2020 -Orthopedic surgery recommendations: pain control, brace while ambulating -Scheduled Tylenol -Oxycodone prn; dilaudid prn with wean to discontinue  Bright red blood per rectum FOBT negative x1. Hemoglobin tended down slightly from admission. No recurrence.  Acute on chronic Anemia Likely related to large and ongoing hematoma of left knee. Baseline appears to be between 8-9. Worsened hemoglobin with a low of 6.5 on 10/29. Patient has received about 5.5 units of PRBC to date. Patient with large hematoma of wound found on 11/10 which is likely contributing to continued anemia. Complicated by heparin drip. Hemoglobin stabilized.  Elevated temperature Nursing obtained temperature with an elevated reading of 101.5 F. Repeat of 98.6 F without Tylenol. Resolved.   DVT prophylaxis: Eliquis Code Status:   Code Status: Full Code Family Communication: None at bedside Disposition Plan: Discharge to SNF pending bed availability and discontinuation of IV analgesics   Consultants:   Orthopedic surgery  Cardiology  Pulmonology  Infectious  disease  Procedures:   INCISION & DRAINAGE (10/28) 1. Incision and drainage of left septic knee 2. Incision and drainage of left anterior thigh abscess 3. Placement of antibiotic cement  4. Incisional wound vac placement   TRANSTHORACIC ECHOCARDIOGRAM (10/29) IMPRESSIONS    1. The RV is severely dilated with  moderately reduced function. Would  consider acute PE to explain  tachycardia, syncope, and elevated troponin.  2. Left ventricular ejection fraction, by estimation, is 60 to 65%. The  left ventricle has normal function. The left ventricle has no regional  wall motion abnormalities. Left ventricular diastolic parameters were  normal. There is the interventricular  septum is flattened in systole, consistent with right ventricular pressure  overload.  3. Right ventricular systolic function is moderately reduced. The right  ventricular size is severely enlarged.  4. The mitral valve is grossly normal. Trivial mitral valve  regurgitation. No evidence of mitral stenosis.  5. The aortic valve is tricuspid. Aortic valve regurgitation is not  visualized. No aortic stenosis is present.   Conclusion(s)/Recommendation(s): Findings consistent with Cor Pulmonale.   TRANSTHORACIC ECHOCARDIOGRAM (10/31) IMPRESSIONS    1. Left ventricular ejection fraction, by estimation, is 60 to 65%. The  left ventricle has normal function. The left ventricle has no regional  wall motion abnormalities.  2. Right ventricular systolic function is mildly reduced. The right  ventricular size is mildly enlarged.   Comparison(s): The right ventricular systolic function has improved.   IRRIGATION AND DEBRIDEMENT (11/3)  EXCISIONAL DEBRIDEMENT (11/12)  Antimicrobials:  Unasyn   Subjective: Patient reports continued knee pain.  Objective: Vitals:   01/31/20 0045 01/31/20 0355 01/31/20 0739 01/31/20 1238  BP: 134/82 134/86 132/75 (!) 142/87  Pulse: 86 88 88   Resp: 12 15 13    Temp: 98.4 F (36.9 C) 98.6 F (37 C) 98.6 F (37 C) 98.3 F (36.8 C)  TempSrc: Oral Oral Oral Oral  SpO2: 97% 97% 95%   Weight:  79.3 kg    Height:        Intake/Output Summary (Last 24 hours) at 01/31/2020 1304 Last data filed at 01/31/2020 1100 Gross per 24 hour  Intake 1276.84 ml  Output 1450 ml  Net -173.16 ml     Filed Weights   01/24/20 0334 01/29/20 0506 01/31/20 0355  Weight: 83.3 kg 81.8 kg 79.3 kg    Examination:  General exam: Appears calm and comfortable Respiratory system: Clear to auscultation. Respiratory effort normal. Cardiovascular system: S1 & S2 heard, RRR. No murmurs, rubs, gallops or clicks. Gastrointestinal system: Abdomen is nondistended, soft and nontender. No organomegaly or masses felt. Normal bowel sounds heard. Central nervous system: Alert and oriented. No focal neurological deficits. Musculoskeletal: Left leg with ACE bandage. Right AKA. No calf tenderness Skin: No cyanosis. No rashes Psychiatry: Judgement and insight appear normal. Mood & affect appropriate.   Data Reviewed: I have personally reviewed following labs and imaging studies  CBC Lab Results  Component Value Date   WBC 10.5 01/31/2020   RBC 3.58 (L) 01/31/2020   HGB 9.5 (L) 01/31/2020   HCT 31.3 (L) 01/31/2020   MCV 87.4 01/31/2020   MCH 26.5 01/31/2020   PLT 441 (H) 01/31/2020   MCHC 30.4 01/31/2020   RDW 18.0 (H) 01/31/2020   LYMPHSABS 0.5 (L) 01/21/2020   MONOABS 0.2 01/21/2020   EOSABS 0.0 01/21/2020   BASOSABS 0.0 78/24/2353     Last metabolic panel Lab Results  Component Value Date   NA 138 01/31/2020   K 4.1 01/31/2020   CL 102 01/31/2020   CO2 23 01/31/2020   BUN 7 01/31/2020   CREATININE 0.86 01/31/2020   GLUCOSE 119 (H) 01/31/2020   GFRNONAA >60 01/31/2020   GFRAA >60 08/18/2018   CALCIUM 9.1 01/31/2020   PHOS 3.7 01/18/2020   PROT 5.7 (L) 01/27/2020   ALBUMIN 2.1 (L) 01/27/2020   LABGLOB 2.6 08/14/2017  AGRATIO 1.9 08/14/2017   BILITOT 0.7 01/27/2020   ALKPHOS 101 01/27/2020   AST 14 (L) 01/27/2020   ALT 10 01/27/2020   ANIONGAP 13 01/31/2020    CBG (last 3)  No results for input(s): GLUCAP in the last 72 hours.   GFR: Estimated Creatinine Clearance: 104.9 mL/min (by C-G formula based on SCr of 0.86 mg/dL).  Coagulation Profile: No results for input(s):  INR, PROTIME in the last 168 hours.  No results found for this or any previous visit (from the past 240 hour(s)).      Radiology Studies: No results found.      Scheduled Meds: . sodium chloride   Intravenous Once  . sodium chloride   Intravenous Once  . acetaminophen  1,000 mg Oral Q6H  . Chlorhexidine Gluconate Cloth  6 each Topical Daily  . docusate sodium  100 mg Oral BID  . gabapentin  300 mg Oral TID  . methocarbamol  1,000 mg Oral Q8H  . metoprolol tartrate  12.5 mg Oral BID  . potassium chloride  40 mEq Oral Daily  . sodium chloride flush  10-40 mL Intracatheter Q12H  . sodium chloride flush  20 mL Intracatheter Q12H   Continuous Infusions: . sodium chloride 20 mL/hr at 01/31/20 0420  . ampicillin-sulbactam (UNASYN) IV 3 g (01/31/20 0804)  . heparin 1,850 Units/hr (01/31/20 0916)  . methocarbamol (ROBAXIN) IV       LOS: 20 days     Cordelia Poche, MD [facility] 01/31/2020, 1:04 PM  If 7PM-7AM, please contact night-coverage [email or web address]

## 2020-02-01 LAB — BASIC METABOLIC PANEL WITH GFR
Anion gap: 6 (ref 5–15)
BUN: 11 mg/dL (ref 6–20)
CO2: 27 mmol/L (ref 22–32)
Calcium: 9.1 mg/dL (ref 8.9–10.3)
Chloride: 105 mmol/L (ref 98–111)
Creatinine, Ser: 0.89 mg/dL (ref 0.61–1.24)
GFR, Estimated: >60 mL/min
Glucose, Bld: 121 mg/dL — ABNORMAL HIGH (ref 70–99)
Potassium: 4.2 mmol/L (ref 3.5–5.1)
Sodium: 138 mmol/L (ref 135–145)

## 2020-02-01 LAB — CBC
HCT: 30.2 % — ABNORMAL LOW (ref 39.0–52.0)
Hemoglobin: 8.9 g/dL — ABNORMAL LOW (ref 13.0–17.0)
MCH: 26.3 pg (ref 26.0–34.0)
MCHC: 29.5 g/dL — ABNORMAL LOW (ref 30.0–36.0)
MCV: 89.3 fL (ref 80.0–100.0)
Platelets: 484 K/uL — ABNORMAL HIGH (ref 150–400)
RBC: 3.38 MIL/uL — ABNORMAL LOW (ref 4.22–5.81)
RDW: 18.3 % — ABNORMAL HIGH (ref 11.5–15.5)
WBC: 7.8 K/uL (ref 4.0–10.5)
nRBC: 0.4 % — ABNORMAL HIGH (ref 0.0–0.2)

## 2020-02-01 LAB — SARS CORONAVIRUS 2 BY RT PCR (HOSPITAL ORDER, PERFORMED IN ~~LOC~~ HOSPITAL LAB): SARS Coronavirus 2: NEGATIVE

## 2020-02-01 MED ORDER — HYDROMORPHONE HCL 1 MG/ML IJ SOLN
0.5000 mg | Freq: Four times a day (QID) | INTRAMUSCULAR | Status: DC | PRN
  Administered 2020-02-01 (×2): 0.5 mg via INTRAVENOUS
  Filled 2020-02-01 (×2): qty 1

## 2020-02-01 MED ORDER — APIXABAN 5 MG PO TABS
5.0000 mg | ORAL_TABLET | Freq: Two times a day (BID) | ORAL | 0 refills | Status: DC
Start: 2020-02-01 — End: 2020-03-21

## 2020-02-01 MED ORDER — DOCUSATE SODIUM 100 MG PO CAPS
100.0000 mg | ORAL_CAPSULE | Freq: Two times a day (BID) | ORAL | 0 refills | Status: DC
Start: 2020-02-01 — End: 2020-07-25

## 2020-02-01 MED ORDER — SODIUM CHLORIDE 0.9 % IV SOLN: 3.0000 g | Freq: Four times a day (QID) | INTRAVENOUS | 0 refills | Status: AC

## 2020-02-01 MED ORDER — GABAPENTIN 300 MG PO CAPS
300.0000 mg | ORAL_CAPSULE | Freq: Three times a day (TID) | ORAL | 0 refills | Status: DC
Start: 2020-02-01 — End: 2020-08-16

## 2020-02-01 MED ORDER — OXYCODONE HCL 20 MG PO TABS: 20.0000 mg | ORAL_TABLET | ORAL | 0 refills | Status: DC | PRN

## 2020-02-01 MED ORDER — POLYETHYLENE GLYCOL 3350 17 G PO PACK: 17.0000 g | PACK | Freq: Every day | ORAL | 0 refills | Status: DC | PRN

## 2020-02-01 MED ORDER — METHOCARBAMOL 500 MG PO TABS
1000.0000 mg | ORAL_TABLET | Freq: Three times a day (TID) | ORAL | 0 refills | Status: DC
Start: 2020-02-01 — End: 2020-07-25

## 2020-02-01 MED ORDER — METOPROLOL TARTRATE 25 MG PO TABS
12.5000 mg | ORAL_TABLET | Freq: Two times a day (BID) | ORAL | 0 refills | Status: DC
Start: 2020-02-01 — End: 2020-07-25

## 2020-02-01 NOTE — TOC Transition Note (Signed)
Transition of Care [facility]) - CM/SW Discharge Note   Patient Details  Name: Jason [name] MRN: [id number] Date of Birth: 1965/05/22  [facility] CM/SW Contact:  Trula Ore, [place] Phone Number: 02/01/2020, 2:38 PM   Clinical Narrative:     Patient will DC to: [facility]   Anticipated DC date: 02/01/2020  Family notified: Belenda Cruise   Transport by: Corey Harold  ?  Per MD patient ready for DC to [facility], patient, patient's family, and facility notified of DC. Discharge Summary sent to facility. RN given number for report tele# [phone] ask for Nurse Kathrene Alu RM#[phone]. DC packet on chart. Ambulance transport requested for patient.  CSW signing off.  Final next level of care: Skilled Nursing Facility Barriers to Discharge: No Barriers Identified   Patient Goals and CMS Choice Patient states their goals for this hospitalization and ongoing recovery are:: to go to SNF CMS Medicare.gov Compare Post Acute Care list provided to:: Patient Choice offered to / list presented to : Patient  Discharge Placement              Patient chooses bed at: [facility] Patient to be transferred to facility by: [facility] Name of family member notified: Belenda Cruise Patient and family notified of of transfer: 02/01/20  Discharge Plan and Services   Discharge Planning Services: CM Consult Post Acute Care Choice: Home Health (has wheelchair)                    [place] Arranged: PT, OT [place] Agency: [place] Date [place]: 01/16/20 Time [place] Agency Contacted: 1219 Representative spoke with at [place]: Adela Lank  Social Determinants of Health (SDOH) Interventions     Readmission Risk Interventions No flowsheet data found.

## 2020-02-01 NOTE — Progress Notes (Signed)
[employer] @ [phone] x [phone] -  "No one available to answer the call at this time" and unable to leave a message due to "Mailbox is full" CSW made aware. Will try at later time. Leveda Anna, BSN, RN

## 2020-02-01 NOTE — Progress Notes (Signed)
Report given to [employer] receiving Nurse Kathrene Alu, LPN. Questions answered, concerns clarified. Verbalized understanding. Awaiting PTAR to transfer patient out. Leveda Anna, BSN, RN

## 2020-02-01 NOTE — Discharge Summary (Signed)
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 Grad:      7.2 mmHg AV Mean Grad:      4.0 mmHg LVOT Vmax:         96.00 cm/s LVOT Vmean:        70.200 cm/s LVOT VTI:          0.133 m LVOT/AV VTI ratio: 0.62  AORTA Ao Root diam: 3.40 cm MITRAL VALVE               TRICUSPID VALVE MV Area (PHT): 3.42 cm    TR Peak grad:  26.0 mmHg MV Decel Time: 222 msec    TR Vmax:        255.00 cm/s MV E velocity: 72.00 cm/s MV A velocity: 62.10 cm/s  SHUNTS MV E/A ratio:  1.16        Systemic VTI:  0.13 m                            Systemic Diam: 2.30 cm Eleonore Chiquito MD Electronically signed by Eleonore Chiquito MD Signature Date/Time: 01/13/2020/3:44:25 PM    Final    VAS [place] LOWER EXTREMITY VENOUS (DVT)  Result Date: 01/14/2020  Lower Venous DVTStudy Indications: Suspected PE.  Risk Factors: Surgery 01-12-20 Irrigation and debridement left lower Ext. Limitations: Open wound and Right Above the knee Amputation. Comparison Study: 12-01-2011 Performing  Technologist: Griffin Basil RCT RDMS  Examination Guidelines: A complete evaluation includes B-mode imaging, spectral Doppler, color Doppler, and power Doppler as needed of all accessible portions of each vessel. Bilateral testing is considered an integral part of a complete examination. Limited examinations for reoccurring indications may be performed as noted. The reflux portion of the exam is performed with the patient in reverse Trendelenburg.  +---------+---------------+---------+-----------+----------+--------------+ RIGHT    CompressibilityPhasicitySpontaneityPropertiesThrombus Aging +---------+---------------+---------+-----------+----------+--------------+ CFV      Full           Yes      Yes                                 +---------+---------------+---------+-----------+----------+--------------+ SFJ      Full                                                        +---------+---------------+---------+-----------+----------+--------------+ FV Prox  Full                                                        +---------+---------------+---------+-----------+----------+--------------+ FV Mid   Full                                                        +---------+---------------+---------+-----------+----------+--------------+ FV DistalFull                                                        +---------+---------------+---------+-----------+----------+--------------+ Right Above the knee Amputaion.  Right Technical Findings: No DVT seen in the visualized areas.  +-------+---------------+---------+-----------+----------+--------------+ LEFT   CompressibilityPhasicitySpontaneityPropertiesThrombus Aging +-------+---------------+---------+-----------+----------+--------------+ CFV    Full           Yes      Yes                                 +-------+---------------+---------+-----------+----------+--------------+ SFJ    Full                                                         +-------+---------------+---------+-----------+----------+--------------+  FV ProxFull                                                        +-------+---------------+---------+-----------+----------+--------------+ FV Mid Full                                                        +-------+---------------+---------+-----------+----------+--------------+ Left Leg Open wound with Vac and Bandage.  Left Technical Findings: No DVT seen in the visualized areas.   Summary: RIGHT: - There is no evidence of deep vein thrombosis in the lower extremity.  LEFT: - There is no evidence of deep vein thrombosis in the lower extremity.  *See table(s) above for measurements and observations. Electronically signed by Deitra Mayo MD on 01/14/2020 at 8:47:25 AM.    Final    ECHOCARDIOGRAM LIMITED  Result Date: 01/15/2020    ECHOCARDIOGRAM LIMITED REPORT   Patient Name:   VARIAN INNES Date of Exam: 01/15/2020 Medical Rec #:  [id number]      Height:       70.0 in Accession #:    [id number]     Weight:       175.0 lb Date of Birth:  [birth year]      BSA:          1.972 m Patient Age:    35 years       BP:           138/77 mmHg Patient Gender: M              HR:           90 bpm. Exam Location:  Inpatient Procedure: Limited Echo Indications:    Pulmonary Embolus 415.19 / I26.99  History:        Patient has prior history of Echocardiogram examinations, most                 recent 01/13/2020. Signs/Symptoms:Chest Pain and Syncope; Risk                 Factors:Hypertension.  Sonographer:    Alvino Chapel RCS Referring Phys: [id number] [place]  1. Left ventricular ejection fraction, by estimation, is 60 to 65%. The left ventricle has normal function. The left ventricle has no regional wall motion abnormalities.  2. Right ventricular systolic function is mildly reduced. The right ventricular size is mildly enlarged. Comparison(s): The right ventricular systolic function  has improved. FINDINGS  Left Ventricle: Left ventricular ejection fraction, by estimation, is 60 to 65%. The left ventricle has normal function. The left ventricle has no regional wall motion abnormalities. The left ventricular internal cavity size was normal in size. There is  no left ventricular hypertrophy. Right Ventricle: The right ventricular size is mildly enlarged. No increase in right ventricular wall thickness. Right ventricular systolic function is mildly reduced. Pericardium: There is no evidence of pericardial effusion. Candee Furbish MD Electronically signed by Candee Furbish MD Signature Date/Time: 01/15/2020/4:53:38 PM    Final    [facility] PLACEMENT LEFT >5 YRS INC IMG GUIDE  Result Date: 01/25/2020 INDICATION: Patient with osteomyelitis requiring 6 weeks of intravenous antibiotics. Patient has poor venous access, interventional radiology  asked to place a PICC. EXAM: ULTRASOUND AND FLUOROSCOPIC GUIDED PICC LINE INSERTION MEDICATIONS: 1% lidocaine 2 mL CONTRAST:  None FLUOROSCOPY TIME:  36 seconds (2 mGy) COMPLICATIONS: None immediate. TECHNIQUE: The procedure, risks, benefits, and alternatives were explained to the patient and informed written consent was obtained. The left upper extremity was prepped with chlorhexidine in a sterile fashion, and a sterile drape was applied covering the operative field. Maximum barrier sterile technique with sterile gowns and gloves were used for the procedure. A timeout was performed prior to the initiation of the procedure. Local anesthesia was provided with 1% lidocaine. After the overlying soft tissues were anesthetized with 1% lidocaine, a micropuncture kit was utilized to access the left basilic vein. Real-time ultrasound guidance was utilized for vascular access including the acquisition of a permanent ultrasound image documenting patency of the accessed vessel. A guidewire was advanced to the level of the superior caval-atrial junction for measurement purposes  and the PICC line was cut to length. A peel-away sheath was placed and a 38 cm, 5 [place], dual lumen was inserted to level of the superior caval-atrial junction. A post procedure spot fluoroscopic was obtained. The catheter easily aspirated and flushed and was secured in place. A dressing was placed. The patient tolerated the procedure well without immediate post procedural complication. FINDINGS: After catheter placement, the tip lies within the superior cavoatrial junction. The catheter aspirates and flushes normally and is ready for immediate use. IMPRESSION: Successful ultrasound and fluoroscopic guided placement of a left basilic vein approach, 38 cm, 5 French, dual lumen PICC with tip at the superior caval-atrial junction. The PICC line is ready for immediate use. Read by: Soyla Dryer, NP Electronically Signed   By: Aletta Edouard M.D.   On: 01/25/2020 10:07   [place] EKG SITE RITE  Result Date: 01/14/2020 If Site Rite image not attached, placement could not be confirmed due to current cardiac rhythm.      Subjective: Patient seen and examined at bedside.  Complains of left lower extremity pain.  No overnight fever, vomiting, worsening shortness of breath. Discharge Exam: Vitals:   02/01/20 0755 02/01/20 0800  BP: 124/78 124/78  Pulse: 72 75  Resp: 15 16  Temp: 98.1 F (36.7 C)   SpO2: 96% 96%    General: Pt is alert, awake, not in acute distress.  Looks older than stated age. Cardiovascular: rate controlled, S1/S2 + Respiratory: bilateral decreased breath sounds at bases Abdominal: Soft, NT, ND, bowel sounds + Extremities: Left lower extremity with Ace bandage and wound VAC.  Right AKA present   The results of significant diagnostics from this hospitalization (including imaging, microbiology, ancillary and laboratory) are listed below for reference.     Microbiology: No results found for this or any previous visit (from the past 240 hour(s)).   Labs: BNP (last 3  results) No results for input(s): BNP in the last 8760 hours. Basic Metabolic Panel: Recent Labs  Lab 01/28/20 0807 01/29/20 0302 01/30/20 0604 01/31/20 0447 02/01/20 0444  NA 138 140 138 138 138  K 4.0 3.9 3.7 4.1 4.2  CL 105 104 106 102 105  CO2 _0 GLUCOSE 137* 109* 104* 119* 121*  BUN _1 CREATININE 0.78 0.87 0.68 0.86 0.89  CALCIUM 8.4* 8.5* 7.9* 9.1 9.1   Liver Function Tests: Recent Labs  Lab 01/26/20 0406 01/27/20 0416  AST 15 14*  ALT 10 10  ALKPHOS 93 101  BILITOT 0.7 0.7  PROT 5.8* 5.7*  ALBUMIN 2.2* 2.1*   No results for input(s): LIPASE, AMYLASE in the last 168 hours. No results for input(s): AMMONIA in the last 168 hours. CBC: Recent Labs  Lab 01/28/20 0347 01/28/20 0347 01/28/20 1243 01/29/20 0302 01/30/20 0604 01/31/20 0447 02/01/20 0444  WBC 9.9  --   --  8.6 8.6 10.5 7.8  HGB 7.1*   < > 7.8* 8.1* 7.6* 9.5* 8.9*  HCT 22.7*   < > 25.9* 26.3* 24.7* 31.3* 30.2*  MCV 86.6  --   --  89.8 89.8 87.4 89.3  PLT 313  --   --  369 368 441* 484*   < > = values in this interval not displayed.   Cardiac Enzymes: No results for input(s): CKTOTAL, CKMB, CKMBINDEX, TROPONINI in the last 168 hours. BNP: Invalid input(s): POCBNP CBG: No results for input(s): GLUCAP in the last 168 hours. D-Dimer No results for input(s): DDIMER in the last 72 hours. Hgb A1c No results for input(s): HGBA1C in the last 72 hours. Lipid Profile No results for input(s): CHOL, HDL, LDLCALC, TRIG, CHOLHDL, LDLDIRECT in the last 72 hours. Thyroid function studies No results for input(s): TSH, T4TOTAL, T3FREE, THYROIDAB in the last 72 hours.  Invalid input(s): FREET3 Anemia work up No results for input(s): VITAMINB12, FOLATE, FERRITIN, TIBC, IRON, RETICCTPCT in the last 72 hours. Urinalysis    Component Value Date/Time   COLORURINE YELLOW 01/11/2020 2238   APPEARANCEUR HAZY (A) 01/11/2020 2238   APPEARANCEUR Clear 08/14/2017 0922   LABSPEC 1.024  01/11/2020 2238   PHURINE 5.0 01/11/2020 2238   GLUCOSEU 150 (A) 01/11/2020 2238   HGBUR NEGATIVE 01/11/2020 2238   BILIRUBINUR NEGATIVE 01/11/2020 2238   BILIRUBINUR Negative 08/14/2017 0922   KETONESUR 20 (A) 01/11/2020 2238   PROTEINUR 30 (A) 01/11/2020 2238   UROBILINOGEN 0.2 11/25/2011 1300   NITRITE NEGATIVE 01/11/2020 2238   LEUKOCYTESUR NEGATIVE 01/11/2020 2238   Sepsis Labs Invalid input(s): PROCALCITONIN,  WBC,  LACTICIDVEN Microbiology No results found for this or any previous visit (from the past 240 hour(s)).   Time coordinating discharge: 35 minutes  SIGNED:   Aline August, MD  [facility] 02/01/2020, 11:16 AM

## 2020-02-03 ENCOUNTER — Ambulatory Visit: Payer: Medicare Other | Admitting: Cardiology

## 2020-02-06 ENCOUNTER — Encounter: Payer: Self-pay | Admitting: Physician Assistant

## 2020-02-06 ENCOUNTER — Ambulatory Visit (INDEPENDENT_AMBULATORY_CARE_PROVIDER_SITE_OTHER): Payer: Medicare Other | Admitting: Physician Assistant

## 2020-02-06 DIAGNOSIS — M869 Osteomyelitis, unspecified: Secondary | ICD-10-CM

## 2020-02-06 NOTE — Progress Notes (Signed)
Office Visit Note   Patient: Jason [name]           Date of Birth: 1965-11-16           MRN: [id number] Visit Date: 02/06/2020              Requested by: No referring provider defined for this encounter. PCP: Patient, No Pcp Per  Chief Complaint  Patient presents with  . Left Knee - Pain      HPI: Patient presents today 10 days status post irrigation and debridement of his  knee.  He is currently at his nursing facility he has been compliant with keeping the immobilizer in place he is asking if he can be given IV pain medication at his current location  Assessment & Plan: Visit Diagnoses: No diagnosis found.  Plan: Patient was seen today by Dr. Sharol Given he explained to the patient that we will have chronic pain in this knee.  Pain medication would only be a temporary solution.  Ultimately if he had significant pain that was unrelenting he could consider an amputation.  He does not want to consider this at this time and is also pursuing chronic pain management Follow-Up Instructions: No follow-ups on file.   Ortho Exam  Patient is alert, oriented, no adenopathy, well-dressed, normal affect, normal respiratory effort. Well apposed surgical incision.  No cellulitis no effusion no erythema mildly tender along the suture line.  Surgical stitches were removed and Steri-Strips were placed without any difficulty or any dehiscence  Imaging: No results found. No images are attached to the encounter.  Labs: Lab Results  Component Value Date   ESRSEDRATE 80 (H) 01/24/2020   ESRSEDRATE 36 (H) 11/29/2011   ESRSEDRATE 9 11/25/2011   CRP 40.0 (H) 01/24/2020   CRP 19.5 (H) 11/29/2011   CRP 6.8 (H) 11/25/2011   REPTSTATUS 01/23/2020 FINAL 01/18/2020   GRAMSTAIN  01/18/2020    RARE WBC PRESENT,BOTH PMN AND MONONUCLEAR NO ORGANISMS SEEN    CULT  01/18/2020    No growth aerobically or anaerobically. Performed at [facility], [place] [address], [place], [place] [address]       Lab Results  Component Value Date   ALBUMIN 2.1 (L) 01/27/2020   ALBUMIN 2.2 (L) 01/26/2020   ALBUMIN 2.1 (L) 01/25/2020    Lab Results  Component Value Date   MG 1.8 01/25/2020   MG 1.8 01/19/2020   MG 1.9 01/12/2020   No results found for: VD25OH  No results found for: PREALBUMIN CBC EXTENDED Latest Ref Rng & Units 02/01/2020 01/31/2020 01/30/2020  WBC 4.0 - 10.5 K/uL 7.8 10.5 8.6  RBC 4.22 - 5.81 MIL/uL 3.38(L) 3.58(L) 2.75(L)  HGB 13.0 - 17.0 g/dL 8.9(L) 9.5(L) 7.6(L)  HCT 39 - 52 % 30.2(L) 31.3(L) 24.7(L)  PLT 150 - 400 K/uL 484(H) 441(H) 368  NEUTROABS 1.7 - 7.7 K/uL - - -  LYMPHSABS 0.7 - 4.0 K/uL - - -     There is no height or weight on file to calculate BMI.  Orders:  No orders of the defined types were placed in this encounter.  No orders of the defined types were placed in this encounter.    Procedures: No procedures performed  Clinical Data: No additional findings.  ROS:  All other systems negative, except as noted in the HPI. Review of Systems  Objective: Vital Signs: There were no vitals taken for this visit.  Specialty Comments:  No specialty comments available.  PMFS History: Patient  Active Problem List   Diagnosis Date Noted  . Osteomyelitis of left knee region ([place])   . Bilateral pulmonary embolism ([place]) 01/15/2020  . Encounter for central line placement   . Septic arthritis ([place]) 01/11/2020  . Syncope 01/11/2020  . Elevated troponin 01/11/2020  . Chest pain 01/11/2020  . Bright red blood per rectum 01/11/2020  . Anemia 01/11/2020  . Pain management contract broken 09/13/2018  . Benzodiazepine dependence, continuous ([place]) 12/25/2017  . Amputation stump pain ([place]) 11/18/2017  . Opiate dependence ([place]) 05/06/2017  . Primary insomnia 05/06/2017  . Functional diarrhea 05/06/2017  . Phantom limb pain ([place]) 05/06/2017  . Muscle atrophy of lower extremity 05/06/2017  . Other chronic pain 05/06/2017  . Essential hypertension  07/14/2016  . S/P AKA (above knee amputation) unilateral, right ([place]) 07/14/2016  . Shoulder impingement syndrome, left 07/29/2011   Past Medical History:  Diagnosis Date  . Allergy   . Anxiety   . Arthritis    left shoulder  . Bronchitis   . Chest pain 07/14/2016  . Complication of anesthesia   . DJD (degenerative joint disease) 11/29/2011  . Hypertension   . Neuromuscular disorder ([place])    carpal tunnel bilateral, ulner nerve surgery  . PONV (postoperative nausea and vomiting)   . Septic arthritis of knee, right ([place]) 11/29/2011  . Spinal headache    "long time ago"    Family History  Problem Relation Age of Onset  . Heart attack Father   . COPD Father   . Diabetes Father   . Hypertension Father   . Heart attack Sister   . Arrhythmia Sister        Long QT syndrome, PPM  . Hypertension Mother   . Drug abuse Brother     Past Surgical History:  Procedure Laterality Date  . 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 APPENDECTOMY    . CHOLECYSTECTOMY    . I & D EXTREMITY Left 01/12/2020   Procedure: IRRIGATION AND DEBRIDEMENT EXTREMITY;  Surgeon: Shona Needles, MD;  Location: [facility];  Service: Orthopedics;  Laterality: Left;  . I & D EXTREMITY Left 01/18/2020   Procedure: IRRIGATION AND DEBRIDEMENT EXTREMITY;  Surgeon: Newt Minion, MD;  Location: [facility];  Service: Orthopedics;  Laterality: Left;  . I & D EXTREMITY Left 01/27/2020   Procedure: EXCISIONAL DEBRIDEMENT LEFT KNEE WITH ANTIBIOTIC BEADS;  Surgeon: Newt Minion, MD;  Location: [facility];  Service: Orthopedics;  Laterality: Left;  . IRRIGATION AND DEBRIDEMENT KNEE  11/26/2011   Procedure: IRRIGATION AND DEBRIDEMENT KNEE;  Surgeon: Kerin Salen, MD;  Location: [facility];  Service: Orthopedics;  Laterality: Right;  . KNEE ARTHROSCOPY  10/24/2011   Procedure: ARTHROSCOPY KNEE;  Surgeon: Kerin Salen, MD;  Location: [facility];  Service: Orthopedics;  Laterality: Right;  . KNEE SURGERY     7 knee surgeries on right, and 4  knee surgeries left  . LEFT HEART CATH AND CORONARY ANGIOGRAPHY N/A 07/14/2016   Procedure: Left Heart Cath and Coronary Angiography;  Surgeon: Belva Crome, MD;  Location: [facility];  Service: Cardiovascular;  Laterality: N/A;  . SHOULDER ARTHROSCOPY  07/29/2011   Procedure: ARTHROSCOPY SHOULDER;  Surgeon: Mcarthur Rossetti, MD;  Location: [facility];  Service: Orthopedics;  Laterality: Left;  Left shoulder arthroscopy with minimal debridement, left wrist steroid injection  . SHOULDER SURGERY     3 surgeries on right, 2 surgeries on left  . ULNAR NERVE REPAIR  Social History   Occupational History  . Not on file  Tobacco Use  . Smoking status: Never Smoker  . Smokeless tobacco: Never Used  Vaping Use  . Vaping Use: Never used  Substance and Sexual Activity  . Alcohol use: No  . Drug use: No  . Sexual activity: Yes

## 2020-02-15 LAB — FUNGUS CULTURE WITH STAIN

## 2020-02-15 LAB — FUNGAL ORGANISM REFLEX

## 2020-02-15 LAB — FUNGUS CULTURE RESULT

## 2020-02-24 ENCOUNTER — Ambulatory Visit: Payer: Medicare Other | Admitting: Physician Assistant

## 2020-03-01 DIAGNOSIS — S81801D Unspecified open wound, right lower leg, subsequent encounter: Secondary | ICD-10-CM | POA: Diagnosis not present

## 2020-03-01 DIAGNOSIS — M00862 Arthritis due to other bacteria, left knee: Secondary | ICD-10-CM | POA: Diagnosis not present

## 2020-03-01 DIAGNOSIS — G894 Chronic pain syndrome: Secondary | ICD-10-CM | POA: Diagnosis not present

## 2020-03-01 DIAGNOSIS — Z86711 Personal history of pulmonary embolism: Secondary | ICD-10-CM | POA: Diagnosis not present

## 2020-03-01 DIAGNOSIS — Z7901 Long term (current) use of anticoagulants: Secondary | ICD-10-CM | POA: Diagnosis not present

## 2020-03-01 DIAGNOSIS — M6281 Muscle weakness (generalized): Secondary | ICD-10-CM | POA: Diagnosis not present

## 2020-03-02 ENCOUNTER — Ambulatory Visit: Payer: Medicare Other | Admitting: Physician Assistant

## 2020-03-02 DIAGNOSIS — M00862 Arthritis due to other bacteria, left knee: Secondary | ICD-10-CM | POA: Diagnosis not present

## 2020-03-08 ENCOUNTER — Other Ambulatory Visit: Payer: Self-pay

## 2020-03-08 ENCOUNTER — Emergency Department (HOSPITAL_COMMUNITY)
Admission: EM | Admit: 2020-03-08 | Discharge: 2020-03-09 | Disposition: A | Discharge status: Home / Self Care | Payer: Medicare Other | Attending: Emergency Medicine | Admitting: Emergency Medicine

## 2020-03-08 ENCOUNTER — Emergency Department (HOSPITAL_COMMUNITY): Payer: Medicare Other

## 2020-03-08 DIAGNOSIS — M1712 Unilateral primary osteoarthritis, left knee: Secondary | ICD-10-CM | POA: Diagnosis not present

## 2020-03-08 DIAGNOSIS — W010XXA Fall on same level from slipping, tripping and stumbling without subsequent striking against object, initial encounter: Secondary | ICD-10-CM | POA: Insufficient documentation

## 2020-03-08 DIAGNOSIS — W19XXXA Unspecified fall, initial encounter: Secondary | ICD-10-CM

## 2020-03-08 DIAGNOSIS — Y92007 Garden or yard of unspecified non-institutional (private) residence as the place of occurrence of the external cause: Secondary | ICD-10-CM | POA: Diagnosis not present

## 2020-03-08 DIAGNOSIS — I10 Essential (primary) hypertension: Secondary | ICD-10-CM | POA: Diagnosis not present

## 2020-03-08 DIAGNOSIS — R569 Unspecified convulsions: Secondary | ICD-10-CM | POA: Diagnosis not present

## 2020-03-08 DIAGNOSIS — I672 Cerebral atherosclerosis: Secondary | ICD-10-CM | POA: Diagnosis not present

## 2020-03-08 DIAGNOSIS — Z8739 Personal history of other diseases of the musculoskeletal system and connective tissue: Secondary | ICD-10-CM | POA: Insufficient documentation

## 2020-03-08 DIAGNOSIS — Z20822 Contact with and (suspected) exposure to covid-19: Secondary | ICD-10-CM | POA: Insufficient documentation

## 2020-03-08 DIAGNOSIS — I6529 Occlusion and stenosis of unspecified carotid artery: Secondary | ICD-10-CM | POA: Diagnosis not present

## 2020-03-08 DIAGNOSIS — R42 Dizziness and giddiness: Secondary | ICD-10-CM | POA: Diagnosis not present

## 2020-03-08 DIAGNOSIS — R000 Tachycardia, unspecified: Secondary | ICD-10-CM | POA: Diagnosis not present

## 2020-03-08 DIAGNOSIS — I739 Peripheral vascular disease, unspecified: Secondary | ICD-10-CM | POA: Diagnosis not present

## 2020-03-08 DIAGNOSIS — Z79899 Other long term (current) drug therapy: Secondary | ICD-10-CM | POA: Diagnosis not present

## 2020-03-08 DIAGNOSIS — Z049 Encounter for examination and observation for unspecified reason: Secondary | ICD-10-CM | POA: Diagnosis not present

## 2020-03-08 DIAGNOSIS — M25562 Pain in left knee: Secondary | ICD-10-CM | POA: Diagnosis not present

## 2020-03-08 LAB — COMPREHENSIVE METABOLIC PANEL
ALT: 14 U/L (ref 0–44)
AST: 21 U/L (ref 15–41)
Albumin: 3.6 g/dL (ref 3.5–5.0)
Alkaline Phosphatase: 87 U/L (ref 38–126)
Anion gap: 14 (ref 5–15)
BUN: 13 mg/dL (ref 6–20)
CO2: 25 mmol/L (ref 22–32)
Calcium: 9.7 mg/dL (ref 8.9–10.3)
Chloride: 104 mmol/L (ref 98–111)
Creatinine, Ser: 0.93 mg/dL (ref 0.61–1.24)
GFR, Estimated: >60 mL/min (ref >60)
Glucose, Bld: 82 mg/dL (ref 70–99)
Potassium: 3.3 mmol/L — ABNORMAL LOW (ref 3.5–5.1)
Sodium: 143 mmol/L (ref 135–145)
Total Bilirubin: 0.4 mg/dL (ref 0.3–1.2)
Total Protein: 6.8 g/dL (ref 6.5–8.1)

## 2020-03-08 LAB — CBC WITH DIFFERENTIAL/PLATELET
Abs Immature Granulocytes: 0.02 10*3/uL (ref 0.00–0.07)
Basophils Absolute: 0 10*3/uL (ref 0.0–0.1)
Basophils Relative: 1 %
Eosinophils Absolute: 0.1 10*3/uL (ref 0.0–0.5)
Eosinophils Relative: 2 %
HCT: 38.7 % — ABNORMAL LOW (ref 39.0–52.0)
Hemoglobin: 11.6 g/dL — ABNORMAL LOW (ref 13.0–17.0)
Immature Granulocytes: 0 %
Lymphocytes Relative: 24 %
Lymphs Abs: 1.8 10*3/uL (ref 0.7–4.0)
MCH: 25.5 pg — ABNORMAL LOW (ref 26.0–34.0)
MCHC: 30 g/dL (ref 30.0–36.0)
MCV: 85.1 fL (ref 80.0–100.0)
Monocytes Absolute: 0.8 10*3/uL (ref 0.1–1.0)
Monocytes Relative: 11 %
Neutro Abs: 4.6 10*3/uL (ref 1.7–7.7)
Neutrophils Relative %: 62 %
Platelets: 355 10*3/uL (ref 150–400)
RBC: 4.55 MIL/uL (ref 4.22–5.81)
RDW: 15.9 % — ABNORMAL HIGH (ref 11.5–15.5)
WBC: 7.4 10*3/uL (ref 4.0–10.5)
nRBC: 0 % (ref 0.0–0.2)

## 2020-03-08 LAB — RESP PANEL BY RT-PCR (FLU A&B, COVID) ARPGX2
Influenza A by PCR: NEGATIVE
Influenza B by PCR: NEGATIVE
SARS Coronavirus 2 by RT PCR: NEGATIVE

## 2020-03-08 LAB — TROPONIN I (HIGH SENSITIVITY): Troponin I (High Sensitivity): 12 ng/L (ref <18)

## 2020-03-08 MED ORDER — ONDANSETRON HCL 4 MG/2ML IJ SOLN
4.0000 mg | Freq: Once | INTRAMUSCULAR | Status: AC
  Administered 2020-03-08: 23:00:00 4 mg via INTRAVENOUS
  Filled 2020-03-08: qty 2

## 2020-03-08 MED ORDER — HYDROMORPHONE HCL 1 MG/ML IJ SOLN
1.0000 mg | Freq: Once | INTRAMUSCULAR | Status: AC
  Administered 2020-03-08: 23:00:00 1 mg via INTRAVENOUS
  Filled 2020-03-08: qty 1

## 2020-03-08 NOTE — ED Notes (Signed)
2nd IV team RN at bedside. Provider at bedside for EJ placement.

## 2020-03-08 NOTE — ED Triage Notes (Signed)
Patient BIB EMS from home. Called by patient for long list of concerns. Patient was d/c from rehab on 16th after left knee surgery for debridement with antibiotic beads. Was in hospital x 1 month. Patient also said home health nurse or doctor said his HGB was low but can't remember which doctor called. Patient reports dizziness and feeling foggy. Per EMS had three 30 second shaking spells in the ambulance w no voiding, no postictal phase, and no noted resp problems. Received 10 mg versed IM at that time. BG 113 for EMS. Patient also worried that he fell in the yard. Patient with hx of [place], right leg above knee amputation from trauma many years ago. No distress noted at this time.

## 2020-03-08 NOTE — ED Notes (Signed)
IV team still at bedside, pt is a very hard stick.

## 2020-03-08 NOTE — ED Provider Notes (Signed)
Emergency Department Provider Note   I have reviewed the triage vital signs and the nursing notes.   HISTORY  Chief Complaint Weakness   HPI Jason [name] is a 54 y.o. 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  Patient states that he fell today after tripping over his cat.  He states he fell down several steps and lost consciousness.  He believes he may have struck his head.  He ultimately called EMS and they report brief seizure-like episode with no postictal period.    Past Medical History:  Diagnosis Date  . Allergy   . Anxiety   . Arthritis    left shoulder  . Bronchitis   . Chest pain 07/14/2016  . Complication of anesthesia   . DJD (degenerative joint disease) 11/29/2011  . Hypertension   . Neuromuscular disorder (HCC)    carpal tunnel bilateral, ulner nerve surgery  . PONV (postoperative nausea and vomiting)   . Septic arthritis of knee, right (HCC) 11/29/2011  . Spinal headache    "[place] time ago"    Patient Active Problem List   Diagnosis Date Noted  . Osteomyelitis of left knee region (HCC)   . Bilateral pulmonary embolism (HCC) 01/15/2020  . Encounter for central line placement   . Septic arthritis (HCC) 01/11/2020  . Syncope 01/11/2020  . Elevated troponin 01/11/2020  . Chest pain 01/11/2020  . Bright red blood per rectum 01/11/2020  . Anemia 01/11/2020  . Pain management contract broken 09/13/2018  . Benzodiazepine dependence, continuous (HCC) 12/25/2017  . Amputation stump pain (HCC) 11/18/2017  . Opiate dependence (HCC) 05/06/2017  . Primary insomnia 05/06/2017  . Functional diarrhea 05/06/2017  . Phantom limb pain (HCC)  05/06/2017  . Muscle atrophy of lower extremity 05/06/2017  . Other chronic pain 05/06/2017  . Essential hypertension 07/14/2016  . S/P AKA (above knee amputation) unilateral, right (HCC) 07/14/2016  . Shoulder impingement syndrome, left 07/29/2011    Past Surgical History:  Procedure Laterality Date  . 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 APPENDECTOMY    . CHOLECYSTECTOMY    . I & D EXTREMITY Left 01/12/2020   Procedure: IRRIGATION AND DEBRIDEMENT EXTREMITY;  Surgeon: Roby Lofts, MD;  Location: [facility] OR;  Service: Orthopedics;  Laterality: Left;  . I & D EXTREMITY Left 01/18/2020   Procedure: IRRIGATION AND DEBRIDEMENT EXTREMITY;  Surgeon: Nadara Mustard, MD;  Location: [facility] OR;  Service: Orthopedics;  Laterality: Left;  . I & D EXTREMITY Left 01/27/2020   Procedure: EXCISIONAL DEBRIDEMENT LEFT KNEE WITH ANTIBIOTIC BEADS;  Surgeon: Nadara Mustard, MD;  Location: [facility] OR;  Service: Orthopedics;  Laterality: Left;  . IRRIGATION AND DEBRIDEMENT KNEE  11/26/2011   Procedure: IRRIGATION AND DEBRIDEMENT KNEE;  Surgeon: Nestor Lewandowsky, MD;  Location: [facility] OR;  Service: Orthopedics;  Laterality: Right;  . KNEE ARTHROSCOPY  10/24/2011   Procedure: ARTHROSCOPY KNEE;  Surgeon: Nestor Lewandowsky, MD;  Location: [facility] OR;  Service: Orthopedics;  Laterality: Right;  . KNEE SURGERY     7 knee surgeries on right, and 4 knee surgeries left  . LEFT HEART CATH AND CORONARY ANGIOGRAPHY N/A 07/14/2016   Procedure: Left Heart Cath and Coronary Angiography;  Surgeon: Sherilyn Cooter  Malissa Hippo, MD;  Location: [facility] INVASIVE CV LAB;  Service: Cardiovascular;  Laterality: N/A;  . SHOULDER ARTHROSCOPY  07/29/2011   Procedure: ARTHROSCOPY SHOULDER;  Surgeon: Kathryne Hitch, MD;  Location[facility] OR;  Service: Orthopedics;  Laterality: Left;  Left shoulder arthroscopy with minimal debridement, left wrist steroid injection  . SHOULDER SURGERY     3 surgeries on right, 2 surgeries on left  . ULNAR NERVE REPAIR      Allergies Ivp  dye [iodinated diagnostic agents], Metrizamide, Other, Reglan [metoclopramide], Shellfish-derived products, Codeine, Lidocaine, Methadone, Morphine, Propoxyphene, Toradol [ketorolac tromethamine], Iodine, Tape, and Vancomycin  Family History  Problem Relation Age of Onset  . Heart attack Father   . COPD Father   . Diabetes Father   . Hypertension Father   . Heart attack Sister   . Arrhythmia Sister        Zeniah Briney QT syndrome, PPM  . Hypertension Mother   . Drug abuse Brother     Social History Social History   Tobacco Use  . Smoking status: Never Smoker  . Smokeless tobacco: Never Used  Vaping Use  . Vaping Use: Never used  Substance Use Topics  . Alcohol use: No  . Drug use: No    Review of Systems  Constitutional: No fever/chills.  Eyes: No visual changes. ENT: No sore throat. Cardiovascular: Denies chest pain. Respiratory: Denies shortness of breath. Gastrointestinal: No abdominal pain.  No nausea, no vomiting.  No diarrhea.  No constipation. Genitourinary: Negative for dysuria. Musculoskeletal: Negative for back pain. Acute on chronic left knee pain.  Skin: Question left knee redness.  Neurological: Negative for focal weakness or numbness. Positive HA.   10-point ROS otherwise negative.  ____________________________________________   PHYSICAL EXAM:  VITAL SIGNS: ED Triage Vitals  Enc Vitals Group     BP 03/08/20 1859 109/83     Pulse Rate 03/08/20 1859 96     Resp 03/08/20 1859 20     Temp 03/08/20 1915 98.2 F (36.8 C)     Temp Source 03/08/20 1859 Oral     SpO2 03/08/20 1853 100 %     Weight 03/08/20 1900 164 lb (74.4 kg)     Height 03/08/20 1900 5\' 10"  (1.778 m)   Constitutional: Alert and oriented. Well appearing and in no acute distress. Eyes: Conjunctivae are normal.  Head: Atraumatic. Nose: No congestion/rhinnorhea. Mouth/Throat: Mucous membranes are moist.   Neck: No stridor.   Cardiovascular: Normal rate, regular rhythm. Good peripheral  circulation. Grossly normal heart sounds.   Respiratory: Normal respiratory effort.  No retractions. Lungs CTAB. Gastrointestinal: Soft and nontender. No distention.  Musculoskeletal: Left knee is status post surgery with well-appearing wound.  The joint is not red or warm to touch.  No cellulitis.  Neurologic:  Normal speech and language. No gross focal neurologic deficits are appreciated.  Skin:  Skin is warm, dry and intact. No rash noted.   ____________________________________________   LABS (all labs ordered are listed, but only abnormal results are displayed)  Labs Reviewed  COMPREHENSIVE METABOLIC PANEL - Abnormal; Notable for the following components:      Result Value   Potassium 3.3 (*)    All other components within normal limits  CBC WITH DIFFERENTIAL/PLATELET - Abnormal; Notable for the following components:   Hemoglobin 11.6 (*)    HCT 38.7 (*)    MCH 25.5 (*)    RDW 15.9 (*)    All other components within normal limits  RESP PANEL BY RT-PCR (FLU  A&B, COVID) ARPGX2  CULTURE, BLOOD (ROUTINE X 2)  CULTURE, BLOOD (ROUTINE X 2)  LACTIC ACID, PLASMA  TROPONIN I (HIGH SENSITIVITY)   ____________________________________________  EKG   EKG Interpretation  Date/Time:  Thursday March 08 2020 18:51:32 EST Ventricular Rate:  105 PR Interval:    QRS Duration: 98 QT Interval:  342 QTC Calculation: 452 R Axis:   -25 Text Interpretation: Sinus tachycardia Borderline left axis deviation Abnormal R-wave progression, late transition No STEMI Confirmed by Nanda Quinton [phone]) on 03/08/2020 8:05:32 PM       ____________________________________________  RADIOLOGY  DG Chest 2 View  Result Date: 03/08/2020 CLINICAL DATA:  [place], dizziness EXAM: CHEST - 2 VIEW COMPARISON:  01/14/2020 FINDINGS: Frontal and lateral views of the chest demonstrate an unremarkable cardiac silhouette. No airspace disease, effusion, or pneumothorax. There are no acute bony abnormalities.  IMPRESSION: 1. No acute intrathoracic process. Electronically Signed   By: Randa Ngo M.D.   On: 03/08/2020 20:43   CT Head Wo Contrast  Result Date: 03/08/2020 CLINICAL DATA:  Fall, history of Munchausen EXAM: CT HEAD WITHOUT CONTRAST TECHNIQUE: Contiguous axial images were obtained from the base of the skull through the vertex without intravenous contrast. COMPARISON:  CT 01/11/2020 FINDINGS: Brain: Stable rounded hypoattenuating focus in the left posterior lentiform nucleus, possible remote lacune versus prominent perivascular space. No evidence of acute infarction, hemorrhage, hydrocephalus, extra-axial collection, visible mass lesion or mass effect. Symmetric prominence of the ventricles, cisterns and sulci compatible with parenchymal volume loss. Patchy and confluent areas of white matter hypoattenuation are most compatible with chronic microvascular angiopathy. Vascular: Atherosclerotic calcification of the carotid siphons and intradural vertebral arteries. No hyperdense vessel. Skull: Question some left temporal scalp thickening/swelling, correlate for point tenderness. No calvarial fracture or acute osseous injury. Sinuses/Orbits: Paranasal sinuses and mastoid air cells are predominantly clear. Included orbital structures are unremarkable. Other: Debris in the external auditory canals bilaterally. Middle ear cavities are clear. IMPRESSION: 1. No acute intracranial abnormality. 2. Question some left temporal scalp thickening/swelling, correlate for point tenderness. No calvarial fracture or acute osseous injury. 3. Stable parenchymal volume loss and chronic microvascular angiopathy. 4. Stable hypoattenuating focus in the left lentiform nucleus, possible perivascular space or remote lacune. 5. Debris in the external auditory canals, correlate for cerumen impaction. Electronically Signed   By: Lovena Le M.D.   On: 03/08/2020 20:49   DG Knee Complete 4 Views Left  Result Date:  03/08/2020 CLINICAL DATA:  Left knee pain EXAM: LEFT KNEE - COMPLETE 4+ VIEW COMPARISON:  01/11/2020 FINDINGS: Frontal, bilateral oblique, lateral views of the left knee are obtained. Chronic changes along the medial femoral condyle and medial tibial plateau consistent with sequela of prior osteomyelitis. There are no acute displaced fractures. Three compartmental osteoarthritis is again noted. There is no joint effusion. Soft tissues are unremarkable. IMPRESSION: 1. Chronic changes in the medial compartment of the left knee compatible with previous osteomyelitis. 2. No acute fracture. 3. Osteoarthritis. Electronically Signed   By: Randa Ngo M.D.   On: 03/08/2020 20:42    ____________________________________________   PROCEDURES  Procedure(s) performed:   Procedures  None  ____________________________________________   INITIAL IMPRESSION / ASSESSMENT AND PLAN / ED COURSE  Pertinent labs & imaging results that were available during my care of the patient were reviewed by me and considered in my medical decision making (see chart for details).   Patient presents to the emergency department with left knee pain after fall today down the steps.  Doubt septic  joint clinically.  He is afebrile here but given history will obtain cultures.  Patient has been compliant with his medications and completed antibiotics.  Some mention in the discharge summary from 1117 regarding possibility of self inoculation of his left knee in the past.  Imaging of the head and plain film of the knee negative.   12:09 PM  Patient's labs are largely unremarkable.  He has no leukocytosis.  X-ray of the knee shows old changes of osteomyelitis but nothing acute.  Clinically the patient's knee looks well and does not appear infected or septic.  He has chronic pain in the knee which is to be expected.  CT imaging of the head is negative.  We will have the patient follow-up with neurology with possible seizure activity with  EMS but history is less supportive of seizure.  Patient did not have syncope causing fall, he describes tripping over his cat.  Plan to have him follow with his orthopedist as well as PCP in the coming week.  ____________________________________________  FINAL CLINICAL IMPRESSION(S) / ED DIAGNOSES  Final diagnoses:  Fall, initial encounter  Acute pain of left knee  Seizure-like activity ([place])     MEDICATIONS GIVEN DURING THIS VISIT:  Medications  HYDROmorphone (DILAUDID) injection 1 mg (1 mg Intravenous Given 03/08/20 2246)  ondansetron (ZOFRAN) injection 4 mg (4 mg Intravenous Given 03/08/20 2246)    Note:  This document was prepared using Dragon voice recognition software and may include unintentional dictation errors.  Nanda Quinton, MD, [facility] Emergency Medicine    Holden Draughon, [facility], MD 03/09/20 [phone]

## 2020-03-08 NOTE — ED Notes (Signed)
Patient transported to X-ray 

## 2020-03-08 NOTE — ED Notes (Signed)
IV team arrived at bedside  

## 2020-03-08 NOTE — ED Notes (Signed)
Pt back in room at this time.

## 2020-03-08 NOTE — Progress Notes (Signed)
Patient not in room. Instructed nurse to re-consult when patient is available. VU. Fran Lowes, RN VAST

## 2020-03-09 DIAGNOSIS — Z743 Need for continuous supervision: Secondary | ICD-10-CM | POA: Diagnosis not present

## 2020-03-09 DIAGNOSIS — R5381 Other malaise: Secondary | ICD-10-CM | POA: Diagnosis not present

## 2020-03-09 DIAGNOSIS — R279 Unspecified lack of coordination: Secondary | ICD-10-CM | POA: Diagnosis not present

## 2020-03-09 DIAGNOSIS — W19XXXA Unspecified fall, initial encounter: Secondary | ICD-10-CM | POA: Diagnosis not present

## 2020-03-09 LAB — LACTIC ACID, PLASMA: Lactic Acid, Venous: 2 mmol/L (ref 0.5–1.9)

## 2020-03-09 NOTE — ED Notes (Signed)
Called [facility] for patient  

## 2020-03-09 NOTE — ED Notes (Signed)
Pt reports he has no one to pick him up. [facility] called for patient. Pt aware. Pt provided with [place] sandwich and soda by [employer].

## 2020-03-09 NOTE — ED Notes (Signed)
[facility] arrived at bedside.  

## 2020-03-09 NOTE — Discharge Instructions (Signed)
Were seen in the emergency department today after a fall.  Please call your primary care doctor as well as orthopedic doctors as needed with any new or worsening pain symptoms.  Return to the emergency department any fevers.  Please follow with the neurologist listed as the paramedics think you could have had a seizure in route.  Do not drive a car until they cleared to do so.

## 2020-03-09 NOTE — ED Notes (Signed)
Discharge instructions gone over with patient - pt wants to transfer to wheelchair and stay in lobby while waiting for family member to pick him up.

## 2020-03-12 ENCOUNTER — Emergency Department (HOSPITAL_COMMUNITY): Payer: Medicare Other

## 2020-03-12 ENCOUNTER — Emergency Department (HOSPITAL_COMMUNITY)
Admission: EM | Admit: 2020-03-12 | Discharge: 2020-03-13 | Disposition: A | Discharge status: Home / Self Care | Payer: Medicare Other | Attending: Emergency Medicine | Admitting: Emergency Medicine

## 2020-03-12 ENCOUNTER — Other Ambulatory Visit: Payer: Self-pay

## 2020-03-12 DIAGNOSIS — M7989 Other specified soft tissue disorders: Secondary | ICD-10-CM | POA: Diagnosis not present

## 2020-03-12 DIAGNOSIS — Z049 Encounter for examination and observation for unspecified reason: Secondary | ICD-10-CM | POA: Diagnosis not present

## 2020-03-12 DIAGNOSIS — D649 Anemia, unspecified: Secondary | ICD-10-CM

## 2020-03-12 DIAGNOSIS — R600 Localized edema: Secondary | ICD-10-CM | POA: Insufficient documentation

## 2020-03-12 DIAGNOSIS — Z79899 Other long term (current) drug therapy: Secondary | ICD-10-CM | POA: Insufficient documentation

## 2020-03-12 DIAGNOSIS — I10 Essential (primary) hypertension: Secondary | ICD-10-CM | POA: Diagnosis not present

## 2020-03-12 DIAGNOSIS — R079 Chest pain, unspecified: Secondary | ICD-10-CM | POA: Diagnosis not present

## 2020-03-12 DIAGNOSIS — Z7901 Long term (current) use of anticoagulants: Secondary | ICD-10-CM | POA: Insufficient documentation

## 2020-03-12 DIAGNOSIS — E876 Hypokalemia: Secondary | ICD-10-CM | POA: Insufficient documentation

## 2020-03-12 DIAGNOSIS — M25562 Pain in left knee: Secondary | ICD-10-CM | POA: Insufficient documentation

## 2020-03-12 DIAGNOSIS — M25462 Effusion, left knee: Secondary | ICD-10-CM

## 2020-03-12 DIAGNOSIS — M79605 Pain in left leg: Secondary | ICD-10-CM | POA: Diagnosis not present

## 2020-03-12 LAB — CBC
HCT: 40.4 % (ref 39.0–52.0)
Hemoglobin: 12.3 g/dL — ABNORMAL LOW (ref 13.0–17.0)
MCH: 26.2 pg (ref 26.0–34.0)
MCHC: 30.4 g/dL (ref 30.0–36.0)
MCV: 86.1 fL (ref 80.0–100.0)
Platelets: 236 10*3/uL (ref 150–400)
RBC: 4.69 MIL/uL (ref 4.22–5.81)
RDW: 16.1 % — ABNORMAL HIGH (ref 11.5–15.5)
WBC: 6 10*3/uL (ref 4.0–10.5)
nRBC: 0 % (ref 0.0–0.2)

## 2020-03-12 LAB — BASIC METABOLIC PANEL
Anion gap: 16 — ABNORMAL HIGH (ref 5–15)
BUN: 8 mg/dL (ref 6–20)
CO2: 20 mmol/L — ABNORMAL LOW (ref 22–32)
Calcium: 9.2 mg/dL (ref 8.9–10.3)
Chloride: 107 mmol/L (ref 98–111)
Creatinine, Ser: 0.73 mg/dL (ref 0.61–1.24)
GFR, Estimated: >60 mL/min (ref >60)
Glucose, Bld: 87 mg/dL (ref 70–99)
Potassium: 3.1 mmol/L — ABNORMAL LOW (ref 3.5–5.1)
Sodium: 143 mmol/L (ref 135–145)

## 2020-03-12 LAB — TROPONIN I (HIGH SENSITIVITY): Troponin I (High Sensitivity): 19 ng/L — ABNORMAL HIGH (ref <18)

## 2020-03-12 NOTE — ED Triage Notes (Addendum)
Pt presents to ED BIB [facility]. Pt c/o l leg pain. Seen recently for same left AMA after being told he has infection. Also c/o CP radiates towards beack, described as pressure. EMS VSS

## 2020-03-13 DIAGNOSIS — M25562 Pain in left knee: Secondary | ICD-10-CM | POA: Diagnosis not present

## 2020-03-13 LAB — CULTURE, BLOOD (ROUTINE X 2)
Culture: NO GROWTH
Special Requests: ADEQUATE

## 2020-03-13 LAB — TROPONIN I (HIGH SENSITIVITY): Troponin I (High Sensitivity): 13 ng/L (ref <18)

## 2020-03-13 LAB — SEDIMENTATION RATE: Sed Rate: 7 mm/hr (ref 0–16)

## 2020-03-13 LAB — C-REACTIVE PROTEIN: CRP: 0.6 mg/dL (ref <1.0)

## 2020-03-13 MED ORDER — HYDROMORPHONE HCL 1 MG/ML IJ SOLN
2.0000 mg | Freq: Once | INTRAMUSCULAR | Status: AC
Start: 2020-03-13 — End: 2020-03-13
  Administered 2020-03-13: 05:00:00 2 mg via INTRAMUSCULAR
  Filled 2020-03-13: qty 2

## 2020-03-13 MED ORDER — ONDANSETRON 4 MG PO TBDP
8.0000 mg | ORAL_TABLET | Freq: Once | ORAL | Status: AC
  Administered 2020-03-13: 02:00:00 8 mg via ORAL
  Filled 2020-03-13: qty 2

## 2020-03-13 MED ORDER — POTASSIUM CHLORIDE CRYS ER 20 MEQ PO TBCR
40.0000 meq | EXTENDED_RELEASE_TABLET | Freq: Once | ORAL | Status: AC
  Administered 2020-03-13: 02:00:00 40 meq via ORAL
  Filled 2020-03-13: qty 2

## 2020-03-13 MED ORDER — HYDROMORPHONE HCL 1 MG/ML IJ SOLN
2.0000 mg | Freq: Once | INTRAMUSCULAR | Status: AC
  Administered 2020-03-13: 02:00:00 2 mg via INTRAMUSCULAR
  Filled 2020-03-13: qty 2

## 2020-03-13 MED ORDER — APIXABAN 5 MG PO TABS
5.0000 mg | ORAL_TABLET | Freq: Two times a day (BID) | ORAL | Status: DC
  Administered 2020-03-13: 02:00:00 5 mg via ORAL
  Filled 2020-03-13: qty 1

## 2020-03-13 NOTE — ED Notes (Signed)
Pt up to desk stating that he is having pain (7/10) in his left lower calf down to his foot with decreased sensation. Edema noted to the foot with pulses present. RN notified.

## 2020-03-13 NOTE — Discharge Instructions (Addendum)
Your x-rays and blood work did not show any signs of ongoing infection in your knee.  You need to follow-up with your orthopedic doctor for further evaluation and treatment.  In the meantime, continue taking your pain medication as prescribed.

## 2020-03-13 NOTE — ED Provider Notes (Signed)
[facility] EMERGENCY DEPARTMENT Provider Note   CSN: [id number] Arrival date & time: 03/12/20  1913   History Chief Complaint  Patient presents with   Leg Pain   Chest Pain    Jason [name] is a 54 y.o. male.  The history is provided by the patient.  Leg Pain Chest Pain He has history of hypertension, pulmonary embolism anticoagulated on apixaban, right above-the-knee amputation, septic arthritis of the left knee recently discharged from skilled nursing facility after completing course of IV antibiotics states that 2 days ago he noted he was not able to bend his left knee and pain in the left knee increased dramatically.  He has been taking oxycodone 30 mg without relief.  He has had fevers at home as high as 101.8, but has begin keeping his temperature down with regular doses of acetaminophen.  He had missed a follow-up appointment with his orthopedic physician because he could not get transportation from the skilled nursing facility where he was getting IV antibiotics.  He says he wants to talk with his orthopedic surgeon regarding whether amputation would be advisable.  While in the waiting room, he had a brief episode of chest pain which has resolved.  Past Medical History:  Diagnosis Date   Allergy    Anxiety    Arthritis    left shoulder   Bronchitis    Chest pain [id number]   Complication of anesthesia    DJD (degenerative joint disease) 11/29/2011   Hypertension    Neuromuscular disorder ([place])    carpal tunnel bilateral, ulner nerve surgery   PONV (postoperative nausea and vomiting)    Septic arthritis of knee, right ([place]) 11/29/2011   Spinal headache    "long time ago"    Patient Active Problem List   Diagnosis Date Noted   Osteomyelitis of left knee region [facility])    Bilateral pulmonary embolism ([place]) 01/15/2020   Encounter for central line placement    Septic arthritis ([place]) 01/11/2020   Syncope 01/11/2020   Elevated  troponin 01/11/2020   Chest pain 01/11/2020   Bright red blood per rectum 01/11/2020   Anemia 01/11/2020   Pain management contract broken 09/13/2018   Benzodiazepine dependence, continuous ([place]) 12/25/2017   Amputation stump pain ([place]) 11/18/2017   Opiate dependence ([place]) 05/06/2017   Primary insomnia 05/06/2017   Functional diarrhea 05/06/2017   Phantom limb pain ([place]) 05/06/2017   Muscle atrophy of lower extremity 05/06/2017   Other chronic pain 05/06/2017   Essential hypertension 07/14/2016   S/P AKA (above knee amputation) unilateral, right ([place]) 07/14/2016   Shoulder impingement syndrome, left 07/29/2011    Past Surgical History:  Procedure Laterality Date   ABOVE KNEE LEG AMPUTATION Right 05/26/2014   APPENDECTOMY     CHOLECYSTECTOMY     I & D EXTREMITY Left 01/12/2020   Procedure: IRRIGATION AND DEBRIDEMENT EXTREMITY;  Surgeon: Shona Needles, MD;  Location: [facility];  Service: Orthopedics;  Laterality: Left;   I & D EXTREMITY Left 01/18/2020   Procedure: IRRIGATION AND DEBRIDEMENT EXTREMITY;  Surgeon: Newt Minion, MD;  Location: [facility];  Service: Orthopedics;  Laterality: Left;   I & D EXTREMITY Left 01/27/2020   Procedure: EXCISIONAL DEBRIDEMENT LEFT KNEE WITH ANTIBIOTIC BEADS;  Surgeon: Newt Minion, MD;  Location: [facility];  Service: Orthopedics;  Laterality: Left;   IRRIGATION AND DEBRIDEMENT KNEE  11/26/2011   Procedure: IRRIGATION AND DEBRIDEMENT KNEE;  Surgeon: Kerin Salen, MD;  Location: [facility];  Service: Orthopedics;  Laterality: Right;   KNEE ARTHROSCOPY  10/24/2011   Procedure: ARTHROSCOPY KNEE;  Surgeon: Nestor Lewandowsky, MD;  [facility];  Service: Orthopedics;  Laterality: Right;   KNEE SURGERY     7 knee surgeries on right, and 4 knee surgeries left   LEFT HEART CATH AND CORONARY ANGIOGRAPHY N/A 07/14/2016   Procedure: Left Heart Cath and Coronary Angiography;  Surgeon: Lyn Records, MD;  Location: [facility] INVASIVE CV LAB;  Service:  Cardiovascular;  Laterality: N/A;   SHOULDER ARTHROSCOPY  07/29/2011   Procedure: ARTHROSCOPY SHOULDER;  Surgeon: Kathryne Hitch, MD;  Location: [facility] OR;  Service: Orthopedics;  Laterality: Left;  Left shoulder arthroscopy with minimal debridement, left wrist steroid injection   SHOULDER SURGERY     3 surgeries on right, 2 surgeries on left   ULNAR NERVE REPAIR         Family History  Problem Relation Age of Onset   Heart attack Father    COPD Father    Diabetes Father    Hypertension Father    Heart attack Sister    Arrhythmia Sister        Long QT syndrome, PPM   Hypertension Mother    Drug abuse Brother     Social History   Tobacco Use   Smoking status: Never Smoker   Smokeless tobacco: Never Used  Vaping Use   Vaping Use: Never used  Substance Use Topics   Alcohol use: No   Drug use: No    Home Medications Prior to Admission medications   Medication Sig Start Date End Date Taking? Authorizing Provider  apixaban (ELIQUIS) 5 MG TABS tablet Take 1 tablet (5 mg total) by mouth 2 (two) times daily. 02/01/20   Glade Lloyd, MD  clonazePAM (KLONOPIN) 1 MG tablet Take 1 mg by mouth 3 (three) times daily.    [provider]  docusate sodium (COLACE) 100 MG capsule Take 1 capsule (100 mg total) by mouth 2 (two) times daily. Patient not taking: No sig reported 02/01/20   Glade Lloyd, MD  EPINEPHrine 0.3 mg/0.3 mL IJ SOAJ injection Inject 0.3 mg into the muscle once as needed for anaphylaxis. 05/28/16   [provider]  gabapentin (NEURONTIN) 300 MG capsule Take 1 capsule (300 mg total) by mouth 3 (three) times daily. 02/01/20   Glade Lloyd, MD  Lactobacillus (ACIDOPHILUS) TABS Take 1 tablet by mouth daily. 02/07/20   [provider]  methocarbamol (ROBAXIN) 500 MG tablet Take 2 tablets (1,000 mg total) by mouth every 8 (eight) hours. 02/01/20   Glade Lloyd, MD  metoprolol tartrate (LOPRESSOR) 25 MG tablet Take 0.5  tablets (12.5 mg total) by mouth 2 (two) times daily. 02/01/20   Glade Lloyd, MD  oxyCODONE 20 MG TABS Take 1 tablet (20 mg total) by mouth every 4 (four) hours as needed (pain score 7-10). 02/01/20   Glade Lloyd, MD  polyethylene glycol (MIRALAX / GLYCOLAX) 17 g packet Take 17 g by mouth daily as needed for mild constipation. Patient not taking: No sig reported 02/01/20   Glade Lloyd, MD  zolpidem (AMBIEN) 10 MG tablet Take 10 mg by mouth at bedtime as needed for sleep.    [provider]    Allergies    Ivp dye [iodinated diagnostic agents], Metrizamide, Other, Reglan [metoclopramide], Shellfish-derived products, Codeine, Lidocaine, Methadone, Morphine, Propoxyphene, Toradol [ketorolac tromethamine], Iodine, Tape, and Vancomycin  Review of Systems   Review of Systems  Cardiovascular: Positive for chest pain.  All other systems reviewed and are negative.   Physical Exam Updated Vital Signs BP (!) 142/95 (BP Location: Right Arm)    Pulse 92    Temp 98.3 F (36.8 C) (Oral)    Resp 18    SpO2 97%   Physical Exam Vitals and nursing note reviewed.   54 year old male, resting comfortably and in no acute distress. Vital signs are significant for mildly elevated blood pressure. Oxygen saturation is 97%, which is normal. Head is normocephalic and atraumatic. PERRLA, EOMI. Oropharynx is clear. Neck is nontender and supple without adenopathy or JVD. Back is nontender and there is no CVA tenderness. Lungs are clear without rales, wheezes, or rhonchi. Chest is nontender. Heart has regular rate and rhythm without murmur. Abdomen is soft, flat, nontender without masses or hepatosplenomegaly and peristalsis is normoactive. Extremities: Right above-the-knee amputation.  Surgical scars present left knee with generalized swelling and induration which I feel is mostly scar tissue, very difficult to evaluate for effusion.  No erythema or warmth.  Range of motion left knee markedly  diminished. Skin is warm and dry without rash. Neurologic: Mental status is normal, cranial nerves are intact, there are no motor or sensory deficits.  ED Results / Procedures / Treatments   Labs (all labs ordered are listed, but only abnormal results are displayed) Labs Reviewed  BASIC METABOLIC PANEL - Abnormal; Notable for the following components:      Result Value   Potassium 3.1 (*)    CO2 20 (*)    Anion gap 16 (*)    All other components within normal limits  CBC - Abnormal; Notable for the following components:   Hemoglobin 12.3 (*)    RDW 16.1 (*)    All other components within normal limits  TROPONIN I (HIGH SENSITIVITY) - Abnormal; Notable for the following components:   Troponin I (High Sensitivity) 19 (*)    All other components within normal limits  TROPONIN I (HIGH SENSITIVITY)    EKG EKG Interpretation  Date/Time:  Monday March 12 2020 19:54:47 EST Ventricular Rate:  83 PR Interval:  156 QRS Duration: 102 QT Interval:  370 QTC Calculation: 434 R Axis:   57 Text Interpretation: Normal sinus rhythm with sinus arrhythmia Normal ECG When compared with ECG of 03/08/2020, HEART RATE has increased Confirmed by Delora Fuel ([id number]) on 03/13/2020 12:50:02 AM   Radiology DG Knee 2 Views Left  Result Date: 03/12/2020 CLINICAL DATA:  Knee pain EXAM: LEFT KNEE - 1-2 VIEW COMPARISON:  03/08/2020, 01/11/2020 FINDINGS: Generalized soft tissue swelling. No significant knee effusion. Subarticular erosive change at the medial femoral condyle and medial tibial plateau corresponding to history of septic arthritis and osteomyelitis. Marginal erosion at the lateral aspect of the knee joint is unchanged. Tricompartment arthritis. IMPRESSION: Similar finding as compared with 03/08/2020. Irregular medial joint space with erosive change at the medial femoral condyle and medial tibial plateau corresponding to history of septic arthritis and osteomyelitis. Similar marginal erosion  lateral aspect of the knee joint. Generalized soft tissue swelling without sizable knee effusion. Tricompartment arthritis. Electronically Signed   By: Donavan Foil M.D.   On: 03/12/2020 20:37    Procedures Procedures   Medications Ordered in ED Medications  apixaban (ELIQUIS) tablet 5 mg (5 mg Oral Given 03/13/20 0218)  HYDROmorphone (DILAUDID) injection 2 mg (2 mg Intramuscular Given 03/13/20 0218)  ondansetron (ZOFRAN-ODT) disintegrating tablet 8 mg (8 mg Oral Given 03/13/20 0219)  potassium chloride SA (KLOR-CON) CR tablet 40 mEq (40  mEq Oral Given 03/13/20 0218)  HYDROmorphone (DILAUDID) injection 2 mg (2 mg Intramuscular Given 03/13/20 0434)    ED Course  I have reviewed the triage vital signs and the nursing notes.  Pertinent labs & imaging results that were available during my care of the patient were reviewed by me and considered in my medical decision making (see chart for details).  MDM Rules/Calculators/A&P Exacerbation of chronic pain and left knee, status post septic arthritis and osteomyelitis.  X-rays were obtained which were essentially unchanged from prior.  ECG shows no acute changes.  Labs show normal WBC, mild hypokalemia and borderline elevated anion gap.  Initial troponin is mildly elevated at 13, follow-up troponin has actually dropped to 13.  Review of old records does show recent hospitalization for his septic left knee, also pulmonary embolism.  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  In the ED, he is afebrile.  Will check CRP and sedimentation rate and, if not abnormal, will refer back to his orthopedic physician for follow-up.  CRP and sedimentation rate are normal.  Patient is requesting to stay in the emergency department to have Dr. Sharol Given come and evaluate him here.  Advised that this is not an option and that he needs to follow-up with Dr. Sharol Given in his office.  He is to call today for an appointment as soon as possible.  Final  Clinical Impression(s) / ED Diagnoses Final diagnoses:  Pain and swelling of left knee  Hypokalemia  Normochromic normocytic anemia    Rx / DC Orders ED Discharge Orders    None       Delora Fuel, MD [id number] [phone]

## 2020-03-13 NOTE — ED Notes (Signed)
Patient verbalized understanding of dc instructions, vss, ambulatory with nad.   

## 2020-03-13 NOTE — ED Notes (Signed)
ED Provider at bedside. 

## 2020-03-14 ENCOUNTER — Telehealth: Payer: Self-pay

## 2020-03-14 NOTE — Telephone Encounter (Signed)
Called and lm on vm to advise that Dr. Lajoyce Corners wants to see pt in follow up from his ER visit. To call back to schedule. Advised today or tomorrow office is closed on Friday. Will hold message pending return call.

## 2020-03-15 ENCOUNTER — Emergency Department (HOSPITAL_COMMUNITY)
Admission: EM | Admit: 2020-03-15 | Discharge: 2020-03-16 | Disposition: A | Discharge status: Home / Self Care | Payer: Medicare Other | Attending: Emergency Medicine | Admitting: Emergency Medicine

## 2020-03-15 ENCOUNTER — Other Ambulatory Visit: Payer: Self-pay

## 2020-03-15 ENCOUNTER — Emergency Department (HOSPITAL_COMMUNITY): Payer: Medicare Other

## 2020-03-15 DIAGNOSIS — Z7901 Long term (current) use of anticoagulants: Secondary | ICD-10-CM | POA: Diagnosis not present

## 2020-03-15 DIAGNOSIS — R0600 Dyspnea, unspecified: Secondary | ICD-10-CM | POA: Diagnosis not present

## 2020-03-15 DIAGNOSIS — Z049 Encounter for examination and observation for unspecified reason: Secondary | ICD-10-CM | POA: Diagnosis not present

## 2020-03-15 DIAGNOSIS — M79605 Pain in left leg: Secondary | ICD-10-CM | POA: Diagnosis not present

## 2020-03-15 DIAGNOSIS — G894 Chronic pain syndrome: Secondary | ICD-10-CM

## 2020-03-15 DIAGNOSIS — R079 Chest pain, unspecified: Secondary | ICD-10-CM | POA: Insufficient documentation

## 2020-03-15 DIAGNOSIS — E876 Hypokalemia: Secondary | ICD-10-CM | POA: Diagnosis not present

## 2020-03-15 DIAGNOSIS — M25562 Pain in left knee: Secondary | ICD-10-CM | POA: Insufficient documentation

## 2020-03-15 DIAGNOSIS — Z79899 Other long term (current) drug therapy: Secondary | ICD-10-CM | POA: Diagnosis not present

## 2020-03-15 DIAGNOSIS — R0789 Other chest pain: Secondary | ICD-10-CM | POA: Diagnosis not present

## 2020-03-15 DIAGNOSIS — R0602 Shortness of breath: Secondary | ICD-10-CM | POA: Diagnosis not present

## 2020-03-15 DIAGNOSIS — I10 Essential (primary) hypertension: Secondary | ICD-10-CM | POA: Diagnosis not present

## 2020-03-15 DIAGNOSIS — R457 State of emotional shock and stress, unspecified: Secondary | ICD-10-CM | POA: Diagnosis not present

## 2020-03-15 LAB — BASIC METABOLIC PANEL
Anion gap: 12 (ref 5–15)
BUN: 10 mg/dL (ref 6–20)
CO2: 23 mmol/L (ref 22–32)
Calcium: 8.8 mg/dL — ABNORMAL LOW (ref 8.9–10.3)
Chloride: 107 mmol/L (ref 98–111)
Creatinine, Ser: 0.82 mg/dL (ref 0.61–1.24)
GFR, Estimated: >60 mL/min (ref >60)
Glucose, Bld: 99 mg/dL (ref 70–99)
Potassium: 3.1 mmol/L — ABNORMAL LOW (ref 3.5–5.1)
Sodium: 142 mmol/L (ref 135–145)

## 2020-03-15 LAB — CBC
HCT: 37.5 % — ABNORMAL LOW (ref 39.0–52.0)
Hemoglobin: 11.5 g/dL — ABNORMAL LOW (ref 13.0–17.0)
MCH: 25.8 pg — ABNORMAL LOW (ref 26.0–34.0)
MCHC: 30.7 g/dL (ref 30.0–36.0)
MCV: 84.3 fL (ref 80.0–100.0)
Platelets: 274 10*3/uL (ref 150–400)
RBC: 4.45 MIL/uL (ref 4.22–5.81)
RDW: 16 % — ABNORMAL HIGH (ref 11.5–15.5)
WBC: 7.4 10*3/uL (ref 4.0–10.5)
nRBC: 0 % (ref 0.0–0.2)

## 2020-03-15 LAB — TROPONIN I (HIGH SENSITIVITY): Troponin I (High Sensitivity): 14 ng/L (ref <18)

## 2020-03-15 NOTE — ED Triage Notes (Addendum)
Pt presents to ED [facility]. Pt c/o R CP worsens w/ breathing. Pt reports difficulty taking deep breathe, resp e/u.Seen here 12/27 for same, reports worsened since then. Pt reports hx of PE, on blood thinner. Pt refusing BKA after being told he has infection in L leg

## 2020-03-15 NOTE — Telephone Encounter (Signed)
I called another message on vm to advise to call the office so that we can follow up for ER

## 2020-03-16 ENCOUNTER — Emergency Department (HOSPITAL_COMMUNITY): Payer: Medicare Other

## 2020-03-16 DIAGNOSIS — R079 Chest pain, unspecified: Secondary | ICD-10-CM | POA: Diagnosis not present

## 2020-03-16 LAB — D-DIMER, QUANTITATIVE: D-Dimer, Quant: 1.13 ug/mL-FEU — ABNORMAL HIGH (ref 0.00–0.50)

## 2020-03-16 LAB — TROPONIN I (HIGH SENSITIVITY): Troponin I (High Sensitivity): 10 ng/L (ref <18)

## 2020-03-16 MED ORDER — HYDROCORTISONE NA SUCCINATE PF 250 MG IJ SOLR
200.0000 mg | Freq: Once | INTRAMUSCULAR | Status: AC
  Administered 2020-03-16: 200 mg via INTRAVENOUS
  Filled 2020-03-16: qty 200

## 2020-03-16 MED ORDER — SODIUM CHLORIDE 0.9% FLUSH
10.0000 mL | Freq: Two times a day (BID) | INTRAVENOUS | Status: DC
  Administered 2020-03-16: 10 mL

## 2020-03-16 MED ORDER — HYDROMORPHONE HCL 1 MG/ML IJ SOLN
1.0000 mg | Freq: Once | INTRAMUSCULAR | Status: DC
  Filled 2020-03-16: qty 1

## 2020-03-16 MED ORDER — POTASSIUM CHLORIDE CRYS ER 20 MEQ PO TBCR
40.0000 meq | EXTENDED_RELEASE_TABLET | Freq: Once | ORAL | Status: AC
  Administered 2020-03-16: 40 meq via ORAL
  Filled 2020-03-16: qty 2

## 2020-03-16 MED ORDER — ONDANSETRON HCL 4 MG/2ML IJ SOLN
4.0000 mg | Freq: Once | INTRAMUSCULAR | Status: AC
  Administered 2020-03-16: 4 mg via INTRAVENOUS
  Filled 2020-03-16: qty 2

## 2020-03-16 MED ORDER — IOHEXOL 350 MG/ML SOLN
75.0000 mL | Freq: Once | INTRAVENOUS | Status: AC | PRN
  Administered 2020-03-16: 75 mL via INTRAVENOUS

## 2020-03-16 MED ORDER — DIPHENHYDRAMINE HCL 50 MG/ML IJ SOLN
50.0000 mg | Freq: Once | INTRAMUSCULAR | Status: AC
  Filled 2020-03-16: qty 1

## 2020-03-16 MED ORDER — HYDROMORPHONE HCL 1 MG/ML IJ SOLN
1.0000 mg | Freq: Once | INTRAMUSCULAR | Status: AC
  Administered 2020-03-16: 1 mg via INTRAVENOUS
  Filled 2020-03-16: qty 1

## 2020-03-16 MED ORDER — HYDROMORPHONE HCL 1 MG/ML IJ SOLN
2.0000 mg | Freq: Once | INTRAMUSCULAR | Status: AC
  Administered 2020-03-16: 2 mg via INTRAVENOUS
  Filled 2020-03-16: qty 2

## 2020-03-16 MED ORDER — DIPHENHYDRAMINE HCL 25 MG PO CAPS
50.0000 mg | ORAL_CAPSULE | Freq: Once | ORAL | Status: AC
  Administered 2020-03-16: 50 mg via ORAL
  Filled 2020-03-16: qty 2

## 2020-03-16 MED ORDER — SODIUM CHLORIDE 0.9% FLUSH: 10.0000 mL | INTRAVENOUS | Status: DC | PRN

## 2020-03-16 NOTE — ED Provider Notes (Signed)
[facility] EMERGENCY DEPARTMENT Provider Note   CSN: [id number] Arrival date & time: 03/15/20  2112     History Chief Complaint  Patient presents with  . Chest Pain    Jason [name] is a 54 y.o. male.  Patient presents to the emergency department for evaluation of multiple problems.  Patient has a history of chronic left knee pain secondary to infection.  He is pending leg amputation.  He reports that he has been having severe pain in the knee.  Additionally, however, he also complains of chest pain.  Patient reports the pain in the center of his chest that worsens when he takes a deep breath.  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  He takes Eliquis but has missed some doses.  He reports that he has had nausea and vomiting for the last couple of days and does not think he has kept down any of his meds.        Past Medical History:  Diagnosis Date  . Allergy   . Anxiety   . Arthritis    left shoulder  . Bronchitis   . Chest pain 07/14/2016  . Complication of anesthesia   . DJD (degenerative joint disease) 11/29/2011  . Hypertension   . Neuromuscular disorder ([place])    carpal tunnel bilateral, ulner nerve surgery  . PONV (postoperative nausea and vomiting)   . Septic arthritis of knee, right ([place]) 11/29/2011  . Spinal headache    "long time ago"    Patient Active Problem List   Diagnosis Date Noted  . Osteomyelitis of left knee region ([place])   . Bilateral pulmonary embolism ([place]) 01/15/2020  . Encounter for central line placement   . Septic arthritis ([place]) 01/11/2020  . Syncope 01/11/2020  . Elevated troponin 01/11/2020  . Chest pain 01/11/2020  . Bright red blood per rectum 01/11/2020  . Anemia 01/11/2020  . Pain management contract broken 09/13/2018  . Benzodiazepine dependence, continuous ([place]) 12/25/2017  . Amputation stump pain ([place]) 11/18/2017  . Opiate dependence ([place]) 05/06/2017  . Primary insomnia 05/06/2017  . Functional  diarrhea 05/06/2017  . Phantom limb pain ([place]) 05/06/2017  . Muscle atrophy of lower extremity 05/06/2017  . Other chronic pain 05/06/2017  . Essential hypertension 07/14/2016  . S/P AKA (above knee amputation) unilateral, right ([place]) 07/14/2016  . Shoulder impingement syndrome, left 07/29/2011    Past Surgical History:  Procedure Laterality Date  . 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 APPENDECTOMY    . CHOLECYSTECTOMY    . I & D EXTREMITY Left 01/12/2020   Procedure: IRRIGATION AND DEBRIDEMENT EXTREMITY;  Surgeon: Shona Needles, MD;  Location: [place];  Service: Orthopedics;  Laterality: Left;  . I & D EXTREMITY Left 01/18/2020   Procedure: IRRIGATION AND DEBRIDEMENT EXTREMITY;  Surgeon: Newt Minion, MD;  Location: [place];  Service: Orthopedics;  Laterality: Left;  . I & D EXTREMITY Left 01/27/2020   Procedure: EXCISIONAL DEBRIDEMENT LEFT KNEE WITH ANTIBIOTIC BEADS;  Surgeon: Newt Minion, MD;  Location: [place];  Service: Orthopedics;  Laterality: Left;  . IRRIGATION AND DEBRIDEMENT KNEE  11/26/2011   Procedure: IRRIGATION AND DEBRIDEMENT KNEE;  Surgeon: Kerin Salen, MD;  Location: [place];  Service: Orthopedics;  Laterality: Right;  . KNEE ARTHROSCOPY  10/24/2011   Procedure: ARTHROSCOPY KNEE;  Surgeon: Kerin Salen, MD;  Location: [place];  Service: Orthopedics;  Laterality: Right;  . KNEE SURGERY     7  knee surgeries on right, and 4 knee surgeries left  . LEFT HEART CATH AND CORONARY ANGIOGRAPHY N/A 07/14/2016   Procedure: Left Heart Cath and Coronary Angiography;  Surgeon: Belva Crome, MD;  Location: [place] [facility];  Service: Cardiovascular;  Laterality: N/A;  . SHOULDER ARTHROSCOPY  07/29/2011   Procedure: ARTHROSCOPY SHOULDER;  Surgeon: Mcarthur Rossetti, MD;  Location: [place];  Service: Orthopedics;  Laterality: Left;  Left shoulder arthroscopy with minimal debridement, left wrist steroid injection  . SHOULDER SURGERY     3 surgeries on right, 2 surgeries on left   . ULNAR NERVE REPAIR         Family History  Problem Relation Age of Onset  . Heart attack Father   . COPD Father   . Diabetes Father   . Hypertension Father   . Heart attack Sister   . Arrhythmia Sister        Long QT syndrome, PPM  . Hypertension Mother   . Drug abuse Brother     Social History   Tobacco Use  . Smoking status: Never Smoker  . Smokeless tobacco: Never Used  Vaping Use  . Vaping Use: Never used  Substance Use Topics  . Alcohol use: No  . Drug use: No    Home Medications Prior to Admission medications   Medication Sig Start Date End Date Taking? Authorizing Provider  apixaban (ELIQUIS) 5 MG TABS tablet Take 1 tablet (5 mg total) by mouth 2 (two) times daily. 02/01/20   Aline August, MD  clonazePAM (KLONOPIN) 1 MG tablet Take 1 mg by mouth 3 (three) times daily.    [provider]  docusate sodium (COLACE) 100 MG capsule Take 1 capsule (100 mg total) by mouth 2 (two) times daily. Patient not taking: No sig reported 02/01/20   Aline August, MD  EPINEPHrine 0.3 mg/0.3 mL IJ SOAJ injection Inject 0.3 mg into the muscle once as needed for anaphylaxis. 05/28/16   [provider]  gabapentin (NEURONTIN) 300 MG capsule Take 1 capsule (300 mg total) by mouth 3 (three) times daily. 02/01/20   Aline August, MD  Lactobacillus (ACIDOPHILUS) TABS Take 1 tablet by mouth daily. 02/07/20   [provider]  methocarbamol (ROBAXIN) 500 MG tablet Take 2 tablets (1,000 mg total) by mouth every 8 (eight) hours. Patient not taking: Reported on 03/13/2020 02/01/20   Aline August, MD  metoprolol tartrate (LOPRESSOR) 25 MG tablet Take 0.5 tablets (12.5 mg total) by mouth 2 (two) times daily. 02/01/20   Aline August, MD  naloxone [facility]) 4 MG/0.1ML LIQD nasal spray kit Place 1 spray into the nose as needed (overdose).    [provider]  naproxen sodium (ALEVE) 220 MG tablet Take 440 mg by mouth daily as needed (pain).    [provider]  oxycodone (ROXICODONE) 30 MG immediate release tablet Take 30 mg by mouth every 6 (six) hours as needed for pain.    [provider]  polyethylene glycol (MIRALAX / GLYCOLAX) 17 g packet Take 17 g by mouth daily as needed for mild constipation. Patient not taking: No sig reported 02/01/20   Aline August, MD  zolpidem (AMBIEN CR) 12.5 MG CR tablet Take 12.5 mg by mouth at bedtime.    [provider]    Allergies    Ivp dye [iodinated diagnostic agents], Metrizamide, Other, Reglan [metoclopramide], Shellfish-derived products, Codeine, Lidocaine, Methadone, Morphine, Propoxyphene, Toradol [ketorolac tromethamine], Iodine, Tape, and Vancomycin  Review of Systems   Review  of Systems  Cardiovascular: Positive for chest pain.  Gastrointestinal: Positive for abdominal pain, nausea and vomiting.  Musculoskeletal: Positive for arthralgias.  All other systems reviewed and are negative.   Physical Exam Updated Vital Signs BP 138/72 (BP Location: Right Arm)   Pulse 74   Temp 99.1 F (37.3 C) (Oral)   Resp 16   SpO2 99%   Physical Exam Vitals and nursing note reviewed.  Constitutional:      General: He is not in acute distress.    Appearance: Normal appearance. He is well-developed and well-nourished.  HENT:     Head: Normocephalic and atraumatic.     Right Ear: Hearing normal.     Left Ear: Hearing normal.     Nose: Nose normal.     Mouth/Throat:     Mouth: Oropharynx is clear and moist and mucous membranes are normal.  Eyes:     Extraocular Movements: EOM normal.     Conjunctiva/sclera: Conjunctivae normal.     Pupils: Pupils are equal, round, and reactive to light.  Cardiovascular:     Rate and Rhythm: Regular rhythm.     Heart sounds: S1 normal and S2 normal. No murmur heard. No friction rub. No gallop.   Pulmonary:     Effort: Pulmonary effort is normal. No respiratory distress.     Breath sounds: Normal breath sounds.  Chest:     Chest  wall: No tenderness.  Abdominal:     General: Bowel sounds are normal.     Palpations: Abdomen is soft. There is no hepatosplenomegaly.     Tenderness: There is no abdominal tenderness. There is no guarding or rebound. Negative signs include Murphy's sign and McBurney's sign.     Hernia: No hernia is present.  Musculoskeletal:     Cervical back: Normal range of motion and neck supple.     Left knee: Decreased range of motion. Tenderness present.  Skin:    General: Skin is warm, dry and intact.     Findings: No rash.     Nails: There is no cyanosis.  Neurological:     Mental Status: He is alert and oriented to person, place, and time.     GCS: GCS eye subscore is 4. GCS verbal subscore is 5. GCS motor subscore is 6.     Cranial Nerves: No cranial nerve deficit.     Sensory: No sensory deficit.     Coordination: Coordination normal.     Deep Tendon Reflexes: Strength normal.  Psychiatric:        Mood and Affect: Mood and affect normal.        Speech: Speech normal.        Behavior: Behavior normal.        Thought Content: Thought content normal.     ED Results / Procedures / Treatments   Labs (all labs ordered are listed, but only abnormal results are displayed) Labs Reviewed  BASIC METABOLIC PANEL - Abnormal; Notable for the following components:      Result Value   Potassium 3.1 (*)    Calcium 8.8 (*)    All other components within normal limits  CBC - Abnormal; Notable for the following components:   Hemoglobin 11.5 (*)    HCT 37.5 (*)    MCH 25.8 (*)    RDW 16.0 (*)    All other components within normal limits  D-DIMER, QUANTITATIVE (NOT AT [facility]) - Abnormal; Notable for the following components:   D-Dimer, Quant 1.13 (*)      All other components within normal limits  TROPONIN I (HIGH SENSITIVITY)  TROPONIN I (HIGH SENSITIVITY)    EKG None  Radiology DG Chest 2 View  Result Date: 03/15/2020 CLINICAL DATA:  Shortness of breath.  Right-sided chest pain. EXAM:  CHEST - 2 VIEW COMPARISON:  Radiograph 1 week ago 03/08/2020.  CT 01/14/2020 FINDINGS: The cardiomediastinal contours are normal. The lungs are clear. Pulmonary vasculature is normal. No consolidation, pleural effusion, or pneumothorax. No acute osseous abnormalities are seen. Minimal midthoracic spondylosis. IMPRESSION: No acute chest findings. Electronically Signed   By: Keith Rake M.D.   On: 03/15/2020 22:05    Procedures Procedures (including critical care time)  Medications Ordered in ED Medications  sodium chloride flush (NS) 0.9 % injection 10-40 mL (10 mLs Intracatheter Given 03/16/20 0216)  sodium chloride flush (NS) 0.9 % injection 10-40 mL (has no administration in time range)  ondansetron (ZOFRAN) injection 4 mg (4 mg Intravenous Given 03/16/20 0313)  hydrocortisone sodium succinate (SOLU-CORTEF) injection 200 mg (200 mg Intravenous Given 03/16/20 0314)  diphenhydrAMINE (BENADRYL) capsule 50 mg (50 mg Oral Given 03/16/20 0558)    Or  diphenhydrAMINE (BENADRYL) injection 50 mg ( Intravenous See Alternative 03/16/20 0558)  HYDROmorphone (DILAUDID) injection 2 mg (2 mg Intravenous Given 03/16/20 0314)    ED Course  I have reviewed the triage vital signs and the nursing notes.  Pertinent labs & imaging results that were available during my care of the patient were reviewed by me and considered in my medical decision making (see chart for details).    MDM Rules/Calculators/A&P                          Patient with left knee pain that appears to be chronic in nature.  He does have chronic pain disorder and is opioid dependent.  Patient is under the care of orthopedics and he is pending above-the-knee amputation of the left leg.  He reports that surgery is on for next week.  I do not feel that there is any significant change in his condition with any, no immediate interventions necessary.  He does not appear septic.  He is afebrile.  Patient is, however, complaining of  pleuritic chest pain.  He does have a history of PE and has not been taking his Eliquis daily.  His D-dimer is elevated.  He has pain and swelling of the knee but there is not any significant tenderness, pain or swelling of the calf.  Doubt recurrent clot.  He will require CT angiography because of the elevated D-dimer which is nonspecific.  He is allergic to IV dye and therefore currently receiving premedication. Will sign out to oncoming ED physician, anticipate discharge if PE study negative.  Final Clinical Impression(s) / ED Diagnoses Final diagnoses:  Chest pain, unspecified type    Rx / DC Orders ED Discharge Orders    None       Jatasia Gundrum, Gwenyth Allegra, MD 03/16/20 [phone]

## 2020-03-16 NOTE — ED Notes (Signed)
Transported to CT 

## 2020-03-16 NOTE — ED Notes (Signed)
Stuck patients x2 no blood return notified nurse BOBBY RN

## 2020-03-16 NOTE — ED Provider Notes (Signed)
Signed out by Dr  Blinda Leatherwood at 0730 to d/c to home if/when CT neg for PE.  CT is neg for PE.   Reviewed nursing notes and prior charts for additional history. Prior cardiac cath with normal coronaries.  Pt is currently sleeping comfortably. Easily aroused. CT results discussed w pt.   Vitals are normal. Pt comfortable, no distress. Pt currently appears stable for d/c.   Rec pcp f/u.  Return precautions provided.      Cathren Laine, MD 03/16/20 [phone]

## 2020-03-16 NOTE — Discharge Instructions (Addendum)
It was our pleasure to provide your ER care today - we hope that you feel better.  Your ct scan looks good - no PE was noted.   Follow up with your doctor in the coming week - call office to arrange appointment. Follow up with your orthopedist closely as well, as planned.   From today's labs, your potassium level is low (3.1) - eat plenty of fruits and vegetables, and follow up with your doctor in 1-2 weeks.   Return to ER if worse, new symptoms, fevers, increased trouble breathing, or other concern.  You were given pain medication in the ER - no driving for the next 12 hours.

## 2020-03-16 NOTE — ED Notes (Signed)
IV team nurse at bedside. 

## 2020-03-16 NOTE — ED Notes (Signed)
Return from ct.

## 2020-03-20 NOTE — Telephone Encounter (Signed)
I have left several messages can you please continue to reach out to the pt for follow up

## 2020-03-21 ENCOUNTER — Encounter (HOSPITAL_COMMUNITY): Payer: Self-pay | Admitting: *Deleted

## 2020-03-21 ENCOUNTER — Emergency Department (HOSPITAL_COMMUNITY): Payer: Medicare Other

## 2020-03-21 ENCOUNTER — Other Ambulatory Visit: Payer: Self-pay

## 2020-03-21 ENCOUNTER — Emergency Department (HOSPITAL_COMMUNITY)
Admission: EM | Admit: 2020-03-21 | Discharge: 2020-03-21 | Disposition: A | Discharge status: Home / Self Care | Payer: Medicare Other | Attending: Emergency Medicine | Admitting: Emergency Medicine

## 2020-03-21 DIAGNOSIS — G8929 Other chronic pain: Secondary | ICD-10-CM | POA: Insufficient documentation

## 2020-03-21 DIAGNOSIS — Z9114 Patient's other noncompliance with medication regimen: Secondary | ICD-10-CM | POA: Diagnosis not present

## 2020-03-21 DIAGNOSIS — Z79899 Other long term (current) drug therapy: Secondary | ICD-10-CM | POA: Diagnosis not present

## 2020-03-21 DIAGNOSIS — M25562 Pain in left knee: Secondary | ICD-10-CM | POA: Diagnosis not present

## 2020-03-21 DIAGNOSIS — Z86711 Personal history of pulmonary embolism: Secondary | ICD-10-CM | POA: Insufficient documentation

## 2020-03-21 DIAGNOSIS — R0602 Shortness of breath: Secondary | ICD-10-CM | POA: Diagnosis not present

## 2020-03-21 DIAGNOSIS — I10 Essential (primary) hypertension: Secondary | ICD-10-CM | POA: Insufficient documentation

## 2020-03-21 DIAGNOSIS — Z20822 Contact with and (suspected) exposure to covid-19: Secondary | ICD-10-CM | POA: Diagnosis not present

## 2020-03-21 LAB — BASIC METABOLIC PANEL
Anion gap: 12 (ref 5–15)
BUN: 13 mg/dL (ref 6–20)
CO2: 24 mmol/L (ref 22–32)
Calcium: 8.7 mg/dL — ABNORMAL LOW (ref 8.9–10.3)
Chloride: 104 mmol/L (ref 98–111)
Creatinine, Ser: 0.7 mg/dL (ref 0.61–1.24)
GFR, Estimated: >60 mL/min (ref >60)
Glucose, Bld: 98 mg/dL (ref 70–99)
Potassium: 3.5 mmol/L (ref 3.5–5.1)
Sodium: 140 mmol/L (ref 135–145)

## 2020-03-21 LAB — RESP PANEL BY RT-PCR (FLU A&B, COVID) ARPGX2
Influenza A by PCR: NEGATIVE
Influenza B by PCR: NEGATIVE
SARS Coronavirus 2 by RT PCR: NEGATIVE

## 2020-03-21 LAB — CBC
HCT: 37.1 % — ABNORMAL LOW (ref 39.0–52.0)
Hemoglobin: 11.4 g/dL — ABNORMAL LOW (ref 13.0–17.0)
MCH: 26 pg (ref 26.0–34.0)
MCHC: 30.7 g/dL (ref 30.0–36.0)
MCV: 84.5 fL (ref 80.0–100.0)
Platelets: 339 10*3/uL (ref 150–400)
RBC: 4.39 MIL/uL (ref 4.22–5.81)
RDW: 16.7 % — ABNORMAL HIGH (ref 11.5–15.5)
WBC: 6.7 10*3/uL (ref 4.0–10.5)
nRBC: 0 % (ref 0.0–0.2)

## 2020-03-21 MED ORDER — APIXABAN 5 MG PO TABS: 5.0000 mg | ORAL_TABLET | Freq: Two times a day (BID) | ORAL | 0 refills | Status: DC

## 2020-03-21 MED ORDER — ALBUTEROL SULFATE HFA 108 (90 BASE) MCG/ACT IN AERS: 2.0000 | INHALATION_SPRAY | RESPIRATORY_TRACT | Status: DC | PRN

## 2020-03-21 MED ORDER — ONDANSETRON HCL 4 MG/2ML IJ SOLN: 4.0000 mg | Freq: Once | INTRAMUSCULAR | Status: DC

## 2020-03-21 MED ORDER — FENTANYL CITRATE (PF) 100 MCG/2ML IJ SOLN: 50.0000 ug | Freq: Once | INTRAMUSCULAR | Status: DC

## 2020-03-21 MED ORDER — HYDROMORPHONE HCL 1 MG/ML IJ SOLN
2.0000 mg | Freq: Once | INTRAMUSCULAR | Status: AC
  Administered 2020-03-21: 2 mg via INTRAMUSCULAR
  Filled 2020-03-21: qty 2

## 2020-03-21 MED ORDER — DIPHENHYDRAMINE HCL 50 MG/ML IJ SOLN: 50.0000 mg | Freq: Once | INTRAMUSCULAR | Status: DC

## 2020-03-21 MED ORDER — SODIUM CHLORIDE 0.9 % IV BOLUS: 1000.0000 mL | Freq: Once | INTRAVENOUS | Status: DC

## 2020-03-21 MED ORDER — HYDROCORTISONE NA SUCCINATE PF 250 MG IJ SOLR: 200.0000 mg | Freq: Once | INTRAMUSCULAR | Status: DC

## 2020-03-21 MED ORDER — DIPHENHYDRAMINE HCL 25 MG PO CAPS: 50.0000 mg | ORAL_CAPSULE | Freq: Once | ORAL | Status: DC

## 2020-03-21 NOTE — ED Triage Notes (Signed)
Via [facility] from home c/o  left knee pain (has been eval for the same recently) Tonight was Sleeping on couch, had sharp pain on the right side and shortness of breath. All those symptoms resolved, only c/o left knee pain now. 153/106, hr 88, rr 18, 98% RA, cbg 103, temp 97.3.

## 2020-03-21 NOTE — ED Triage Notes (Signed)
Pt says that he has had shortness of breath that woke him up from his sleep that has not stopped since. Reports that he is having pain on the right side, hx of PE, not taking his Eliquis for about a week because he cannot afford it.

## 2020-03-21 NOTE — ED Provider Notes (Signed)
[facility] EMERGENCY DEPARTMENT Provider Note   CSN: [id number] Arrival date & time: 03/21/20  0216     History Chief Complaint  Patient presents with  . Shortness of Breath    Jason [name] is a 55 y.o. male.  Pt presents to the ED today with right sided cp and sob.  He also has left knee pain.  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  He is supposed to have a left AKA with Dr. 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  Pt has a hx of PE and has not been taking his Eliquis.  He said he ran out about 1 week ago.  He was seen in the ED on 12/31 and had a CT chest which was negative for PE.  Pt's main issue seems to be pain to his left knee.  He's been taking oxycodone 30 mg which has not been helping the pain.  He has been seen in the ED several times for the knee pain.  Per Epic review, Dr. Jess Barters office has been calling patient to try to make an appt with him.  He said he spoke with them today.        Past Medical History:  Diagnosis Date  . Allergy   . Anxiety   . Arthritis    left shoulder  . Bronchitis   . Chest pain 07/14/2016  . Complication of anesthesia   . DJD (degenerative joint disease) 11/29/2011  . Hypertension   . Neuromuscular disorder ([place])    carpal tunnel bilateral, ulner nerve surgery  . PONV (postoperative nausea and vomiting)   . Septic arthritis of knee, right ([place]) 11/29/2011  . Spinal headache    "long time ago"    Patient Active Problem List   Diagnosis Date Noted  . Osteomyelitis of left knee region ([place])   . Bilateral pulmonary embolism ([place]) 01/15/2020  . Encounter for central line placement   . Septic arthritis ([place]) 01/11/2020  . Syncope 01/11/2020  . Elevated troponin 01/11/2020  . Chest pain 01/11/2020  . Bright red blood per rectum 01/11/2020  . Anemia 01/11/2020  . Pain management contract broken 09/13/2018  . Benzodiazepine dependence, continuous ([place])  12/25/2017  . Amputation stump pain ([place]) 11/18/2017  . Opiate dependence ([place]) 05/06/2017  . Primary insomnia 05/06/2017  . Functional diarrhea 05/06/2017  . Phantom limb pain ([place]) 05/06/2017  . Muscle atrophy of lower extremity 05/06/2017  . Other chronic pain 05/06/2017  . Essential hypertension 07/14/2016  . S/P AKA (above knee amputation) unilateral, right ([place]) 07/14/2016  . Shoulder impingement syndrome, left 07/29/2011    Past Surgical History:  Procedure Laterality Date  . 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 APPENDECTOMY    . CHOLECYSTECTOMY    . I & D EXTREMITY Left 01/12/2020   Procedure: IRRIGATION AND DEBRIDEMENT EXTREMITY;  Surgeon: Shona Needles, MD;  Location: [facility];  Service: Orthopedics;  Laterality: Left;  . I & D EXTREMITY Left 01/18/2020   Procedure: IRRIGATION AND DEBRIDEMENT EXTREMITY;  Surgeon: Newt Minion, MD;  Location: [facility];  Service: Orthopedics;  Laterality: Left;  . I & D EXTREMITY Left 01/27/2020   Procedure: EXCISIONAL DEBRIDEMENT LEFT KNEE WITH ANTIBIOTIC BEADS;  Surgeon: Newt Minion, MD;  Location: [facility];  Service: Orthopedics;  Laterality: Left;  . IRRIGATION AND DEBRIDEMENT KNEE  11/26/2011   Procedure: IRRIGATION AND DEBRIDEMENT KNEE;  Surgeon: Kerin Salen, MD;  Location: [facility];  Service: Orthopedics;  Laterality: Right;  . KNEE ARTHROSCOPY  10/24/2011   Procedure: ARTHROSCOPY KNEE;  Surgeon: Kerin Salen, MD;  Location: [facility];  Service: Orthopedics;  Laterality: Right;  . KNEE SURGERY     7 knee surgeries on right, and 4 knee surgeries left  . LEFT HEART CATH AND CORONARY ANGIOGRAPHY N/A 07/14/2016   Procedure: Left Heart Cath and Coronary Angiography;  Surgeon: Belva Crome, MD;  Location: [facility];  Service: Cardiovascular;  Laterality: N/A;  . SHOULDER ARTHROSCOPY  07/29/2011   Procedure: ARTHROSCOPY SHOULDER;  Surgeon: Mcarthur Rossetti, MD;  Location: [facility];  Service: Orthopedics;  Laterality: Left;  Left  shoulder arthroscopy with minimal debridement, left wrist steroid injection  . SHOULDER SURGERY     3 surgeries on right, 2 surgeries on left  . ULNAR NERVE REPAIR         Family History  Problem Relation Age of Onset  . Heart attack Father   . COPD Father   . Diabetes Father   . Hypertension Father   . Heart attack Sister   . Arrhythmia Sister        Long QT syndrome, PPM  . Hypertension Mother   . Drug abuse Brother     Social History   Tobacco Use  . Smoking status: Never Smoker  . Smokeless tobacco: Never Used  Vaping Use  . Vaping Use: Never used  Substance Use Topics  . Alcohol use: No  . Drug use: No    Home Medications Prior to Admission medications   Medication Sig Start Date End Date Taking? Authorizing Provider  apixaban (ELIQUIS) 5 MG TABS tablet Take 1 tablet (5 mg total) by mouth 2 (two) times daily. 03/21/20   Isla Pence, MD  clonazePAM (KLONOPIN) 1 MG tablet Take 1 mg by mouth 3 (three) times daily.    [provider]  docusate sodium (COLACE) 100 MG capsule Take 1 capsule (100 mg total) by mouth 2 (two) times daily. Patient not taking: No sig reported 02/01/20   Aline August, MD  EPINEPHrine 0.3 mg/0.3 mL IJ SOAJ injection Inject 0.3 mg into the muscle once as needed for anaphylaxis. 05/28/16   [provider]  gabapentin (NEURONTIN) 300 MG capsule Take 1 capsule (300 mg total) by mouth 3 (three) times daily. 02/01/20   Aline August, MD  Lactobacillus (ACIDOPHILUS) TABS Take 1 tablet by mouth daily. 02/07/20   [provider]  methocarbamol (ROBAXIN) 500 MG tablet Take 2 tablets (1,000 mg total) by mouth every 8 (eight) hours. Patient not taking: Reported on 03/13/2020 02/01/20   Aline August, MD  metoprolol tartrate (LOPRESSOR) 25 MG tablet Take 0.5 tablets (12.5 mg total) by mouth 2 (two) times daily. 02/01/20   Aline August, MD  naloxone [facility]) 4 MG/0.1ML LIQD nasal spray kit Place 1 spray into the nose as needed  (overdose).    [provider]  naproxen sodium (ALEVE) 220 MG tablet Take 440 mg by mouth daily as needed (pain).    [provider]  oxycodone (ROXICODONE) 30 MG immediate release tablet Take 30 mg by mouth every 6 (six) hours as needed for pain.    [provider]  polyethylene glycol (MIRALAX / GLYCOLAX) 17 g packet Take 17 g by mouth daily as needed for mild constipation. Patient not taking: No sig reported 02/01/20   Aline August, MD  zolpidem (AMBIEN CR) 12.5 MG CR  tablet Take 12.5 mg by mouth at bedtime.    [provider]    Allergies    Ivp dye [iodinated diagnostic agents], Metrizamide, Other, Reglan [metoclopramide], Shellfish-derived products, Codeine, Lidocaine, Methadone, Morphine, Propoxyphene, Toradol [ketorolac tromethamine], Iodine, Tape, and Vancomycin  Review of Systems   Review of Systems  Cardiovascular: Positive for chest pain.  Musculoskeletal:       Left knee pain  All other systems reviewed and are negative.   Physical Exam Updated Vital Signs BP (!) 160/124 (BP Location: Left Arm)   Pulse 87   Temp 98.3 F (36.8 C) (Oral)   Resp 18   SpO2 99%   Physical Exam Vitals and nursing note reviewed.  Constitutional:      Appearance: He is well-developed.  HENT:     Head: Normocephalic and atraumatic.     Mouth/Throat:     Mouth: Mucous membranes are moist.     Pharynx: Oropharynx is clear.  Eyes:     Extraocular Movements: Extraocular movements intact.     Pupils: Pupils are equal, round, and reactive to light.  Cardiovascular:     Rate and Rhythm: Normal rate and regular rhythm.  Pulmonary:     Effort: Pulmonary effort is normal.     Breath sounds: Normal breath sounds.  Abdominal:     General: Bowel sounds are normal.     Palpations: Abdomen is soft.  Musculoskeletal:     Cervical back: Normal range of motion and neck supple.     Comments: Right aka Left knee with chronic deformity and swelling  Skin:     General: Skin is warm.     Capillary Refill: Capillary refill takes less than 2 seconds.  Neurological:     General: No focal deficit present.     Mental Status: He is alert and oriented to person, place, and time.  Psychiatric:        Mood and Affect: Mood normal.        Behavior: Behavior normal.     ED Results / Procedures / Treatments   Labs (all labs ordered are listed, but only abnormal results are displayed) Labs Reviewed  BASIC METABOLIC PANEL - Abnormal; Notable for the following components:      Result Value   Calcium 8.7 (*)    All other components within normal limits  CBC - Abnormal; Notable for the following components:   Hemoglobin 11.4 (*)    HCT 37.1 (*)    RDW 16.7 (*)    All other components within normal limits  RESP PANEL BY RT-PCR (FLU A&B, COVID) ARPGX2    EKG None  Radiology DG Chest 2 View  Result Date: 03/21/2020 CLINICAL DATA:  Dyspnea EXAM: CHEST - 2 VIEW COMPARISON:  03/15/2020 FINDINGS: Lungs are well expanded, symmetric, and clear. No pneumothorax or pleural effusion. Cardiac size within normal limits. Pulmonary vascularity is normal. Osseous structures are age-appropriate. No acute bone abnormality. IMPRESSION: No active cardiopulmonary disease. Electronically Signed   By: Fidela Salisbury MD   On: 03/21/2020 03:22    Procedures Procedures (including critical care time)  Medications Ordered in ED Medications  albuterol (VENTOLIN HFA) 108 (90 Base) MCG/ACT inhaler 2 puff (has no administration in time range)  HYDROmorphone (DILAUDID) injection 2 mg (2 mg Intramuscular Given 03/21/20 0939)    ED Course  I have reviewed the triage vital signs and the nursing notes.  Pertinent labs & imaging results that were available during my care of the patient were reviewed  by me and considered in my medical decision making (see chart for details).    MDM Rules/Calculators/A&P                          CP seems pleuritic.  No swelling to the left leg.   Doubt DVT.  He just had a CTA to r/o PE.  He is not hypoxic or tachycardic today.  I don't think I need to repeat that.  I am going to put him back on his Eliquis which is what I'd do even if he had a PE. He will be given a dose of dilaudid in the ED for his knee pain and instructed to f/u with Dr. Sharol Given.   Final Clinical Impression(s) / ED Diagnoses Final diagnoses:  Chronic pain of left knee  Noncompliance with medications    Rx / DC Orders ED Discharge Orders         Ordered    apixaban (ELIQUIS) 5 MG TABS tablet  2 times daily        03/21/20 0941           Isla Pence, MD 03/21/20 [phone]

## 2020-03-22 ENCOUNTER — Ambulatory Visit: Payer: Medicare Other | Admitting: Orthopedic Surgery

## 2020-03-27 ENCOUNTER — Telehealth: Payer: Self-pay | Admitting: Orthopedic Surgery

## 2020-03-27 NOTE — Telephone Encounter (Signed)
[name] with [facility] called stating she would like a CB from Jason [name] to discuss orders.  [name] CB# [phone]

## 2020-03-28 NOTE — Telephone Encounter (Signed)
I called and advised that we would be happy to give orders for the pt but he has not been seen in the office except for once since his surgery. The pt makes appts and then cancels the appt and goes to the ER on the same day. Appt sch with Dr. Sharol Given tomorrow at 1:45. HHN also asked if we would help get the pt DME rolling walker and a bedside commode. I advised that I would put an order in parachute for this. Advised they will call to discuss any out of pocket expense there may be and they will not deliver unless they speak with the pt direct. HHn advised that she will relay this information to the pt. They will not be able to continue to see the pt and will have to D/C him if he does not come into the office and get updated orders. She will call me today if the pt is not able to make the appt tomorrow.

## 2020-03-29 ENCOUNTER — Ambulatory Visit: Payer: Medicare Other | Admitting: Orthopedic Surgery

## 2020-03-29 NOTE — Telephone Encounter (Signed)
I called and lm on vm to advise that pt that we have once had him ion the office once since his surgery in November and he needs follow up. He did not keep his appt for today and want to see if we can schedule him for next week. Advised that I had spoken to the [facility] this week and that they will have to d/c the pt from service if they can not get doctors orders but we can not give orders if we do not see the pt. He needs to call the office and make an appt for next for evaluation.

## 2020-04-03 ENCOUNTER — Telehealth: Payer: Self-pay | Admitting: Orthopedic Surgery

## 2020-04-03 NOTE — Telephone Encounter (Signed)
I called and sw HHN she advised that they have called the pt every day since last Wednesday and he will not answer the phone or return calls. Advised that they will have to d/c him from service if he does not come in the office for updated orders or answer the phone to set up home visits. I advised that I will call the pt and make an appt. He states that he is not able to come in the office at all next week and scheduled for 04/16/20 at 9:45 I advised that [facility] will not continue service if he does not show for this appt. Orders will be signed with a [place] date 03/02/20 through 04/16/20. If he doe snot show for the appt HHN will d/c services. Pt voiced understanding and will call with any questions. HHN is aware of plan.

## 2020-04-03 NOTE — Telephone Encounter (Signed)
Received call from  [name] -nurse with [facility] asked for a call back to get patient worked into Dr Jess Barters schedule. The number to contact [name] is [phone]

## 2020-04-16 ENCOUNTER — Ambulatory Visit: Payer: Medicare Other | Admitting: Orthopedic Surgery

## 2020-04-19 ENCOUNTER — Ambulatory Visit: Payer: Medicare Other | Admitting: Orthopedic Surgery

## 2020-07-16 ENCOUNTER — Ambulatory Visit (INDEPENDENT_AMBULATORY_CARE_PROVIDER_SITE_OTHER): Payer: Medicare Other | Admitting: Orthopedic Surgery

## 2020-07-16 ENCOUNTER — Ambulatory Visit (INDEPENDENT_AMBULATORY_CARE_PROVIDER_SITE_OTHER): Payer: Medicare Other

## 2020-07-16 ENCOUNTER — Encounter: Payer: Self-pay | Admitting: Orthopedic Surgery

## 2020-07-16 DIAGNOSIS — M24662 Ankylosis, left knee: Secondary | ICD-10-CM

## 2020-07-16 DIAGNOSIS — M25562 Pain in left knee: Secondary | ICD-10-CM | POA: Diagnosis not present

## 2020-07-16 DIAGNOSIS — M869 Osteomyelitis, unspecified: Secondary | ICD-10-CM

## 2020-07-16 NOTE — Progress Notes (Signed)
Office Visit Note   Patient: Jason [name]           Date of Birth: 24-Aug-1965           MRN: [id number] Visit Date: 07/16/2020              Requested by: No referring provider defined for this encounter. PCP: Patient, No Pcp Per (Inactive)  Chief Complaint  Patient presents with  . Left Knee - Pain      HPI: Patient is a 55 year old gentleman who is seen for evaluation for his left knee.  Patient is status post multitrauma with a right above-the-knee amputation he has had multitrauma to the left knee multiple open and arthroscopic surgical interventions for a septic joint.  Patient's joint has been stable he has been going to physical therapy and has developed extensive arthrofibrosis with no further improvement with therapy.  Assessment & Plan: Visit Diagnoses:  1. Osteomyelitis of left knee region ([place])   2. Acute pain of left knee   3. Arthrofibrosis of knee joint, left     Plan: Patient's knee is stable no clinical signs or symptoms of infection.  We will plan for arthroscopic debridement with manipulation under anesthesia as outpatient.    Follow-Up Instructions: Return in about 2 weeks (around 07/30/2020).   Ortho Exam  Patient is alert, oriented, no adenopathy, well-dressed, normal affect, normal respiratory effort. Examination patient has arthrofibrosis of the left knee he has range of motion from 30 to 50 degrees.  There is multiple scars around the knee but there is no redness no swelling no effusion no tenderness to palpation no clinical signs of acute infection.  Imaging: No results found. No images are attached to the encounter.  Labs: Lab Results  Component Value Date   ESRSEDRATE 7 03/13/2020   ESRSEDRATE 80 (H) 01/24/2020   ESRSEDRATE 36 (H) 11/29/2011   CRP 0.6 03/13/2020   CRP 40.0 (H) 01/24/2020   CRP 19.5 (H) 11/29/2011   REPTSTATUS 03/13/2020 FINAL 03/08/2020   GRAMSTAIN  01/18/2020    RARE WBC PRESENT,BOTH PMN AND MONONUCLEAR NO ORGANISMS  SEEN    CULT  03/08/2020    NO GROWTH 5 DAYS Performed at [facility], [place] [address] [place], [place] [address]      Lab Results  Component Value Date   ALBUMIN 3.6 03/08/2020   ALBUMIN 2.1 (L) 01/27/2020   ALBUMIN 2.2 (L) 01/26/2020    Lab Results  Component Value Date   MG 1.8 01/25/2020   MG 1.8 01/19/2020   MG 1.9 01/12/2020   No results found for: VD25OH  No results found for: PREALBUMIN CBC EXTENDED Latest Ref Rng & Units 03/21/2020 03/15/2020 03/12/2020  WBC 4.0 - 10.5 K/uL 6.7 7.4 6.0  RBC 4.22 - 5.81 MIL/uL 4.39 4.45 4.69  HGB 13.0 - 17.0 g/dL 11.4(L) 11.5(L) 12.3(L)  HCT 39.0 - 52.0 % 37.1(L) 37.5(L) 40.4  PLT 150 - 400 K/uL 339 274 236  NEUTROABS 1.7 - 7.7 K/uL - - -  LYMPHSABS 0.7 - 4.0 K/uL - - -     There is no height or weight on file to calculate BMI.  Orders:  Orders Placed This Encounter  Procedures  . XR Knee 1-2 Views Left   No orders of the defined types were placed in this encounter.    Procedures: No procedures performed  Clinical Data: No additional findings.  ROS:  All other systems negative, except as noted in the HPI. Review of  Systems  Objective: Vital Signs: There were no vitals taken for this visit.  Specialty Comments:  No specialty comments available.  PMFS History: Patient Active Problem List   Diagnosis Date Noted  . Osteomyelitis of left knee region ([place])   . Bilateral pulmonary embolism ([place]) 01/15/2020  . Encounter for central line placement   . Septic arthritis ([place]) 01/11/2020  . Syncope 01/11/2020  . Elevated troponin 01/11/2020  . Chest pain 01/11/2020  . Bright red blood per rectum 01/11/2020  . Anemia 01/11/2020  . Pain management contract broken 09/13/2018  . Benzodiazepine dependence, continuous ([place]) 12/25/2017  . Amputation stump pain ([place]) 11/18/2017  . Opiate dependence ([place]) 05/06/2017  . Primary insomnia 05/06/2017  . Functional diarrhea 05/06/2017  . Phantom limb pain ([place])  05/06/2017  . Muscle atrophy of lower extremity 05/06/2017  . Other chronic pain 05/06/2017  . Essential hypertension 07/14/2016  . S/P AKA (above knee amputation) unilateral, right ([place]) 07/14/2016  . Shoulder impingement syndrome, left 07/29/2011   Past Medical History:  Diagnosis Date  . Allergy   . Anxiety   . Arthritis    left shoulder  . Bronchitis   . Chest pain 07/14/2016  . Complication of anesthesia   . DJD (degenerative joint disease) 11/29/2011  . Hypertension   . Neuromuscular disorder ([place])    carpal tunnel bilateral, ulner nerve surgery  . PONV (postoperative nausea and vomiting)   . Septic arthritis of knee, right ([place]) 11/29/2011  . Spinal headache    "long time ago"    Family History  Problem Relation Age of Onset  . Heart attack Father   . COPD Father   . Diabetes Father   . Hypertension Father   . Heart attack Sister   . Arrhythmia Sister        Long QT syndrome, PPM  . Hypertension Mother   . Drug abuse Brother     Past Surgical History:  Procedure Laterality Date  . 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 APPENDECTOMY    . CHOLECYSTECTOMY    . I & D EXTREMITY Left 01/12/2020   Procedure: IRRIGATION AND DEBRIDEMENT EXTREMITY;  Surgeon: Shona Needles, MD;  Location: [facility];  Service: Orthopedics;  Laterality: Left;  . I & D EXTREMITY Left 01/18/2020   Procedure: IRRIGATION AND DEBRIDEMENT EXTREMITY;  Surgeon: Newt Minion, MD;  Location: [facility];  Service: Orthopedics;  Laterality: Left;  . I & D EXTREMITY Left 01/27/2020   Procedure: EXCISIONAL DEBRIDEMENT LEFT KNEE WITH ANTIBIOTIC BEADS;  Surgeon: Newt Minion, MD;  Location: [facility];  Service: Orthopedics;  Laterality: Left;  . IRRIGATION AND DEBRIDEMENT KNEE  11/26/2011   Procedure: IRRIGATION AND DEBRIDEMENT KNEE;  Surgeon: Kerin Salen, MD;  Location: [facility];  Service: Orthopedics;  Laterality: Right;  . KNEE ARTHROSCOPY  10/24/2011   Procedure: ARTHROSCOPY KNEE;  Surgeon: Kerin Salen,  MD;  Location: [facility];  Service: Orthopedics;  Laterality: Right;  . KNEE SURGERY     7 knee surgeries on right, and 4 knee surgeries left  . LEFT HEART CATH AND CORONARY ANGIOGRAPHY N/A 07/14/2016   Procedure: Left Heart Cath and Coronary Angiography;  Surgeon: Belva Crome, MD;  Location: [facility];  Service: Cardiovascular;  Laterality: N/A;  . SHOULDER ARTHROSCOPY  07/29/2011   Procedure: ARTHROSCOPY SHOULDER;  Surgeon: Mcarthur Rossetti, MD;  Location: [facility];  Service: Orthopedics;  Laterality: Left;  Left shoulder arthroscopy with minimal debridement, left wrist  steroid injection  . SHOULDER SURGERY     3 surgeries on right, 2 surgeries on left  . ULNAR NERVE REPAIR     Social History   Occupational History  . Not on file  Tobacco Use  . Smoking status: Never Smoker  . Smokeless tobacco: Never Used  Vaping Use  . Vaping Use: Never used  Substance and Sexual Activity  . Alcohol use: No  . Drug use: No  . Sexual activity: Yes

## 2020-07-23 ENCOUNTER — Other Ambulatory Visit: Payer: Self-pay

## 2020-07-23 ENCOUNTER — Other Ambulatory Visit: Payer: Self-pay | Admitting: Physician Assistant

## 2020-07-25 ENCOUNTER — Other Ambulatory Visit (HOSPITAL_COMMUNITY)
Admission: RE | Admit: 2020-07-25 | Discharge: 2020-07-25 | Disposition: A | Discharge status: Home / Self Care | Payer: Medicare Other | Source: Ambulatory Visit | Attending: Orthopedic Surgery | Admitting: Orthopedic Surgery

## 2020-07-25 DIAGNOSIS — Z20822 Contact with and (suspected) exposure to covid-19: Secondary | ICD-10-CM | POA: Diagnosis not present

## 2020-07-25 DIAGNOSIS — Z01812 Encounter for preprocedural laboratory examination: Secondary | ICD-10-CM | POA: Insufficient documentation

## 2020-07-25 LAB — SARS CORONAVIRUS 2 (TAT 6-24 HRS): SARS Coronavirus 2: NEGATIVE

## 2020-07-26 ENCOUNTER — Encounter (HOSPITAL_COMMUNITY): Payer: Self-pay | Admitting: Orthopedic Surgery

## 2020-07-26 ENCOUNTER — Other Ambulatory Visit (HOSPITAL_COMMUNITY): Payer: Medicare Other

## 2020-07-26 NOTE — Progress Notes (Signed)
EKG: 03/21/20 CXR: 03/21/20 ECHO: 01/15/20 Stress Test: deinies Cardiac Cath: 07/14/16 Cardiac studies revealed chest pain not cardiac related  Fasting Blood Sugar- na Checks Blood Sugar__na_ times a day  Asa/Blood Thinners: No  OSA/CPAP: No  Covid test 07/25/20 negative  Anesthesia Review: No   Patient denies shortness of breath, fever, cough, and chest pain at PAT appointment.  Patient verbalized understanding of instructions provided today at the PAT appointment.  Patient asked to review instructions at home and day of surgery.

## 2020-07-27 ENCOUNTER — Ambulatory Visit (HOSPITAL_COMMUNITY): Payer: Medicare Other | Admitting: Anesthesiology

## 2020-07-27 ENCOUNTER — Other Ambulatory Visit: Payer: Self-pay

## 2020-07-27 ENCOUNTER — Other Ambulatory Visit: Payer: Self-pay | Admitting: Physician Assistant

## 2020-07-27 ENCOUNTER — Observation Stay (HOSPITAL_COMMUNITY): Payer: Medicare Other

## 2020-07-27 ENCOUNTER — Encounter (HOSPITAL_COMMUNITY)
Admission: RE | Disposition: A | Discharge status: Home / Self Care | Payer: Self-pay | Source: Home / Self Care | Attending: Orthopedic Surgery

## 2020-07-27 ENCOUNTER — Observation Stay (HOSPITAL_COMMUNITY)
Admission: RE | Admit: 2020-07-27 | Discharge: 2020-07-28 | Disposition: A | Discharge status: Home / Self Care | Payer: Medicare Other | Attending: Orthopedic Surgery | Admitting: Orthopedic Surgery

## 2020-07-27 ENCOUNTER — Encounter (HOSPITAL_COMMUNITY): Payer: Self-pay | Admitting: Orthopedic Surgery

## 2020-07-27 DIAGNOSIS — I10 Essential (primary) hypertension: Secondary | ICD-10-CM | POA: Insufficient documentation

## 2020-07-27 DIAGNOSIS — M24662 Ankylosis, left knee: Secondary | ICD-10-CM | POA: Diagnosis present

## 2020-07-27 DIAGNOSIS — Z79899 Other long term (current) drug therapy: Secondary | ICD-10-CM | POA: Diagnosis not present

## 2020-07-27 DIAGNOSIS — R109 Unspecified abdominal pain: Secondary | ICD-10-CM

## 2020-07-27 DIAGNOSIS — M86162 Other acute osteomyelitis, left tibia and fibula: Secondary | ICD-10-CM | POA: Insufficient documentation

## 2020-07-27 DIAGNOSIS — I700 Atherosclerosis of aorta: Secondary | ICD-10-CM | POA: Diagnosis not present

## 2020-07-27 DIAGNOSIS — K4090 Unilateral inguinal hernia, without obstruction or gangrene, not specified as recurrent: Secondary | ICD-10-CM | POA: Insufficient documentation

## 2020-07-27 DIAGNOSIS — M24669 Ankylosis, unspecified knee: Secondary | ICD-10-CM | POA: Diagnosis present

## 2020-07-27 HISTORY — PX: KNEE ARTHROSCOPY: SHX127

## 2020-07-27 LAB — BASIC METABOLIC PANEL
Anion gap: 10 (ref 5–15)
BUN: 21 mg/dL — ABNORMAL HIGH (ref 6–20)
CO2: 23 mmol/L (ref 22–32)
Calcium: 9 mg/dL (ref 8.9–10.3)
Chloride: 106 mmol/L (ref 98–111)
Creatinine, Ser: 0.92 mg/dL (ref 0.61–1.24)
GFR, Estimated: >60 mL/min (ref >60)
Glucose, Bld: 104 mg/dL — ABNORMAL HIGH (ref 70–99)
Potassium: 3.5 mmol/L (ref 3.5–5.1)
Sodium: 139 mmol/L (ref 135–145)

## 2020-07-27 LAB — CBC WITH DIFFERENTIAL/PLATELET
Abs Immature Granulocytes: 0.05 10*3/uL (ref 0.00–0.07)
Basophils Absolute: 0 10*3/uL (ref 0.0–0.1)
Basophils Relative: 0 %
Eosinophils Absolute: 0 10*3/uL (ref 0.0–0.5)
Eosinophils Relative: 0 %
HCT: 38.7 % — ABNORMAL LOW (ref 39.0–52.0)
Hemoglobin: 13 g/dL (ref 13.0–17.0)
Immature Granulocytes: 1 %
Lymphocytes Relative: 10 %
Lymphs Abs: 1 10*3/uL (ref 0.7–4.0)
MCH: 29.1 pg (ref 26.0–34.0)
MCHC: 33.6 g/dL (ref 30.0–36.0)
MCV: 86.6 fL (ref 80.0–100.0)
Monocytes Absolute: 0.8 10*3/uL (ref 0.1–1.0)
Monocytes Relative: 8 %
Neutro Abs: 8.2 10*3/uL — ABNORMAL HIGH (ref 1.7–7.7)
Neutrophils Relative %: 81 %
Platelets: 249 10*3/uL (ref 150–400)
RBC: 4.47 MIL/uL (ref 4.22–5.81)
RDW: 16.9 % — ABNORMAL HIGH (ref 11.5–15.5)
WBC: 10.1 10*3/uL (ref 4.0–10.5)
nRBC: 0 % (ref 0.0–0.2)

## 2020-07-27 LAB — BLOOD GAS, ARTERIAL
Acid-base deficit: 0.2 mmol/L (ref 0.0–2.0)
Bicarbonate: 24.2 mmol/L (ref 20.0–28.0)
Drawn by: 55062
FIO2: 21
O2 Saturation: 94.1 %
Patient temperature: 36.9
pCO2 arterial: 41.3 mmHg (ref 32.0–48.0)
pH, Arterial: 7.385 (ref 7.350–7.450)
pO2, Arterial: 69.2 mmHg — ABNORMAL LOW (ref 83.0–108.0)

## 2020-07-27 LAB — D-DIMER, QUANTITATIVE: D-Dimer, Quant: 0.78 ug/mL-FEU — ABNORMAL HIGH (ref 0.00–0.50)

## 2020-07-27 SURGERY — ARTHROSCOPY, KNEE
Anesthesia: General | Site: Knee | Laterality: Left

## 2020-07-27 MED ORDER — FENTANYL CITRATE (PF) 100 MCG/2ML IJ SOLN
INTRAMUSCULAR | Status: AC
  Filled 2020-07-27: qty 2

## 2020-07-27 MED ORDER — FENTANYL CITRATE (PF) 100 MCG/2ML IJ SOLN
INTRAMUSCULAR | Status: DC | PRN
  Administered 2020-07-27 (×3): 50 ug via INTRAVENOUS

## 2020-07-27 MED ORDER — KETOROLAC TROMETHAMINE 15 MG/ML IJ SOLN
INTRAMUSCULAR | Status: AC
  Filled 2020-07-27: qty 1

## 2020-07-27 MED ORDER — CEFAZOLIN SODIUM-DEXTROSE 2-4 GM/100ML-% IV SOLN
2.0000 g | INTRAVENOUS | Status: AC
  Administered 2020-07-27: 2 g via INTRAVENOUS
  Filled 2020-07-27: qty 100

## 2020-07-27 MED ORDER — OXYCODONE-ACETAMINOPHEN 10-325 MG PO TABS: 1.0000 | ORAL_TABLET | Freq: Two times a day (BID) | ORAL | 0 refills | Status: DC | PRN

## 2020-07-27 MED ORDER — ONDANSETRON HCL 4 MG/2ML IJ SOLN
INTRAMUSCULAR | Status: AC
  Filled 2020-07-27: qty 2

## 2020-07-27 MED ORDER — GABAPENTIN 300 MG PO CAPS
600.0000 mg | ORAL_CAPSULE | Freq: Three times a day (TID) | ORAL | Status: DC
  Administered 2020-07-27 – 2020-07-28 (×3): 600 mg via ORAL
  Filled 2020-07-27 (×3): qty 2

## 2020-07-27 MED ORDER — DIPHENHYDRAMINE HCL 50 MG/ML IJ SOLN
INTRAMUSCULAR | Status: DC | PRN
  Administered 2020-07-27: 12.5 mg via INTRAVENOUS

## 2020-07-27 MED ORDER — HYDROMORPHONE HCL 1 MG/ML IJ SOLN
INTRAMUSCULAR | Status: AC
  Filled 2020-07-27: qty 1

## 2020-07-27 MED ORDER — LIDOCAINE 2% (20 MG/ML) 5 ML SYRINGE
INTRAMUSCULAR | Status: DC | PRN
  Administered 2020-07-27: 40 mg via INTRAVENOUS

## 2020-07-27 MED ORDER — DIPHENHYDRAMINE HCL 50 MG/ML IJ SOLN
INTRAMUSCULAR | Status: AC
  Filled 2020-07-27: qty 1

## 2020-07-27 MED ORDER — OXYCODONE HCL 5 MG PO TABS: 5.0000 mg | ORAL_TABLET | Freq: Two times a day (BID) | ORAL | Status: DC | PRN

## 2020-07-27 MED ORDER — KETOROLAC TROMETHAMINE 30 MG/ML IJ SOLN
INTRAMUSCULAR | Status: AC
  Filled 2020-07-27: qty 1

## 2020-07-27 MED ORDER — ONDANSETRON HCL 4 MG/2ML IJ SOLN
INTRAMUSCULAR | Status: AC
  Filled 2020-07-27: qty 4

## 2020-07-27 MED ORDER — LACTATED RINGERS IV SOLN: INTRAVENOUS | Status: DC | PRN

## 2020-07-27 MED ORDER — MIDAZOLAM HCL 5 MG/5ML IJ SOLN
INTRAMUSCULAR | Status: DC | PRN
  Administered 2020-07-27: 2 mg via INTRAVENOUS

## 2020-07-27 MED ORDER — CLONAZEPAM 0.5 MG PO TABS
1.0000 mg | ORAL_TABLET | Freq: Two times a day (BID) | ORAL | Status: DC | PRN
  Administered 2020-07-27 – 2020-07-28 (×2): 1 mg via ORAL
  Filled 2020-07-27 (×2): qty 2

## 2020-07-27 MED ORDER — PROPOFOL 10 MG/ML IV BOLUS
INTRAVENOUS | Status: DC | PRN
  Administered 2020-07-27: 200 mg via INTRAVENOUS

## 2020-07-27 MED ORDER — ONDANSETRON HCL 4 MG/2ML IJ SOLN
4.0000 mg | Freq: Four times a day (QID) | INTRAMUSCULAR | Status: DC | PRN
  Administered 2020-07-27: 4 mg via INTRAVENOUS
  Filled 2020-07-27: qty 2

## 2020-07-27 MED ORDER — ACETAMINOPHEN 10 MG/ML IV SOLN
INTRAVENOUS | Status: AC
  Filled 2020-07-27: qty 100

## 2020-07-27 MED ORDER — ONDANSETRON HCL 4 MG PO TABS: 4.0000 mg | ORAL_TABLET | Freq: Four times a day (QID) | ORAL | Status: DC | PRN

## 2020-07-27 MED ORDER — APIXABAN 5 MG PO TABS
5.0000 mg | ORAL_TABLET | Freq: Two times a day (BID) | ORAL | Status: DC
  Administered 2020-07-27: 5 mg via ORAL
  Filled 2020-07-27: qty 1

## 2020-07-27 MED ORDER — HYDROMORPHONE HCL 1 MG/ML IJ SOLN
1.0000 mg | Freq: Once | INTRAMUSCULAR | Status: AC
Start: 2020-07-27 — End: 2020-07-27
  Administered 2020-07-27: 1 mg via INTRAVENOUS

## 2020-07-27 MED ORDER — SODIUM CHLORIDE 0.9 % IR SOLN
Status: DC | PRN
  Administered 2020-07-27: 12000 mL

## 2020-07-27 MED ORDER — HYDROMORPHONE HCL 1 MG/ML IJ SOLN
0.5000 mg | INTRAMUSCULAR | Status: DC | PRN
  Administered 2020-07-27 – 2020-07-28 (×6): 0.5 mg via INTRAVENOUS
  Filled 2020-07-27 (×6): qty 0.5

## 2020-07-27 MED ORDER — EPHEDRINE SULFATE-NACL 50-0.9 MG/10ML-% IV SOSY
PREFILLED_SYRINGE | INTRAVENOUS | Status: DC | PRN
  Administered 2020-07-27 (×5): 5 mg via INTRAVENOUS

## 2020-07-27 MED ORDER — SODIUM CHLORIDE 0.9 % IV SOLN: INTRAVENOUS | Status: DC

## 2020-07-27 MED ORDER — LACTATED RINGERS IV SOLN: INTRAVENOUS | Status: DC

## 2020-07-27 MED ORDER — FENTANYL CITRATE (PF) 100 MCG/2ML IJ SOLN
25.0000 ug | INTRAMUSCULAR | Status: DC | PRN
  Administered 2020-07-27 (×3): 50 ug via INTRAVENOUS

## 2020-07-27 MED ORDER — DEXMEDETOMIDINE (PRECEDEX) IN NS 20 MCG/5ML (4 MCG/ML) IV SYRINGE
PREFILLED_SYRINGE | INTRAVENOUS | Status: DC | PRN
  Administered 2020-07-27 (×5): 4 ug via INTRAVENOUS

## 2020-07-27 MED ORDER — OXYCODONE HCL 5 MG PO TABS
30.0000 mg | ORAL_TABLET | Freq: Every day | ORAL | Status: DC
  Administered 2020-07-27 – 2020-07-28 (×5): 30 mg via ORAL
  Filled 2020-07-27 (×5): qty 6

## 2020-07-27 MED ORDER — KETOROLAC TROMETHAMINE 15 MG/ML IJ SOLN
15.0000 mg | Freq: Once | INTRAMUSCULAR | Status: AC
  Administered 2020-07-27: 15 mg via INTRAVENOUS

## 2020-07-27 MED ORDER — METOPROLOL SUCCINATE ER 50 MG PO TB24
50.0000 mg | ORAL_TABLET | Freq: Every day | ORAL | Status: DC
  Administered 2020-07-27: 50 mg via ORAL
  Filled 2020-07-27: qty 1

## 2020-07-27 MED ORDER — DEXMEDETOMIDINE (PRECEDEX) IN NS 20 MCG/5ML (4 MCG/ML) IV SYRINGE
PREFILLED_SYRINGE | INTRAVENOUS | Status: AC
  Filled 2020-07-27: qty 5

## 2020-07-27 MED ORDER — BUPIVACAINE HCL (PF) 0.5 % IJ SOLN
INTRAMUSCULAR | Status: AC
  Filled 2020-07-27: qty 30

## 2020-07-27 MED ORDER — OXYCODONE HCL 5 MG PO TABS
ORAL_TABLET | ORAL | Status: AC
  Filled 2020-07-27: qty 1

## 2020-07-27 MED ORDER — ORAL CARE MOUTH RINSE: 15.0000 mL | Freq: Once | OROMUCOSAL | Status: AC

## 2020-07-27 MED ORDER — ASPIRIN EC 81 MG PO TBEC: 81.0000 mg | DELAYED_RELEASE_TABLET | Freq: Every day | ORAL | 0 refills | Status: DC

## 2020-07-27 MED ORDER — BUPIVACAINE HCL (PF) 0.5 % IJ SOLN
INTRAMUSCULAR | Status: DC | PRN
  Administered 2020-07-27: 30 mL

## 2020-07-27 MED ORDER — ACETAMINOPHEN 10 MG/ML IV SOLN
1000.0000 mg | Freq: Four times a day (QID) | INTRAVENOUS | Status: DC
  Administered 2020-07-27: 1000 mg via INTRAVENOUS

## 2020-07-27 MED ORDER — MIDAZOLAM HCL 2 MG/2ML IJ SOLN
INTRAMUSCULAR | Status: AC
  Filled 2020-07-27: qty 2

## 2020-07-27 MED ORDER — HYDROMORPHONE HCL 1 MG/ML IJ SOLN
1.0000 mg | Freq: Once | INTRAMUSCULAR | Status: AC
  Administered 2020-07-27: 1 mg via INTRAVENOUS

## 2020-07-27 MED ORDER — PROPOFOL 10 MG/ML IV BOLUS
INTRAVENOUS | Status: AC
  Filled 2020-07-27: qty 40

## 2020-07-27 MED ORDER — DOCUSATE SODIUM 100 MG PO CAPS
100.0000 mg | ORAL_CAPSULE | Freq: Two times a day (BID) | ORAL | Status: DC
  Administered 2020-07-27 – 2020-07-28 (×3): 100 mg via ORAL
  Filled 2020-07-27 (×3): qty 1

## 2020-07-27 MED ORDER — ONDANSETRON HCL 4 MG/2ML IJ SOLN
INTRAMUSCULAR | Status: DC | PRN
  Administered 2020-07-27 (×2): 4 mg via INTRAVENOUS

## 2020-07-27 MED ORDER — DEXAMETHASONE SODIUM PHOSPHATE 10 MG/ML IJ SOLN
INTRAMUSCULAR | Status: AC
  Filled 2020-07-27: qty 1

## 2020-07-27 MED ORDER — FENTANYL CITRATE (PF) 250 MCG/5ML IJ SOLN
INTRAMUSCULAR | Status: AC
  Filled 2020-07-27: qty 5

## 2020-07-27 MED ORDER — APIXABAN 5 MG PO TABS
5.0000 mg | ORAL_TABLET | Freq: Two times a day (BID) | ORAL | Status: DC
  Administered 2020-07-28: 5 mg via ORAL
  Filled 2020-07-27: qty 1

## 2020-07-27 MED ORDER — ACETAMINOPHEN 325 MG PO TABS
325.0000 mg | ORAL_TABLET | Freq: Four times a day (QID) | ORAL | Status: DC | PRN
  Administered 2020-07-27: 650 mg via ORAL
  Filled 2020-07-27: qty 2

## 2020-07-27 MED ORDER — CHLORHEXIDINE GLUCONATE 0.12 % MT SOLN
15.0000 mL | Freq: Once | OROMUCOSAL | Status: AC
  Administered 2020-07-27: 15 mL via OROMUCOSAL
  Filled 2020-07-27: qty 15

## 2020-07-27 MED ORDER — OXYCODONE HCL 5 MG PO TABS
5.0000 mg | ORAL_TABLET | Freq: Once | ORAL | Status: AC | PRN
  Administered 2020-07-27: 5 mg via ORAL

## 2020-07-27 MED ORDER — ONDANSETRON HCL 4 MG/2ML IJ SOLN: 4.0000 mg | Freq: Four times a day (QID) | INTRAMUSCULAR | Status: DC | PRN

## 2020-07-27 MED ORDER — OXYCODONE HCL 5 MG/5ML PO SOLN: 5.0000 mg | Freq: Once | ORAL | Status: AC | PRN

## 2020-07-27 MED ORDER — DEXAMETHASONE SODIUM PHOSPHATE 10 MG/ML IJ SOLN
INTRAMUSCULAR | Status: DC | PRN
  Administered 2020-07-27: 10 mg via INTRAVENOUS

## 2020-07-27 SURGICAL SUPPLY — 29 items
BLADE EXCALIBUR 4.0X13 (MISCELLANEOUS) ×2 IMPLANT
BNDG COHESIVE 6X5 TAN STRL LF (GAUZE/BANDAGES/DRESSINGS) ×2 IMPLANT
BNDG GAUZE ELAST 4 BULKY (GAUZE/BANDAGES/DRESSINGS) ×2 IMPLANT
COVER SURGICAL LIGHT HANDLE (MISCELLANEOUS) ×2 IMPLANT
CUFF TOURN SGL QUICK 34 (TOURNIQUET CUFF)
CUFF TOURN SGL QUICK 42 (TOURNIQUET CUFF) IMPLANT
CUFF TRNQT CYL 34X4.125X (TOURNIQUET CUFF) IMPLANT
DRAPE ARTHROSCOPY W/POUCH 114 (DRAPES) ×2 IMPLANT
DRAPE U-SHAPE 47X51 STRL (DRAPES) ×2 IMPLANT
DRSG EMULSION OIL 3X3 NADH (GAUZE/BANDAGES/DRESSINGS) ×2 IMPLANT
DURAPREP 26ML APPLICATOR (WOUND CARE) ×2 IMPLANT
GAUZE SPONGE 4X4 12PLY STRL (GAUZE/BANDAGES/DRESSINGS) ×2 IMPLANT
GLOVE BIOGEL PI IND STRL 9 (GLOVE) ×1 IMPLANT
GLOVE BIOGEL PI INDICATOR 9 (GLOVE) ×1
GLOVE SURG ORTHO 9.0 STRL STRW (GLOVE) ×2 IMPLANT
GOWN STRL REUS W/ TWL XL LVL3 (GOWN DISPOSABLE) ×3 IMPLANT
GOWN STRL REUS W/TWL XL LVL3 (GOWN DISPOSABLE) ×3
KIT BASIN OR (CUSTOM PROCEDURE TRAY) ×2 IMPLANT
KIT TURNOVER KIT B (KITS) ×2 IMPLANT
MANIFOLD NEPTUNE II (INSTRUMENTS) ×2 IMPLANT
NEEDLE 18GX1X1/2 (RX/OR ONLY) (NEEDLE) ×2 IMPLANT
PACK ARTHROSCOPY DSU (CUSTOM PROCEDURE TRAY) ×2 IMPLANT
PAD ARMBOARD 7.5X6 YLW CONV (MISCELLANEOUS) ×4 IMPLANT
PORT APPOLLO RF 90DEGREE MULTI (SURGICAL WAND) IMPLANT
PROBE APOLLO 90XL (SURGICAL WAND) ×2 IMPLANT
SUT ETHILON 4 0 PS 2 18 (SUTURE) ×2 IMPLANT
TOWEL GREEN STERILE FF (TOWEL DISPOSABLE) ×4 IMPLANT
TUBE CONNECTING 12X1/4 (SUCTIONS) ×2 IMPLANT
TUBING ARTHROSCOPY IRRIG 16FT (MISCELLANEOUS) ×2 IMPLANT

## 2020-07-27 NOTE — Op Note (Signed)
07/27/2020  8:56 AM  PATIENT:  Jason [name]    PRE-OPERATIVE DIAGNOSIS:  Adhesions Left Knee  POST-OPERATIVE DIAGNOSIS:  Same  PROCEDURE:  LEFT KNEE ARTHROSCOPY, DEBRIDEMENT AND MANIPULATION UNDER ANESTHESIA  SURGEON:  Newt Minion, MD  PHYSICIAN ASSISTANT:None ANESTHESIA:   General  PREOPERATIVE INDICATIONS:  [name] is a  55 y.o. male with a diagnosis of Adhesions Left Knee who failed conservative measures and elected for surgical management.    The risks benefits and alternatives were discussed with the patient preoperatively including but not limited to the risks of infection, bleeding, nerve injury, cardiopulmonary complications, the need for revision surgery, among others, and the patient was willing to proceed.  OPERATIVE IMPLANTS: None  @ENCIMAGES @  OPERATIVE FINDINGS: Extensive delamination of articular cartilage with extensive arthrofibrosis of all 3 compartments.  After debridement and manipulation under anesthesia patient's range of motion went from 45 degrees of fixed flexion contracture to full extension to 90 degrees of flexion.  OPERATIVE PROCEDURE: Patient was brought the operating room and underwent a general anesthetic.  After adequate levels anesthesia were obtained patient's left lower extremity was prepped using DuraPrep draped into a sterile field a timeout was called.  Attempted manipulation under anesthesia was performed prior to arthroscopy debridement.  There was essentially no movement of the knee.  The scope was inserted through the anterior lateral portal and anterior medial portal was established for a working portal.  Visualization showed a dense arthrofibrosis there was no joint visualized.  Using the shaver and the electrical 1 this was debrided down to the articular joint.  There was no articular cartilage within the joint space no meniscus.  Patient underwent extensive debridement of the medial joint line the ACL was intact and patient also  underwent extensive debridement of the lateral joint line also the lateral joint line had no visible joint space prior to debridement.  After debridement there was some articular cartilage on the lateral femoral condyle.  Patient had dense adhesions in the suprapatellar pouch with extensive cartilage trauma to the patella and trochlea.  The shaver and electrical wand were used to further debride the suprapatella pouch.  All loose bodies were removed.  After debridement patient underwent manipulation under anesthesia again and patient had full extension with flexion to 90 degrees.  The instruments were removed the joint was in fused with 30 cc of quarter percent Marcaine plain.  Incisions were closed using 2-0 nylon a sterile dressing was applied patient was extubated taken the PACU in stable condition.   DISCHARGE PLANNING:  Antibiotic duration: Preoperative antibiotics  Weightbearing: Weightbearing as tolerated  Pain medication: Percocet 10 mg tablets  Dressing care/ Wound VAC: Discontinue dressing in 2 days  Ambulatory devices: Walker or crutches  Discharge to: Home.  Follow-up: In the office 1 week post operative.

## 2020-07-27 NOTE — Anesthesia Procedure Notes (Signed)
Procedure Name: LMA Insertion Date/Time: 07/27/2020 7:43 AM Performed by: Gwyndolyn Saxon, CRNA Pre-anesthesia Checklist: Patient identified, Emergency Drugs available, Suction available and Patient being monitored Patient Re-evaluated:Patient Re-evaluated prior to induction Oxygen Delivery Method: Circle system utilized Preoxygenation: Pre-oxygenation with 100% oxygen Induction Type: IV induction Ventilation: Mask ventilation without difficulty and Oral airway inserted - appropriate to patient size LMA: LMA inserted Laryngoscope Size: 5 Number of attempts: 1 Airway Equipment and Method: Patient positioned with wedge pillow Placement Confirmation: positive ETCO2 and breath sounds checked- equal and bilateral Tube secured with: Tape Dental Injury: Teeth and Oropharynx as per pre-operative assessment

## 2020-07-27 NOTE — Transfer of Care (Signed)
Immediate Anesthesia Transfer of Care Note  Patient: Jason [name]  Procedure(s) Performed: LEFT KNEE ARTHROSCOPY, DEBRIDEMENT AND MANIPULATION UNDER ANESTHESIA (Left Knee)  Patient Location: PACU  Anesthesia Type:General  Level of Consciousness: drowsy and patient cooperative  Airway & Oxygen Therapy: Patient Spontanous Breathing and Patient connected to face mask oxygen  Post-op Assessment: Report given to RN and Post -op Vital signs reviewed and stable  Post vital signs: Reviewed and stable  Last Vitals:  Vitals Value Taken Time  BP 138/90 07/27/20 0858  Temp    Pulse 66 07/27/20 0859  Resp 13 07/27/20 0859  SpO2 100 % 07/27/20 0859  Vitals shown include unvalidated device data.  Last Pain:  Vitals:   07/27/20 0615  TempSrc: Oral  PainSc:       Patients Stated Pain Goal: 4 (74/08/14 4818)  Complications: No complications documented.

## 2020-07-27 NOTE — Anesthesia Postprocedure Evaluation (Signed)
Anesthesia Post Note  Patient: Jason [name]  Procedure(s) Performed: LEFT KNEE ARTHROSCOPY, DEBRIDEMENT AND MANIPULATION UNDER ANESTHESIA (Left Knee)     Patient location during evaluation: PACU Anesthesia Type: General Level of consciousness: awake and alert Pain management: pain level controlled Vital Signs Assessment: post-procedure vital signs reviewed and stable Respiratory status: spontaneous breathing, nonlabored ventilation, respiratory function stable and patient connected to nasal cannula oxygen Cardiovascular status: blood pressure returned to baseline and stable Postop Assessment: no apparent nausea or vomiting Anesthetic complications: no   No complications documented.  Last Vitals:  Vitals:   07/27/20 1156 07/27/20 1211  BP:  (!) 134/94  Pulse:  65  Resp:  17  Temp: 36.6 C 36.7 C  SpO2:  95%    Last Pain:  Vitals:   07/27/20 1211  TempSrc: Oral  PainSc:                  [place]

## 2020-07-27 NOTE — Anesthesia Preprocedure Evaluation (Signed)
Anesthesia Evaluation  Patient identified by MRN, date of birth, ID band Patient awake    Reviewed: Allergy & Precautions, H&P , NPO status , Patient's Chart, lab work & pertinent test results  History of Anesthesia Complications (+) PONV, POST - OP SPINAL HEADACHE and history of anesthetic complications  Airway Mallampati: II   Neck ROM: full    Dental   Pulmonary    breath sounds clear to auscultation       Cardiovascular hypertension,  Rhythm:regular Rate:Normal     Neuro/Psych  Headaches, PSYCHIATRIC DISORDERS Anxiety  Neuromuscular disease    GI/Hepatic   Endo/Other    Renal/GU      Musculoskeletal  (+) Arthritis ,   Abdominal   Peds  Hematology   Anesthesia Other Findings   Reproductive/Obstetrics                             Anesthesia Physical Anesthesia Plan  ASA: II  Anesthesia Plan: General   Post-op Pain Management:    Induction: Intravenous  PONV Risk Score and Plan: 3 and Ondansetron, Dexamethasone, Midazolam and Treatment may vary due to age or medical condition  Airway Management Planned: LMA  Additional Equipment:   Intra-op Plan:   Post-operative Plan: Extubation in OR  Informed Consent: I have reviewed the patients History and Physical, chart, labs and discussed the procedure including the risks, benefits and alternatives for the proposed anesthesia with the patient or authorized representative who has indicated his/her understanding and acceptance.     Dental advisory given  Plan Discussed with: CRNA, Anesthesiologist and Surgeon  Anesthesia Plan Comments:         Anesthesia Quick Evaluation

## 2020-07-27 NOTE — Progress Notes (Signed)
Patient ID: Jason [name], male   DOB: [birth year], 55 y.o.   MRN: [id number] Patient complains of right sided abdominal pain in the recovery room.  Patient states he had the same symptoms when he was previously hospitalized.  Patient states that the pain does not radiate.  On examination patient's abdomen is nontender to palpation.  There is no tenderness to palpation in the right upper or right lower quadrant.  The left upper and left lower quadrant are also nontender to palpation the lumbar spine and paraspinous muscles are nontender to palpation.  Patient has no shortness of breath.  Discussed that we could admit the patient and observe him overnight.  Patient states he will discuss this with his son and see if he wants to stay overnight.  I will plan for discharge in the morning.  We will restart his Eliquis medicine that he states he has been off for 1 week.

## 2020-07-27 NOTE — H&P (Signed)
Jason [name] is an 55 y.o. male.   Chief Complaint: Left Knee arthrofibrosis HPI: Patient is a 55 year old gentleman who is seen for evaluation for his left knee.  Patient is status post multitrauma with a right above-the-knee amputation he has had multitrauma to the left knee multiple open and arthroscopic surgical interventions for a septic joint.  Patient's joint has been stable he has been going to physical therapy and has developed extensive arthrofibrosis with no further improvement with therapy.   Past Medical History:  Diagnosis Date  . Allergy   . Anxiety   . Arthritis    left shoulder  . Bronchitis   . Chest pain 07/14/2016  . Complication of anesthesia   . DJD (degenerative joint disease) 11/29/2011  . Hypertension   . Neuromuscular disorder ([place])    carpal tunnel bilateral, ulner nerve surgery  . PONV (postoperative nausea and vomiting)   . Septic arthritis of knee, right ([place]) 11/29/2011  . Spinal headache    "long time ago"    Past Surgical History:  Procedure Laterality Date  . 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 APPENDECTOMY    . CHOLECYSTECTOMY    . I & D EXTREMITY Left 01/12/2020   Procedure: IRRIGATION AND DEBRIDEMENT EXTREMITY;  Surgeon: Shona Needles, MD;  Location: [facility];  Service: Orthopedics;  Laterality: Left;  . I & D EXTREMITY Left 01/18/2020   Procedure: IRRIGATION AND DEBRIDEMENT EXTREMITY;  Surgeon: Newt Minion, MD;  Location: [facility];  Service: Orthopedics;  Laterality: Left;  . I & D EXTREMITY Left 01/27/2020   Procedure: EXCISIONAL DEBRIDEMENT LEFT KNEE WITH ANTIBIOTIC BEADS;  Surgeon: Newt Minion, MD;  Location: [facility];  Service: Orthopedics;  Laterality: Left;  . IRRIGATION AND DEBRIDEMENT KNEE  11/26/2011   Procedure: IRRIGATION AND DEBRIDEMENT KNEE;  Surgeon: Kerin Salen, MD;  Location: [facility];  Service: Orthopedics;  Laterality: Right;  . KNEE ARTHROSCOPY  10/24/2011   Procedure: ARTHROSCOPY KNEE;  Surgeon: Kerin Salen, MD;   Location: [facility];  Service: Orthopedics;  Laterality: Right;  . KNEE SURGERY     7 knee surgeries on right, and 4 knee surgeries left  . LEFT HEART CATH AND CORONARY ANGIOGRAPHY N/A 07/14/2016   Procedure: Left Heart Cath and Coronary Angiography;  Surgeon: Belva Crome, MD;  Location: [facility];  Service: Cardiovascular;  Laterality: N/A;  . SHOULDER ARTHROSCOPY  07/29/2011   Procedure: ARTHROSCOPY SHOULDER;  Surgeon: Mcarthur Rossetti, MD;  Location: [facility];  Service: Orthopedics;  Laterality: Left;  Left shoulder arthroscopy with minimal debridement, left wrist steroid injection  . SHOULDER SURGERY     3 surgeries on right, 2 surgeries on left  . ULNAR NERVE REPAIR      Family History  Problem Relation Age of Onset  . Heart attack Father   . COPD Father   . Diabetes Father   . Hypertension Father   . Heart attack Sister   . Arrhythmia Sister        Long QT syndrome, PPM  . Hypertension Mother   . Drug abuse Brother    Social History:  reports that he has never smoked. He has never used smokeless tobacco. He reports that he does not drink alcohol and does not use drugs.  Allergies:  Allergies  Allergen Reactions  . Ivp Dye [Iodinated Diagnostic Agents] Anaphylaxis    Can be pre-treated with Benadryl  . Metrizamide Anaphylaxis  . Other Other (See Comments)  Under no circumstances will the patient agree to a PICC line  . Reglan [Metoclopramide] Anaphylaxis  . Shellfish-Derived Products Anaphylaxis  . Codeine Hives  . Lidocaine Other (See Comments) and Rash    Pt not sure if this is an actual allergy - might have been a one time incident  . Methadone Hives  . Morphine Swelling and Rash    Local reaction to IV being pushed too fast  . Propoxyphene Hives  . Toradol [Ketorolac Tromethamine] Itching    Patient having systemic itching after receiving IV dose  . Iodine Rash    Reports localized reaction at IV site. Able to tolerate with Benadryl.   . Tape Rash  .  Vancomycin Rash    Has had vancomycin since this reaction and had no reaction at all    Medications Prior to Admission  Medication Sig Dispense Refill  . clonazePAM (KLONOPIN) 1 MG tablet Take 1 mg by mouth 2 (two) times daily as needed for anxiety.    Marland Kitchen EPINEPHrine 0.3 mg/0.3 mL IJ SOAJ injection Inject 0.3 mg into the muscle once as needed for anaphylaxis.    Marland Kitchen gabapentin (NEURONTIN) 300 MG capsule Take 1 capsule (300 mg total) by mouth 3 (three) times daily. (Patient taking differently: Take 600 mg by mouth 3 (three) times daily.) 30 capsule 0  . metoprolol succinate (TOPROL-XL) 50 MG 24 hr tablet Take 50 mg by mouth daily. Take with or immediately following a meal.    . naloxone (NARCAN) 4 MG/0.1ML LIQD nasal spray kit Place 1 spray into the nose as needed (overdose).    Marland Kitchen oxycodone (ROXICODONE) 30 MG immediate release tablet Take 30 mg by mouth 5 (five) times daily.    Marland Kitchen zolpidem (AMBIEN) 10 MG tablet Take 10 mg by mouth at bedtime.    Marland Kitchen apixaban (ELIQUIS) 5 MG TABS tablet Take 1 tablet (5 mg total) by mouth 2 (two) times daily. (Patient not taking: Reported on 07/25/2020) 60 tablet 0    Results for orders placed or performed during the hospital encounter of 07/25/20 (from the past 48 hour(s))  SARS CORONAVIRUS 2 (TAT 6-24 HRS) Nasopharyngeal Nasopharyngeal Swab     Status: None   Collection Time: 07/25/20  2:05 PM   Specimen: Nasopharyngeal Swab  Result Value Ref Range   SARS Coronavirus 2 NEGATIVE NEGATIVE    Comment: (NOTE) SARS-CoV-2 target nucleic acids are NOT DETECTED.  The SARS-CoV-2 RNA is generally detectable in upper and lower respiratory specimens during the acute phase of infection. Negative results do not preclude SARS-CoV-2 infection, do not rule out co-infections with other pathogens, and should not be used as the sole basis for treatment or other patient management decisions. Negative results must be combined with clinical observations, patient history, and  epidemiological information. The expected result is Negative.  Fact Sheet for Patients: SugarRoll.be  Fact Sheet for Healthcare Providers: https://www.woods-mathews.com/  This test is not yet approved or cleared by the [place] FDA and  has been authorized for detection and/or diagnosis of SARS-CoV-2 by FDA under an Emergency Use Authorization (EUA). This EUA will remain  in effect (meaning this test can be used) for the duration of the COVID-19 declaration under Se ction 564(b)(1) of the Act, 21 U.S.C. section 360bbb-3(b)(1), unless the authorization is terminated or revoked sooner.  Performed at [facility], [place] [address], [place], [place] [address]    No results found.  Review of Systems  All other systems reviewed and are negative.  Blood pressure (!) 161/96, pulse 60, temperature 98.3 F (36.8 C), temperature source Oral, resp. rate 17, height 5' 10"  (1.778 m), weight 90.7 kg, SpO2 96 %. Physical Exam  Patient is alert, oriented, no adenopathy, well-dressed, normal affect, normal respiratory effort. Examination patient has arthrofibrosis of the left knee he has range of motion from 30 to 50 degrees.  There is multiple scars around the knee but there is no redness no swelling no effusion no tenderness to palpation no clinical signs of acute infection.Heart RRR Lungs clear Assessment/Plan 1. Osteomyelitis of left knee region ([place])   2. Acute pain of left knee   3. Arthrofibrosis of knee joint, left     Plan: Patient's knee is stable no clinical signs or symptoms of infection.  We will plan for arthroscopic debridement with manipulation under anesthesia as outpatient.    Bevely Palmer Mohmmad Saleeby, PA 07/27/2020, 6:52 AM

## 2020-07-27 NOTE — Interval H&P Note (Signed)
History and Physical Interval Note:  07/27/2020 6:58 AM  Jason [name]  has presented today for surgery, with the diagnosis of Adhesions Left Knee.  The various methods of treatment have been discussed with the patient and family. After consideration of risks, benefits and other options for treatment, the patient has consented to  Procedure(s): LEFT KNEE ARTHROSCOPY, DEBRIDEMENT AND MANIPULATION UNDER ANESTHESIA (Left) as a surgical intervention.  The patient's history has been reviewed, patient examined, no change in status, stable for surgery.  I have reviewed the patient's chart and labs.  Questions were answered to the patient's satisfaction.     Newt Minion

## 2020-07-27 NOTE — Progress Notes (Signed)
Patient is still c/o right flank pain.  Described pain as sharp, burning, constant.    Gave Tylenol; awaiting his Dilaudid iv due at 2100.  Applied warm pack and repositioned.  Vitals WNL.  Paged on-call at [phone].  Awaiting call back.    07/27/2020 at 2058 On-call MD ordered chest x-ray and ABG test.

## 2020-07-27 NOTE — Discharge Instructions (Signed)

## 2020-07-27 NOTE — Progress Notes (Signed)
Call placed to [place], PA of Dr Sharol Given; message left on her voice mail about pt's pain and request for her to call back to discuss pt's complaints.    Pt continues to have R flank pain. States pain med has not helped R side (flank/ribs) area, but has helped his knee where he had surgery.  O2 sats have remained in the 90's up to 95% on RA, pt states he has seen the O2 sat monitor go down to 89% then back up to 92% when the alarm went off and he looked at monitor.   No SOB noted. Pt states that when he takes a deep breathe he can feel the sharp pain in his R side.  Await call back from PA.

## 2020-07-28 ENCOUNTER — Observation Stay (HOSPITAL_COMMUNITY): Payer: Medicare Other

## 2020-07-28 ENCOUNTER — Encounter (HOSPITAL_COMMUNITY): Payer: Self-pay | Admitting: Orthopedic Surgery

## 2020-07-28 DIAGNOSIS — M24662 Ankylosis, left knee: Secondary | ICD-10-CM | POA: Diagnosis not present

## 2020-07-28 DIAGNOSIS — R1011 Right upper quadrant pain: Secondary | ICD-10-CM

## 2020-07-28 LAB — COMPREHENSIVE METABOLIC PANEL
ALT: 18 U/L (ref 0–44)
AST: 17 U/L (ref 15–41)
Albumin: 3.4 g/dL — ABNORMAL LOW (ref 3.5–5.0)
Alkaline Phosphatase: 95 U/L (ref 38–126)
Anion gap: 6 (ref 5–15)
BUN: 22 mg/dL — ABNORMAL HIGH (ref 6–20)
CO2: 25 mmol/L (ref 22–32)
Calcium: 8.3 mg/dL — ABNORMAL LOW (ref 8.9–10.3)
Chloride: 109 mmol/L (ref 98–111)
Creatinine, Ser: 1.07 mg/dL (ref 0.61–1.24)
GFR, Estimated: >60 mL/min (ref >60)
Glucose, Bld: 125 mg/dL — ABNORMAL HIGH (ref 70–99)
Potassium: 3.8 mmol/L (ref 3.5–5.1)
Sodium: 140 mmol/L (ref 135–145)
Total Bilirubin: 0.6 mg/dL (ref 0.3–1.2)
Total Protein: 5.9 g/dL — ABNORMAL LOW (ref 6.5–8.1)

## 2020-07-28 LAB — LIPASE, BLOOD: Lipase: 28 U/L (ref 11–51)

## 2020-07-28 NOTE — Consult Note (Signed)
[facility] Medical Consultation  CORNELUIS ALLSTON DEY:[id number] DOB: [birth year] DOA: 07/27/2020 PCP: Medicine, [facility]   Requesting physician: Meridee Score, MD Date of consultation: 07/28/2020 Reason for consultation: Abdominal pain  Impression/Recommendations Active Problems:   Arthrofibrosis of knee joint, left   Arthrofibrosis of knee joint    1. Left knee adhesions: Patient is status post arthroscopy with debridement of the left knee by Dr. Sharol Given on 5/13. -Per orthopedics  2. Right upper quadrant abdominal pain: Patient reports waking up with right upper quadrant abdominal pain that radiates to his groin.  On physical exam patient nontender to palpation, but room releasing pressure reported pain.  Patient status post cholecystectomy and appendectomy.  He had a previous history of what he reports as twisting of his bowels in the past requiring resection.  On physical exam he does have the midline abdominal scar along with laparoscopic scars of the abdomen.  CMP and lipase were within normal limits. CT scan of the abdomen without contrast that did not reveal any clear cause of patient's symptoms. Question possibility of like a pulled muscle or spasm.  - Would recommend supportive care at this time and no need for any further work-up.  Patient to follow-up with his primary care provider in the outpatient setting if symptoms persist.  3. History of PE : Patient had PE back in 12/2019. Eliquis had been discontinued for 5 days prior to that procedure and has been able to be restarted thereafter.  Low suspicion for intra-abdominal clot at this time. -Continue Eliquis  I will followup again tomorrow. Please contact me if I can be of assistance in the meanwhile. Thank you for this consultation.  Chief Complaint: Left knee pain  HPI:  Jason [name] is a 55 y.o. male with medical history significant of HTN, chronic pain with opioid dependence, left knee septic joint,  s/p right AKA, PE on Eliquis, and previous documentation of Munchhausen syndrome who presented on 5/13 for surgical evaluation of the left knee with the patient has been having significant pain.  He had history of multitrauma to the left knee with multiple open and arthroscopic surgical interventions for septic joint in the past.  He underwent arthroscopic debridement for adhesions of the left knee yesterday with Dr. Sharol Given.  When the patient woke up he complained of having significant right upper quadrant abdominal pain.  Pain is sharp and constant and we will radiate to his groin.  Pain would not go away despite him taking pain medications provided postprocedure.  Associated symptoms included nausea, dry heaves, clamminess, and reported least 1 episode of emesis of stomach contents.  Denies seeing any blood.  His last bowel movement was yesterday morning reportedly.  Patient notes that his stomach is not tender to palpation, but pain is reproduced when the release off pressure in that area.  He has a significant surgical history including cholecystectomy, appendectomy, and reports of volvulus requiring bowel resection.   Review of Systems  Constitutional: Positive for diaphoresis.  Respiratory: Negative for cough and shortness of breath.   Cardiovascular: Negative for chest pain and leg swelling.  Gastrointestinal: Positive for abdominal pain, nausea and vomiting. Negative for diarrhea.     Past Medical History:  Diagnosis Date  . Allergy   . Anxiety   . Arthritis    left shoulder  . Bronchitis   . Chest pain 07/14/2016  . Complication of anesthesia   . DJD (degenerative joint disease) 11/29/2011  . Hypertension   .  Neuromuscular disorder ([place])    carpal tunnel bilateral, ulner nerve surgery  . PONV (postoperative nausea and vomiting)   . Septic arthritis of knee, right ([place]) 11/29/2011  . Spinal headache    "long time ago"   Past Surgical History:  Procedure Laterality Date  . ABOVE KNEE  LEG AMPUTATION Right 05/26/2014  . APPENDECTOMY    . CHOLECYSTECTOMY    . I & D EXTREMITY Left 01/12/2020   Procedure: IRRIGATION AND DEBRIDEMENT EXTREMITY;  Surgeon: Shona Needles, MD;  Location: [place];  Service: Orthopedics;  Laterality: Left;  . I & D EXTREMITY Left 01/18/2020   Procedure: IRRIGATION AND DEBRIDEMENT EXTREMITY;  Surgeon: Newt Minion, MD;  Location: [place];  Service: Orthopedics;  Laterality: Left;  . I & D EXTREMITY Left 01/27/2020   Procedure: EXCISIONAL DEBRIDEMENT LEFT KNEE WITH ANTIBIOTIC BEADS;  Surgeon: Newt Minion, MD;  Location: [place];  Service: Orthopedics;  Laterality: Left;  . IRRIGATION AND DEBRIDEMENT KNEE  11/26/2011   Procedure: IRRIGATION AND DEBRIDEMENT KNEE;  Surgeon: Kerin Salen, MD;  Location: [place];  Service: Orthopedics;  Laterality: Right;  . KNEE ARTHROSCOPY  10/24/2011   Procedure: ARTHROSCOPY KNEE;  Surgeon: Kerin Salen, MD;  Location: [place];  Service: Orthopedics;  Laterality: Right;  . KNEE ARTHROSCOPY Left 07/27/2020   Procedure: LEFT KNEE ARTHROSCOPY, DEBRIDEMENT AND MANIPULATION UNDER ANESTHESIA;  Surgeon: Newt Minion, MD;  Location: [place];  Service: Orthopedics;  Laterality: Left;  . KNEE SURGERY     7 knee surgeries on right, and 4 knee surgeries left  . LEFT HEART CATH AND CORONARY ANGIOGRAPHY N/A 07/14/2016   Procedure: Left Heart Cath and Coronary Angiography;  Surgeon: Belva Crome, MD;  Location: [facility];  Service: Cardiovascular;  Laterality: N/A;  . SHOULDER ARTHROSCOPY  07/29/2011   Procedure: ARTHROSCOPY SHOULDER;  Surgeon: Mcarthur Rossetti, MD;  Location: [place];  Service: Orthopedics;  Laterality: Left;  Left shoulder arthroscopy with minimal debridement, left wrist steroid injection  . SHOULDER SURGERY     3 surgeries on right, 2 surgeries on left  . ULNAR NERVE REPAIR     Social History:  reports that he has never smoked. He has never used smokeless tobacco. He reports that he does not drink alcohol and  does not use drugs.  Allergies  Allergen Reactions  . Ivp Dye [Iodinated Diagnostic Agents] Anaphylaxis    Can be pre-treated with Benadryl  . Metrizamide Anaphylaxis  . Other Other (See Comments)    Under no circumstances will the patient agree to a PICC line  . Reglan [Metoclopramide] Anaphylaxis  . Shellfish-Derived Products Anaphylaxis  . Codeine Hives  . Lidocaine Other (See Comments) and Rash    Pt not sure if this is an actual allergy - might have been a one time incident  . Methadone Hives  . Morphine Swelling and Rash    Local reaction to IV being pushed too fast  . Propoxyphene Hives  . Toradol [Ketorolac Tromethamine] Itching    Patient having systemic itching after receiving IV dose  . Iodine Rash    Reports localized reaction at IV site. Able to tolerate with Benadryl.   . Tape Rash  . Vancomycin Rash    Has had vancomycin since this reaction and had no reaction at all   Family History  Problem Relation Age of Onset  . Heart attack Father   . COPD Father   . Diabetes Father   .  Hypertension Father   . Heart attack Sister   . Arrhythmia Sister        Long QT syndrome, PPM  . Hypertension Mother   . Drug abuse Brother     Prior to Admission medications   Medication Sig Start Date End Date Taking? Authorizing Provider  apixaban (ELIQUIS) 5 MG TABS tablet Take 1 tablet (5 mg total) by mouth 2 (two) times daily. 03/21/20  Yes Isla Pence, MD  clonazePAM (KLONOPIN) 1 MG tablet Take 1 mg by mouth 2 (two) times daily as needed for anxiety.   Yes [provider]  EPINEPHrine 0.3 mg/0.3 mL IJ SOAJ injection Inject 0.3 mg into the muscle once as needed for anaphylaxis. 05/28/16  Yes [provider]  gabapentin (NEURONTIN) 300 MG capsule Take 1 capsule (300 mg total) by mouth 3 (three) times daily. Patient taking differently: Take 600 mg by mouth 3 (three) times daily. 02/01/20  Yes Aline August, MD  metoprolol succinate (TOPROL-XL) 50 MG 24 hr  tablet Take 50 mg by mouth daily. Take with or immediately following a meal.   Yes [provider]  naloxone (NARCAN) 4 MG/0.1ML LIQD nasal spray kit Place 1 spray into the nose as needed (overdose).   Yes [provider]  oxycodone (ROXICODONE) 30 MG immediate release tablet Take 30 mg by mouth 5 (five) times daily.   Yes [provider]  oxyCODONE-acetaminophen (PERCOCET) 10-325 MG tablet Take 1 tablet by mouth every 12 (twelve) hours as needed for pain. 07/27/20  Yes Persons, Bevely Palmer, PA  zolpidem (AMBIEN) 10 MG tablet Take 10 mg by mouth at bedtime.   Yes [provider]   Physical Exam:  Constitutional:  Vitals:   07/27/20 2100 07/28/20 0000 07/28/20 0500 07/28/20 0738  BP: 132/77 124/72 124/76 114/68  Pulse: 77 70 70 71  Resp: _0 Temp: 98 F (36.7 C) 98.3 F (36.8 C) 98.4 F (36.9 C) 98.1 F (36.7 C)  TempSrc: Oral Oral Oral   SpO2: 96% 96% 95% 94%  Weight:      Height:       Eyes: PERRL, lids and conjunctivae normal ENMT: Mucous membranes are moist. Posterior pharynx clear of any exudate or lesions.  Neck: normal, supple, no masses, no thyromegaly Respiratory: clear to auscultation bilaterally, no wheezing, no crackles. Normal respiratory effort. No accessory muscle use.  Cardiovascular: Regular rate and rhythm, no murmurs / rubs / gallops. No extremity edema. 2+ pedal pulses. No carotid bruits.  Abdomen: No tenderness to palpation of the right upper quadrant, but upon releasing pressure patient complained of pain.  Bowel sounds normal in all 4 quadrants.  And Musculoskeletal: no clubbing / cyanosis.  Right AKA Skin: no rashes, lesions, ulcers. No induration Neurologic: CN 2-12 grossly intact. Sensation intact, DTR normal. Strength 5/5 in all 4.  Psychiatric: Normal judgment and insight. Alert and oriented x 3. Normal mood.   Labs on Admission:  Basic Metabolic Panel: Recent Labs  Lab 07/27/20 0552 07/28/20 1001  NA 139  140  K 3.5 3.8  CL 106 109  CO2 23 25  GLUCOSE 104* 125*  BUN 21* 22*  CREATININE 0.92 1.07  CALCIUM 9.0 8.3*   Liver Function Tests: Recent Labs  Lab 07/28/20 1001  AST 17  ALT 18  ALKPHOS 95  BILITOT 0.6  PROT 5.9*  ALBUMIN 3.4*   Recent Labs  Lab 07/28/20 1001  LIPASE 28   No results for input(s): AMMONIA in the last  168 hours. CBC: Recent Labs  Lab 07/27/20 2145  WBC 10.1  NEUTROABS 8.2*  HGB 13.0  HCT 38.7*  MCV 86.6  PLT 249   Cardiac Enzymes: No results for input(s): CKTOTAL, CKMB, CKMBINDEX, TROPONINI in the last 168 hours. BNP: Invalid input(s): POCBNP CBG: No results for input(s): GLUCAP in the last 168 hours.  Radiological Exams on Admission: CT ABDOMEN PELVIS WO CONTRAST  Result Date: 07/28/2020 CLINICAL DATA:  Pt has nonlocalized abdominal pain after surgery to left leg. EXAM: CT ABDOMEN AND PELVIS WITHOUT CONTRAST TECHNIQUE: Multidetector CT imaging of the abdomen and pelvis was performed following the standard protocol without IV contrast. COMPARISON:  CT abdomen pelvis 01/21/2020 FINDINGS: Lower chest: No acute abnormality. Evaluation of the abdominal viscera somewhat limited by the lack of IV contrast. Hepatobiliary: No focal liver abnormality is seen. Status post cholecystectomy. Pancreas: Unremarkable. No surrounding inflammatory changes. Spleen: Normal in size without focal abnormality. Adrenals/Urinary Tract: Adrenal glands are unremarkable. Kidneys are normal, without renal calculi, focal lesion, or hydronephrosis. Visualized portion the bladder is unremarkable., somewhat obscured by metallic artifact from right hip prosthetic. Stomach/Bowel: Stomach is within normal limits. Appendix is surgically absent. No evidence of bowel wall thickening, distention, or inflammatory changes. Vascular/Lymphatic: Mild aortic atherosclerosis. No enlarged abdominal or pelvic lymph nodes. Reproductive: Prostate is unremarkable. Other: Fat containing left inguinal  hernia. Postsurgical changes noted along the midline anterior abdominal wall. Musculoskeletal: Right hip prosthetic. No acute osseous abnormality. IMPRESSION: 1. No acute findings in the abdomen or pelvis on a noncontrast exam. 2. Fat containing left inguinal hernia. 3. Aortic atherosclerosis. Aortic Atherosclerosis (ICD10-I70.0). Electronically Signed   By: Audie Pinto M.D.   On: 07/28/2020 09:50   DG Chest Port 1 View  Result Date: 07/27/2020 CLINICAL DATA:  Flank pain. EXAM: PORTABLE CHEST 1 VIEW COMPARISON:  Radiograph 03/21/2020.  CT 03/16/2020 FINDINGS: The cardiomediastinal contours are normal. Subsegmental atelectasis or scarring at the left lung base. Pulmonary vasculature is normal. No consolidation, pleural effusion, or pneumothorax. No acute osseous abnormalities are seen. IMPRESSION: Subsegmental atelectasis or scarring at the left lung base. Electronically Signed   By: Keith Rake M.D.   On: 07/27/2020 22:55      Time spent: >45 minutes  [facility]   If 7PM-7AM, please contact night-coverage

## 2020-07-28 NOTE — Discharge Summary (Signed)
Discharge Diagnoses:  Active Problems:   Arthrofibrosis of knee joint, left   Arthrofibrosis of knee joint   Surgeries: Procedure(s): LEFT KNEE ARTHROSCOPY, DEBRIDEMENT AND MANIPULATION UNDER ANESTHESIA on 07/27/2020    Consultants:   Discharged Condition: Improved  Hospital Course: Jason [name] is an 55 y.o. male who was admitted 07/27/2020 with a chief complaint of arthrofibrosis left knee, with a final diagnosis of Adhesions Left Knee.  Patient was brought to the operating room on 07/27/2020 and underwent Procedure(s): LEFT KNEE ARTHROSCOPY, DEBRIDEMENT AND MANIPULATION UNDER ANESTHESIA.    Patient was given perioperative antibiotics:  Anti-infectives (From admission, onward)   Start     Dose/Rate Route Frequency Ordered Stop   07/27/20 0600  ceFAZolin (ANCEF) IVPB 2g/100 mL premix        2 g 200 mL/hr over 30 Minutes Intravenous On call to O.R. 07/27/20 2725 07/27/20 0813    .  Patient was given sequential compression devices, early ambulation, and aspirin for DVT prophylaxis.  Recent vital signs:  Patient Vitals for the past 24 hrs:  BP Temp Temp src Pulse Resp SpO2  07/28/20 0738 114/68 98.1 F (36.7 C) -- 71 20 94 %  07/28/20 0500 124/76 98.4 F (36.9 C) Oral 70 17 95 %  07/28/20 0000 124/72 98.3 F (36.8 C) Oral 70 17 96 %  07/27/20 2100 132/77 98 F (36.7 C) Oral 77 18 96 %  07/27/20 1707 -- -- -- -- 20 95 %  07/27/20 1415 114/71 98.4 F (36.9 C) Oral 63 17 93 %  07/27/20 1400 -- -- -- -- 16 94 %  07/27/20 1211 (!) 134/94 98.1 F (36.7 C) Oral 65 17 95 %  07/27/20 1156 -- 97.8 F (36.6 C) -- -- -- --  07/27/20 1130 132/77 -- -- 69 11 95 %  07/27/20 1100 135/74 (!) 97 F (36.1 C) -- 71 11 92 %  07/27/20 1045 (!) 144/81 -- -- 67 (!) 9 98 %  07/27/20 1030 (!) 141/91 -- -- 70 (!) 9 98 %  07/27/20 1015 (!) 138/91 -- -- 71 10 97 %  07/27/20 1000 123/82 -- -- 70 10 96 %  07/27/20 0945 (!) 137/94 -- -- 71 (!) 9 94 %  07/27/20 0930 (!) 147/93 -- -- 73 13 95 %   07/27/20 0915 (!) 143/96 -- -- 71 16 92 %  07/27/20 0904 -- -- -- 66 16 100 %  07/27/20 0900 138/90 (!) 97 F (36.1 C) -- 66 13 100 %  .  Recent laboratory studies: DG Chest Port 1 View  Result Date: 07/27/2020 CLINICAL DATA:  Flank pain. EXAM: PORTABLE CHEST 1 VIEW COMPARISON:  Radiograph 03/21/2020.  CT 03/16/2020 FINDINGS: The cardiomediastinal contours are normal. Subsegmental atelectasis or scarring at the left lung base. Pulmonary vasculature is normal. No consolidation, pleural effusion, or pneumothorax. No acute osseous abnormalities are seen. IMPRESSION: Subsegmental atelectasis or scarring at the left lung base. Electronically Signed   By: Keith Rake M.D.   On: 07/27/2020 22:55    Discharge Medications:   Allergies as of 07/28/2020      Reactions   Ivp Dye [iodinated Diagnostic Agents] Anaphylaxis   Can be pre-treated with Benadryl   Metrizamide Anaphylaxis   Other Other (See Comments)   Under no circumstances will the patient agree to a PICC line   Reglan [metoclopramide] Anaphylaxis   Shellfish-derived Products Anaphylaxis   Codeine Hives   Lidocaine Other (See Comments), Rash   Pt not sure if this is  an actual allergy - might have been a one time incident   Methadone Hives   Morphine Swelling, Rash   Local reaction to IV being pushed too fast   Propoxyphene Hives   Toradol [ketorolac Tromethamine] Itching   Patient having systemic itching after receiving IV dose   Iodine Rash   Reports localized reaction at IV site. Able to tolerate with Benadryl.    Tape Rash   Vancomycin Rash   Has had vancomycin since this reaction and had no reaction at all      Medication List    TAKE these medications   apixaban 5 MG Tabs tablet Commonly known as: ELIQUIS Take 1 tablet (5 mg total) by mouth 2 (two) times daily.   clonazePAM 1 MG tablet Commonly known as: KLONOPIN Take 1 mg by mouth 2 (two) times daily as needed for anxiety.   EPINEPHrine 0.3 mg/0.3 mL Soaj  injection Commonly known as: EPI-PEN Inject 0.3 mg into the muscle once as needed for anaphylaxis.   gabapentin 300 MG capsule Commonly known as: NEURONTIN Take 1 capsule (300 mg total) by mouth 3 (three) times daily. What changed: how much to take   metoprolol succinate 50 MG 24 hr tablet Commonly known as: TOPROL-XL Take 50 mg by mouth daily. Take with or immediately following a meal.   Narcan 4 MG/0.1ML Liqd nasal spray kit Generic drug: naloxone Place 1 spray into the nose as needed (overdose).   oxycodone 30 MG immediate release tablet Commonly known as: ROXICODONE Take 30 mg by mouth 5 (five) times daily.   oxyCODONE-acetaminophen 10-325 MG tablet Commonly known as: Percocet Take 1 tablet by mouth every 12 (twelve) hours as needed for pain.   zolpidem 10 MG tablet Commonly known as: AMBIEN Take 10 mg by mouth at bedtime.            Durable Medical Equipment  (From admission, onward)         Start     Ordered   07/27/20 0000  For home use only DME Crutches        07/27/20 0904           Discharge Care Instructions  (From admission, onward)         Start     Ordered   07/27/20 0000  Weight bearing as tolerated        07/27/20 0904          Diagnostic Studies: DG Chest Port 1 View  Result Date: 07/27/2020 CLINICAL DATA:  Flank pain. EXAM: PORTABLE CHEST 1 VIEW COMPARISON:  Radiograph 03/21/2020.  CT 03/16/2020 FINDINGS: The cardiomediastinal contours are normal. Subsegmental atelectasis or scarring at the left lung base. Pulmonary vasculature is normal. No consolidation, pleural effusion, or pneumothorax. No acute osseous abnormalities are seen. IMPRESSION: Subsegmental atelectasis or scarring at the left lung base. Electronically Signed   By: Keith Rake M.D.   On: 07/27/2020 22:55   XR Knee 1-2 Views Left  Result Date: 07/16/2020 2 view radiographs of the left knee shows previous lytic changes of the medial joint line without any interval  changes.  No interval change from previous radiographs no indication of advancement of the previous septic joint   Patient benefited maximally from their hospital stay and there were no complications.     Disposition: Discharge disposition: 01-Home or Self Care      Discharge Instructions    Call MD / Call 911   Complete by: As directed    If  you experience chest pain or shortness of breath, CALL 911 and be transported to the hospital emergency room.  If you develope a fever above 101 F, pus (white drainage) or increased drainage or redness at the wound, or calf pain, call your surgeon's office.   Call MD / Call 911   Complete by: As directed    If you experience chest pain or shortness of breath, CALL 911 and be transported to the hospital emergency room.  If you develope a fever above 101 F, pus (white drainage) or increased drainage or redness at the wound, or calf pain, call your surgeon's office.   Call MD / Call 911   Complete by: As directed    If you experience chest pain or shortness of breath, CALL 911 and be transported to the hospital emergency room.  If you develope a fever above 101 F, pus (white drainage) or increased drainage or redness at the wound, or calf pain, call your surgeon's office.   Constipation Prevention   Complete by: As directed    Drink plenty of fluids.  Prune juice may be helpful.  You may use a stool softener, such as Colace (over the counter) 100 mg twice a day.  Use MiraLax (over the counter) for constipation as needed.   Constipation Prevention   Complete by: As directed    Drink plenty of fluids.  Prune juice may be helpful.  You may use a stool softener, such as Colace (over the counter) 100 mg twice a day.  Use MiraLax (over the counter) for constipation as needed.   Constipation Prevention   Complete by: As directed    Drink plenty of fluids.  Prune juice may be helpful.  You may use a stool softener, such as Colace (over the counter) 100 mg  twice a day.  Use MiraLax (over the counter) for constipation as needed.   Diet - low sodium heart healthy   Complete by: As directed    Diet - low sodium heart healthy   Complete by: As directed    Diet - low sodium heart healthy   Complete by: As directed    Discharge instructions   Complete by: As directed    Keep dressing dry.elevate left Knee Weightbearing as tolerated   Discharge instructions   Complete by: As directed    Elevate left leg, weightbearing as tolerated   For home use only DME Crutches   Complete by: As directed    Increase activity slowly as tolerated   Complete by: As directed    Increase activity slowly as tolerated   Complete by: As directed    Increase activity slowly as tolerated   Complete by: As directed    Post-operative opioid taper instructions:   Complete by: As directed    POST-OPERATIVE OPIOID TAPER INSTRUCTIONS: It is important to wean off of your opioid medication as soon as possible. If you do not need pain medication after your surgery it is ok to stop day one. Opioids include: Codeine, Hydrocodone(Norco, Vicodin), Oxycodone(Percocet, oxycontin) and hydromorphone amongst others.  Long term and even short term use of opiods can cause: Increased pain response Dependence Constipation Depression Respiratory depression And more.  Withdrawal symptoms can include Flu like symptoms Nausea, vomiting And more Techniques to manage these symptoms Hydrate well Eat regular healthy meals Stay active Use relaxation techniques(deep breathing, meditating, yoga) Do Not substitute Alcohol to help with tapering If you have been on opioids for less than two weeks and do  not have pain than it is ok to stop all together.  Plan to wean off of opioids This plan should start within one week post op of your joint replacement. Maintain the same interval or time between taking each dose and first decrease the dose.  Cut the total daily intake of opioids by one  tablet each day Next start to increase the time between doses. The last dose that should be eliminated is the evening dose.      Post-operative opioid taper instructions:   Complete by: As directed    POST-OPERATIVE OPIOID TAPER INSTRUCTIONS: It is important to wean off of your opioid medication as soon as possible. If you do not need pain medication after your surgery it is ok to stop day one. Opioids include: Codeine, Hydrocodone(Norco, Vicodin), Oxycodone(Percocet, oxycontin) and hydromorphone amongst others.  Long term and even short term use of opiods can cause: Increased pain response Dependence Constipation Depression Respiratory depression And more.  Withdrawal symptoms can include Flu like symptoms Nausea, vomiting And more Techniques to manage these symptoms Hydrate well Eat regular healthy meals Stay active Use relaxation techniques(deep breathing, meditating, yoga) Do Not substitute Alcohol to help with tapering If you have been on opioids for less than two weeks and do not have pain than it is ok to stop all together.  Plan to wean off of opioids This plan should start within one week post op of your joint replacement. Maintain the same interval or time between taking each dose and first decrease the dose.  Cut the total daily intake of opioids by one tablet each day Next start to increase the time between doses. The last dose that should be eliminated is the evening dose.      Post-operative opioid taper instructions:   Complete by: As directed    POST-OPERATIVE OPIOID TAPER INSTRUCTIONS: It is important to wean off of your opioid medication as soon as possible. If you do not need pain medication after your surgery it is ok to stop day one. Opioids include: Codeine, Hydrocodone(Norco, Vicodin), Oxycodone(Percocet, oxycontin) and hydromorphone amongst others.  Long term and even short term use of opiods can cause: Increased pain  response Dependence Constipation Depression Respiratory depression And more.  Withdrawal symptoms can include Flu like symptoms Nausea, vomiting And more Techniques to manage these symptoms Hydrate well Eat regular healthy meals Stay active Use relaxation techniques(deep breathing, meditating, yoga) Do Not substitute Alcohol to help with tapering If you have been on opioids for less than two weeks and do not have pain than it is ok to stop all together.  Plan to wean off of opioids This plan should start within one week post op of your joint replacement. Maintain the same interval or time between taking each dose and first decrease the dose.  Cut the total daily intake of opioids by one tablet each day Next start to increase the time between doses. The last dose that should be eliminated is the evening dose.      Weight bearing as tolerated   Complete by: As directed       Follow-up Information    Suzan Slick, NP In 1 week.   Specialty: Orthopedic Surgery Contact information: [address] [place] [address] [phone]                Signed: Newt Minion 07/28/2020, 8:37 AM

## 2020-07-28 NOTE — Progress Notes (Signed)
Patient ID: Jason [name], male   DOB: 13-Oct-1965, 55 y.o.   MRN: [id number] Patient is postoperative day 1 debridement arthrofibrosis left knee.  Patient states he does have a little bit of pain in the knee but most of his pain is in his right abdomen.  Patient's abdomen was nontender to palpation postoperatively.    Examination today the abdomen is nontender to palpation in all 4 quadrants.  Patient states the pain radiates deep in the abdomen down to the right thigh.  Patient states he did have a bowel movement the day before surgery.    I have requested hospitalist consult to evaluate patient prior to discharge today.

## 2020-07-28 NOTE — Progress Notes (Signed)
Patient ID: Jason [name], male   DOB: October 04, 1965, 55 y.o.   MRN: [id number] Patient complaining of persistent flank pain right flank. States similar to when he had previous PE.  I restarted eloquis one hour earlier than ordered at 9 PM Obtained PCXR ABG done ABG shows saturation 94 percent but O2 67 CO2 and Ph normal CXR with subsegmental atelectasis Started on incentive spirometry, cough and turning. 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 CXR with atelectasis with explains decreased pO2 Breathing normally, rate 17/min.

## 2020-07-28 NOTE — Care Management Obs Status (Signed)
[facility] NOTIFICATION   Patient Details  Name: HINES KLOSS MRN: [id number] Date of Birth: 08-16-1965   Medicare Observation Status Notification Given:  Yes    Bartholomew Crews, RN 07/28/2020, 9:18 AM

## 2020-07-28 NOTE — Progress Notes (Signed)
Nsg Discharge Note  Admit Date:  07/27/2020 Discharge date: 07/28/2020   Jason [name] to be D/C'd  per MD order.  AVS completed.   Patient/caregiver able to verbalize understanding.  Discharge Medication: Allergies as of 07/28/2020      Reactions   Ivp Dye [iodinated Diagnostic Agents] Anaphylaxis   Can be pre-treated with Benadryl   Metrizamide Anaphylaxis   Other Other (See Comments)   Under no circumstances will the patient agree to a PICC line   Reglan [metoclopramide] Anaphylaxis   Shellfish-derived Products Anaphylaxis   Codeine Hives   Lidocaine Other (See Comments), Rash   Pt not sure if this is an actual allergy - might have been a one time incident   Methadone Hives   Morphine Swelling, Rash   Local reaction to IV being pushed too fast   Propoxyphene Hives   Toradol [ketorolac Tromethamine] Itching   Patient having systemic itching after receiving IV dose   Iodine Rash   Reports localized reaction at IV site. Able to tolerate with Benadryl.    Tape Rash   Vancomycin Rash   Has had vancomycin since this reaction and had no reaction at all      Medication List    TAKE these medications   apixaban 5 MG Tabs tablet Commonly known as: ELIQUIS Take 1 tablet (5 mg total) by mouth 2 (two) times daily.   clonazePAM 1 MG tablet Commonly known as: KLONOPIN Take 1 mg by mouth 2 (two) times daily as needed for anxiety.   EPINEPHrine 0.3 mg/0.3 mL Soaj injection Commonly known as: EPI-PEN Inject 0.3 mg into the muscle once as needed for anaphylaxis.   gabapentin 300 MG capsule Commonly known as: NEURONTIN Take 1 capsule (300 mg total) by mouth 3 (three) times daily. What changed: how much to take   metoprolol succinate 50 MG 24 hr tablet Commonly known as: TOPROL-XL Take 50 mg by mouth daily. Take with or immediately following a meal.   Narcan 4 MG/0.1ML Liqd nasal spray kit Generic drug: naloxone Place 1 spray into the nose as needed (overdose).   oxycodone  30 MG immediate release tablet Commonly known as: ROXICODONE Take 30 mg by mouth 5 (five) times daily.   oxyCODONE-acetaminophen 10-325 MG tablet Commonly known as: Percocet Take 1 tablet by mouth every 12 (twelve) hours as needed for pain.   zolpidem 10 MG tablet Commonly known as: AMBIEN Take 10 mg by mouth at bedtime.            Durable Medical Equipment  (From admission, onward)         Start     Ordered   07/27/20 0000  For home use only DME Crutches        07/27/20 0904           Discharge Care Instructions  (From admission, onward)         Start     Ordered   07/27/20 0000  Weight bearing as tolerated        07/27/20 0904          Discharge Assessment: Vitals:   07/28/20 0500 07/28/20 0738  BP: 124/76 114/68  Pulse: 70 71  Resp: 17 20  Temp: 98.4 F (36.9 C) 98.1 F (36.7 C)  SpO2: 95% 94%   Skin clean, dry and intact without evidence of skin break down, no evidence of skin tears noted. IV catheter discontinued intact. Site without signs and symptoms of complications - no redness  or edema noted at insertion site, patient denies c/o pain - only slight tenderness at site.  Dressing with slight pressure applied.  D/c Instructions-Education: Discharge instructions given to patient/family with verbalized understanding. D/c education completed with patient/family including follow up instructions, medication list, d/c activities limitations if indicated, with other d/c instructions as indicated by MD - patient able to verbalize understanding, all questions fully answered. Patient instructed to return to ED, call 911, or call MD for any changes in condition.  Patient escorted via [place], and D/C home via private auto.  Earlie Server Abrial Arrighi RN 07/28/2020 12:45 PM

## 2020-07-28 NOTE — Plan of Care (Signed)
  Problem: Health Behavior/Discharge Planning: Goal: Ability to manage health-related needs will improve Outcome: Completed/Met  Son will pick patient up

## 2020-07-28 NOTE — TOC Transition Note (Signed)
Transition of Care [facility]) - CM/SW Discharge Note   Patient Details  Name: Jason [name] MRN: [id number] Date of Birth: 1965/09/05  [facility] CM/SW Contact:  Bartholomew Crews, RN Phone Number: [phone] 07/28/2020, 9:22 AM   Clinical Narrative:     Spoke with patient at the bedside. Wheelchair at bedside. Has transportation home when ready for discharge. No further TOC needs identified.   Final next level of care: Home/Self Care Barriers to Discharge: No Barriers Identified   Patient Goals and CMS Choice Patient states their goals for this hospitalization and ongoing recovery are:: return home CMS Medicare.gov Compare Post Acute Care list provided to:: Patient Choice offered to / list presented to : NA  Discharge Placement                       Discharge Plan and Services                DME Arranged: N/A DME Agency: NA       HH Arranged: NA HH Agency: NA        Social Determinants of Health (SDOH) Interventions     Readmission Risk Interventions Readmission Risk Prevention Plan 02/01/2020  Transportation Screening Complete  Palliative Care Screening Not Applicable  Some recent data might be hidden

## 2020-07-31 ENCOUNTER — Telehealth: Payer: Self-pay | Admitting: Orthopedic Surgery

## 2020-07-31 NOTE — Telephone Encounter (Signed)
laura from home health called because pt is wondering if he can start back with them! She wants a call back [phone]--

## 2020-07-31 NOTE — Telephone Encounter (Signed)
Let see him for 1st post op first

## 2020-07-31 NOTE — Telephone Encounter (Signed)
HH aware of the below message

## 2020-08-14 ENCOUNTER — Encounter: Payer: Medicare Other | Admitting: Orthopedic Surgery

## 2020-08-16 ENCOUNTER — Other Ambulatory Visit: Payer: Self-pay

## 2020-08-16 ENCOUNTER — Emergency Department (HOSPITAL_COMMUNITY): Payer: Medicare Other

## 2020-08-16 ENCOUNTER — Emergency Department (HOSPITAL_BASED_OUTPATIENT_CLINIC_OR_DEPARTMENT_OTHER): Payer: Medicare Other

## 2020-08-16 ENCOUNTER — Encounter (HOSPITAL_COMMUNITY): Payer: Self-pay | Admitting: Emergency Medicine

## 2020-08-16 ENCOUNTER — Emergency Department (HOSPITAL_COMMUNITY)
Admission: EM | Admit: 2020-08-16 | Discharge: 2020-08-17 | Disposition: A | Discharge status: Home / Self Care | Payer: Medicare Other | Attending: Emergency Medicine | Admitting: Emergency Medicine

## 2020-08-16 ENCOUNTER — Telehealth: Payer: Self-pay | Admitting: Orthopedic Surgery

## 2020-08-16 ENCOUNTER — Telehealth: Payer: Self-pay

## 2020-08-16 ENCOUNTER — Ambulatory Visit: Payer: Self-pay

## 2020-08-16 ENCOUNTER — Ambulatory Visit (INDEPENDENT_AMBULATORY_CARE_PROVIDER_SITE_OTHER): Payer: Medicare Other | Admitting: Orthopedic Surgery

## 2020-08-16 DIAGNOSIS — Y32XXXA Crashing of motor vehicle, undetermined intent, initial encounter: Secondary | ICD-10-CM | POA: Diagnosis not present

## 2020-08-16 DIAGNOSIS — I10 Essential (primary) hypertension: Secondary | ICD-10-CM | POA: Insufficient documentation

## 2020-08-16 DIAGNOSIS — Z7901 Long term (current) use of anticoagulants: Secondary | ICD-10-CM | POA: Diagnosis not present

## 2020-08-16 DIAGNOSIS — Z79899 Other long term (current) drug therapy: Secondary | ICD-10-CM | POA: Diagnosis not present

## 2020-08-16 DIAGNOSIS — M79662 Pain in left lower leg: Secondary | ICD-10-CM | POA: Diagnosis not present

## 2020-08-16 DIAGNOSIS — R200 Anesthesia of skin: Secondary | ICD-10-CM | POA: Diagnosis not present

## 2020-08-16 DIAGNOSIS — R600 Localized edema: Secondary | ICD-10-CM | POA: Diagnosis not present

## 2020-08-16 DIAGNOSIS — M25562 Pain in left knee: Secondary | ICD-10-CM | POA: Diagnosis not present

## 2020-08-16 DIAGNOSIS — R52 Pain, unspecified: Secondary | ICD-10-CM

## 2020-08-16 DIAGNOSIS — M869 Osteomyelitis, unspecified: Secondary | ICD-10-CM | POA: Diagnosis not present

## 2020-08-16 DIAGNOSIS — M7989 Other specified soft tissue disorders: Secondary | ICD-10-CM

## 2020-08-16 DIAGNOSIS — S8992XA Unspecified injury of left lower leg, initial encounter: Secondary | ICD-10-CM | POA: Diagnosis present

## 2020-08-16 DIAGNOSIS — S8002XA Contusion of left knee, initial encounter: Secondary | ICD-10-CM

## 2020-08-16 DIAGNOSIS — S80212A Abrasion, left knee, initial encounter: Secondary | ICD-10-CM | POA: Diagnosis not present

## 2020-08-16 LAB — CBC WITH DIFFERENTIAL/PLATELET
Abs Immature Granulocytes: 0.03 10*3/uL (ref 0.00–0.07)
Basophils Absolute: 0 10*3/uL (ref 0.0–0.1)
Basophils Relative: 1 %
Eosinophils Absolute: 0.1 10*3/uL (ref 0.0–0.5)
Eosinophils Relative: 2 %
HCT: 44.5 % (ref 39.0–52.0)
Hemoglobin: 14 g/dL (ref 13.0–17.0)
Immature Granulocytes: 1 %
Lymphocytes Relative: 20 %
Lymphs Abs: 1.1 10*3/uL (ref 0.7–4.0)
MCH: 28.5 pg (ref 26.0–34.0)
MCHC: 31.5 g/dL (ref 30.0–36.0)
MCV: 90.4 fL (ref 80.0–100.0)
Monocytes Absolute: 0.5 10*3/uL (ref 0.1–1.0)
Monocytes Relative: 9 %
Neutro Abs: 3.8 10*3/uL (ref 1.7–7.7)
Neutrophils Relative %: 67 %
Platelets: 268 10*3/uL (ref 150–400)
RBC: 4.92 MIL/uL (ref 4.22–5.81)
RDW: 14.7 % (ref 11.5–15.5)
WBC: 5.6 10*3/uL (ref 4.0–10.5)
nRBC: 0 % (ref 0.0–0.2)

## 2020-08-16 LAB — BASIC METABOLIC PANEL
Anion gap: 10 (ref 5–15)
BUN: 17 mg/dL (ref 6–20)
CO2: 26 mmol/L (ref 22–32)
Calcium: 9 mg/dL (ref 8.9–10.3)
Chloride: 104 mmol/L (ref 98–111)
Creatinine, Ser: 0.93 mg/dL (ref 0.61–1.24)
GFR, Estimated: >60 mL/min (ref >60)
Glucose, Bld: 105 mg/dL — ABNORMAL HIGH (ref 70–99)
Potassium: 3.8 mmol/L (ref 3.5–5.1)
Sodium: 140 mmol/L (ref 135–145)

## 2020-08-16 LAB — C-REACTIVE PROTEIN: CRP: 1.4 mg/dL — ABNORMAL HIGH (ref <1.0)

## 2020-08-16 MED ORDER — HYDROMORPHONE HCL 1 MG/ML IJ SOLN
1.0000 mg | INTRAMUSCULAR | Status: AC | PRN
  Administered 2020-08-17 (×2): 1 mg via INTRAVENOUS
  Filled 2020-08-16 (×2): qty 1

## 2020-08-16 MED ORDER — DIPHENHYDRAMINE HCL 50 MG/ML IJ SOLN
25.0000 mg | Freq: Once | INTRAMUSCULAR | Status: DC
  Filled 2020-08-16: qty 1

## 2020-08-16 MED ORDER — HYDROMORPHONE HCL 1 MG/ML IJ SOLN
1.0000 mg | Freq: Once | INTRAMUSCULAR | Status: AC
  Administered 2020-08-17: 1 mg via INTRAVENOUS
  Filled 2020-08-16: qty 1

## 2020-08-16 MED ORDER — OXYCODONE HCL 5 MG PO TABS
30.0000 mg | ORAL_TABLET | Freq: Four times a day (QID) | ORAL | Status: DC | PRN
  Administered 2020-08-17 (×2): 30 mg via ORAL
  Filled 2020-08-16 (×2): qty 6

## 2020-08-16 MED ORDER — HYDROMORPHONE HCL 1 MG/ML IJ SOLN
1.0000 mg | Freq: Once | INTRAMUSCULAR | Status: AC
  Administered 2020-08-16: 1 mg via INTRAVENOUS
  Filled 2020-08-16: qty 1
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
## 2020-08-16 NOTE — ED Notes (Signed)
  Attempted to start PIV on patient x2 and had no success.  IV team paged for consult.
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
## 2020-08-16 NOTE — Telephone Encounter (Signed)
Patient returning a call to Autumn F. Please call pt at [phone].

## 2020-08-16 NOTE — ED Notes (Signed)
  Patient come back from CT and has swollen area above PIV on L arm.  Area is warm, red, and soft.  PIV still flushes and pulls back blood.  Patient expresses no pain when flushing PIV.  Dr Maryan Rued notified and medications ordered.

## 2020-08-16 NOTE — Telephone Encounter (Signed)
I called [place] transportation services and pt is with in the radius tha they service. I called and lm on vm for the pt to make an appt for either this afternoon or tomorrow he is to call back and ask for me directly so that I can sch appt and then notify transpiration of the appt date and time. Will hold this message pending return call.

## 2020-08-16 NOTE — Progress Notes (Signed)
Left leg lower ext venous  has been completed. Refer to [facility] under chart review to view preliminary results.   08/16/2020  5:07 PM Jason [name], Bonnye Fava

## 2020-08-16 NOTE — Telephone Encounter (Signed)
-----   Message from Pamella Pert, [place] sent at 08/16/2020  9:47 AM EDT ----- Arrange transportation and call pt to make an appt tomorrow?

## 2020-08-16 NOTE — Telephone Encounter (Signed)
This has been done.

## 2020-08-16 NOTE — Telephone Encounter (Signed)
Pt has an appt today at 3 pm called and sw Ben with transportation and they can pick up the pt.

## 2020-08-17 DIAGNOSIS — A409 Streptococcal sepsis, unspecified: Secondary | ICD-10-CM | POA: Diagnosis not present

## 2020-08-17 DIAGNOSIS — A419 Sepsis, unspecified organism: Secondary | ICD-10-CM | POA: Diagnosis not present

## 2020-08-17 LAB — SEDIMENTATION RATE: Sed Rate: 4 mm/hr (ref 0–16)

## 2020-08-17 MED ORDER — IBUPROFEN 800 MG PO TABS
800.0000 mg | ORAL_TABLET | Freq: Once | ORAL | Status: AC
  Administered 2020-08-17: 800 mg via ORAL
  Filled 2020-08-17: qty 1

## 2020-08-17 NOTE — Discharge Planning (Signed)
Jason [name] J. Clydene Laming, RN, BSN, [place] [phone] RNCM spoke with pt at bedside regarding discharge planning for [facility]. Offered pt medicare.gov list of home health agencies to choose from.  Pt chose [facility] to render services. Jason [name] of [facility] notified. Patient made aware that [facility] will be in contact in 24-48 hours.  No DME needs identified at this time.

## 2020-08-17 NOTE — ED Notes (Signed)
Patient verbalizes understanding of discharge instructions. Opportunity for questioning and answers were provided. Pt discharged from ED. 

## 2020-08-20 ENCOUNTER — Emergency Department (HOSPITAL_COMMUNITY): Payer: Medicare Other

## 2020-08-20 ENCOUNTER — Inpatient Hospital Stay (HOSPITAL_COMMUNITY)
Admission: EM | Admit: 2020-08-20 | Discharge: 2020-08-24 | DRG: 871 | Disposition: A | Discharge status: Home Health | Payer: Medicare Other | Attending: Internal Medicine | Admitting: Internal Medicine

## 2020-08-20 ENCOUNTER — Inpatient Hospital Stay (HOSPITAL_COMMUNITY): Payer: Medicare Other

## 2020-08-20 ENCOUNTER — Encounter (HOSPITAL_COMMUNITY): Payer: Self-pay | Admitting: Family Medicine

## 2020-08-20 DIAGNOSIS — Z20822 Contact with and (suspected) exposure to covid-19: Secondary | ICD-10-CM | POA: Diagnosis present

## 2020-08-20 DIAGNOSIS — A409 Streptococcal sepsis, unspecified: Principal | ICD-10-CM | POA: Diagnosis present

## 2020-08-20 DIAGNOSIS — M24662 Ankylosis, left knee: Secondary | ICD-10-CM | POA: Diagnosis present

## 2020-08-20 DIAGNOSIS — A408 Other streptococcal sepsis: Secondary | ICD-10-CM | POA: Diagnosis not present

## 2020-08-20 DIAGNOSIS — Z993 Dependence on wheelchair: Secondary | ICD-10-CM

## 2020-08-20 DIAGNOSIS — E872 Acidosis: Secondary | ICD-10-CM | POA: Diagnosis present

## 2020-08-20 DIAGNOSIS — R7881 Bacteremia: Secondary | ICD-10-CM | POA: Diagnosis not present

## 2020-08-20 DIAGNOSIS — Z885 Allergy status to narcotic agent status: Secondary | ICD-10-CM | POA: Diagnosis not present

## 2020-08-20 DIAGNOSIS — Z89611 Acquired absence of right leg above knee: Secondary | ICD-10-CM | POA: Diagnosis not present

## 2020-08-20 DIAGNOSIS — M25569 Pain in unspecified knee: Secondary | ICD-10-CM

## 2020-08-20 DIAGNOSIS — N179 Acute kidney failure, unspecified: Secondary | ICD-10-CM | POA: Diagnosis present

## 2020-08-20 DIAGNOSIS — M1712 Unilateral primary osteoarthritis, left knee: Secondary | ICD-10-CM | POA: Diagnosis present

## 2020-08-20 DIAGNOSIS — I959 Hypotension, unspecified: Secondary | ICD-10-CM | POA: Diagnosis present

## 2020-08-20 DIAGNOSIS — Z825 Family history of asthma and other chronic lower respiratory diseases: Secondary | ICD-10-CM

## 2020-08-20 DIAGNOSIS — Z91041 Radiographic dye allergy status: Secondary | ICD-10-CM | POA: Diagnosis not present

## 2020-08-20 DIAGNOSIS — R6520 Severe sepsis without septic shock: Secondary | ICD-10-CM | POA: Diagnosis present

## 2020-08-20 DIAGNOSIS — M25462 Effusion, left knee: Secondary | ICD-10-CM | POA: Diagnosis present

## 2020-08-20 DIAGNOSIS — R509 Fever, unspecified: Secondary | ICD-10-CM

## 2020-08-20 DIAGNOSIS — R000 Tachycardia, unspecified: Secondary | ICD-10-CM | POA: Diagnosis present

## 2020-08-20 DIAGNOSIS — I2609 Other pulmonary embolism with acute cor pulmonale: Secondary | ICD-10-CM | POA: Diagnosis not present

## 2020-08-20 DIAGNOSIS — Z79891 Long term (current) use of opiate analgesic: Secondary | ICD-10-CM

## 2020-08-20 DIAGNOSIS — I10 Essential (primary) hypertension: Secondary | ICD-10-CM | POA: Diagnosis present

## 2020-08-20 DIAGNOSIS — M25562 Pain in left knee: Secondary | ICD-10-CM | POA: Diagnosis present

## 2020-08-20 DIAGNOSIS — G894 Chronic pain syndrome: Secondary | ICD-10-CM | POA: Diagnosis present

## 2020-08-20 DIAGNOSIS — Z881 Allergy status to other antibiotic agents status: Secondary | ICD-10-CM | POA: Diagnosis not present

## 2020-08-20 DIAGNOSIS — Z79899 Other long term (current) drug therapy: Secondary | ICD-10-CM

## 2020-08-20 DIAGNOSIS — M009 Pyogenic arthritis, unspecified: Secondary | ICD-10-CM | POA: Diagnosis present

## 2020-08-20 DIAGNOSIS — M19012 Primary osteoarthritis, left shoulder: Secondary | ICD-10-CM | POA: Diagnosis present

## 2020-08-20 DIAGNOSIS — I2699 Other pulmonary embolism without acute cor pulmonale: Secondary | ICD-10-CM | POA: Diagnosis present

## 2020-08-20 DIAGNOSIS — B954 Other streptococcus as the cause of diseases classified elsewhere: Secondary | ICD-10-CM | POA: Diagnosis present

## 2020-08-20 DIAGNOSIS — Z8249 Family history of ischemic heart disease and other diseases of the circulatory system: Secondary | ICD-10-CM

## 2020-08-20 DIAGNOSIS — I445 Left posterior fascicular block: Secondary | ICD-10-CM | POA: Diagnosis present

## 2020-08-20 DIAGNOSIS — F419 Anxiety disorder, unspecified: Secondary | ICD-10-CM | POA: Diagnosis present

## 2020-08-20 DIAGNOSIS — Z91048 Other nonmedicinal substance allergy status: Secondary | ICD-10-CM

## 2020-08-20 DIAGNOSIS — G8929 Other chronic pain: Secondary | ICD-10-CM | POA: Diagnosis not present

## 2020-08-20 DIAGNOSIS — R7989 Other specified abnormal findings of blood chemistry: Secondary | ICD-10-CM | POA: Diagnosis present

## 2020-08-20 DIAGNOSIS — A419 Sepsis, unspecified organism: Secondary | ICD-10-CM

## 2020-08-20 DIAGNOSIS — Z813 Family history of other psychoactive substance abuse and dependence: Secondary | ICD-10-CM

## 2020-08-20 DIAGNOSIS — M869 Osteomyelitis, unspecified: Secondary | ICD-10-CM | POA: Diagnosis present

## 2020-08-20 DIAGNOSIS — Z7901 Long term (current) use of anticoagulants: Secondary | ICD-10-CM | POA: Diagnosis not present

## 2020-08-20 DIAGNOSIS — Z833 Family history of diabetes mellitus: Secondary | ICD-10-CM

## 2020-08-20 LAB — COMPREHENSIVE METABOLIC PANEL
ALT: 23 U/L (ref 0–44)
AST: 47 U/L — ABNORMAL HIGH (ref 15–41)
Albumin: 3.9 g/dL (ref 3.5–5.0)
Alkaline Phosphatase: 111 U/L (ref 38–126)
Anion gap: 10 (ref 5–15)
BUN: 18 mg/dL (ref 6–20)
CO2: 25 mmol/L (ref 22–32)
Calcium: 8.8 mg/dL — ABNORMAL LOW (ref 8.9–10.3)
Chloride: 105 mmol/L (ref 98–111)
Creatinine, Ser: 1.61 mg/dL — ABNORMAL HIGH (ref 0.61–1.24)
GFR, Estimated: 51 mL/min — ABNORMAL LOW (ref >60)
Glucose, Bld: 104 mg/dL — ABNORMAL HIGH (ref 70–99)
Potassium: 3.5 mmol/L (ref 3.5–5.1)
Sodium: 140 mmol/L (ref 135–145)
Total Bilirubin: 0.5 mg/dL (ref 0.3–1.2)
Total Protein: 6.9 g/dL (ref 6.5–8.1)

## 2020-08-20 LAB — CBC WITH DIFFERENTIAL/PLATELET
Abs Immature Granulocytes: 0.08 10*3/uL — ABNORMAL HIGH (ref 0.00–0.07)
Basophils Absolute: 0 10*3/uL (ref 0.0–0.1)
Basophils Relative: 0 %
Eosinophils Absolute: 0 10*3/uL (ref 0.0–0.5)
Eosinophils Relative: 0 %
HCT: 44.4 % (ref 39.0–52.0)
Hemoglobin: 14.4 g/dL (ref 13.0–17.0)
Immature Granulocytes: 1 %
Lymphocytes Relative: 3 %
Lymphs Abs: 0.3 10*3/uL — ABNORMAL LOW (ref 0.7–4.0)
MCH: 28.9 pg (ref 26.0–34.0)
MCHC: 32.4 g/dL (ref 30.0–36.0)
MCV: 89.2 fL (ref 80.0–100.0)
Monocytes Absolute: 0 10*3/uL — ABNORMAL LOW (ref 0.1–1.0)
Monocytes Relative: 0 %
Neutro Abs: 11.3 10*3/uL — ABNORMAL HIGH (ref 1.7–7.7)
Neutrophils Relative %: 96 %
Platelets: 258 10*3/uL (ref 150–400)
RBC: 4.98 MIL/uL (ref 4.22–5.81)
RDW: 14.8 % (ref 11.5–15.5)
WBC: 11.7 10*3/uL — ABNORMAL HIGH (ref 4.0–10.5)
nRBC: 0 % (ref 0.0–0.2)

## 2020-08-20 LAB — SEDIMENTATION RATE: Sed Rate: 3 mm/hr (ref 0–16)

## 2020-08-20 LAB — LACTIC ACID, PLASMA
Lactic Acid, Venous: 3.6 mmol/L (ref 0.5–1.9)
Lactic Acid, Venous: 1.6 mmol/L (ref 0.5–1.9)

## 2020-08-20 LAB — RESP PANEL BY RT-PCR (FLU A&B, COVID) ARPGX2
Influenza A by PCR: NEGATIVE
Influenza B by PCR: NEGATIVE
SARS Coronavirus 2 by RT PCR: NEGATIVE

## 2020-08-20 LAB — PROTIME-INR
INR: 1 (ref 0.8–1.2)
Prothrombin Time: 13.3 seconds (ref 11.4–15.2)

## 2020-08-20 LAB — APTT: aPTT: 21 seconds — ABNORMAL LOW (ref 24–36)

## 2020-08-20 LAB — C-REACTIVE PROTEIN: CRP: 0.6 mg/dL (ref <1.0)

## 2020-08-20 LAB — D-DIMER, QUANTITATIVE: D-Dimer, Quant: 3.61 ug/mL-FEU — ABNORMAL HIGH (ref 0.00–0.50)

## 2020-08-20 LAB — TROPONIN I (HIGH SENSITIVITY): Troponin I (High Sensitivity): 11 ng/L (ref <18)

## 2020-08-20 LAB — PROCALCITONIN: Procalcitonin: 10.48 ng/mL

## 2020-08-20 MED ORDER — SENNOSIDES-DOCUSATE SODIUM 8.6-50 MG PO TABS: 1.0000 | ORAL_TABLET | Freq: Every evening | ORAL | Status: DC | PRN

## 2020-08-20 MED ORDER — ACETAMINOPHEN 325 MG PO TABS
650.0000 mg | ORAL_TABLET | Freq: Four times a day (QID) | ORAL | Status: DC | PRN
  Administered 2020-08-21 – 2020-08-23 (×3): 650 mg via ORAL
  Filled 2020-08-20 (×3): qty 2

## 2020-08-20 MED ORDER — CLONAZEPAM 0.5 MG PO TABS
1.0000 mg | ORAL_TABLET | Freq: Two times a day (BID) | ORAL | Status: DC | PRN
  Administered 2020-08-20: 1 mg via ORAL
  Filled 2020-08-20: qty 2

## 2020-08-20 MED ORDER — HYDROMORPHONE HCL 1 MG/ML IJ SOLN
0.5000 mg | INTRAMUSCULAR | Status: DC | PRN
  Administered 2020-08-20 – 2020-08-21 (×4): 0.5 mg via INTRAVENOUS
  Filled 2020-08-20 (×4): qty 1

## 2020-08-20 MED ORDER — SODIUM CHLORIDE 0.9 % IV SOLN
3.0000 g | Freq: Three times a day (TID) | INTRAVENOUS | Status: DC
  Administered 2020-08-20 – 2020-08-21 (×4): 3 g via INTRAVENOUS
  Filled 2020-08-20: qty 3
  Filled 2020-08-20: qty 8
  Filled 2020-08-20 (×2): qty 3
  Filled 2020-08-20 (×2): qty 8

## 2020-08-20 MED ORDER — ZOLPIDEM TARTRATE 5 MG PO TABS: 10.0000 mg | ORAL_TABLET | Freq: Every day | ORAL | Status: DC

## 2020-08-20 MED ORDER — ENOXAPARIN SODIUM 40 MG/0.4ML IJ SOSY
40.0000 mg | PREFILLED_SYRINGE | Freq: Every day | INTRAMUSCULAR | Status: DC
  Administered 2020-08-20 – 2020-08-21 (×2): 40 mg via SUBCUTANEOUS
  Filled 2020-08-20 (×2): qty 0.4

## 2020-08-20 MED ORDER — HYDROMORPHONE HCL 1 MG/ML IJ SOLN
1.0000 mg | Freq: Once | INTRAMUSCULAR | Status: AC
  Administered 2020-08-20: 1 mg via INTRAVENOUS
  Filled 2020-08-20: qty 1

## 2020-08-20 MED ORDER — VANCOMYCIN HCL 750 MG/150ML IV SOLN
750.0000 mg | Freq: Two times a day (BID) | INTRAVENOUS | Status: DC
  Administered 2020-08-20 – 2020-08-21 (×2): 750 mg via INTRAVENOUS
  Filled 2020-08-20 (×3): qty 150

## 2020-08-20 MED ORDER — LACTATED RINGERS IV SOLN: INTRAVENOUS | Status: AC

## 2020-08-20 MED ORDER — VANCOMYCIN HCL 1500 MG/300ML IV SOLN
1500.0000 mg | Freq: Once | INTRAVENOUS | Status: AC
  Administered 2020-08-20: 1500 mg via INTRAVENOUS
  Filled 2020-08-20: qty 300

## 2020-08-20 MED ORDER — HYDROMORPHONE HCL 1 MG/ML IJ SOLN
0.5000 mg | Freq: Once | INTRAMUSCULAR | Status: AC
  Administered 2020-08-20: 0.5 mg via INTRAVENOUS
  Filled 2020-08-20: qty 1

## 2020-08-20 MED ORDER — ONDANSETRON HCL 4 MG/2ML IJ SOLN: 4.0000 mg | Freq: Four times a day (QID) | INTRAMUSCULAR | Status: DC | PRN

## 2020-08-20 MED ORDER — LACTATED RINGERS IV BOLUS (SEPSIS)
500.0000 mL | Freq: Once | INTRAVENOUS | Status: AC
  Administered 2020-08-20: 500 mL via INTRAVENOUS

## 2020-08-20 MED ORDER — FENTANYL CITRATE (PF) 100 MCG/2ML IJ SOLN
50.0000 ug | Freq: Once | INTRAMUSCULAR | Status: AC
  Administered 2020-08-20: 50 ug via INTRAVENOUS
  Filled 2020-08-20: qty 2

## 2020-08-20 MED ORDER — HYDROMORPHONE HCL 1 MG/ML IJ SOLN
1.0000 mg | Freq: Once | INTRAMUSCULAR | Status: AC
Start: 2020-08-20 — End: 2020-08-20
  Administered 2020-08-20: 1 mg via INTRAVENOUS
  Filled 2020-08-20: qty 1

## 2020-08-20 MED ORDER — OXYCODONE HCL 5 MG PO TABS
30.0000 mg | ORAL_TABLET | Freq: Four times a day (QID) | ORAL | Status: DC | PRN
  Administered 2020-08-20 – 2020-08-23 (×7): 30 mg via ORAL
  Filled 2020-08-20 (×7): qty 6

## 2020-08-20 MED ORDER — LACTATED RINGERS IV BOLUS
1000.0000 mL | Freq: Once | INTRAVENOUS | Status: AC
  Administered 2020-08-20: 1000 mL via INTRAVENOUS

## 2020-08-20 MED ORDER — HYDROMORPHONE HCL 1 MG/ML IJ SOLN: 0.5000 mg | INTRAMUSCULAR | Status: DC | PRN

## 2020-08-20 MED ORDER — LACTATED RINGERS IV BOLUS (SEPSIS)
1000.0000 mL | Freq: Once | INTRAVENOUS | Status: AC
  Administered 2020-08-20: 1000 mL via INTRAVENOUS

## 2020-08-20 MED ORDER — LACTATED RINGERS IV BOLUS (SEPSIS)
1000.0000 mL | Freq: Once | INTRAVENOUS | Status: DC
  Administered 2020-08-20: 1000 mL via INTRAVENOUS

## 2020-08-20 MED ORDER — ONDANSETRON HCL 4 MG PO TABS: 4.0000 mg | ORAL_TABLET | Freq: Four times a day (QID) | ORAL | Status: DC | PRN

## 2020-08-20 MED ORDER — HYDROMORPHONE HCL 1 MG/ML IJ SOLN
1.0000 mg | INTRAMUSCULAR | Status: DC | PRN
  Administered 2020-08-20 (×3): 1 mg via INTRAVENOUS
  Filled 2020-08-20 (×4): qty 1

## 2020-08-20 MED ORDER — ACETAMINOPHEN 650 MG RE SUPP: 650.0000 mg | Freq: Four times a day (QID) | RECTAL | Status: DC | PRN

## 2020-08-20 MED ORDER — SODIUM CHLORIDE 0.9% FLUSH
10.0000 mL | Freq: Two times a day (BID) | INTRAVENOUS | Status: DC
  Administered 2020-08-20 – 2020-08-23 (×5): 10 mL

## 2020-08-20 MED ORDER — HYDROMORPHONE HCL 1 MG/ML IJ SOLN: 1.0000 mg | INTRAMUSCULAR | Status: DC | PRN

## 2020-08-20 MED ORDER — SODIUM CHLORIDE 0.9% FLUSH
10.0000 mL | INTRAVENOUS | Status: DC | PRN
  Administered 2020-08-21: 10 mL

## 2020-08-20 NOTE — Progress Notes (Signed)
Pt arrived from [facility]. VSS, CHG bath, CCMD called and verified. Pt complains of left knee pain. ED RN verbalized issues with right upper quadrant pain. Discussed with patient who states he has previous history of DVT.  Pt states discomfort with inhalation.  Dr. Avon Gully made aware. Pt upset that dilaudid dose was decreased and wanted Dr called.  Pt aware that Dr. Sharol Given needs to see him and home pain medication will be resumed in addition to lower dose IV dilaudid. Pt resting with call bell within reach.  Will continue to monitor.

## 2020-08-20 NOTE — ED Notes (Signed)
Notified Dr Avon Gully of patient c/o right upper quad abdominal pain, "soreness", 8/10 pain.

## 2020-08-20 NOTE — Progress Notes (Signed)
[facility] following for Sepsis Protocol 

## 2020-08-20 NOTE — ED Notes (Signed)
Provider at bedside
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
## 2020-08-20 NOTE — Progress Notes (Signed)
Pharmacy Antibiotic Note  Jason [name] is a 55 y.o. male admitted on 08/20/2020 with sepsis,?infected left knee.  Pharmacy has been consulted for Vancomycin/Unasyn dosing. WBC is elevated. Acute renal failure.   Plan: Vancomycin 1500 mg IV x 1, then 750 mg IV q12h, increase dose when Scr improves >>Estimated AUC: 506 Unasyn 3g IV q8h Trend WBC, temp, renal function  F/U infectious work-up Drug levels as indicated   Height: 5\' 10"  (177.8 cm) Weight: 81.6 kg (180 lb) IBW/kg (Calculated) : 73  Temp (24hrs), Avg:100.9 F (38.3 C), Min:99.5 F (37.5 C), Max:102.5 F (39.2 C)  Recent Labs  Lab 08/16/20 1613 08/20/20 0215  WBC 5.6 11.7*  CREATININE 0.93 1.61*  LATICACIDVEN  --  3.6*    Estimated Creatinine Clearance: 55.4 mL/min (A) (by C-G formula based on SCr of 1.61 mg/dL (H)).    Allergies  Allergen Reactions  . Ivp Dye [Iodinated Diagnostic Agents] Anaphylaxis    Can be pre-treated with Benadryl  . Metrizamide Anaphylaxis  . Other Other (See Comments)    Under no circumstances will the patient agree to a PICC line  . Reglan [Metoclopramide] Anaphylaxis  . Shellfish-Derived Products Anaphylaxis  . Codeine Hives  . Lidocaine Other (See Comments) and Rash    Pt not sure if this is an actual allergy - might have been a one time incident  . Methadone Hives  . Morphine Swelling and Rash    Local reaction to IV being pushed too fast  . Propoxyphene Hives  . Toradol [Ketorolac Tromethamine] Itching    Patient having systemic itching after receiving IV dose  . Iodine Rash    Reports localized reaction at IV site. Able to tolerate with Benadryl.   . Tape Rash  . Vancomycin Rash    Has had vancomycin since this reaction and had no reaction at all   Narda Bonds, PharmD, [place] Pharmacist Phone: [phone]

## 2020-08-20 NOTE — ED Notes (Signed)
Pt is a difficult stick cannot obtain cultures and blood work or give antibiotics at this time. IV team consult ordered

## 2020-08-20 NOTE — Progress Notes (Signed)
Pt complaining of increased pain in right upper quadrant.  States this pain is worse than knee pain. Dr. Avon Gully aware and ordered D dimer. Placed oxygen at 2 liters to help with breathing. Saturations 95-98 % on room air.  Encouraged pt to relax and give medication time to work. Pt resting with call bell within reach.  Will continue to monitor.

## 2020-08-20 NOTE — ED Triage Notes (Signed)
Pt arrived via [facility] due to hypotension. Ems reports pts bp was 80/60, ems gave pt a l nacl, pts bp went up to 112/70. EMS reports a possible infection in pts left knee.Pt had knee surgery on the 13th. Pt reports having a fever of 102 and taking tylenol. Pt reports a 10/10 pain in his knee.Pt is axox4.

## 2020-08-20 NOTE — ED Notes (Signed)
PA Petrucelli notified of pts lactic of 3.6
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
## 2020-08-20 NOTE — Progress Notes (Signed)
Communications between Jason [name] and bedside RN have been taking place, pt is a very difficult stick requiring placement of Midline: labs, fluids, Abx's have been given and or collected based on  difficult access

## 2020-08-20 NOTE — ED Notes (Signed)
Unable to obtain lactic because pt hard stick and medication is going in iv

## 2020-08-20 NOTE — ED Notes (Signed)
Midline kinked,rn removed line.Medicaions paused

## 2020-08-21 ENCOUNTER — Inpatient Hospital Stay (HOSPITAL_COMMUNITY): Payer: Medicare Other

## 2020-08-21 DIAGNOSIS — R7989 Other specified abnormal findings of blood chemistry: Secondary | ICD-10-CM

## 2020-08-21 DIAGNOSIS — M25569 Pain in unspecified knee: Secondary | ICD-10-CM | POA: Diagnosis present

## 2020-08-21 LAB — BLOOD CULTURE ID PANEL (REFLEXED) - BCID2

## 2020-08-21 LAB — CBC
HCT: 36.2 % — ABNORMAL LOW (ref 39.0–52.0)
Hemoglobin: 11.8 g/dL — ABNORMAL LOW (ref 13.0–17.0)
MCH: 29.3 pg (ref 26.0–34.0)
MCHC: 32.6 g/dL (ref 30.0–36.0)
MCV: 89.8 fL (ref 80.0–100.0)
Platelets: 161 10*3/uL (ref 150–400)
RBC: 4.03 MIL/uL — ABNORMAL LOW (ref 4.22–5.81)
RDW: 15.3 % (ref 11.5–15.5)
WBC: 13.8 10*3/uL — ABNORMAL HIGH (ref 4.0–10.5)
nRBC: 0 % (ref 0.0–0.2)

## 2020-08-21 LAB — COMPREHENSIVE METABOLIC PANEL
ALT: 23 U/L (ref 0–44)
AST: 28 U/L (ref 15–41)
Albumin: 3 g/dL — ABNORMAL LOW (ref 3.5–5.0)
Alkaline Phosphatase: 93 U/L (ref 38–126)
Anion gap: 8 (ref 5–15)
BUN: 13 mg/dL (ref 6–20)
CO2: 26 mmol/L (ref 22–32)
Calcium: 8.1 mg/dL — ABNORMAL LOW (ref 8.9–10.3)
Chloride: 105 mmol/L (ref 98–111)
Creatinine, Ser: 1.16 mg/dL (ref 0.61–1.24)
GFR, Estimated: >60 mL/min (ref >60)
Glucose, Bld: 137 mg/dL — ABNORMAL HIGH (ref 70–99)
Potassium: 3.1 mmol/L — ABNORMAL LOW (ref 3.5–5.1)
Sodium: 139 mmol/L (ref 135–145)
Total Bilirubin: 0.9 mg/dL (ref 0.3–1.2)
Total Protein: 5.8 g/dL — ABNORMAL LOW (ref 6.5–8.1)

## 2020-08-21 MED ORDER — ENOXAPARIN SODIUM 40 MG/0.4ML IJ SOSY
40.0000 mg | PREFILLED_SYRINGE | Freq: Once | INTRAMUSCULAR | Status: AC
  Administered 2020-08-21: 40 mg via SUBCUTANEOUS
  Filled 2020-08-21: qty 0.4

## 2020-08-21 MED ORDER — HYDROMORPHONE HCL 1 MG/ML IJ SOLN
1.0000 mg | INTRAMUSCULAR | Status: DC | PRN
  Administered 2020-08-21 – 2020-08-22 (×10): 1 mg via INTRAVENOUS
  Filled 2020-08-21 (×10): qty 1

## 2020-08-21 MED ORDER — PREDNISONE 20 MG PO TABS
50.0000 mg | ORAL_TABLET | Freq: Four times a day (QID) | ORAL | Status: AC
  Administered 2020-08-21 – 2020-08-22 (×2): 50 mg via ORAL
  Filled 2020-08-21 (×3): qty 2

## 2020-08-21 MED ORDER — DIPHENHYDRAMINE HCL 25 MG PO CAPS
25.0000 mg | ORAL_CAPSULE | Freq: Four times a day (QID) | ORAL | Status: DC | PRN
  Administered 2020-08-21: 25 mg via ORAL
  Filled 2020-08-21: qty 1

## 2020-08-21 MED ORDER — SODIUM CHLORIDE 0.9 % IV SOLN
3.0000 g | Freq: Four times a day (QID) | INTRAVENOUS | Status: DC
  Administered 2020-08-21 – 2020-08-24 (×13): 3 g via INTRAVENOUS
  Filled 2020-08-21: qty 3
  Filled 2020-08-21: qty 8
  Filled 2020-08-21: qty 3
  Filled 2020-08-21: qty 8
  Filled 2020-08-21: qty 3
  Filled 2020-08-21: qty 8
  Filled 2020-08-21 (×2): qty 3
  Filled 2020-08-21: qty 8
  Filled 2020-08-21: qty 3
  Filled 2020-08-21: qty 8
  Filled 2020-08-21 (×5): qty 3

## 2020-08-21 MED ORDER — DIPHENHYDRAMINE HCL 25 MG PO CAPS
50.0000 mg | ORAL_CAPSULE | Freq: Once | ORAL | Status: AC
  Administered 2020-08-22: 50 mg via ORAL
  Filled 2020-08-21: qty 2

## 2020-08-21 MED ORDER — DIPHENHYDRAMINE HCL 50 MG/ML IJ SOLN
50.0000 mg | Freq: Once | INTRAMUSCULAR | Status: AC
  Filled 2020-08-21: qty 1

## 2020-08-21 MED ORDER — VANCOMYCIN HCL 1000 MG/200ML IV SOLN
1000.0000 mg | Freq: Two times a day (BID) | INTRAVENOUS | Status: DC
  Filled 2020-08-21: qty 200

## 2020-08-21 MED ORDER — HYDROCORTISONE NA SUCCINATE PF 250 MG IJ SOLR
200.0000 mg | Freq: Once | INTRAMUSCULAR | Status: AC
  Administered 2020-08-21: 200 mg via INTRAVENOUS
  Filled 2020-08-21 (×2): qty 200

## 2020-08-21 MED ORDER — ENOXAPARIN SODIUM 80 MG/0.8ML IJ SOSY
80.0000 mg | PREFILLED_SYRINGE | Freq: Two times a day (BID) | INTRAMUSCULAR | Status: DC
  Administered 2020-08-21: 80 mg via SUBCUTANEOUS
  Filled 2020-08-21: qty 0.8

## 2020-08-21 MED ORDER — DIPHENHYDRAMINE HCL 50 MG/ML IJ SOLN
50.0000 mg | Freq: Once | INTRAMUSCULAR | Status: AC
  Administered 2020-08-21: 50 mg via INTRAVENOUS
  Filled 2020-08-21: qty 1

## 2020-08-21 MED ORDER — DIPHENHYDRAMINE HCL 25 MG PO CAPS: 50.0000 mg | ORAL_CAPSULE | Freq: Once | ORAL | Status: AC
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
## 2020-08-21 NOTE — Progress Notes (Signed)
   08/21/20 1005  Clinical Encounter Type  Visited With Patient  Visit Type Spiritual support  Referral From Nurse  Consult/Referral To Chaplain  Spiritual Encounters  Spiritual Needs Prayer  Jason [name] was scheduled to have an MRI, he expressed he was scared, the pain was serious and brought tears to his eyes.  He asked if we could have prayer.  We had a beautiful moment in prayer, and he was very appreciative.  Chaplain Dresean Beckel Morgan-Simpson  [phone]

## 2020-08-21 NOTE — Progress Notes (Signed)
Lower extremity venous has been completed.   Preliminary results in CV Proc.   Abram Sander 08/21/2020 1:39 PM

## 2020-08-21 NOTE — Progress Notes (Addendum)
Pharmacy Antibiotic Note  Jason [name] is a 55 y.o. male admitted on 08/20/2020 with sepsis,?infected left knee.  Pharmacy has been consulted for Vancomycin/Unasyn dosing. WBC is elevated. Acute renal failure.   Pt with suspected knee infection and now with positive strep species on BCID 2/2 bottles. Ortho is on board. Messaged MD to consider optimize abx to just unasyn or change to ceftriaxone. In the meantime, we will adjust vanc for now until hear back.   Addendum  Ok to [employer] and cont unasyn for now per Dr. Avon Gully  Plan:   Unasyn 3g IV q6h Trend WBC, temp, renal function  F/U infectious work-up   Height: 5\' 10"  (177.8 cm) Weight: 81.6 kg (180 lb) IBW/kg (Calculated) : 73  Temp (24hrs), Avg:99.7 F (37.6 C), Min:98.1 F (36.7 C), Max:101.4 F (38.6 C)  Recent Labs  Lab 08/16/20 1613 08/20/20 0215 08/20/20 0614 08/21/20 0256  WBC 5.6 11.7*  --  13.8*  CREATININE 0.93 1.61*  --  1.16  LATICACIDVEN  --  3.6* 1.6  --     Estimated Creatinine Clearance: 76.9 mL/min (by C-G formula based on SCr of 1.16 mg/dL).    Allergies  Allergen Reactions  . Ivp Dye [Iodinated Diagnostic Agents] Anaphylaxis    Can be pre-treated with Benadryl  . Metrizamide Anaphylaxis  . Other Other (See Comments)    Under no circumstances will the patient agree to a PICC line  . Reglan [Metoclopramide] Anaphylaxis  . Shellfish-Derived Products Anaphylaxis  . Codeine Hives  . Lidocaine Other (See Comments) and Rash    Pt not sure if this is an actual allergy - might have been a one time incident  . Methadone Hives  . Morphine Swelling and Rash    Local reaction to IV being pushed too fast  . Propoxyphene Hives  . Toradol [Ketorolac Tromethamine] Itching    Patient having systemic itching after receiving IV dose  . Iodine Rash    Reports localized reaction at IV site. Able to tolerate with Benadryl.   . Tape Rash  . Vancomycin Rash    Has had vancomycin since this reaction and had no  reaction at all   6/6 vanc>>6/7 6/6 unasyn>>  6/6 blood>>GPC 2/2 bottles  Onnie Boer, PharmD, BCIDP, AAHIVP, CPP Infectious Disease Pharmacist 08/21/2020 9:50 AM
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
## 2020-08-21 NOTE — Progress Notes (Signed)
PHARMACY - PHYSICIAN COMMUNICATION CRITICAL VALUE ALERT - BLOOD CULTURE IDENTIFICATION (BCID)  Jason [name] is an 55 y.o. male who presented to [facility] on 08/20/2020 with a chief complaint of sepsis/?infected knee  Assessment: WBC mildly elevated, pro-calcitonin elevated, strep species likely true infection  Name of physician (or Provider) Contacted: Dr. Cyd Silence  Current antibiotics: Vancomycin/Unasyn  Changes to prescribed antibiotics recommended:  Cont current anti-biotics  Consider de-escalation once ortho evaluates  Results for orders placed or performed during the hospital encounter of 08/20/20  Blood Culture ID Panel (Reflexed) (Collected: 08/20/2020  2:15 AM)  Result Value Ref Range   Enterococcus faecalis NOT DETECTED NOT DETECTED   Enterococcus Faecium NOT DETECTED NOT DETECTED   Listeria monocytogenes NOT DETECTED NOT DETECTED   Staphylococcus species NOT DETECTED NOT DETECTED   Staphylococcus aureus (BCID) NOT DETECTED NOT DETECTED   Staphylococcus epidermidis NOT DETECTED NOT DETECTED   Staphylococcus lugdunensis NOT DETECTED NOT DETECTED   Streptococcus species DETECTED (A) NOT DETECTED   Streptococcus agalactiae NOT DETECTED NOT DETECTED   Streptococcus pneumoniae NOT DETECTED NOT DETECTED   Streptococcus pyogenes NOT DETECTED NOT DETECTED   A.calcoaceticus-baumannii NOT DETECTED NOT DETECTED   Bacteroides fragilis NOT DETECTED NOT DETECTED   Enterobacterales NOT DETECTED NOT DETECTED   Enterobacter cloacae complex NOT DETECTED NOT DETECTED   Escherichia coli NOT DETECTED NOT DETECTED   Klebsiella aerogenes NOT DETECTED NOT DETECTED   Klebsiella oxytoca NOT DETECTED NOT DETECTED   Klebsiella pneumoniae NOT DETECTED NOT DETECTED   Proteus species NOT DETECTED NOT DETECTED   Salmonella species NOT DETECTED NOT DETECTED   Serratia marcescens NOT DETECTED NOT DETECTED   Haemophilus influenzae NOT DETECTED NOT DETECTED   Neisseria meningitidis NOT DETECTED NOT  DETECTED   Pseudomonas aeruginosa NOT DETECTED NOT DETECTED   Stenotrophomonas maltophilia NOT DETECTED NOT DETECTED   Candida albicans NOT DETECTED NOT DETECTED   Candida auris NOT DETECTED NOT DETECTED   Candida glabrata NOT DETECTED NOT DETECTED   Candida krusei NOT DETECTED NOT DETECTED   Candida parapsilosis NOT DETECTED NOT DETECTED   Candida tropicalis NOT DETECTED NOT DETECTED   Cryptococcus neoformans/gattii NOT DETECTED NOT DETECTED    Narda Bonds 08/21/2020  2:47 AM

## 2020-08-22 ENCOUNTER — Inpatient Hospital Stay (HOSPITAL_COMMUNITY): Payer: Medicare Other

## 2020-08-22 ENCOUNTER — Other Ambulatory Visit (HOSPITAL_COMMUNITY): Payer: Self-pay

## 2020-08-22 ENCOUNTER — Telehealth: Payer: Self-pay | Admitting: Orthopedic Surgery

## 2020-08-22 DIAGNOSIS — R7881 Bacteremia: Secondary | ICD-10-CM

## 2020-08-22 LAB — CBC
HCT: 38.4 % — ABNORMAL LOW (ref 39.0–52.0)
Hemoglobin: 12.5 g/dL — ABNORMAL LOW (ref 13.0–17.0)
MCH: 29.3 pg (ref 26.0–34.0)
MCHC: 32.6 g/dL (ref 30.0–36.0)
MCV: 90.1 fL (ref 80.0–100.0)
Platelets: 150 10*3/uL (ref 150–400)
RBC: 4.26 MIL/uL (ref 4.22–5.81)
RDW: 14.8 % (ref 11.5–15.5)
WBC: 17.9 10*3/uL — ABNORMAL HIGH (ref 4.0–10.5)
nRBC: 0 % (ref 0.0–0.2)

## 2020-08-22 LAB — BASIC METABOLIC PANEL
Anion gap: 13 (ref 5–15)
BUN: 11 mg/dL (ref 6–20)
CO2: 21 mmol/L — ABNORMAL LOW (ref 22–32)
Calcium: 8.9 mg/dL (ref 8.9–10.3)
Chloride: 102 mmol/L (ref 98–111)
Creatinine, Ser: 0.94 mg/dL (ref 0.61–1.24)
GFR, Estimated: >60 mL/min (ref >60)
Glucose, Bld: 193 mg/dL — ABNORMAL HIGH (ref 70–99)
Potassium: 3.9 mmol/L (ref 3.5–5.1)
Sodium: 136 mmol/L (ref 135–145)

## 2020-08-22 LAB — ECHOCARDIOGRAM COMPLETE
AR max vel: 2.73 cm2
AV Area VTI: 3.05 cm2
AV Area mean vel: 2.88 cm2
AV Mean grad: 2 mmHg
AV Peak grad: 4.8 mmHg
Ao pk vel: 1.09 m/s
Area-P 1/2: 2.76 cm2
Height: 70 in
S' Lateral: 3 cm
Weight: 2880 oz

## 2020-08-22 MED ORDER — ONDANSETRON HCL 4 MG/2ML IJ SOLN
4.0000 mg | Freq: Once | INTRAMUSCULAR | Status: AC
  Administered 2020-08-22: 4 mg via INTRAVENOUS
  Filled 2020-08-22: qty 2

## 2020-08-22 MED ORDER — IOHEXOL 350 MG/ML SOLN
100.0000 mL | Freq: Once | INTRAVENOUS | Status: AC
  Administered 2020-08-22: 60 mL via INTRAVENOUS

## 2020-08-22 MED ORDER — DIPHENHYDRAMINE HCL 50 MG/ML IJ SOLN: 50.0000 mg | Freq: Once | INTRAMUSCULAR | Status: AC

## 2020-08-22 MED ORDER — HYDROMORPHONE HCL 1 MG/ML IJ SOLN
1.0000 mg | Freq: Once | INTRAMUSCULAR | Status: AC | PRN
  Administered 2020-08-23: 1 mg via INTRAVENOUS
  Filled 2020-08-22: qty 1

## 2020-08-22 MED ORDER — APIXABAN 5 MG PO TABS: 5.0000 mg | ORAL_TABLET | Freq: Two times a day (BID) | ORAL | Status: DC

## 2020-08-22 MED ORDER — DIPHENHYDRAMINE HCL 50 MG/ML IJ SOLN
INTRAMUSCULAR | Status: AC
  Administered 2020-08-22: 50 mg via INTRAVENOUS
  Filled 2020-08-22: qty 1

## 2020-08-22 MED ORDER — APIXABAN 5 MG PO TABS
10.0000 mg | ORAL_TABLET | Freq: Two times a day (BID) | ORAL | Status: DC
  Administered 2020-08-22 – 2020-08-24 (×5): 10 mg via ORAL
  Filled 2020-08-22 (×5): qty 2

## 2020-08-22 MED ORDER — APIXABAN 5 MG PO TABS
ORAL_TABLET | ORAL | 0 refills | Status: AC
  Filled 2020-08-22: qty 74, 30d supply, fill #0

## 2020-08-22 NOTE — Progress Notes (Addendum)
This PA was called to CT due to patient having allergic reaction to contrast.   CT team informed this PA that Mr. Clendenin had multiple CT scans with contrast in the past and had no issues, but he went though 13 hour contrast allergy preparation overnight for the PE study.   Patient underwent the PE study this morning and soon after the study, patient reported itchiness under his armpits and developed hive on his forehead.  CT called this PA to evaluate the patient.   Upon evaluation, patient appears to be tachypnic but not in acute repiratory distress, able to speak full sentence. A/O x 4.  Reports feeling hot, short of breath, nausea, and itchy throat.   1 inche irregular erythemic, non raised rash noted on right upper forehead.  No angioedema, no edema in throat, uvula in midline, no wheezing noted in all lobes.   O2 sat 92-94 % in room air, placed on 2L nasal cannula and O2 sat increased to 95-96%. O2 was increased to 3L and sat reached 98-99%.   50 mg benadryl IV given, the rash on forehead resolved but then patient started to have blurry vision on both eyes.  EOM intact, PERRLA bilaterally patient did not reported headache.   Dr. Avon Gully was paged and notified above findings.  Dr. Avon Gully states that he will evaluate patient once he is transferred back to his room.   Patient states that he is feeling slightly better.  VSS, patient to be transfer back to the floor with a RN accompanying the patent.    Armando Gang Jacklin Zwick PA-C 08/22/2020 10:51 AM

## 2020-08-22 NOTE — Progress Notes (Signed)
Patient in follow-up today for his knee.  He appears comfortable.  States he is going to go down for a study for concerns for pulmonary embolism this morning  Examination of his knee there is no effusion there is no redness no ascending cellulitis or signs of infection.  Dr. Sharol Given now has reviewed both CT scans as well as an MRI done yesterday.  Other than arthritic changes there is no evidence of infection or bony abnormalities.  From an orthopedic perspective could follow-up in our office.  A course of able to reconsult if there are any changes during his hospital stay

## 2020-08-22 NOTE — Telephone Encounter (Signed)
Patient called asked if he can continue his (PT) with [facility]. Patient said he live about 10 minutes from their office.  Patient asked if the referral can be sent prior to him getting out of the hospital? The number to contact patient is [phone]

## 2020-08-22 NOTE — Progress Notes (Signed)
Pt hives have resolved. Pt states that he still feels itchy

## 2020-08-22 NOTE — Progress Notes (Signed)
ANTICOAGULATION CONSULT NOTE - Initial Consult  Pharmacy Consult for apixaban Indication: pulmonary embolus  Allergies  Allergen Reactions  . Ivp Dye [Iodinated Diagnostic Agents] Hives    Pt developed hives,diaphoresis,redness after having 13 hr. Pre-meds., which required additional IV Benadryl while in CT.  Marland Kitchen Metrizamide Anaphylaxis  . Other Other (See Comments)    Under no circumstances will the patient agree to a PICC line  . Reglan [Metoclopramide] Anaphylaxis  . Shellfish-Derived Products Anaphylaxis  . Codeine Hives  . Lidocaine Other (See Comments) and Rash    Pt not sure if this is an actual allergy - might have been a one time incident  . Methadone Hives  . Morphine Swelling and Rash    Local reaction to IV being pushed too fast  . Propoxyphene Hives  . Toradol [Ketorolac Tromethamine] Itching    Patient having systemic itching after receiving IV dose  . Iodine Rash    Reports localized reaction at IV site. Able to tolerate with Benadryl.   . Tape Rash  . Vancomycin Rash    Has had vancomycin since this reaction and had no reaction at all    Patient Measurements: Height: 5' 10"  (177.8 cm) Weight: 81.6 kg (180 lb) IBW/kg (Calculated) : 73 Heparin Dosing Weight:   Vital Signs: Temp: 98.3 F (36.8 C) (06/08 0859) Temp Source: Oral (06/08 0859) BP: 154/95 (06/08 0859) Pulse Rate: 85 (06/08 0859)  Labs: Recent Labs    08/20/20 0215 08/20/20 2019 08/21/20 0256 08/22/20 0121  HGB 14.4  --  11.8* 12.5*  HCT 44.4  --  36.2* 38.4*  PLT 258  --  161 150  APTT 21*  --   --   --   LABPROT 13.3  --   --   --   INR 1.0  --   --   --   CREATININE 1.61*  --  1.16 0.94  TROPONINIHS  --  11  --   --     Estimated Creatinine Clearance: 94.9 mL/min (by C-G formula based on SCr of 0.94 mg/dL).   Medical History: Past Medical History:  Diagnosis Date  . Allergy   . Anxiety   . Arthritis    left shoulder  . Bronchitis   . Chest pain 07/14/2016  . Complication  of anesthesia   . DJD (degenerative joint disease) 11/29/2011  . Hypertension   . Neuromuscular disorder ([place])    carpal tunnel bilateral, ulner nerve surgery  . PONV (postoperative nausea and vomiting)   . Septic arthritis of knee, right ([place]) 11/29/2011  . Spinal headache    "long time ago"    Medications:  Medications Prior to Admission  Medication Sig Dispense Refill Last Dose  . clonazePAM (KLONOPIN) 1 MG tablet Take 1 mg by mouth 2 (two) times daily as needed for anxiety.   08/19/2020 at Unknown time  . EPINEPHrine 0.3 mg/0.3 mL IJ SOAJ injection Inject 0.3 mg into the muscle once as needed for anaphylaxis.   unk  . gabapentin (NEURONTIN) 300 MG capsule Take 300 mg by mouth 3 (three) times daily.   08/19/2020 at Unknown time  . metoprolol succinate (TOPROL-XL) 50 MG 24 hr tablet Take 50 mg by mouth in the morning and at bedtime. Take with or immediately following a meal.   08/19/2020 at 8pm  . naloxone (NARCAN) 4 MG/0.1ML LIQD nasal spray kit Place 1 spray into the nose as needed (overdose).   unk  . oxycodone (ROXICODONE) 30 MG immediate release tablet  Take 30 mg by mouth 5 (five) times daily.   08/19/2020 at Unknown time  . zolpidem (AMBIEN) 10 MG tablet Take 10 mg by mouth 2 (two) times daily as needed for sleep.   08/19/2020 at Unknown time   Scheduled:  . apixaban  10 mg Oral BID   Followed by  . [START ON 08/29/2020] apixaban  5 mg Oral BID  . predniSONE  50 mg Oral Q6H  . sodium chloride flush  10-40 mL Intracatheter Q12H    Assessment: Pt was admitted for sepsis with possible infected knee. He has been on abx for that to cover the strep species in the blood. He was started on Lovenox last night to r/o PE. CT confirmed for PE this AM. He will be transitioned to apixaban.  Goal of Therapy:   Monitor platelets by anticoagulation protocol: Yes   Plan:  Dc lovenox Apixaban 65m PO BID x7d then 568mBID Rx will follow peripherally  MiOnnie BoerPharmD, BCIDP, AAHIVP, CPP Infectious  Disease Pharmacist 08/22/2020 9:58 AM

## 2020-08-22 NOTE — Progress Notes (Signed)
This RN called to CT after pt received contrast with 13 hour prep. Pt states that he feels tingly, hives on forehead and arm pits, pt states that his throat is itchy but he is not having trouble breathing. PA Han at bedside. Orders received.

## 2020-08-22 NOTE — Progress Notes (Signed)
RN Orvil Feil arrived to transport with pt. Pt states that he is feeling diaphoretic and nauseated again. VSS. Report given to [facility].
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
## 2020-08-22 NOTE — Progress Notes (Addendum)
Pt arrived back to rm 23 from CT on 2L nasal cannula. VSS. Will continue to monitor the pt. MD notfied  Lavenia Atlas, RN

## 2020-08-22 NOTE — Progress Notes (Signed)
Pt c/o pain, this RN offers the Oxycodone patient refuses it and said it does not help. MD on call notified and she informed me that Dr. Avon Gully does not want the patient to have IV pain medication. I went back to patient room at 2107 to reassess his pain, patient was sleeping. I wrote it on the white board.

## 2020-08-22 NOTE — Progress Notes (Signed)
Pt notified the CN that his pain was not controlled, after discussion between CN and on call MD new order received. We'll continue to monitor.

## 2020-08-22 NOTE — Progress Notes (Signed)
Approximately 7:35 am, transporter came to pick the pt from CT in the middle of changing the shift.  Administered pain med per pt requested.   Lavenia Atlas, RN

## 2020-08-22 NOTE — Progress Notes (Signed)
Phone call to charge RN on 4E requesting that an RN come to transport with pt back to floor. She states that she will send someone.

## 2020-08-22 NOTE — Progress Notes (Signed)
  Echocardiogram 2D Echocardiogram has been performed.  Merrie Roof F 08/22/2020, 3:11 PM

## 2020-08-22 NOTE — Progress Notes (Signed)
Pt states that his chest is "hurting in the spot where the clots were last year." Right upper chest confirmed. Oxygen applied per PA

## 2020-08-22 NOTE — Discharge Instructions (Signed)
Information on my medicine - ELIQUIS (apixaban)  This medication education was reviewed with me or my healthcare representative as part of my discharge preparation.  The pharmacist that spoke with me during my hospital stay was:  Onnie Boer, RPH-CPP  Why was Eliquis prescribed for you? Eliquis was prescribed to treat blood clots that may have been found in the veins of your legs (deep vein thrombosis) or in your lungs (pulmonary embolism) and to reduce the risk of them occurring again.  What do You need to know about Eliquis ? The starting dose is 10 mg (two 5 mg tablets) taken TWICE daily for the FIRST SEVEN (7) DAYS, then on (enter date)  08/29/20  the dose is reduced to ONE 5 mg tablet taken TWICE daily.  Eliquis may be taken with or without food.   Try to take the dose about the same time in the morning and in the evening. If you have difficulty swallowing the tablet whole please discuss with your pharmacist how to take the medication safely.  Take Eliquis exactly as prescribed and DO NOT stop taking Eliquis without talking to the doctor who prescribed the medication.  Stopping may increase your risk of developing a new blood clot.  Refill your prescription before you run out.  After discharge, you should have regular check-up appointments with your healthcare provider that is prescribing your Eliquis.    What do you do if you miss a dose? If a dose of ELIQUIS is not taken at the scheduled time, take it as soon as possible on the same day and twice-daily administration should be resumed. The dose should not be doubled to make up for a missed dose.  Important Safety Information A possible side effect of Eliquis is bleeding. You should call your healthcare provider right away if you experience any of the following: ? Bleeding from an injury or your nose that does not stop. ? Unusual colored urine (red or dark brown) or unusual colored stools (red or black). ? Unusual bruising for  unknown reasons. ? A serious fall or if you hit your head (even if there is no bleeding).  Some medicines may interact with Eliquis and might increase your risk of bleeding or clotting while on Eliquis. To help avoid this, consult your healthcare provider or pharmacist prior to using any new prescription or non-prescription medications, including herbals, vitamins, non-steroidal anti-inflammatory drugs (NSAIDs) and supplements.  This website has more information on Eliquis (apixaban): http://www.eliquis.com/eliquis/home

## 2020-08-22 NOTE — Progress Notes (Signed)
Pt states that he has an overwhelming sense of not feeling well.

## 2020-08-22 NOTE — Progress Notes (Signed)
zofran 4mg  once iv per MD verbal order received.   Lavenia Atlas, RN

## 2020-08-23 DIAGNOSIS — G8929 Other chronic pain: Secondary | ICD-10-CM

## 2020-08-23 DIAGNOSIS — I2609 Other pulmonary embolism with acute cor pulmonale: Secondary | ICD-10-CM

## 2020-08-23 DIAGNOSIS — M25562 Pain in left knee: Secondary | ICD-10-CM

## 2020-08-23 DIAGNOSIS — R509 Fever, unspecified: Secondary | ICD-10-CM

## 2020-08-23 LAB — BASIC METABOLIC PANEL
Anion gap: 8 (ref 5–15)
BUN: 14 mg/dL (ref 6–20)
CO2: 26 mmol/L (ref 22–32)
Calcium: 9.1 mg/dL (ref 8.9–10.3)
Chloride: 108 mmol/L (ref 98–111)
Creatinine, Ser: 0.89 mg/dL (ref 0.61–1.24)
GFR, Estimated: >60 mL/min (ref >60)
Glucose, Bld: 116 mg/dL — ABNORMAL HIGH (ref 70–99)
Potassium: 3.7 mmol/L (ref 3.5–5.1)
Sodium: 142 mmol/L (ref 135–145)

## 2020-08-23 LAB — TROPONIN I (HIGH SENSITIVITY)
Troponin I (High Sensitivity): 18 ng/L — ABNORMAL HIGH (ref <18)
Troponin I (High Sensitivity): 21 ng/L — ABNORMAL HIGH (ref <18)

## 2020-08-23 LAB — CBC
HCT: 35.4 % — ABNORMAL LOW (ref 39.0–52.0)
Hemoglobin: 11.7 g/dL — ABNORMAL LOW (ref 13.0–17.0)
MCH: 28.7 pg (ref 26.0–34.0)
MCHC: 33.1 g/dL (ref 30.0–36.0)
MCV: 86.8 fL (ref 80.0–100.0)
Platelets: 231 10*3/uL (ref 150–400)
RBC: 4.08 MIL/uL — ABNORMAL LOW (ref 4.22–5.81)
RDW: 14.5 % (ref 11.5–15.5)
WBC: 17.3 10*3/uL — ABNORMAL HIGH (ref 4.0–10.5)
nRBC: 0 % (ref 0.0–0.2)

## 2020-08-23 MED ORDER — CLONAZEPAM 0.5 MG PO TABS
1.0000 mg | ORAL_TABLET | Freq: Two times a day (BID) | ORAL | Status: DC | PRN
  Administered 2020-08-23 – 2020-08-24 (×3): 1 mg via ORAL
  Filled 2020-08-23 (×3): qty 2

## 2020-08-23 MED ORDER — HYDROMORPHONE HCL 1 MG/ML IJ SOLN
1.0000 mg | INTRAMUSCULAR | Status: DC | PRN
  Administered 2020-08-23 – 2020-08-24 (×10): 1 mg via INTRAVENOUS
  Filled 2020-08-23 (×10): qty 1
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
## 2020-08-23 NOTE — TOC Benefit Eligibility Note (Signed)
Transition of Care [facility]) Benefit Eligibility Note    Patient Details  Name: Jason [name] MRN: [id number] Date of Birth: 06-26-1965   Medication/Dose: Eliquis 52m. bid for 30 day supply  Covered?: Yes  Tier: 3 Drug  Prescription Coverage Preferred Pharmacy: [facility]  Spoke with Person/Company/Phone Number:: Courtney B. W/[facility] PH# [phone] Co-Pay: $9.85  Prior Approval: No  Deductible: Met       HShelda AltesPhone Number: 08/23/2020, 10:51 AM

## 2020-08-23 NOTE — Progress Notes (Signed)
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 Furbish MD Electronically signed by Candee Furbish MD Signature Date/Time: 08/22/2020/4:21:05 PM    Final    VAS [place] LOWER EXTREMITY VENOUS (DVT)  Result Date: 08/21/2020  Lower Venous DVT Study Patient Name:  JASPER HANF  Date of Exam:   08/21/2020 Medical Rec #: [id number]       Accession #:    [id number] Date of Birth: [birth year]       Patient Gender: M Patient Age:   54Y Exam Location:  [facility] Procedure:      VAS [place] LOWER EXTREMITY VENOUS (DVT) Referring Phys: [id number] [place] --------------------------------------------------------------------------------  Indications: Elevatded ddimer.  Limitations: Rt AKA. Comparison Study: 08/16/20 prior Performing Technologist: Archie Patten RVS  Examination Guidelines: A complete evaluation includes B-mode imaging, spectral Doppler, color Doppler, and power Doppler  as needed of all accessible portions of each vessel. Bilateral testing is considered an integral part of a complete examination. Limited examinations for reoccurring indications may be performed as noted. The reflux portion of the exam is performed with the patient in reverse Trendelenburg.  +---------+---------------+---------+-----------+----------+--------------+ RIGHT    CompressibilityPhasicitySpontaneityPropertiesThrombus Aging +---------+---------------+---------+-----------+----------+--------------+ CFV      Full           Yes      Yes                                 +---------+---------------+---------+-----------+----------+--------------+ SFJ      Full                                                        +---------+---------------+---------+-----------+----------+--------------+ FV Prox  Full                                                        +---------+---------------+---------+-----------+----------+--------------+ FV Mid   Full                                                        +---------+---------------+---------+-----------+----------+--------------+ FV DistalFull                                                        +---------+---------------+---------+-----------+----------+--------------+   +---------+---------------+---------+-----------+----------+--------------+ LEFT     CompressibilityPhasicitySpontaneityPropertiesThrombus Aging +---------+---------------+---------+-----------+----------+--------------+ CFV      Full           Yes      Yes                                 +---------+---------------+---------+-----------+----------+--------------+  SFJ      Full                                                        +---------+---------------+---------+-----------+----------+--------------+ FV Prox  Full                                                         +---------+---------------+---------+-----------+----------+--------------+ FV Mid   Full                                                        +---------+---------------+---------+-----------+----------+--------------+ FV DistalFull                                                        +---------+---------------+---------+-----------+----------+--------------+ PFV      Full                                                        +---------+---------------+---------+-----------+----------+--------------+ POP      Full           Yes      Yes                                 +---------+---------------+---------+-----------+----------+--------------+ PTV      Full                                                        +---------+---------------+---------+-----------+----------+--------------+ PERO     Full                                                        +---------+---------------+---------+-----------+----------+--------------+     Summary: RIGHT: - There is no evidence of deep vein thrombosis in the lower extremity.  LEFT: - There is no evidence of deep vein thrombosis in the lower extremity.  - No cystic structure found in the popliteal fossa.  *See table(s) above for measurements and observations. Electronically signed by Ruta Hinds MD on 08/21/2020 at 4:30:16 PM.    Final         Scheduled Meds:  apixaban  10 mg Oral BID   Followed by   Derrill Memo ON 08/29/2020] apixaban  5 mg Oral BID   sodium chloride flush  10-40 mL Intracatheter Q12H   Continuous  Infusions:  ampicillin-sulbactam (UNASYN) IV 3 g (08/23/20 0930)     LOS: 3 days    Time spent: 45 minutes spent on chart review, discussion with nursing staff, consultants, updating family and interview/physical exam; more than 50% of that time was spent in counseling and/or coordination of care.    Joffre Lucks J [place] DO [facility] Available via Epic secure chat 7am-7pm After these hours,  please refer to coverage provider listed on [email or web address] 08/23/2020, 9:47 AM
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
## 2020-08-24 ENCOUNTER — Other Ambulatory Visit (HOSPITAL_COMMUNITY): Payer: Self-pay

## 2020-08-24 DIAGNOSIS — A408 Other streptococcal sepsis: Secondary | ICD-10-CM

## 2020-08-24 DIAGNOSIS — Z89611 Acquired absence of right leg above knee: Secondary | ICD-10-CM

## 2020-08-24 MED ORDER — AMOXICILLIN 500 MG PO CAPS
500.0000 mg | ORAL_CAPSULE | Freq: Three times a day (TID) | ORAL | 0 refills | Status: AC
  Filled 2020-08-24: qty 42, 14d supply, fill #0

## 2020-08-24 NOTE — Plan of Care (Signed)
  Problem: Education: Goal: Knowledge of General Education information will improve Description: Including pain rating scale, medication(s)/side effects and non-pharmacologic comfort measures Outcome: Adequate for Discharge   

## 2020-08-24 NOTE — TOC Transition Note (Signed)
Transition of Care (TOC) - CM/SW Discharge Note Marvetta Gibbons RN, BSN Transitions of Care Unit 4E- RN Case Manager See Treatment Team for direct phone #    Patient Details  Name: Jason [name] MRN: [id number] Date of Birth: 01-21-1966  Transition of Care [facility]) CM/SW Contact:  Dawayne Patricia, RN Phone Number: 08/24/2020, 4:29 PM   Clinical Narrative:    Pt stable to transition home today, HH has been arranged with [facility] as per pt request for HHPT. Pt to f/u on DME needs as insurance will not cover replacements at this time, and crutches are not available here in house or by [employer].   Notified by staff that pt was stating that he could not go home without his missing belongings and without his new ramp at home being finished. These are not valid reasons to continue stay in hospital. Have advised staff to have Patient Experience f/u with pt on missing belongings. Pt can be assisted home via [facility] transport either by stretcher transport or door/to/door, per Charge RN pt does not want to go by stretcher and is asking for door to door transport- unit staff will arrange [facility] transport as per pt's request. Per pt his son will help assist him when they get to the home.    Final next level of care: [place] Barriers to Discharge: Continued Medical Work up   Patient Goals and CMS Choice Patient states their goals for this hospitalization and ongoing recovery are:: return home with son and [facility] services CMS Medicare.gov Compare Post Acute Care list provided to:: Patient Choice offered to / list presented to : Patient  Discharge Placement               Home w/ [facility]        Discharge Plan and Services   Discharge Planning Services: CM Consult, Medication Assistance Post Acute Care Choice: Durable Medical Equipment, Home Health            DME Agency: NA       HH Arranged: PT HH Agency: [facility] [employer] [place]: 08/24/20 Time HH Agency  Contacted: 1000 Representative spoke with at [place]: [place]) Interventions     Readmission Risk Interventions Readmission Risk Prevention Plan 08/24/2020 02/01/2020  Transportation Screening Complete Complete  PCP or Specialist Appt within 3-5 Days Complete -  HRI or Home Care Consult Complete -  Social Work Consult for [place] Planning/Counseling Complete -  Palliative Care Screening Not Applicable Not Applicable  Medication Review Press photographer) Complete -  Some recent data might be hidden
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
## 2020-08-24 NOTE — TOC Progression Note (Addendum)
Transition of Care (TOC) - Progression Note  Marvetta Gibbons RN, BSN Transitions of Care Unit 4E- RN Case Manager See Treatment Team for direct phone #   Patient Details  Name: Jason [name] MRN: [id number] Date of Birth: 1965/11/24  Transition of Care [facility]) CM/SW Contact  Dahlia Client, Romeo Rabon, RN Phone Number: 08/24/2020, 10:00 AM  Clinical Narrative:    Noted order for HHPT placed, call made to [facility] as per pt choice, spoke with [facility] regarding HHPT referral- referral has been accepted for HHPT needs. They will contact pt post discharge to set up start of care visit.     Expected Discharge Plan: [place] Barriers to Discharge: Continued Medical Work up  Expected Discharge Plan and Services Expected Discharge Plan: [place]   Discharge Planning Services: CM Consult, Medication Assistance Post Acute Care Choice: Durable Medical Equipment, Home Health Living arrangements for the past 2 months: Single Family Home                   DME Agency: NA       HH Arranged: PT HH Agency: [place] Date [place]: 08/24/20 Time HH Agency Contacted: 1000 Representative spoke with at [place]: [place] ([place]) Interventions    Readmission Risk Interventions Readmission Risk Prevention Plan 02/01/2020  Transportation Screening Complete  Palliative Care Screening Not Applicable  Some recent data might be hidden

## 2020-08-24 NOTE — Telephone Encounter (Signed)
I called pt and he states that he was receiving services  and needs a resumption of care. Message sent to [facility] to advise and to call with any questions.

## 2020-08-24 NOTE — Care Management Important Message (Signed)
Important Message  Patient Details  Name: Jason [name] MRN: [id number] Date of Birth: October 25, 1965   Medicare Important Message Given:  Yes     [name] 08/24/2020, 10:19 AM

## 2020-08-24 NOTE — Progress Notes (Signed)
Discharge instructions (including medications) discussed with and copy provided to patient/caregiver 
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
## 2020-08-25 LAB — CULTURE, BLOOD (ROUTINE X 2): Special Requests: ADEQUATE

## 2020-08-26 ENCOUNTER — Encounter: Payer: Self-pay | Admitting: Orthopedic Surgery
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
## 2020-08-30 ENCOUNTER — Inpatient Hospital Stay: Payer: Medicare Other | Admitting: Orthopedic Surgery

## 2020-08-31 ENCOUNTER — Other Ambulatory Visit (HOSPITAL_COMMUNITY): Payer: Self-pay

## 2020-08-31 ENCOUNTER — Telehealth (HOSPITAL_COMMUNITY): Payer: Self-pay | Admitting: Pharmacist

## 2020-08-31 NOTE — Telephone Encounter (Signed)
Pharmacy Transitions of Care Follow-up Telephone Call  Date of discharge: 08/24/20 Discharge Diagnosis: PE  How have you been since you were released from the hospital? Doing fine  Medication changes made at discharge:  - START: Eliquis and Amoxicillin  - STOPPED: N/A  - CHANGED: N/A  Medication changes verified by the patient?Yes/No)    Medication Accessibility:  Home Pharmacy: [facility]  Was the patient provided with refills on discharged medications? No  Have all prescriptions been transferred from [facility] to home pharmacy? N/A  Is the patient able to afford medications? Yes Notable copays: $9.85/30 day supply Eligible patient assistance: N/A    Medication Review:   APIXABAN (ELIQUIS)  Apixaban 10 mg BID initiated on 6/8. Will switch to apixaban 5 mg BID after 7 days 6/15 (he's currently on 5mg  BID now per pt).  - Discussed importance of taking medication around the same time everyday  -Per pt, he was already educated about Eliquis while in hospital and didn't have any questions. Follow-up Appointments:  [facility] f/u appt confirmed? Scheduled to see Dr. Sharol Given on 09/06/20 @1430   If their condition worsens, is the pt aware to call PCP or go to the Emergency Dept.?   Final Patient Assessment:   -Pt is doing well.  -Pt verbalized understanding of Eliquis  -Pt has post discharge appointment and will ask Dr. Sharol Given for a script for Eliquis.  Garnet Sierras, PharmD

## 2020-09-06 ENCOUNTER — Inpatient Hospital Stay: Payer: Medicare Other | Admitting: Orthopedic Surgery

## 2022-03-25 IMAGING — DX DG CHEST 1V PORT
1 series · 1 of 1 positions shown · non-contrast
Comparison: Chest radiograph dated 03/27/2017

CLINICAL DATA: 53-year-old male with chest pain.

EXAM:
PORTABLE CHEST 1 VIEW

[chest ap]
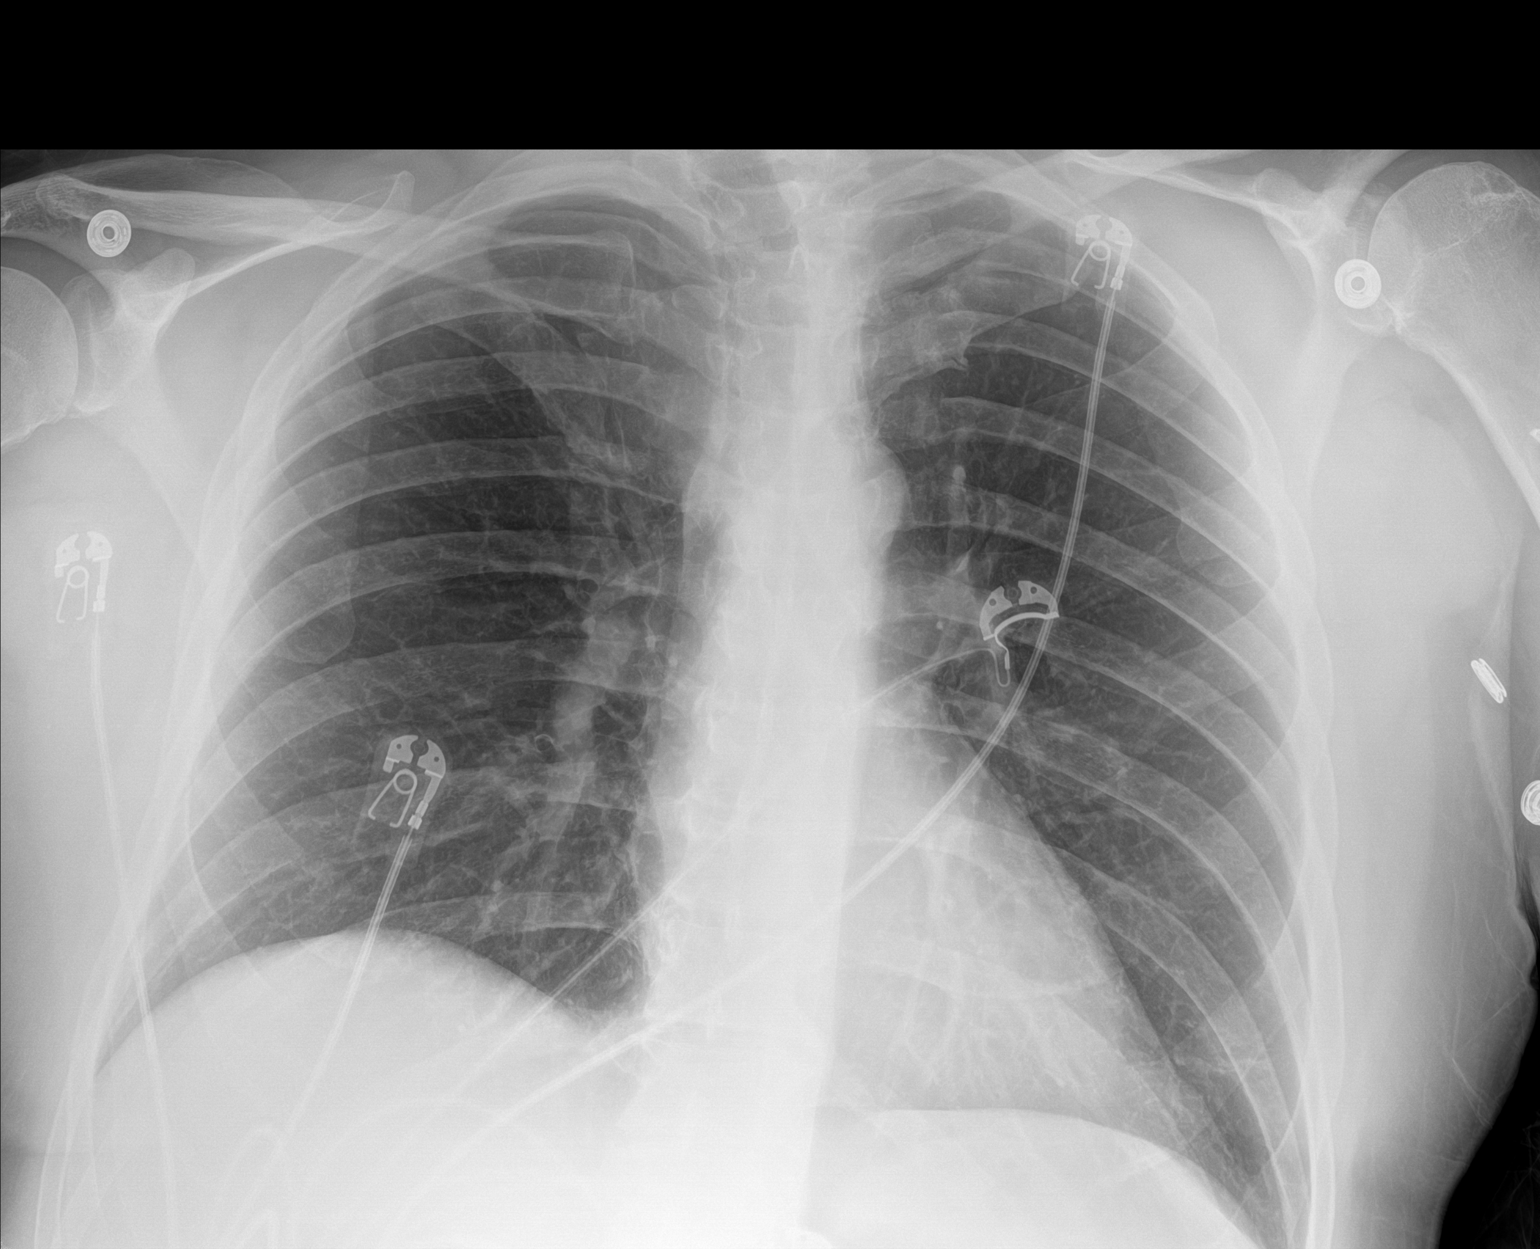

[1 of 1 positions shown; findings below may reference images not displayed]

FINDINGS: The lungs are clear. There is no pleural effusion pneumothorax. The
cardiac silhouette is within limits. No acute osseous pathology.
IMPRESSION: No active disease.

## 2022-06-03 IMAGING — CR DG CHEST 2V
2 series · 2 of 2 positions shown · non-contrast
Comparison: 03/15/2020

CLINICAL DATA: Dyspnea

EXAM:
CHEST - 2 VIEW

[chest lat]
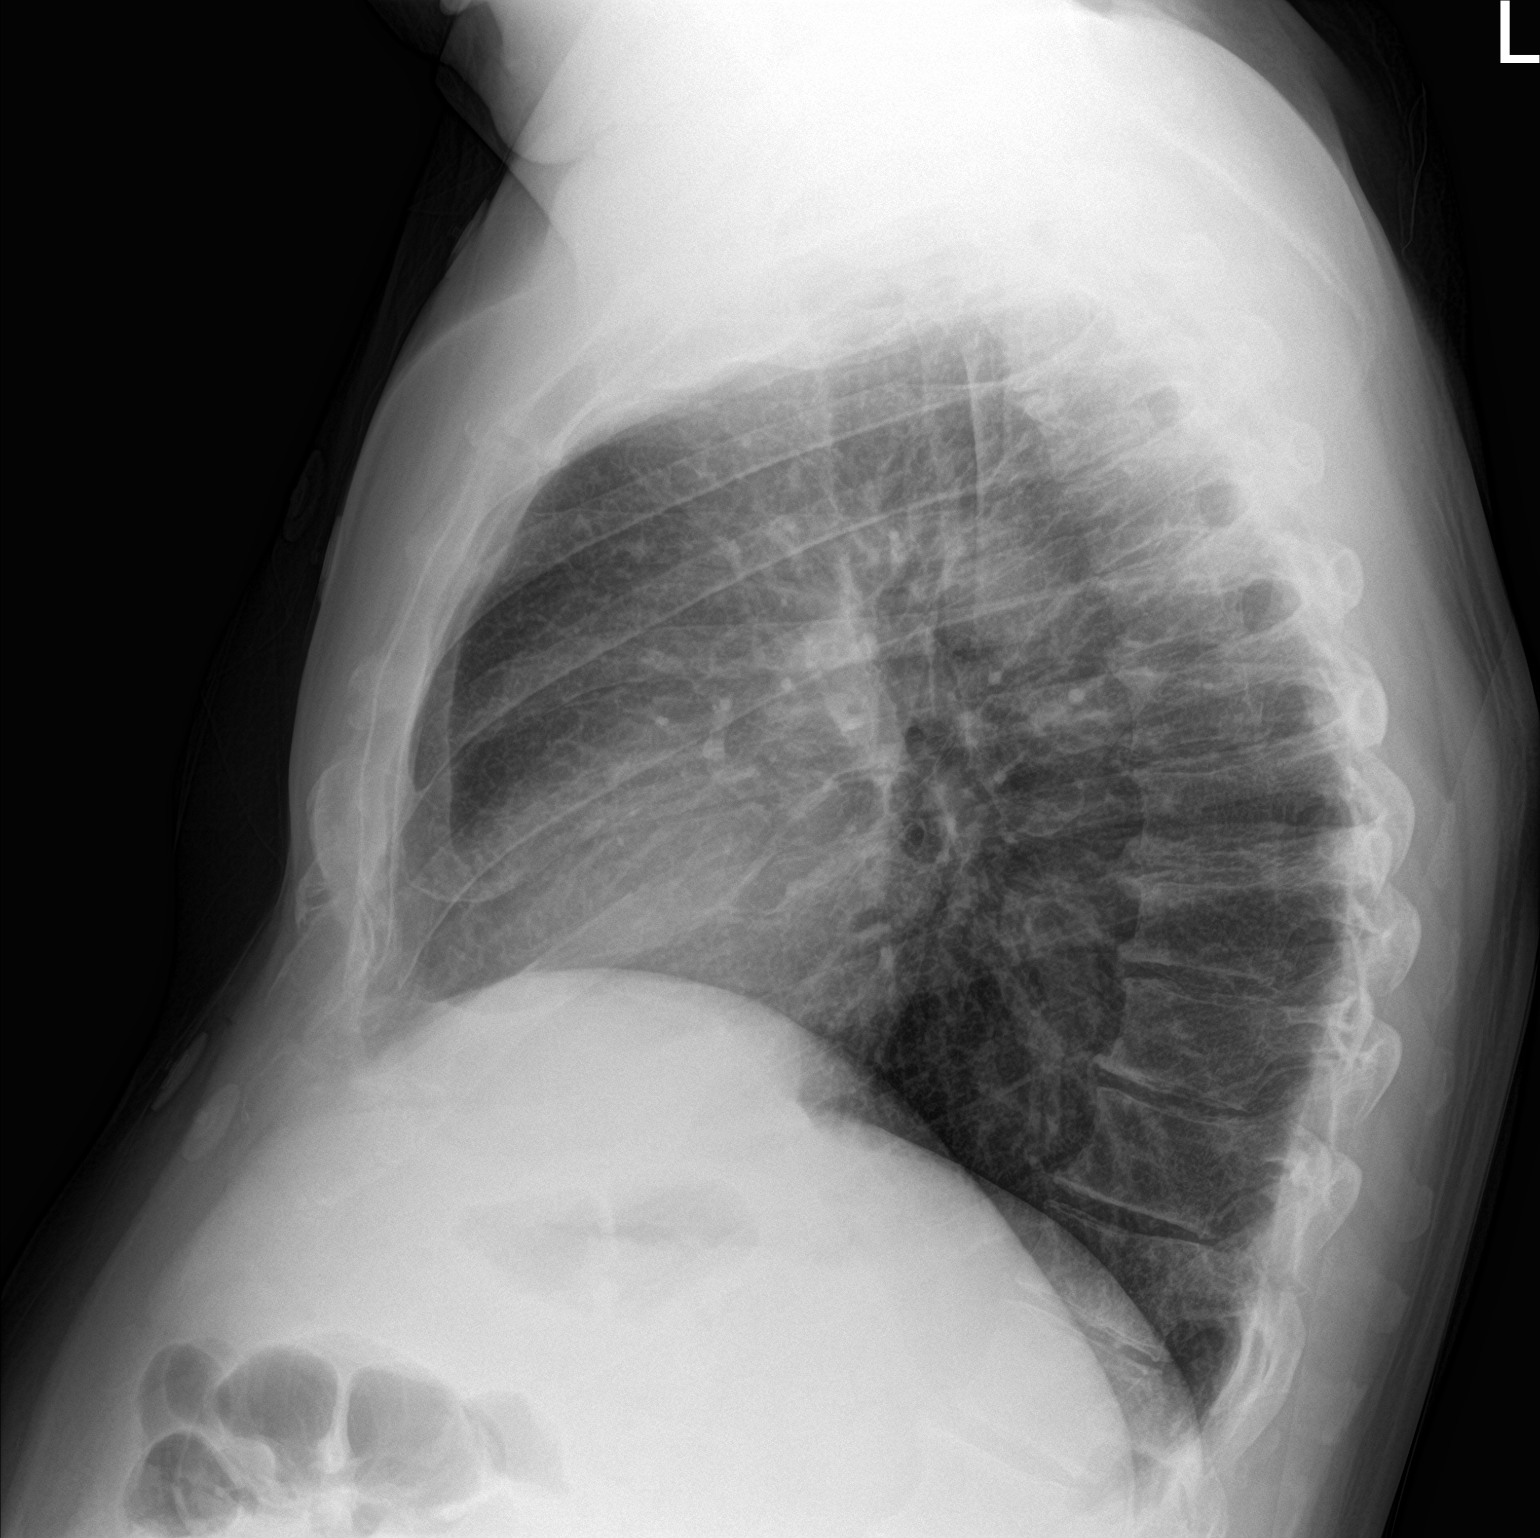

[chest ap]
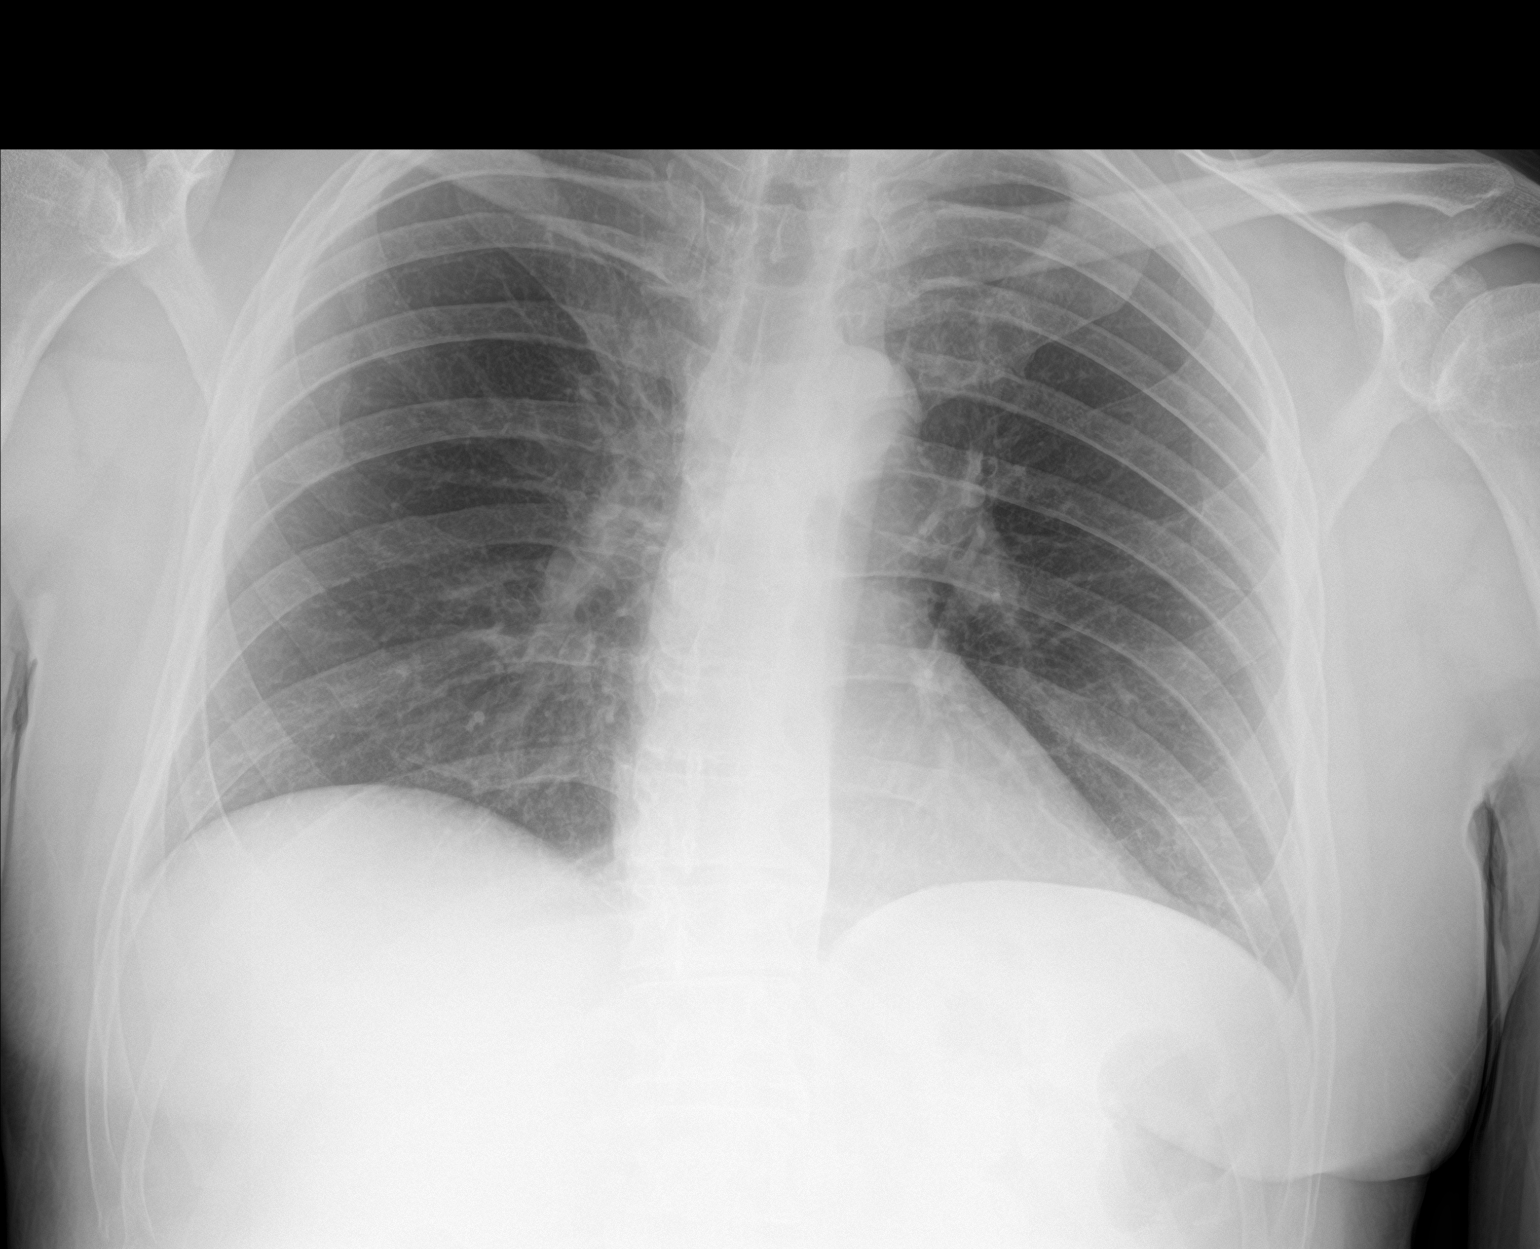

[2 of 2 positions shown; findings below may reference images not displayed]

FINDINGS: Lungs are well expanded, symmetric, and clear. No pneumothorax or
pleural effusion. Cardiac size within normal limits. Pulmonary
vascularity is normal. Osseous structures are age-appropriate. No
acute bone abnormality.
IMPRESSION: No active cardiopulmonary disease.
